# Patient Record
Sex: Female | Born: 1965 | Race: White | Hispanic: No | Marital: Married | Smoking: Never smoker
Health system: Southern US, Community
[De-identification: ages and names within clinical notes are randomized; demographics above are authoritative.]

## PROBLEM LIST (undated history)

## (undated) DIAGNOSIS — Z87442 Personal history of urinary calculi: Secondary | ICD-10-CM

## (undated) DIAGNOSIS — B009 Herpesviral infection, unspecified: Secondary | ICD-10-CM

## (undated) DIAGNOSIS — Z9889 Other specified postprocedural states: Secondary | ICD-10-CM

## (undated) DIAGNOSIS — D649 Anemia, unspecified: Secondary | ICD-10-CM

## (undated) DIAGNOSIS — R112 Nausea with vomiting, unspecified: Secondary | ICD-10-CM

## (undated) DIAGNOSIS — T7840XA Allergy, unspecified, initial encounter: Secondary | ICD-10-CM

## (undated) DIAGNOSIS — N6092 Unspecified benign mammary dysplasia of left breast: Secondary | ICD-10-CM

## (undated) HISTORY — PX: TUBAL LIGATION: SHX77

## (undated) HISTORY — PX: EYE SURGERY: SHX253

## (undated) HISTORY — DX: Allergy, unspecified, initial encounter: T78.40XA

## (undated) HISTORY — DX: Herpesviral infection, unspecified: B00.9

---

## 2001-11-13 ENCOUNTER — Ambulatory Visit (HOSPITAL_COMMUNITY): Admission: RE | Admit: 2001-11-13 | Discharge: 2001-11-13 | Payer: Self-pay | Admitting: Neurosurgery

## 2001-11-13 ENCOUNTER — Encounter: Payer: Self-pay | Admitting: Neurosurgery

## 2016-02-24 ENCOUNTER — Other Ambulatory Visit: Payer: Self-pay | Admitting: Obstetrics and Gynecology

## 2016-02-24 DIAGNOSIS — R928 Other abnormal and inconclusive findings on diagnostic imaging of breast: Secondary | ICD-10-CM

## 2016-02-27 ENCOUNTER — Other Ambulatory Visit: Payer: Self-pay | Admitting: Obstetrics and Gynecology

## 2016-02-27 ENCOUNTER — Ambulatory Visit
Admission: RE | Admit: 2016-02-27 | Discharge: 2016-02-27 | Disposition: A | Discharge status: Home / Self Care | Payer: 59 | Source: Ambulatory Visit | Attending: Obstetrics and Gynecology | Admitting: Obstetrics and Gynecology

## 2016-02-27 DIAGNOSIS — R928 Other abnormal and inconclusive findings on diagnostic imaging of breast: Secondary | ICD-10-CM

## 2016-02-27 IMAGING — MG DIGITAL DIAGNOSTIC UNILATERAL LEFT MAMMOGRAM
4 series · 4 of 4 positions shown · non-contrast
Comparison: Previous exam(s).

CLINICAL DATA: Left breast calcifications seen on most recent
screening mammography.

EXAM:
DIGITAL DIAGNOSTIC LEFT MAMMOGRAM WITH CAD

[L ML (1 of 3)]
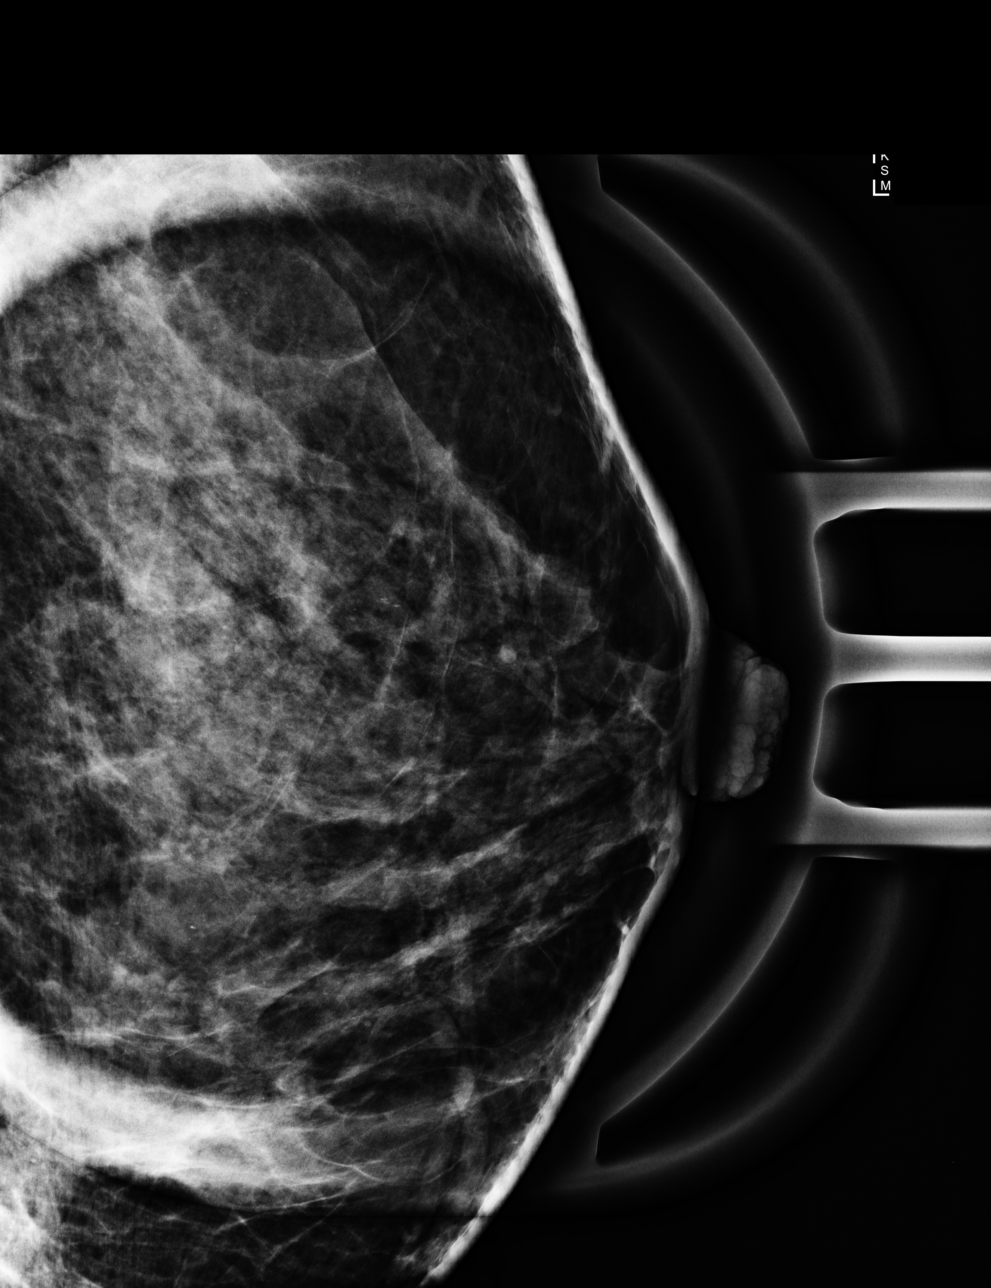

[L ML (2 of 3)]
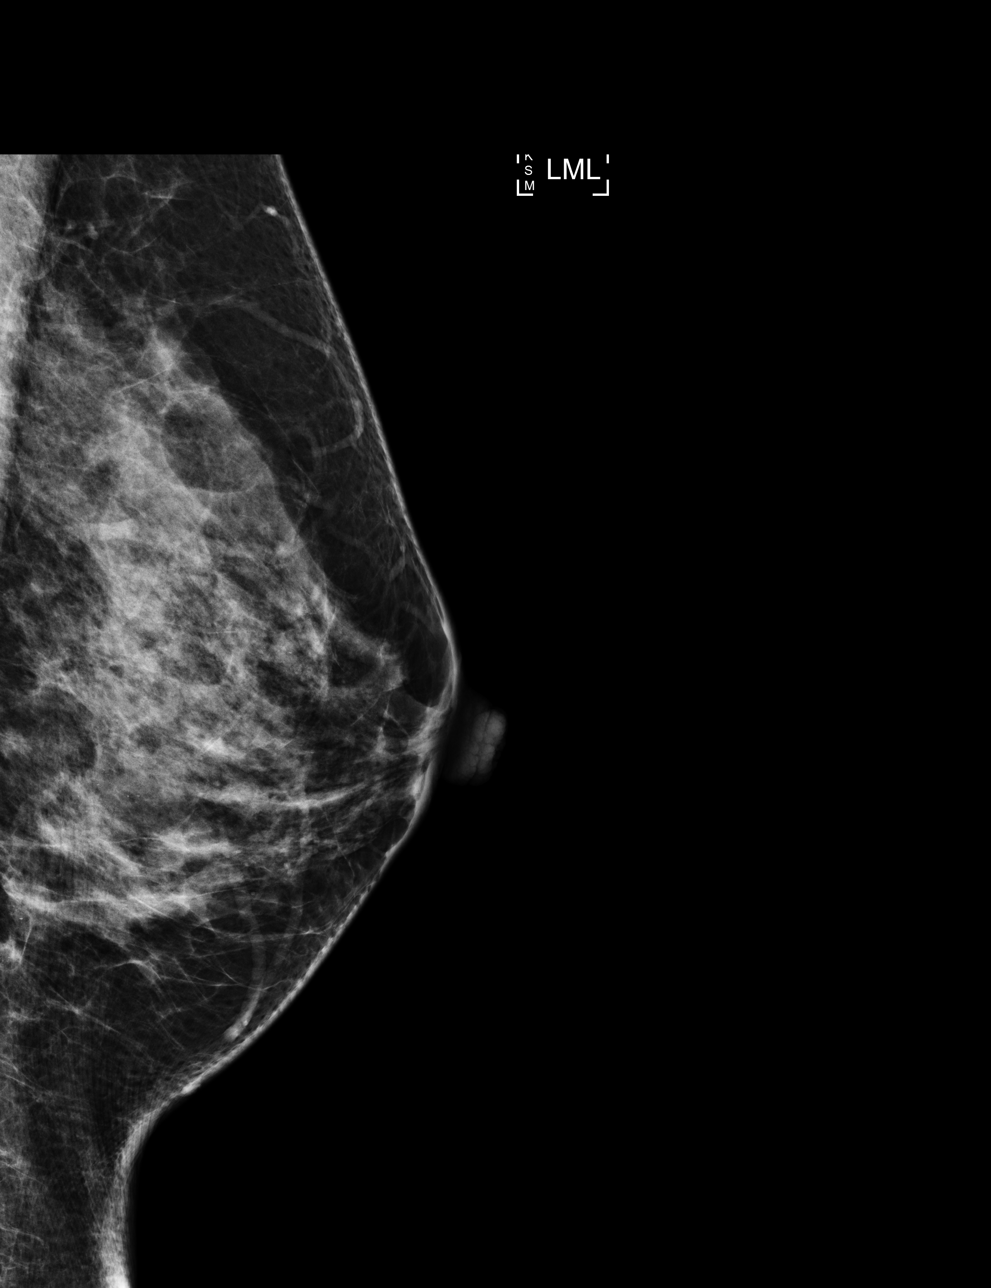

[L ML (3 of 3)]
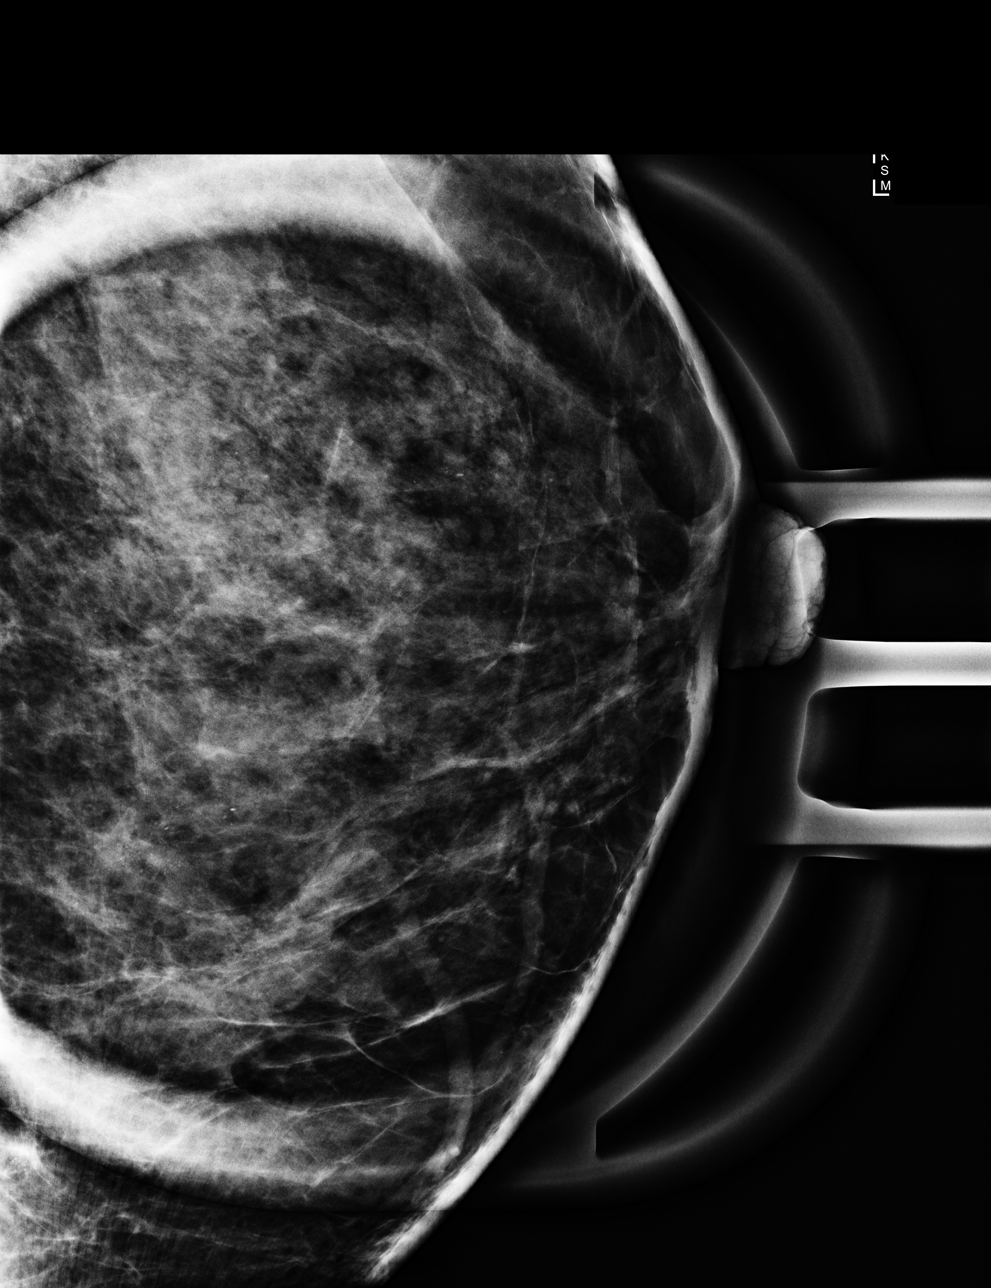

[L CC]
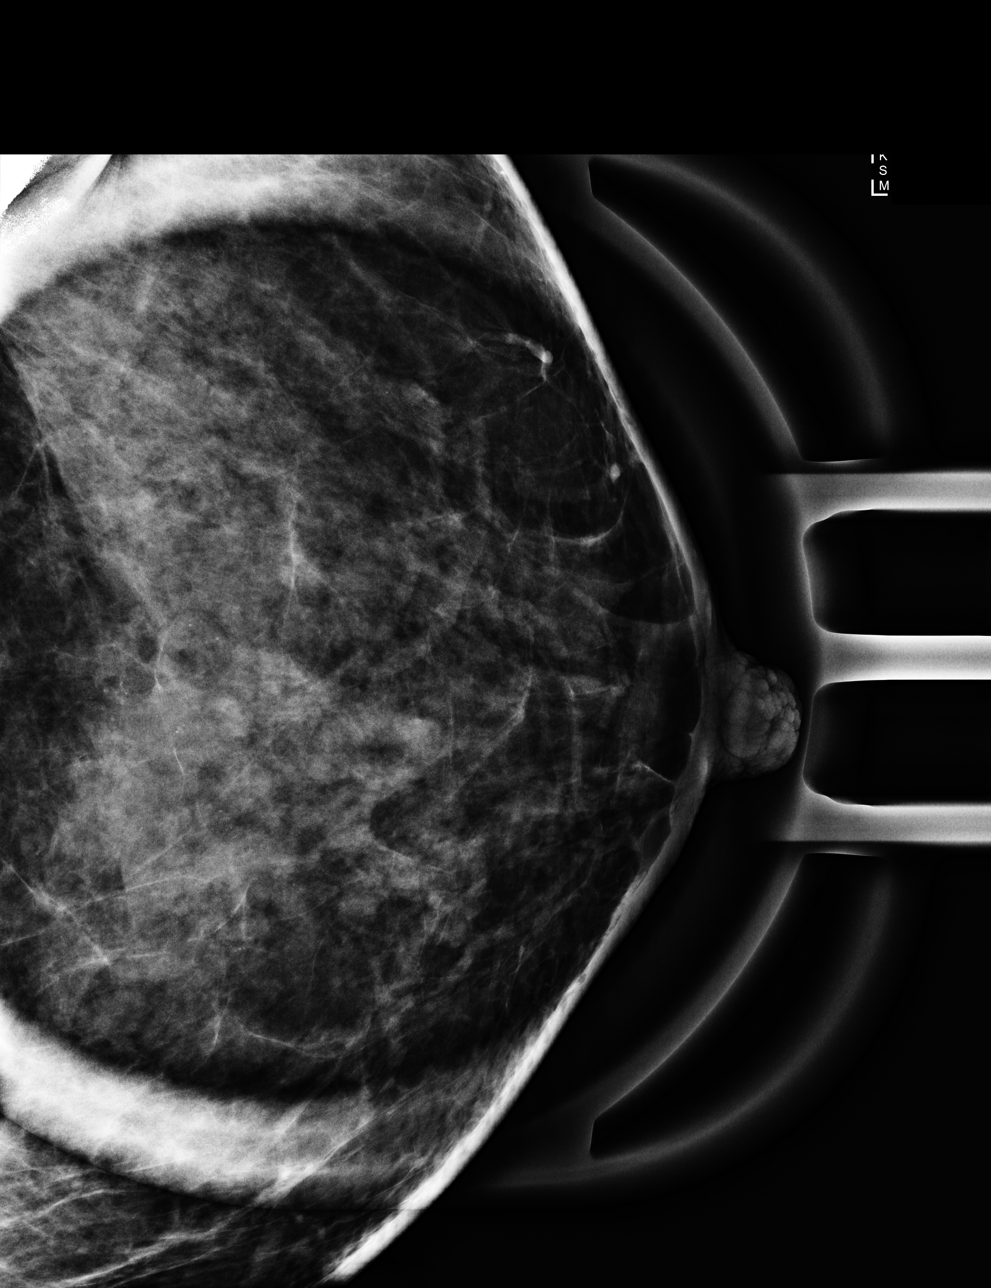

[4 of 4 positions shown; findings below may reference images not displayed]

ACR Breast Density Category c: The breast tissue is heterogeneously
dense, which may obscure small masses.
FINDINGS: Additional compression magnification views of the left breast
demonstrate a group of amorphous slightly pleomorphic calcifications
in the left lower central breast, posterior depth, which measures
2.6 by 2.6 by 2.5 cm. No associated mass is seen.

Mammographic images were processed with CAD.
IMPRESSION: Left lower central breast group of indeterminate calcifications, for
which stereotactic core needle biopsy is recommended.

RECOMMENDATION:
Stereotactic core needle biopsy of the left breast.

I have discussed the findings and recommendations with the patient.
Results were also provided in writing at the conclusion of the
visit. If applicable, a reminder letter will be sent to the patient
regarding the next appointment.

BI-RADS CATEGORY  4: Suspicious.

## 2016-03-02 ENCOUNTER — Ambulatory Visit
Admission: RE | Admit: 2016-03-02 | Discharge: 2016-03-02 | Disposition: A | Discharge status: Home / Self Care | Payer: 59 | Source: Ambulatory Visit | Attending: Obstetrics and Gynecology | Admitting: Obstetrics and Gynecology

## 2016-03-02 ENCOUNTER — Other Ambulatory Visit: Payer: Self-pay | Admitting: Obstetrics and Gynecology

## 2016-03-02 DIAGNOSIS — R928 Other abnormal and inconclusive findings on diagnostic imaging of breast: Secondary | ICD-10-CM

## 2016-03-02 DIAGNOSIS — R921 Mammographic calcification found on diagnostic imaging of breast: Secondary | ICD-10-CM

## 2016-03-02 IMAGING — MG STEREOTACTIC CORE NEEDLE BIOPSY
8 series · 8 of 16 positions shown · non-contrast
Comparison: Previous exams.

ADDENDUM:
Pathology revealed ATYPICAL LOBULAR HYPERPLASIA, FLAT EPITHELIAL
ATYPIA WITH ASSOCIATED CALCIFICATIONS, FIBROCYSTIC CHANGES WITH
ASSOCIATED CALCIFICATIONS of the Left breast, lower inner. This was
found to be concordant by Dr. SRETENA, with excision
recommended. Pathology results were discussed with the patient by
telephone. The patient reported doing well after the biopsy with
tenderness at the site. Post biopsy instructions and care were
reviewed and questions were answered. The patient was encouraged to
call The [REDACTED] for any additional
concerns. Surgical consultation has been arranged with Dr. SRETENA
at [REDACTED] on [DATE].

Pathology results reported by SRETENA, RN on [DATE].
CLINICAL DATA: 50-year-old female with indeterminate left breast
calcifications.
EXAM:
BREAST STEREOTACTIC CORE NEEDLE BIOPSY

[L (1 of 5)]
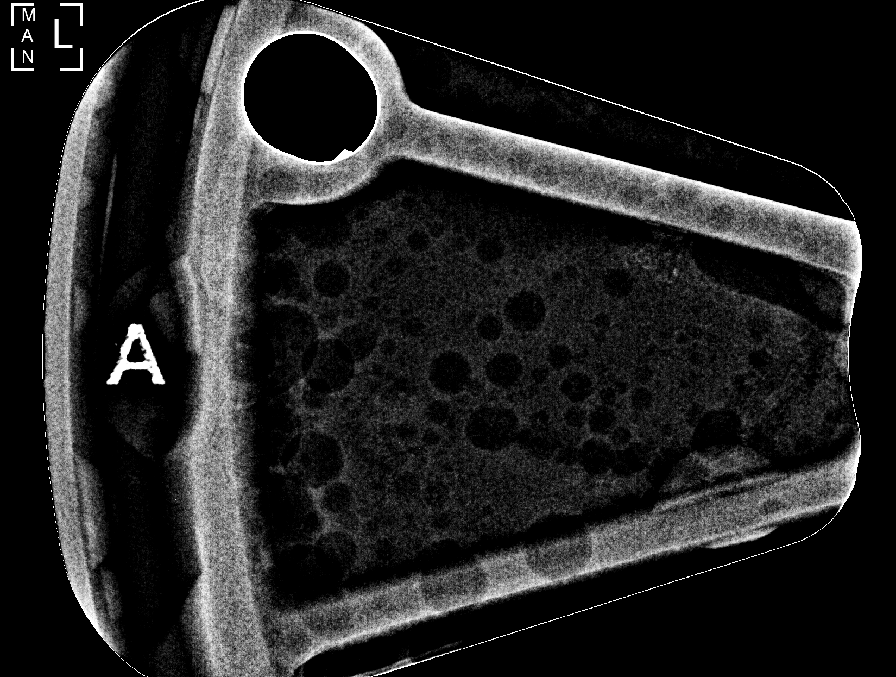

[L (2 of 5)]
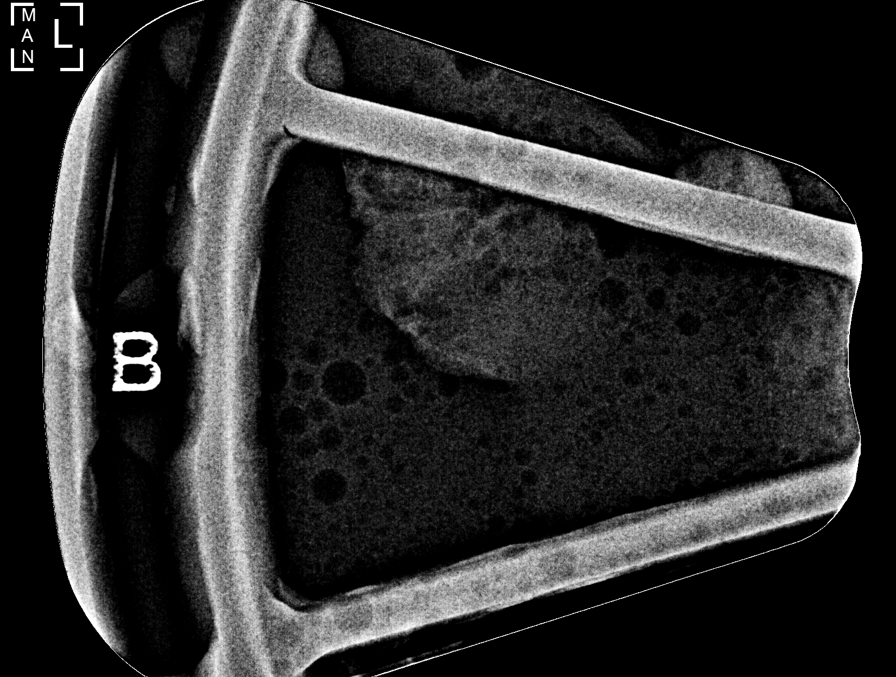

[L (3 of 5)]
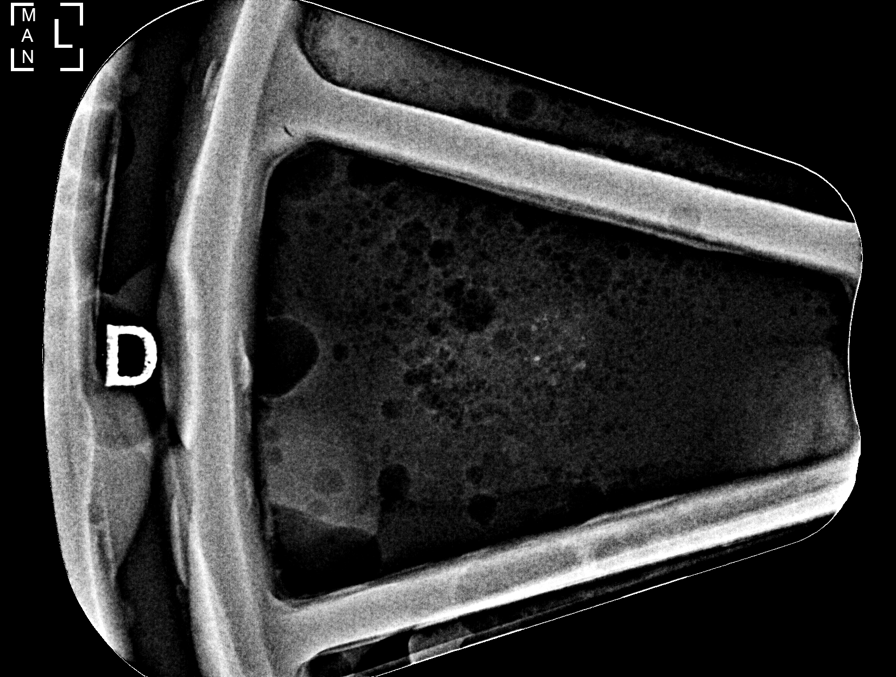

[L (4 of 5)]
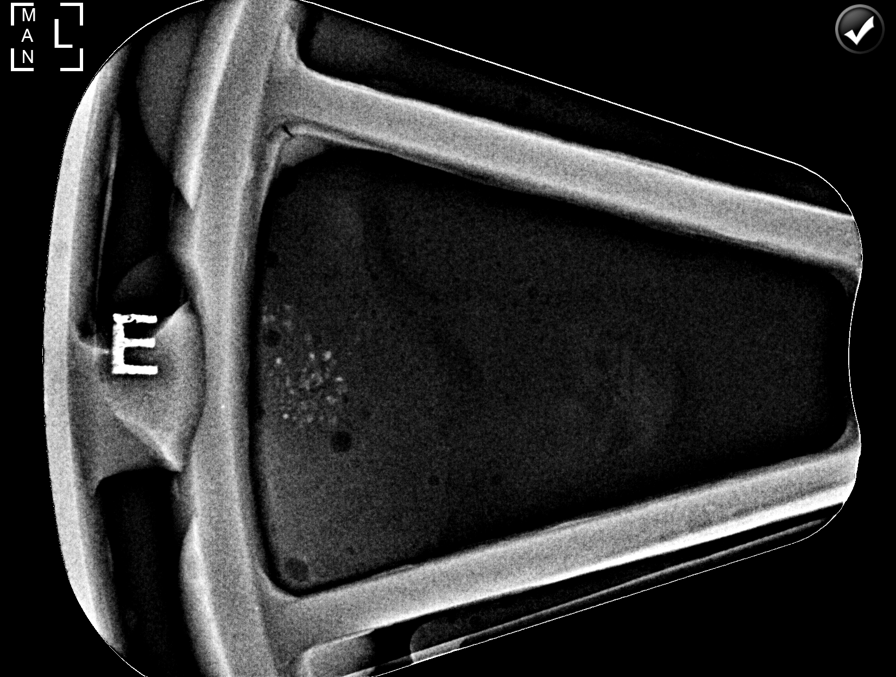

[L (5 of 5)]
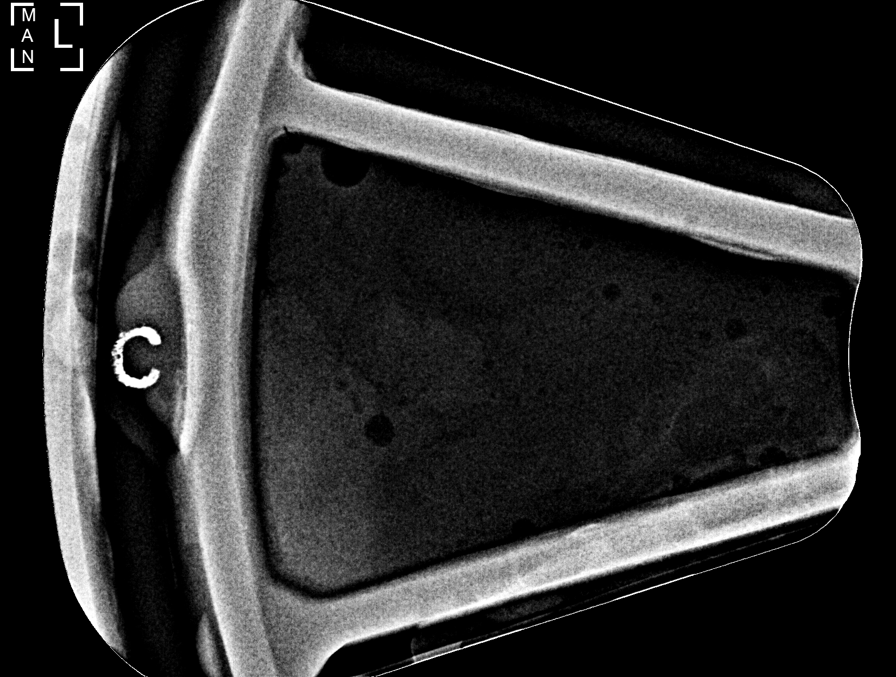

[L ML]
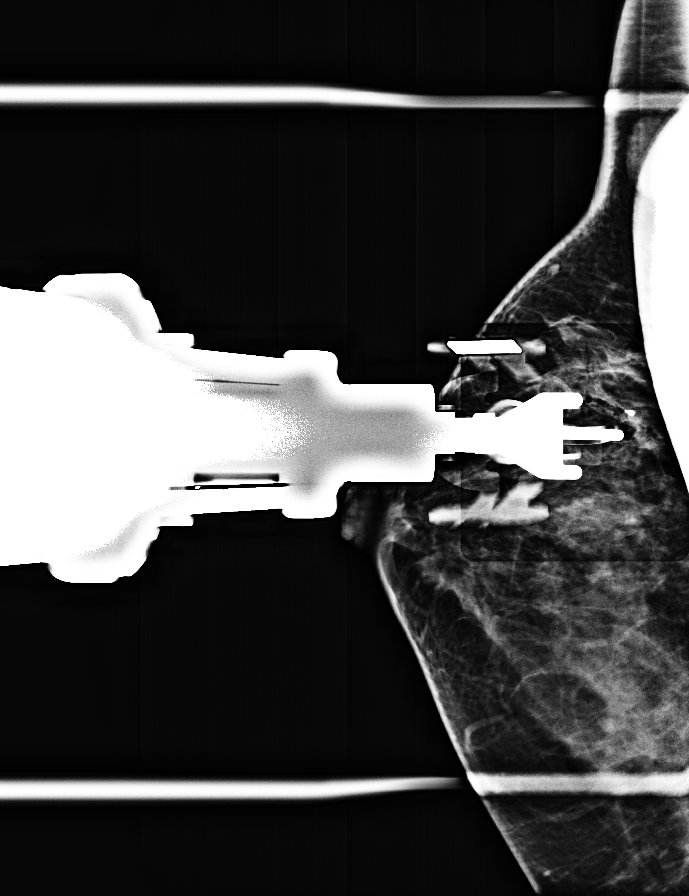

[L ML tomo (1 of 2) · tomo slice 35/68.0]
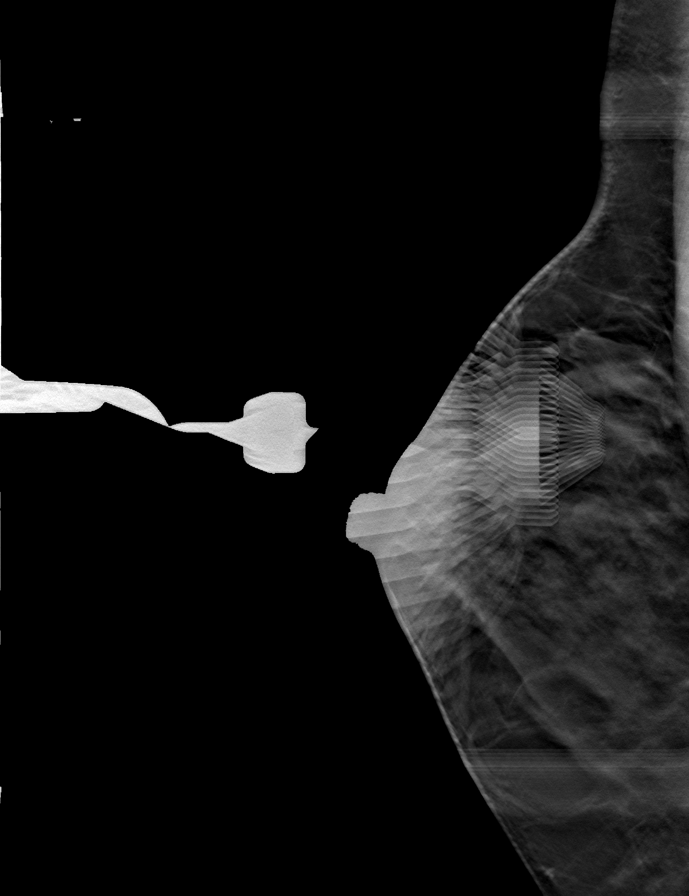

[L ML tomo (2 of 2) · tomo slice 35/69.0]
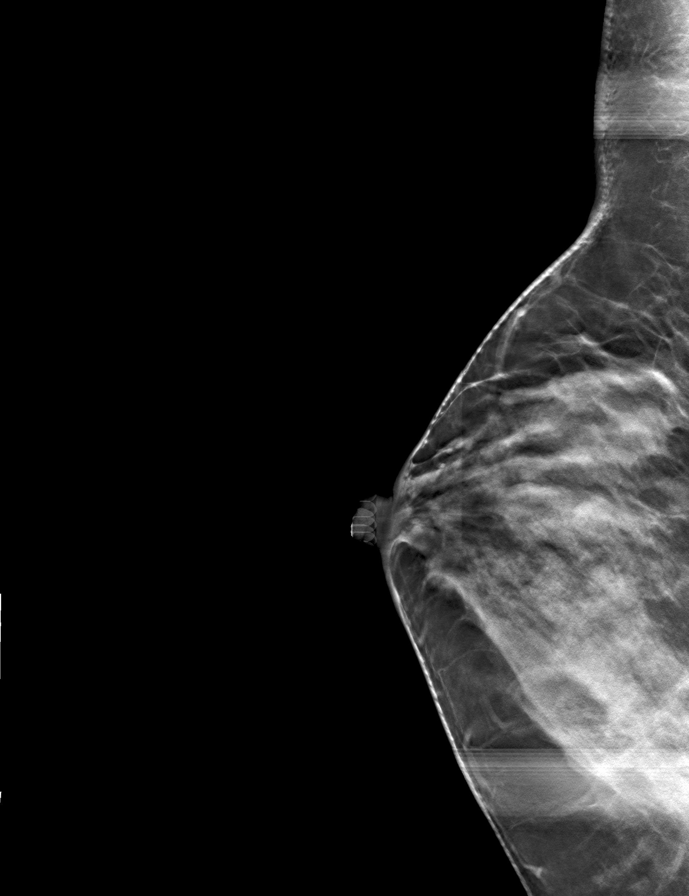

[8 of 16 positions shown; findings below may reference images not displayed]



Using sterile technique and 1% Lidocaine as local anesthetic, under
stereotactic guidance, a 9 gauge vacuum device was used to perform
core needle biopsy of the calcifications in the lower to slightly
inner left breast using a medial to lateral approach. Specimen
radiograph was performed showing the presence of calcifications.
Specimens with calcifications are identified for pathology.

At the conclusion of the procedure, a coil shaped tissue marker clip
was deployed into the biopsy cavity. Follow-up 2-view mammogram was
performed and dictated separately.
IMPRESSION: Stereotactic-guided biopsy of calcifications in the lower slightly
inner left breast. No apparent complications.

## 2016-03-10 ENCOUNTER — Ambulatory Visit: Payer: Self-pay | Admitting: General Surgery

## 2016-03-10 DIAGNOSIS — N6092 Unspecified benign mammary dysplasia of left breast: Secondary | ICD-10-CM

## 2016-03-15 ENCOUNTER — Other Ambulatory Visit: Payer: Self-pay | Admitting: General Surgery

## 2016-03-15 DIAGNOSIS — N6092 Unspecified benign mammary dysplasia of left breast: Secondary | ICD-10-CM

## 2016-03-16 ENCOUNTER — Encounter (HOSPITAL_BASED_OUTPATIENT_CLINIC_OR_DEPARTMENT_OTHER): Payer: Self-pay | Admitting: *Deleted

## 2016-03-19 ENCOUNTER — Ambulatory Visit
Admission: RE | Admit: 2016-03-19 | Discharge: 2016-03-19 | Disposition: A | Discharge status: Home / Self Care | Payer: 59 | Source: Ambulatory Visit | Attending: General Surgery | Admitting: General Surgery

## 2016-03-19 DIAGNOSIS — N6092 Unspecified benign mammary dysplasia of left breast: Secondary | ICD-10-CM

## 2016-03-19 IMAGING — MG NEEDLE LOCALIZATION OF THE LEFT BREAST WITH MAMMO GUIDANCE
7 series · 7 of 7 positions shown · non-contrast
Comparison: Previous exam(s).

CLINICAL DATA: Patient presents for radioactive seed localization
of ALH/NUUDHANO lower central left breast prior to surgical excision.
Surgery scheduled for [DATE].

EXAM:
MAMMOGRAPHIC GUIDED RADIOACTIVE SEED LOCALIZATION OF THE LEFT BREAST

[L LM (1 of 4)]
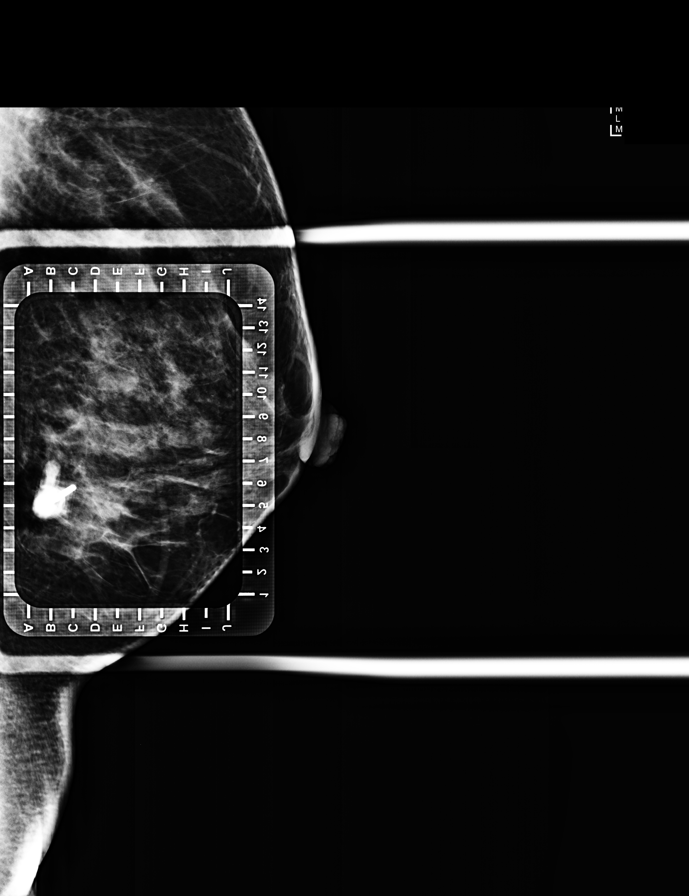

[L LM (2 of 4)]
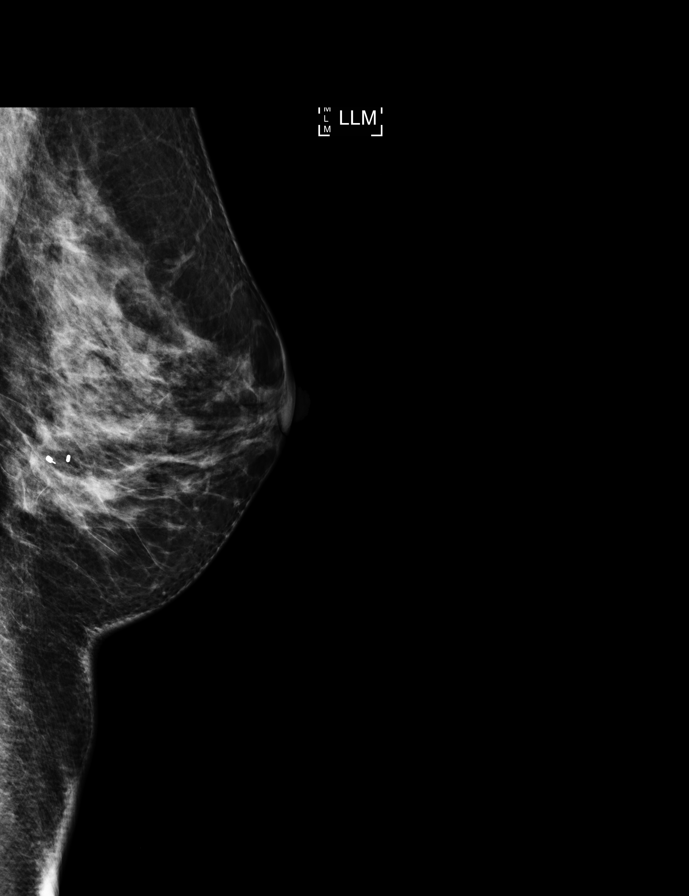

[L CC (1 of 3)]
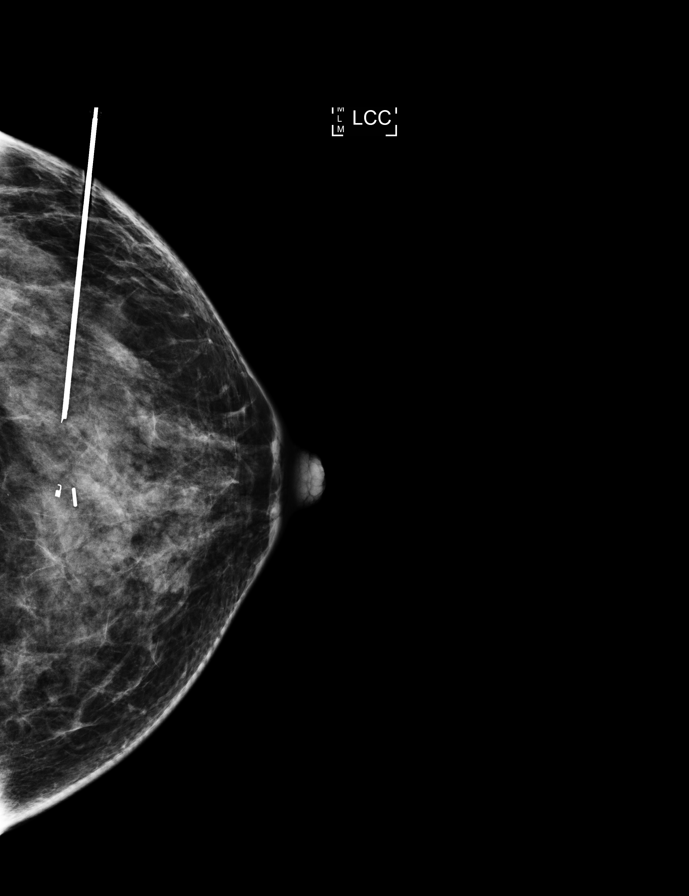

[L LM (3 of 4)]
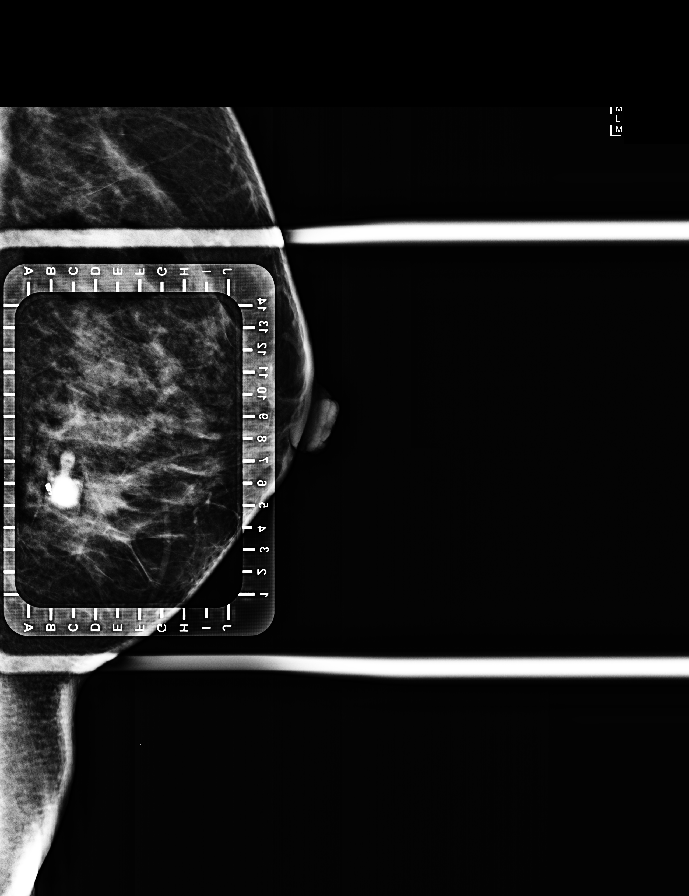

[L LM (4 of 4)]
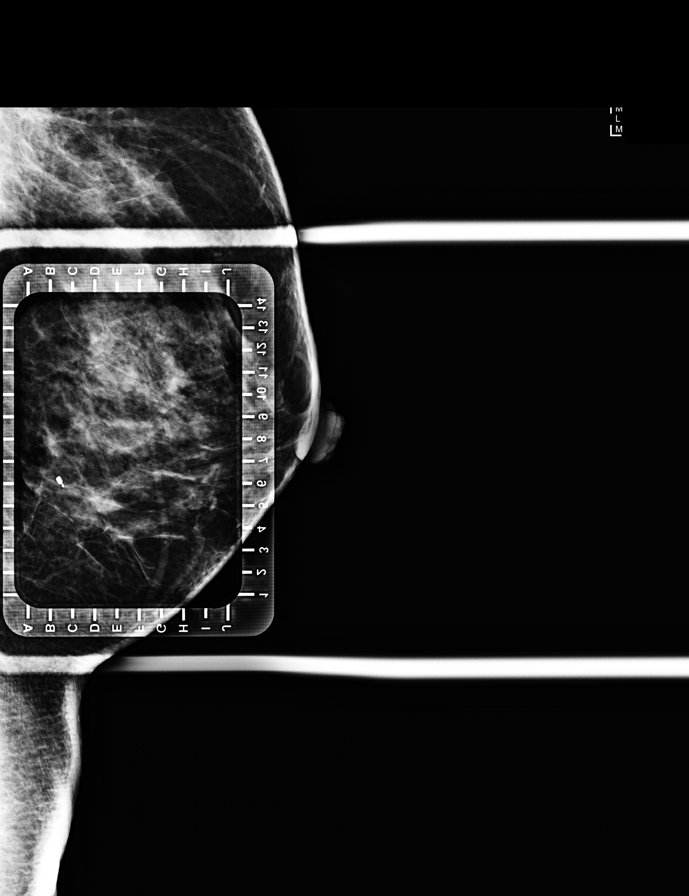

[L CC (2 of 3)]
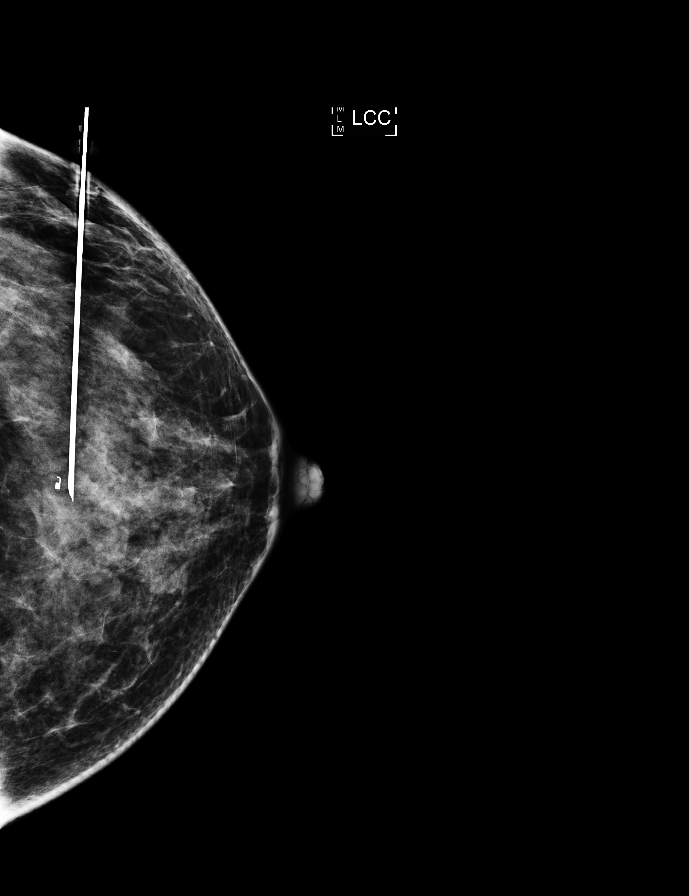

[L CC (3 of 3)]
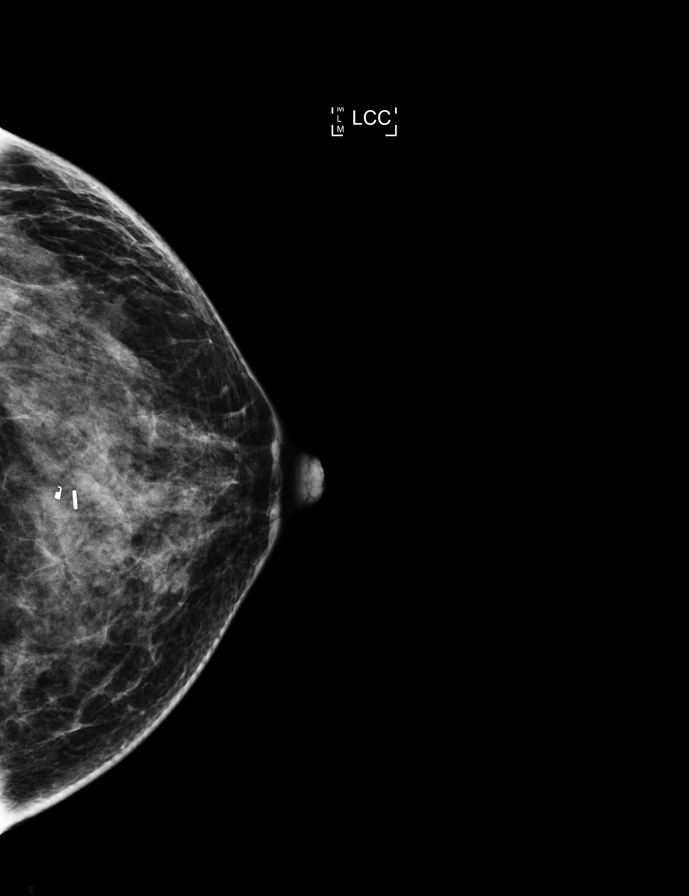

[7 of 7 positions shown; findings below may reference images not displayed]

FINDINGS: Patient presents for radioactive seed localization prior to surgical
excision. I met with the patient and we discussed the procedure of
seed localization including benefits and alternatives. We discussed
the high likelihood of a successful procedure. We discussed the
risks of the procedure including infection, bleeding, tissue injury
and further surgery. We discussed the low dose of radioactivity
involved in the procedure. Informed, written consent was given.

The usual time-out protocol was performed immediately prior to the
procedure.

Using mammographic guidance, sterile technique, 1% lidocaine and an
[LI] radioactive seed, the targeted post biopsy clip was localized
using a lateral to medial approach. The follow-up mammogram images
confirm the seed in the expected location and were marked for Dr.
NUUDHANO. The radioactive seed lies immediately anterior to the targeted
clip.

Follow-up survey of the patient confirms presence of the radioactive
seed.

Order number of [LI] seed:  [PHONE_NUMBER].

Total activity:  0.248 millicuries  Reference Date: [DATE]

The patient tolerated the procedure well and was released from the
[REDACTED]. She was given instructions regarding seed removal.
IMPRESSION: Radioactive seed localization left breast. No apparent
complications.

## 2016-03-19 NOTE — Progress Notes (Signed)
Pt instructed to drink Boost before 0815 day of surgery with teach back method.

## 2016-03-22 ENCOUNTER — Ambulatory Visit (HOSPITAL_BASED_OUTPATIENT_CLINIC_OR_DEPARTMENT_OTHER): Payer: 59 | Admitting: Certified Registered"

## 2016-03-22 ENCOUNTER — Ambulatory Visit (HOSPITAL_BASED_OUTPATIENT_CLINIC_OR_DEPARTMENT_OTHER)
Admission: RE | Admit: 2016-03-22 | Discharge: 2016-03-22 | Disposition: A | Discharge status: Home / Self Care | Payer: 59 | Source: Ambulatory Visit | Attending: General Surgery | Admitting: General Surgery

## 2016-03-22 ENCOUNTER — Ambulatory Visit
Admission: RE | Admit: 2016-03-22 | Discharge: 2016-03-22 | Disposition: A | Discharge status: Home / Self Care | Payer: 59 | Source: Ambulatory Visit | Attending: General Surgery | Admitting: General Surgery

## 2016-03-22 ENCOUNTER — Encounter (HOSPITAL_BASED_OUTPATIENT_CLINIC_OR_DEPARTMENT_OTHER)
Admission: RE | Disposition: A | Discharge status: Home / Self Care | Payer: Self-pay | Source: Ambulatory Visit | Attending: General Surgery

## 2016-03-22 ENCOUNTER — Encounter (HOSPITAL_BASED_OUTPATIENT_CLINIC_OR_DEPARTMENT_OTHER): Payer: Self-pay

## 2016-03-22 DIAGNOSIS — Z803 Family history of malignant neoplasm of breast: Secondary | ICD-10-CM | POA: Diagnosis not present

## 2016-03-22 DIAGNOSIS — N6092 Unspecified benign mammary dysplasia of left breast: Secondary | ICD-10-CM | POA: Diagnosis present

## 2016-03-22 DIAGNOSIS — Z79899 Other long term (current) drug therapy: Secondary | ICD-10-CM | POA: Diagnosis not present

## 2016-03-22 HISTORY — DX: Unspecified benign mammary dysplasia of left breast: N60.92

## 2016-03-22 HISTORY — PX: BREAST LUMPECTOMY WITH RADIOACTIVE SEED LOCALIZATION: SHX6424

## 2016-03-22 HISTORY — DX: Anemia, unspecified: D64.9

## 2016-03-22 HISTORY — PX: BREAST CYST EXCISION: SHX579

## 2016-03-22 LAB — POCT HEMOGLOBIN-HEMACUE: Hemoglobin: 12.5 g/dL (ref 12.0–15.0)

## 2016-03-22 IMAGING — MG BREAST SURGICAL SPECIMEN
1 series · 1 of 1 positions shown · non-contrast
Comparison: Previous exam(s).

CLINICAL DATA: Patient status post left breast excisional biopsy.

EXAM:
SPECIMEN RADIOGRAPH OF THE LEFT BREAST

[L]
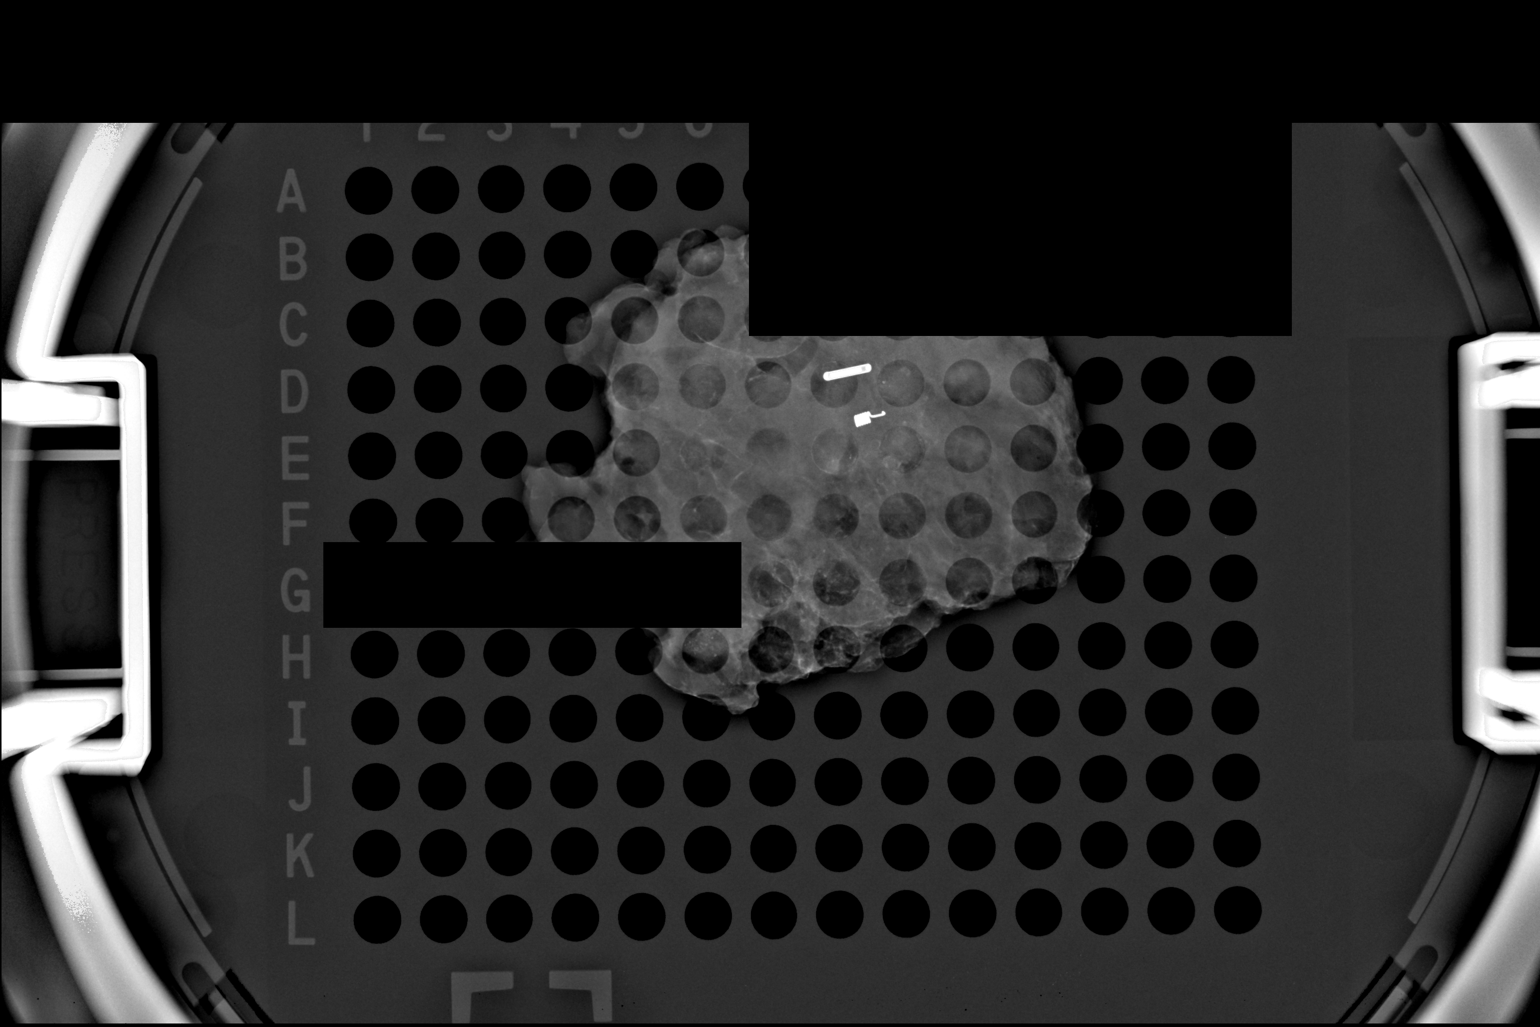

[1 of 1 positions shown; findings below may reference images not displayed]

FINDINGS: Status post excision of the left breast. The radioactive seed and
biopsy marker clip are present, completely intact, and were marked
for pathology.
IMPRESSION: Specimen radiograph of the left breast.

## 2016-03-22 SURGERY — BREAST LUMPECTOMY WITH RADIOACTIVE SEED LOCALIZATION
Anesthesia: General | Site: Breast | Laterality: Left

## 2016-03-22 MED ORDER — CEFAZOLIN SODIUM-DEXTROSE 2-4 GM/100ML-% IV SOLN
2.0000 g | INTRAVENOUS | Status: AC
  Administered 2016-03-22: 2 g via INTRAVENOUS

## 2016-03-22 MED ORDER — ONDANSETRON HCL 4 MG/2ML IJ SOLN
INTRAMUSCULAR | Status: DC | PRN
  Administered 2016-03-22: 4 mg via INTRAVENOUS

## 2016-03-22 MED ORDER — OXYCODONE HCL 5 MG PO TABS: 5.0000 mg | ORAL_TABLET | Freq: Once | ORAL | Status: DC | PRN

## 2016-03-22 MED ORDER — GABAPENTIN 300 MG PO CAPS
300.0000 mg | ORAL_CAPSULE | ORAL | Status: AC
  Administered 2016-03-22: 300 mg via ORAL

## 2016-03-22 MED ORDER — MEPERIDINE HCL 25 MG/ML IJ SOLN: 6.2500 mg | INTRAMUSCULAR | Status: DC | PRN

## 2016-03-22 MED ORDER — SCOPOLAMINE 1 MG/3DAYS TD PT72
MEDICATED_PATCH | TRANSDERMAL | Status: AC
  Filled 2016-03-22: qty 1

## 2016-03-22 MED ORDER — PROPOFOL 10 MG/ML IV BOLUS
INTRAVENOUS | Status: AC
  Filled 2016-03-22: qty 20

## 2016-03-22 MED ORDER — ONDANSETRON HCL 4 MG/2ML IJ SOLN
INTRAMUSCULAR | Status: AC
  Filled 2016-03-22: qty 16

## 2016-03-22 MED ORDER — FENTANYL CITRATE (PF) 100 MCG/2ML IJ SOLN
INTRAMUSCULAR | Status: AC
  Filled 2016-03-22: qty 2

## 2016-03-22 MED ORDER — LIDOCAINE 2% (20 MG/ML) 5 ML SYRINGE
INTRAMUSCULAR | Status: AC
  Filled 2016-03-22: qty 20

## 2016-03-22 MED ORDER — MIDAZOLAM HCL 2 MG/2ML IJ SOLN
INTRAMUSCULAR | Status: AC
  Filled 2016-03-22: qty 2

## 2016-03-22 MED ORDER — OXYCODONE HCL 5 MG/5ML PO SOLN: 5.0000 mg | Freq: Once | ORAL | Status: DC | PRN

## 2016-03-22 MED ORDER — PROMETHAZINE HCL 25 MG/ML IJ SOLN: 6.2500 mg | INTRAMUSCULAR | Status: DC | PRN

## 2016-03-22 MED ORDER — HYDROMORPHONE HCL 1 MG/ML IJ SOLN
0.2500 mg | INTRAMUSCULAR | Status: DC | PRN
  Administered 2016-03-22 (×2): 0.5 mg via INTRAVENOUS

## 2016-03-22 MED ORDER — HYDROCODONE-ACETAMINOPHEN 5-325 MG PO TABS: 1.0000 | ORAL_TABLET | ORAL | 0 refills | Status: DC | PRN

## 2016-03-22 MED ORDER — HYDROMORPHONE HCL 1 MG/ML IJ SOLN
INTRAMUSCULAR | Status: AC
  Filled 2016-03-22: qty 1

## 2016-03-22 MED ORDER — CEFAZOLIN SODIUM-DEXTROSE 2-4 GM/100ML-% IV SOLN
INTRAVENOUS | Status: AC
  Filled 2016-03-22: qty 100

## 2016-03-22 MED ORDER — SCOPOLAMINE 1 MG/3DAYS TD PT72
1.0000 | MEDICATED_PATCH | Freq: Once | TRANSDERMAL | Status: DC | PRN
  Administered 2016-03-22: 1.5 mg via TRANSDERMAL

## 2016-03-22 MED ORDER — DEXAMETHASONE SODIUM PHOSPHATE 4 MG/ML IJ SOLN
INTRAMUSCULAR | Status: DC | PRN
  Administered 2016-03-22: 10 mg via INTRAVENOUS

## 2016-03-22 MED ORDER — GABAPENTIN 300 MG PO CAPS
ORAL_CAPSULE | ORAL | Status: AC
  Filled 2016-03-22: qty 1

## 2016-03-22 MED ORDER — PROPOFOL 500 MG/50ML IV EMUL
INTRAVENOUS | Status: AC
  Filled 2016-03-22: qty 50

## 2016-03-22 MED ORDER — PROPOFOL 10 MG/ML IV BOLUS
INTRAVENOUS | Status: DC | PRN
  Administered 2016-03-22: 200 mg via INTRAVENOUS

## 2016-03-22 MED ORDER — BUPIVACAINE HCL (PF) 0.25 % IJ SOLN
INTRAMUSCULAR | Status: DC | PRN
  Administered 2016-03-22 (×2): 10 mL

## 2016-03-22 MED ORDER — LACTATED RINGERS IV SOLN
INTRAVENOUS | Status: DC
  Administered 2016-03-22 (×3): via INTRAVENOUS

## 2016-03-22 MED ORDER — CHLORHEXIDINE GLUCONATE CLOTH 2 % EX PADS: 6.0000 | MEDICATED_PAD | Freq: Once | CUTANEOUS | Status: DC

## 2016-03-22 MED ORDER — ACETAMINOPHEN 500 MG PO TABS
ORAL_TABLET | ORAL | Status: AC
  Filled 2016-03-22: qty 2

## 2016-03-22 MED ORDER — LIDOCAINE HCL (CARDIAC) 20 MG/ML IV SOLN
INTRAVENOUS | Status: DC | PRN
  Administered 2016-03-22: 60 mg via INTRAVENOUS

## 2016-03-22 MED ORDER — MIDAZOLAM HCL 2 MG/2ML IJ SOLN
1.0000 mg | INTRAMUSCULAR | Status: DC | PRN
  Administered 2016-03-22: 2 mg via INTRAVENOUS

## 2016-03-22 MED ORDER — CELECOXIB 400 MG PO CAPS: 400.0000 mg | ORAL_CAPSULE | ORAL | Status: DC

## 2016-03-22 MED ORDER — FENTANYL CITRATE (PF) 100 MCG/2ML IJ SOLN
50.0000 ug | INTRAMUSCULAR | Status: DC | PRN
  Administered 2016-03-22 (×2): 50 ug via INTRAVENOUS

## 2016-03-22 MED ORDER — LACTATED RINGERS IV SOLN: INTRAVENOUS | Status: DC

## 2016-03-22 MED ORDER — DEXAMETHASONE SODIUM PHOSPHATE 10 MG/ML IJ SOLN
INTRAMUSCULAR | Status: AC
  Filled 2016-03-22: qty 3

## 2016-03-22 MED ORDER — ACETAMINOPHEN 500 MG PO TABS
1000.0000 mg | ORAL_TABLET | ORAL | Status: AC
  Administered 2016-03-22: 1000 mg via ORAL

## 2016-03-22 SURGICAL SUPPLY — 45 items
ADH SKN CLS APL DERMABOND .7 (GAUZE/BANDAGES/DRESSINGS) ×1
APPLIER CLIP 9.375 MED OPEN (MISCELLANEOUS)
APR CLP MED 9.3 20 MLT OPN (MISCELLANEOUS)
BLADE SURG 15 STRL LF DISP TIS (BLADE) ×1 IMPLANT
BLADE SURG 15 STRL SS (BLADE) ×3
CANISTER SUC SOCK COL 7IN (MISCELLANEOUS) ×3 IMPLANT
CANISTER SUCT 1200ML W/VALVE (MISCELLANEOUS) ×3 IMPLANT
CHLORAPREP W/TINT 26ML (MISCELLANEOUS) ×3 IMPLANT
CLIP APPLIE 9.375 MED OPEN (MISCELLANEOUS) IMPLANT
COVER BACK TABLE 60X90IN (DRAPES) ×3 IMPLANT
COVER MAYO STAND STRL (DRAPES) ×3 IMPLANT
COVER PROBE W GEL 5X96 (DRAPES) ×3 IMPLANT
DECANTER SPIKE VIAL GLASS SM (MISCELLANEOUS) IMPLANT
DERMABOND ADVANCED (GAUZE/BANDAGES/DRESSINGS) ×2
DERMABOND ADVANCED .7 DNX12 (GAUZE/BANDAGES/DRESSINGS) ×1 IMPLANT
DEVICE DUBIN W/COMP PLATE 8390 (MISCELLANEOUS) ×3 IMPLANT
DRAPE LAPAROSCOPIC ABDOMINAL (DRAPES) ×2 IMPLANT
DRAPE UTILITY XL STRL (DRAPES) ×3 IMPLANT
ELECT COATED BLADE 2.86 ST (ELECTRODE) ×3 IMPLANT
ELECT REM PT RETURN 9FT ADLT (ELECTROSURGICAL) ×3
ELECTRODE REM PT RTRN 9FT ADLT (ELECTROSURGICAL) ×1 IMPLANT
GLOVE BIO SURGEON STRL SZ 6.5 (GLOVE) ×1 IMPLANT
GLOVE BIO SURGEON STRL SZ7.5 (GLOVE) ×6 IMPLANT
GLOVE BIO SURGEONS STRL SZ 6.5 (GLOVE) ×1
GOWN STRL REUS W/ TWL LRG LVL3 (GOWN DISPOSABLE) ×2 IMPLANT
GOWN STRL REUS W/TWL LRG LVL3 (GOWN DISPOSABLE) ×6
ILLUMINATOR WAVEGUIDE N/F (MISCELLANEOUS) IMPLANT
KIT MARKER MARGIN INK (KITS) ×3 IMPLANT
LIGHT WAVEGUIDE WIDE FLAT (MISCELLANEOUS) ×2 IMPLANT
NDL HYPO 25X1 1.5 SAFETY (NEEDLE) IMPLANT
NEEDLE HYPO 25X1 1.5 SAFETY (NEEDLE) ×3 IMPLANT
NS IRRIG 1000ML POUR BTL (IV SOLUTION) ×2 IMPLANT
PACK BASIN DAY SURGERY FS (CUSTOM PROCEDURE TRAY) ×3 IMPLANT
PENCIL BUTTON HOLSTER BLD 10FT (ELECTRODE) ×3 IMPLANT
SLEEVE SCD COMPRESS KNEE MED (MISCELLANEOUS) ×3 IMPLANT
SPONGE LAP 18X18 X RAY DECT (DISPOSABLE) ×3 IMPLANT
SUT MON AB 4-0 PC3 18 (SUTURE) ×2 IMPLANT
SUT SILK 2 0 SH (SUTURE) IMPLANT
SUT VICRYL 3-0 CR8 SH (SUTURE) ×3 IMPLANT
SYR CONTROL 10ML LL (SYRINGE) ×2 IMPLANT
TOWEL OR 17X24 6PK STRL BLUE (TOWEL DISPOSABLE) ×3 IMPLANT
TOWEL OR NON WOVEN STRL DISP B (DISPOSABLE) ×1 IMPLANT
TUBE CONNECTING 20'X1/4 (TUBING) ×1
TUBE CONNECTING 20X1/4 (TUBING) ×2 IMPLANT
YANKAUER SUCT BULB TIP NO VENT (SUCTIONS) ×2 IMPLANT

## 2016-03-22 NOTE — Op Note (Signed)
03/22/2016  1:05 PM  PATIENT:  Kim Archer  51 y.o. female  PRE-OPERATIVE DIAGNOSIS:  ATYPICAL DUCTAL HYPERPLASIA OF LEFT BREAST  POST-OPERATIVE DIAGNOSIS:  ATYPICAL DUCTAL HYPERPLASIA OF LEFT BREAST  PROCEDURE:  Procedure(s): LEFT BREAST LUMPECTOMY WITH RADIOACTIVE SEED LOCALIZATION (Left)  SURGEON:  Surgeon(s) and Role:    * Jovita Kussmaul, MD - Primary  PHYSICIAN ASSISTANT:   ASSISTANTS: none   ANESTHESIA:   local and general  EBL:  Total I/O In: 1100 [I.V.:1100] Out: -   BLOOD ADMINISTERED:none  DRAINS: none   LOCAL MEDICATIONS USED:  MARCAINE     SPECIMEN:  Source of Specimen:  left breast tissue  DISPOSITION OF SPECIMEN:  PATHOLOGY  COUNTS:  YES  TOURNIQUET:  * No tourniquets in log *  DICTATION: .Dragon Dictation   After informed consent was obtained the patient was brought to the operating room and placed in the supine position on the operating room table. After adequate induction of general anesthesia the patient's left breast was prepped with ChloraPrep, allowed to dry, and draped in usual sterile manner. Timeout was performed. Previously an I-125 seed was placed in the lower portion of the left breast to mark an area of atypical lobular hyperplasia. The neoprobe was sent to I-125 in the area of radioactivity was readily identified in the 6:00 position of the left breast. The area around this was infiltrated with quarter percent Marcaine. A transversely oriented inframammary incision was made with a 15 blade knife. The incision was carried through the skin and subcutaneous tissue sharply with electrocautery. The dissection was then carried superiorly between the breast tissue in the chest wall muscle until the dissection had gone beyond the area of the radioactive seed. Next the dissection was carried out between the breast and subcutaneous tissue anteriorly towards the seed. Once I approached the area of the radioactive seed then a circular portion of breast  tissue was excised sharply around the radioactive seed with the electrocautery. This dissection was carried down to the chest wall where the dissection in order to been created. Once the specimen was removed it was oriented with the appropriate paint colors. A specimen radiograph was obtained that showed the clip and seed and calcifications to be near the center of the specimen. The specimen was then sent to pathology for further evaluation. The wound was irrigated with copious amounts of saline. The deep layer of the wound was then closed with layers of interrupted 3-0 Vicryl stitches. Skin was then closed with interrupted 4-0 Monocryl subcuticular stitches. Dermabond dressings were applied. The patient tolerated the procedure well. At the end of the case all needle sponge and instrument counts were correct. The patient was then awakened and taken to recovery in stable condition.  PLAN OF CARE: Discharge to home after PACU  PATIENT DISPOSITION:  PACU - hemodynamically stable.   Delay start of Pharmacological VTE agent (>24hrs) due to surgical blood loss or risk of bleeding: not applicable

## 2016-03-22 NOTE — Addendum Note (Signed)
Addendum  created 03/22/16 1410 by Ernesta Amble Martie Fulgham, CRNA   Anesthesia Intra Meds edited

## 2016-03-22 NOTE — Interval H&P Note (Signed)
History and Physical Interval Note:  03/22/2016 11:49 AM  Kim Archer  has presented today for surgery, with the diagnosis of ATYPICAL DUCTAL HYPERPLASIA OF LEFT BREAST  The various methods of treatment have been discussed with the patient and family. After consideration of risks, benefits and other options for treatment, the patient has consented to  Procedure(s): LEFT BREAST LUMPECTOMY WITH RADIOACTIVE SEED LOCALIZATION (Left) as a surgical intervention .  The patient's history has been reviewed, patient examined, no change in status, stable for surgery.  I have reviewed the patient's chart and labs.  Questions were answered to the patient's satisfaction.     TOTH III,Braidan Ricciardi S

## 2016-03-22 NOTE — Transfer of Care (Signed)
Immediate Anesthesia Transfer of Care Note  Patient: Kim Archer  Procedure(s) Performed: Procedure(s): LEFT BREAST LUMPECTOMY WITH RADIOACTIVE SEED LOCALIZATION (Left)  Patient Location: PACU  Anesthesia Type:General  Level of Consciousness: awake and patient cooperative  Airway & Oxygen Therapy: Patient Spontanous Breathing and Patient connected to face mask oxygen  Post-op Assessment: Report given to RN and Post -op Vital signs reviewed and stable  Post vital signs: Reviewed and stable  Last Vitals:  Vitals:   03/22/16 1037  BP: 121/72  Pulse: (!) 56  Resp: 18  Temp: 37.1 C    Last Pain:  Vitals:   03/22/16 1037  TempSrc: Oral         Complications: No apparent anesthesia complications

## 2016-03-22 NOTE — Anesthesia Preprocedure Evaluation (Addendum)
Anesthesia Evaluation  Patient identified by MRN, date of birth, ID band Patient awake    Reviewed: Allergy & Precautions, NPO status , Patient's Chart, lab work & pertinent test results  Airway Mallampati: II  TM Distance: >3 FB Neck ROM: Full    Dental  (+) Teeth Intact   Pulmonary neg pulmonary ROS,    breath sounds clear to auscultation       Cardiovascular negative cardio ROS   Rhythm:Regular Rate:Normal     Neuro/Psych negative neurological ROS  negative psych ROS   GI/Hepatic negative GI ROS, Neg liver ROS,   Endo/Other  negative endocrine ROS  Renal/GU negative Renal ROS  negative genitourinary   Musculoskeletal negative musculoskeletal ROS (+)   Abdominal   Peds negative pediatric ROS (+)  Hematology negative hematology ROS (+)   Anesthesia Other Findings   Reproductive/Obstetrics negative OB ROS                            Anesthesia Physical Anesthesia Plan  ASA: I  Anesthesia Plan: General   Post-op Pain Management:    Induction: Intravenous  Airway Management Planned: LMA  Additional Equipment:   Intra-op Plan:   Post-operative Plan: Extubation in OR  Informed Consent: I have reviewed the patients History and Physical, chart, labs and discussed the procedure including the risks, benefits and alternatives for the proposed anesthesia with the patient or authorized representative who has indicated his/her understanding and acceptance.   Dental advisory given  Plan Discussed with: CRNA  Anesthesia Plan Comments:        Anesthesia Quick Evaluation

## 2016-03-22 NOTE — Anesthesia Procedure Notes (Signed)
Procedure Name: LMA Insertion Date/Time: 03/22/2016 12:15 PM Performed by: Minola Guin D Pre-anesthesia Checklist: Patient identified, Emergency Drugs available, Suction available and Patient being monitored Patient Re-evaluated:Patient Re-evaluated prior to inductionOxygen Delivery Method: Circle system utilized Preoxygenation: Pre-oxygenation with 100% oxygen Intubation Type: IV induction Ventilation: Mask ventilation without difficulty LMA: LMA inserted LMA Size: 4.0 Number of attempts: 1 Airway Equipment and Method: Bite block Placement Confirmation: positive ETCO2 Tube secured with: Tape Dental Injury: Teeth and Oropharynx as per pre-operative assessment

## 2016-03-22 NOTE — Discharge Instructions (Signed)

## 2016-03-22 NOTE — H&P (Signed)
Kim Archer  Location: [facility] Surgery Patient #: [id number] DOB: September 04, 1965 Married / Language: English / Race: White Female   History of Present Illness  The patient is a 51 year old female who presents with a breast mass. We are asked to see the patient in consultation by Dr. Dian Archer to evaluate her for an area of atypical lobular hyperplasia in the left breast. The patient is a 51 year old white female who recently went for a routine screening mammogram. At that time she was not having any breast pain or discharge from the nipple. She was found to have a 2-1/2 cm area of abnormal calcification in the lower aspect of the left breast. This was biopsied and came back as atypical lobular hyperplasia. She does not smoke. She does have a family history of breast cancer in her mother. There are some other cancers that do run in her family. She does not take any hormone replacement although she did take some hormones several years ago.   Past Surgical History  Breast Biopsy  Left. Cataract Surgery  Left. Cesarean Section - Multiple   Diagnostic Studies History  Mammogram  within last year Pap Smear  1-5 years ago  Allergies Sulfacetamide *CHEMICALS*  Rash Allergies Reconciled   Medication History Multi-Minerals (Oral daily) Active. Ferrous Sulfate (90 (18 Fe)MG Tablet, Oral daily) Active. Claritin (10MG  Capsule, Oral daily) Active. Medications Reconciled  Social History Caffeine use  Carbonated beverages, Tea. No alcohol use  No drug use  Tobacco use  Never smoker.  Family History Arthritis  Father. Breast Cancer  Mother. Cancer  Brother. Cerebrovascular Accident  Father. Colon Cancer  Family Members In General. Heart Disease  Mother. Heart disease in female family member before age 68  Kidney Disease  Mother. Melanoma  Father. Thyroid problems  Father.  Pregnancy / Birth History  Age at menarche  16  years. Contraceptive History  Oral contraceptives. Gravida  3 Length (months) of breastfeeding  7-12 Maternal age  64-20 Para  3 Regular periods   Other Problems  Hemorrhoids     Review of Systems  General Present- Weight Gain. Not Present- Appetite Loss, Chills, Fatigue, Fever, Night Sweats and Weight Loss. Skin Present- Dryness. Not Present- Change in Wart/Mole, Hives, Jaundice, New Lesions, Non-Healing Wounds, Rash and Ulcer. HEENT Not Present- Earache, Hearing Loss, Hoarseness, Nose Bleed, Oral Ulcers, Ringing in the Ears, Seasonal Allergies, Sinus Pain, Sore Throat, Visual Disturbances, Wears glasses/contact lenses and Yellow Eyes. Respiratory Not Present- Bloody sputum, Chronic Cough, Difficulty Breathing, Snoring and Wheezing. Breast Not Present- Breast Mass, Breast Pain, Nipple Discharge and Skin Changes. Cardiovascular Present- Rapid Heart Rate. Not Present- Chest Pain, Difficulty Breathing Lying Down, Leg Cramps, Palpitations, Shortness of Breath and Swelling of Extremities. Gastrointestinal Present- Constipation and Hemorrhoids. Not Present- Abdominal Pain, Bloating, Bloody Stool, Change in Bowel Habits, Chronic diarrhea, Difficulty Swallowing, Excessive gas, Gets full quickly at meals, Indigestion, Nausea, Rectal Pain and Vomiting. Female Genitourinary Not Present- Frequency, Nocturia, Painful Urination, Pelvic Pain and Urgency. Musculoskeletal Present- Back Pain. Not Present- Joint Pain, Joint Stiffness, Muscle Pain, Muscle Weakness and Swelling of Extremities. Neurological Present- Decreased Memory. Not Present- Fainting, Headaches, Numbness, Seizures, Tingling, Tremor, Trouble walking and Weakness. Psychiatric Not Present- Anxiety, Bipolar, Change in Sleep Pattern, Depression, Fearful and Frequent crying. Hematology Not Present- Blood Thinners, Easy Bruising, Excessive bleeding, Gland problems, HIV and Persistent Infections.  Vitals Weight: 169.8 lb Height:  67in Body Surface Area: 1.89 m Body Mass Index: 26.59 kg/m  Temp.: 98.26F  Pulse: 66 (Regular)  BP: 112/62 (Sitting, Left Arm, Standard)       Physical Exam  General Mental Status-Alert. General Appearance-Consistent with stated age. Hydration-Well hydrated. Voice-Normal.  Head and Neck Head-normocephalic, atraumatic with no lesions or palpable masses. Trachea-midline. Thyroid Gland Characteristics - normal size and consistency.  Eye Eyeball - Bilateral-Extraocular movements intact. Sclera/Conjunctiva - Bilateral-No scleral icterus.  Chest and Lung Exam Chest and lung exam reveals -quiet, even and easy respiratory effort with no use of accessory muscles and on auscultation, normal breath sounds, no adventitious sounds and normal vocal resonance. Inspection Chest Wall - Normal. Back - normal.  Breast Note: There is no palpable mass in either breast. There is no palpable axillary, supraclavicular, or cervical lymphadenopathy.   Cardiovascular Cardiovascular examination reveals -normal heart sounds, regular rate and rhythm with no murmurs and normal pedal pulses bilaterally.  Abdomen Inspection Inspection of the abdomen reveals - No Hernias. Skin - Scar - no surgical scars. Palpation/Percussion Palpation and Percussion of the abdomen reveal - Soft, Non Tender, No Rebound tenderness, No Rigidity (guarding) and No hepatosplenomegaly. Auscultation Auscultation of the abdomen reveals - Bowel sounds normal.  Neurologic Neurologic evaluation reveals -alert and oriented x 3 with no impairment of recent or remote memory. Mental Status-Normal.  Musculoskeletal Normal Exam - Left-Upper Extremity Strength Normal and Lower Extremity Strength Normal. Normal Exam - Right-Upper Extremity Strength Normal and Lower Extremity Strength Normal.  Lymphatic Head & Neck  General Head & Neck Lymphatics: Bilateral - Description -  Normal. Axillary  General Axillary Region: Bilateral - Description - Normal. Tenderness - Non Tender. Femoral & Inguinal  Generalized Femoral & Inguinal Lymphatics: Bilateral - Description - Normal. Tenderness - Non Tender.    Assessment & Plan  ATYPICAL LOBULAR HYPERPLASIA (ALH) OF LEFT BREAST (N60.92) Impression: The patient appears to have a 2-1/2 cm area of calcification that is consistent with atypical lobular hyperplasia in the lower aspect of the left breast. Because of its abnormal appearance and because it is considered a high risk lesion I would recommend that this area be removed. I have discussed with her in detail the risks and benefits of the operation to do this as well as some of the technical aspects and she understands and wishes to proceed. If her final pathology just shows atypical lobular hyperplasia then we will refer her to the high risk clinic to talk about risk reduction. I will plan for a left breast radioactive seed localized lumpectomy Current Plans Pt Education - Breast Diseases: discussed with patient and provided information.

## 2016-03-22 NOTE — Anesthesia Postprocedure Evaluation (Addendum)
Anesthesia Post Note  Patient: Kim Archer  Procedure(s) Performed: Procedure(s) (LRB): LEFT BREAST LUMPECTOMY WITH RADIOACTIVE SEED LOCALIZATION (Left)  Patient location during evaluation: PACU Anesthesia Type: General Level of consciousness: awake and alert Pain management: pain level controlled Vital Signs Assessment: post-procedure vital signs reviewed and stable Respiratory status: spontaneous breathing, nonlabored ventilation, respiratory function stable and patient connected to nasal cannula oxygen Cardiovascular status: blood pressure returned to baseline and stable Postop Assessment: no signs of nausea or vomiting Anesthetic complications: no       Last Vitals:  Vitals:   03/22/16 1307 03/22/16 1315  BP: (!) 111/59 118/63  Pulse: 71 69  Resp: 16 11  Temp: 36.7 C     Last Pain:  Vitals:   03/22/16 1307  TempSrc:   PainSc: Asleep                 Effie Berkshire

## 2016-03-23 ENCOUNTER — Encounter (HOSPITAL_BASED_OUTPATIENT_CLINIC_OR_DEPARTMENT_OTHER): Payer: Self-pay | Admitting: General Surgery

## 2016-04-15 ENCOUNTER — Telehealth: Payer: Self-pay | Admitting: Oncology

## 2016-04-15 ENCOUNTER — Encounter: Payer: Self-pay | Admitting: Oncology

## 2016-04-15 NOTE — Telephone Encounter (Signed)
Appt has been scheduled for the pt to see Dr. [name] on 3/27 at 4pm. Pt aware to arrive 30 minutes early. Demographics verified. Pt agreed to the appt date and time. Letter mailed.

## 2016-05-10 ENCOUNTER — Other Ambulatory Visit: Payer: Self-pay | Admitting: Adult Health

## 2016-05-10 DIAGNOSIS — N6092 Unspecified benign mammary dysplasia of left breast: Secondary | ICD-10-CM | POA: Insufficient documentation

## 2016-05-11 ENCOUNTER — Ambulatory Visit (HOSPITAL_BASED_OUTPATIENT_CLINIC_OR_DEPARTMENT_OTHER): Payer: 59 | Admitting: Oncology

## 2016-05-11 ENCOUNTER — Other Ambulatory Visit (HOSPITAL_BASED_OUTPATIENT_CLINIC_OR_DEPARTMENT_OTHER): Payer: 59

## 2016-05-11 VITALS — BP 115/66 | HR 57 | Temp 98.2°F | Resp 18 | Ht 67.0 in | Wt 171.8 lb

## 2016-05-11 DIAGNOSIS — Z862 Personal history of diseases of the blood and blood-forming organs and certain disorders involving the immune mechanism: Secondary | ICD-10-CM

## 2016-05-11 DIAGNOSIS — Z809 Family history of malignant neoplasm, unspecified: Secondary | ICD-10-CM | POA: Diagnosis not present

## 2016-05-11 DIAGNOSIS — N6092 Unspecified benign mammary dysplasia of left breast: Secondary | ICD-10-CM

## 2016-05-11 LAB — CBC WITH DIFFERENTIAL/PLATELET
BASO%: 0.4 % (ref 0.0–2.0)
Basophils Absolute: 0 10*3/uL (ref 0.0–0.1)
EOS%: 2 % (ref 0.0–7.0)
Eosinophils Absolute: 0.2 10*3/uL (ref 0.0–0.5)
HCT: 38.9 % (ref 34.8–46.6)
HGB: 12.7 g/dL (ref 11.6–15.9)
LYMPH%: 32.7 % (ref 14.0–49.7)
MCH: 29.1 pg (ref 25.1–34.0)
MCHC: 32.8 g/dL (ref 31.5–36.0)
MCV: 88.9 fL (ref 79.5–101.0)
MONO#: 0.5 10*3/uL (ref 0.1–0.9)
MONO%: 5.8 % (ref 0.0–14.0)
NEUT%: 59.1 % (ref 38.4–76.8)
NEUTROS ABS: 4.8 10*3/uL (ref 1.5–6.5)
Platelets: 244 10*3/uL (ref 145–400)
RBC: 4.37 10*6/uL (ref 3.70–5.45)
RDW: 16.8 % — ABNORMAL HIGH (ref 11.2–14.5)
WBC: 8.1 10*3/uL (ref 3.9–10.3)
lymph#: 2.7 10*3/uL (ref 0.9–3.3)

## 2016-05-11 LAB — COMPREHENSIVE METABOLIC PANEL
ALT: 23 U/L (ref 0–55)
ANION GAP: 10 meq/L (ref 3–11)
AST: 19 U/L (ref 5–34)
Albumin: 3.9 g/dL (ref 3.5–5.0)
Alkaline Phosphatase: 98 U/L (ref 40–150)
BILIRUBIN TOTAL: 0.36 mg/dL (ref 0.20–1.20)
BUN: 10.9 mg/dL (ref 7.0–26.0)
CO2: 26 meq/L (ref 22–29)
Calcium: 9.5 mg/dL (ref 8.4–10.4)
Chloride: 106 mEq/L (ref 98–109)
Creatinine: 1 mg/dL (ref 0.6–1.1)
EGFR: 69 mL/min/{1.73_m2} — AB (ref >90)
Glucose: 90 mg/dl (ref 70–140)
Potassium: 4.3 mEq/L (ref 3.5–5.1)
SODIUM: 142 meq/L (ref 136–145)
TOTAL PROTEIN: 7 g/dL (ref 6.4–8.3)

## 2016-05-11 MED ORDER — TAMOXIFEN CITRATE 20 MG PO TABS: 20.0000 mg | ORAL_TABLET | Freq: Every day | ORAL | 12 refills | Status: AC

## 2016-05-11 NOTE — Progress Notes (Signed)
[place]  [phone]) [phone] [phone]) [phone]     ID: ASHELYNN MARKS DOB: [birth year]  MR#: [id number]  BZJ#:[id number]  Patient Care Team: [name], MD as PCP - General (Obstetrics and Gynecology) Chauncey Cruel, MD as Consulting Physician (Oncology) Autumn Messing III, MD as Consulting Physician (General Surgery) [name], MD as Consulting Physician (Obstetrics and Gynecology) Chauncey Cruel, MD OTHER MD:  CHIEF COMPLAINT:   CURRENT TREATMENT:    HISTORY OF PRESENT ILLNESS:: Kim Archer (pronounced "key-shok") had routine screening mammography showing a new left breast calcifications and was referred to the breast Center where on 02/27/2016 left diagnostic mammography found the breast density to be category C. There were amorphous calcifications in the left lower central breast measuring 2.6 cm.  Biopsy of this area on 03/02/2016 showed ([id number]) atypical lobular hyperplasia. Accordingly the patient was referred to surgery and on 03/22/2016 underwent left lumpectomy. The final pathology there ([id number]) showed only atypical lobular hyperplasia, with no evidence of malignancy.  Patient's subsequent history is as detailed below  INTERVAL HISTORY: Kim Archer was evaluated in the breast cancer clinic 05/11/2016.   REVIEW OF SYSTEMS: S formerly Kim Archer before you make that decision yet the plan is for her to go to radiology where he tolerated her surgery well, with no unusual bleeding, rash, or swelling. She exercises regularly. A detailed review of systems today was otherwise noncontributory  PAST MEDICAL HISTORY: Past Medical History:  Diagnosis Date  . Anemia    due to heavy periods  . Atypical ductal hyperplasia of left breast     PAST SURGICAL HISTORY: Past Surgical History:  Procedure Laterality Date  . BREAST CYST EXCISION Left 03/22/2016   ALH-BENIGN  . BREAST LUMPECTOMY WITH RADIOACTIVE SEED LOCALIZATION Left 03/22/2016   Procedure: LEFT BREAST LUMPECTOMY WITH RADIOACTIVE SEED LOCALIZATION;  Surgeon: Autumn Messing III, MD;  Location: [facility];  Service: General;  Laterality: Left;  . CESAREAN SECTION     x3  . EYE SURGERY     lt lens replacement  . TUBAL LIGATION      FAMILY HISTORY No family history on file.  The patient's father is alive at age 52. He has a history of melanoma. He has a brother, the patient's uncle, with prostate cancer. The patient's mother died in her 52s from urosepsis. She had a history of breast cancer diagnosed in her 70s. The patient has a brother, with cerebral palsy, who had "stomach cancer" metastatic to the liver. The patient has 2 sisters. One of them died from childhood leukemia.   GYNECOLOGIC HISTORY:  No LMP recorded.  Menarche age 45. First live birth age 72. The patient is GX P3. The patient is still having regular periods although she missed February and her periods this month have been somewhat irregular. She used oral contraceptives remotely for many years with no complications. She is status post bilateral tubal ligation.    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 The patient's daughter [name] works as a Theme park manager in [place]. She has 3 children. The patient's daughter [name] lives in [place]. She has 3 children but there are problems not documented here leading to the children not being cared by her. One of them is being cared for by the patient. The patient's third child, [name], 79 years old, lives in the  patient's home and works in Architect. Patient has in total 6 grandchildren. She is a Psychologist, forensic.    ADVANCED DIRECTIVES: Not in place  HEALTH MAINTENANCE: Social History  Substance Use Topics  . Smoking status: Never Smoker  . Smokeless tobacco: Never Used    . Alcohol use No     Colonoscopy: n/a  PAP:  Bone density: n/a   Allergies  Allergen Reactions  . Ciprofloxacin Other (See Comments)    Patient came into offoice with complaints of difficulty swallowing.  Ate at [employer] prior and took Cipro prior  . Sulfa Antibiotics Rash    Current Outpatient Prescriptions  Medication Sig Dispense Refill  . Ferrous Sulfate (IRON) 90 (18 Fe) MG TABS Take by mouth.    . loratadine (CLARITIN) 10 MG tablet Take 10 mg by mouth daily.    . Multiple Vitamin (MULTIVITAMIN WITH MINERALS) TABS tablet Take 1 tablet by mouth daily.    . tamoxifen (NOLVADEX) 20 MG tablet Take 1 tablet (20 mg total) by mouth daily. 90 tablet 12  . valACYclovir (VALTREX) 500 MG tablet   12   No current facility-administered medications for this visit.     OBJECTIVE: Middle-aged white woman who appears stated age 51:   05/11/16 1528  BP: 115/66  Pulse: (!) 57  Resp: 18  Temp: 98.2 F (36.8 C)     Body mass index is 26.91 kg/m.    ECOG FS:0 - Asymptomatic  Ocular: Sclerae unicteric, pupils equal, round and reactive to light Ear-nose-throat: Oropharynx clear and moist Lymphatic: No cervical or supraclavicular adenopathy Lungs no rales or rhonchi, good excursion bilaterally Heart regular rate and rhythm, no murmur appreciated Abd soft, nontender, positive bowel sounds MSK no focal spinal tenderness, no joint edema Neuro: non-focal, well-oriented, appropriate affect Breasts: The right breast is benign. The left breast is status post recent lumpectomy. The inframammary incision is not visible when the patient is vertical. It has healed well. The overall cosmetic result is excellent. There is minimal retraction of the nipple associated with the subjacent tissue loss. Both axillae are benign.   LAB RESULTS:  CMP     Component Value Date/Time   NA 142 05/11/2016 1508   K 4.3 05/11/2016 1508   CO2 26 05/11/2016 1508   GLUCOSE 90 05/11/2016 1508   BUN 10.9  05/11/2016 1508   CREATININE 1.0 05/11/2016 1508   CALCIUM 9.5 05/11/2016 1508   PROT 7.0 05/11/2016 1508   ALBUMIN 3.9 05/11/2016 1508   AST 19 05/11/2016 1508   ALT 23 05/11/2016 1508   ALKPHOS 98 05/11/2016 1508   BILITOT 0.36 05/11/2016 1508    No results found for: TOTALPROTELP, ALBUMINELP, A1GS, A2GS, BETS, BETA2SER, GAMS, MSPIKE, SPEI  No results found for: Nils Pyle, [facility]  Lab Results  Component Value Date   WBC 8.1 05/11/2016   NEUTROABS 4.8 05/11/2016   HGB 12.7 05/11/2016   HCT 38.9 05/11/2016   MCV 88.9 05/11/2016   PLT 244 05/11/2016      Chemistry      Component Value Date/Time   NA 142 05/11/2016 1508   K 4.3 05/11/2016 1508   CO2 26 05/11/2016 1508   BUN 10.9 05/11/2016 1508   CREATININE 1.0 05/11/2016 1508      Component Value Date/Time   CALCIUM 9.5 05/11/2016 1508   ALKPHOS 98 05/11/2016 1508   AST 19 05/11/2016 1508   ALT 23 05/11/2016 1508   BILITOT 0.36 05/11/2016 1508       No  results found for: LABCA2  No components found for: VPXTGG269  No results for input(s): INR in the last 168 hours.  Urinalysis No results found for: COLORURINE, APPEARANCEUR, LABSPEC, PHURINE, GLUCOSEU, HGBUR, BILIRUBINUR, KETONESUR, PROTEINUR, UROBILINOGEN, NITRITE, LEUKOCYTESUR   STUDIES: Outside studies reviewed with patient  ELIGIBLE FOR AVAILABLE RESEARCH PROTOCOL: no  ASSESSMENT: 51 y.o. [place], [place] woman status post left lumpectomy 03/22/2016 showing atypical lobular hyperplasia  (1) prophylactic tamoxifen started 05/12/2016  PLAN: We spent the better part of today's hour-long appointment discussing the biology of atypical lobular hyperplasia and the specifics of the patient's situation. More specifically. Kim Archer understands ALH  means, first, relatively unrestricted growth of breast cells and, in addition, some morphologically different features from simple hyperplasia. ALH is not necessarily a precancerous condition and we do  not know for a fact that if it is left in place cancer well developed the air.  What we do know is that [facility] is a marker of breast cancer risk. The risk of developing breast cancer in patients with ALH falls somewhere between 1-2% per year. The new cancer can develop in either breast. One half of those tumors would be invasive.  Working from the patient's age to the average survival of women today in the [place] I think Kim Archer can expect to live an additional 30 years or so. This would give her a 30% -plus preferably chance of developing breast cancer in her lifetime. This is approximately 3 times the normal risk.  Kim Archer has essentially two ways of lowering that risk. One of them is bilateral mastectomies. This is not recommended for this indication and I do not believe it would be covered by insurance even if she wanted it which she doesn't. The other, more reasonable approach would be to take anti-estrogens. This could be tamoxifen, raloxifene/Evista, or an aromatase inhibitor. Any of those agents if taken for 5 years wi isll cut the risk of developing a new breast cancer in half.  We also discussed intensified screening. This would be adding yearly MRI to yearly mammography with tomography. This approach greatly increases sensitivity. Any breast cancer that may develop should be found at the earliest possible stage. However intensified screening has not been documented to improve survival in this population. There are issues regarding cost even if insurance coverage is obtained. There also concerns regarding false positives especially in the first or second year of MRI screening.  Kim Archer has a good understanding of all these options. She is not interested in bilateral mastectomies and is reluctant to undergo yearly MRI as discussed. The plan as far as screening is concerned will be for yearly mammography with tomography and biannual physician breast exams.   In terms of anti-estrogens because she is still  menstruating tamoxifen is the only choice. We discussed the possible toxicities, side effects and complications of this agent and she is very interested in proceeding. I'll gone ahead and placed the prescription in for her. She was started as soon as today or tomorrow.  Kim Archer has a good understanding of the overall plan. She agrees with it. She knows the goal of treatment in her case is prevention. She will call with any problems that may develop before her next visit here which will be in approximately 3 months. If she is tolerating tamoxifen well then, we can consider releasing her back to her gynecologist's care.  Chauncey Cruel, MD   05/12/2016 2:53 PM Medical Oncology and Hematology [facility] [address] [place], [place] [address]  Tel. [phone]    Fax. [phone]

## 2016-07-16 NOTE — Addendum Note (Signed)
Addendum  created 07/16/16 1001 by Effie Berkshire, MD   Sign clinical note

## 2016-09-07 ENCOUNTER — Ambulatory Visit: Payer: 59 | Admitting: Oncology

## 2018-07-17 HISTORY — PX: COMBINED HYSTEROSCOPY DIAGNOSTIC / D&C: SUR297

## 2018-08-07 ENCOUNTER — Ambulatory Visit: Payer: BC Managed Care – PPO | Admitting: *Deleted

## 2018-08-07 ENCOUNTER — Other Ambulatory Visit: Payer: Self-pay

## 2018-08-07 VITALS — Ht 67.0 in | Wt 175.0 lb

## 2018-08-07 DIAGNOSIS — Z1211 Encounter for screening for malignant neoplasm of colon: Secondary | ICD-10-CM

## 2018-08-07 MED ORDER — GOLYTELY 236 G PO SOLR: 4000.0000 mL | Freq: Once | ORAL | 0 refills | Status: AC

## 2018-08-07 NOTE — Progress Notes (Signed)
Patient denies any allergies to egg or soy products. Patient has had PONC with general anesthesia/sedation.  Patient denies oxygen use at home and denies diet medications.   Pt verified name, DOB, address and insurance during PV today. Pt mailed instruction packet to included paper to complete and mail back to [facility] with addressed and stamped envelope, Emmi video, copy of consent form to read and not return, and instructions mailed in packet. PV completed over the phone. Pt encouraged to call with questions or issues

## 2018-08-28 ENCOUNTER — Telehealth: Payer: Self-pay | Admitting: Gastroenterology

## 2018-08-28 NOTE — Telephone Encounter (Signed)

## 2018-08-29 ENCOUNTER — Encounter: Payer: Self-pay | Admitting: Gastroenterology

## 2018-08-29 ENCOUNTER — Other Ambulatory Visit: Payer: Self-pay

## 2018-08-29 ENCOUNTER — Ambulatory Visit (AMBULATORY_SURGERY_CENTER): Payer: BC Managed Care – PPO | Admitting: Gastroenterology

## 2018-08-29 VITALS — BP 110/86 | HR 63 | Temp 98.6°F | Resp 18 | Ht 67.0 in | Wt 175.0 lb

## 2018-08-29 DIAGNOSIS — K649 Unspecified hemorrhoids: Secondary | ICD-10-CM

## 2018-08-29 DIAGNOSIS — Z1211 Encounter for screening for malignant neoplasm of colon: Secondary | ICD-10-CM | POA: Diagnosis not present

## 2018-08-29 MED ORDER — SODIUM CHLORIDE 0.9 % IV SOLN: 500.0000 mL | Freq: Once | INTRAVENOUS | Status: DC

## 2018-08-29 NOTE — Patient Instructions (Signed)
Thank-you  For choosing [place] for your healthcare today.  YOU HAD AN ENDOSCOPIC PROCEDURE TODAY AT [facility]:   Refer to the procedure report that was given to you for any specific questions about what was found during the examination.  If the procedure report does not answer your questions, please call your gastroenterologist to clarify.  If you requested that your care partner not be given the details of your procedure findings, then the procedure report has been included in a sealed envelope for you to review at your convenience later.  YOU SHOULD EXPECT: Some feelings of bloating in the abdomen. Passage of more gas than usual.  Walking can help get rid of the air that was put into your GI tract during the procedure and reduce the bloating. If you had a lower endoscopy (such as a colonoscopy or flexible sigmoidoscopy) you may notice spotting of blood in your stool or on the toilet paper. If you underwent a bowel prep for your procedure, you may not have a normal bowel movement for a few days.  Please Note:  You might notice some irritation and congestion in your nose or some drainage.  This is from the oxygen used during your procedure.  There is no need for concern and it should clear up in a day or so.  SYMPTOMS TO REPORT IMMEDIATELY:   Following lower endoscopy (colonoscopy or flexible sigmoidoscopy):  Excessive amounts of blood in the stool  Significant tenderness or worsening of abdominal pains  Swelling of the abdomen that is new, acute  Fever of 100F or higher   For urgent or emergent issues, a gastroenterologist can be reached at any hour by calling [phone].   DIET:  We do recommend a small meal at first, but then you may proceed to your regular diet.  Drink plenty of fluids but you should avoid alcoholic beverages for 24 hours.  ACTIVITY:  You should plan to take it easy for the rest of today and you should NOT DRIVE or use heavy machinery until tomorrow  (because of the sedation medicines used during the test).    FOLLOW UP: Our staff will call the number listed on your records 48-72 hours following your procedure to check on you and address any questions or concerns that you may have regarding the information given to you following your procedure. If we do not reach you, we will leave a message.  We will attempt to reach you two times.  During this call, we will ask if you have developed any symptoms of COVID 19. If you develop any symptoms (ie: fever, flu-like symptoms, shortness of breath, cough etc.) before then, please call [phone].  If you test positive for Covid 19 in the 2 weeks post procedure, please call and report this information to [place].      SIGNATURES/CONFIDENTIALITY: You and/or your care partner have signed paperwork which will be entered into your electronic medical record.  These signatures attest to the fact that that the information above on your After Visit Summary has been reviewed and is understood.  Full responsibility of the confidentiality of this discharge information lies with you and/or your care-partner.

## 2018-08-29 NOTE — Progress Notes (Signed)
Report given to PACU, vss 

## 2018-08-29 NOTE — Op Note (Signed)
[facility] Patient Name: Kim Archer Procedure Date: 08/29/2018 8:02 AM MRN: [id number] Endoscopist: Milus Banister , MD Age: 53 Referring MD:  Date of Birth: 1965-10-31 Gender: Female Account #: [id number] Procedure:                Colonoscopy Indications:              Screening for colorectal malignant neoplasm Medicines:                Monitored Anesthesia Care Procedure:                Pre-Anesthesia Assessment:                           - Prior to the procedure, a History and Physical                            was performed, and patient medications and                            allergies were reviewed. The patient's tolerance of                            previous anesthesia was also reviewed. The risks                            and benefits of the procedure and the sedation                            options and risks were discussed with the patient.                            All questions were answered, and informed consent                            was obtained. Prior Anticoagulants: The patient has                            taken no previous anticoagulant or antiplatelet                            agents. ASA Grade Assessment: II - A patient with                            mild systemic disease. After reviewing the risks                            and benefits, the patient was deemed in                            satisfactory condition to undergo the procedure.                           After obtaining informed consent, the colonoscope  was passed under direct vision. Throughout the                            procedure, the patient's blood pressure, pulse, and                            oxygen saturations were monitored continuously. The                            Model CF-HQ190L [id number]) scope was introduced                            through the anus and advanced to the the cecum,                            identified by  appendiceal orifice and ileocecal                            valve. The colonoscopy was performed without                            difficulty. The patient tolerated the procedure                            well. The quality of the bowel preparation was                            good. The ileocecal valve, appendiceal orifice, and                            rectum were photographed. Scope In: 8:15:42 AM Scope Out: 8:25:33 AM Scope Withdrawal Time: 0 hours 5 minutes 17 seconds  Total Procedure Duration: 0 hours 9 minutes 51 seconds  Findings:                 External and internal hemorrhoids were found. The                            hemorrhoids were small.                           The exam was otherwise without abnormality on                            direct and retroflexion views. Complications:            No immediate complications. Estimated blood loss:                            None. Estimated Blood Loss:     Estimated blood loss: none. Impression:               - External and internal hemorrhoids.                           - The examination was otherwise normal on direct  and retroflexion views.                           - No polyps or cancers. Recommendation:           - Patient has a contact number available for                            emergencies. The signs and symptoms of potential                            delayed complications were discussed with the                            patient. Return to normal activities tomorrow.                            Written discharge instructions were provided to the                            patient.                           - Resume previous diet.                           - Continue present medications.                           - Repeat colonoscopy in 10 years for screening. Milus Banister, MD 08/29/2018 8:27:22 AM This report has been signed electronically.

## 2018-08-31 ENCOUNTER — Telehealth: Payer: Self-pay

## 2018-08-31 NOTE — Telephone Encounter (Signed)
  Follow up Call-  Call back number 08/29/2018  Post procedure Call Back phone  # [phone]  Permission to leave phone message Yes  Some recent data might be hidden     Patient questions:  Do you have a fever, pain , or abdominal swelling? No. Pain Score  0 *  Have you tolerated food without any problems? Yes.    Have you been able to return to your normal activities? Yes.    Do you have any questions about your discharge instructions: Diet   No. Medications  No. Follow up visit  No.  Do you have questions or concerns about your Care? No.  Actions: * If pain score is 4 or above: 1. No action needed, pain <4.Have you developed a fever since your procedure? no  2.   Have you had an respiratory symptoms (SOB or cough) since your procedure? no  3.   Have you tested positive for COVID 19 since your procedure no  4.   Have you had any family members/close contacts diagnosed with the COVID 19 since your procedure?  no   If yes to any of these questions please route to Joylene John, RN and Alphonsa Gin, Therapist, sports.

## 2019-03-28 ENCOUNTER — Other Ambulatory Visit: Payer: Self-pay | Admitting: Obstetrics and Gynecology

## 2019-03-28 DIAGNOSIS — Z90711 Acquired absence of uterus with remaining cervical stump: Secondary | ICD-10-CM | POA: Insufficient documentation

## 2019-06-06 DIAGNOSIS — Z1379 Encounter for other screening for genetic and chromosomal anomalies: Secondary | ICD-10-CM | POA: Insufficient documentation

## 2019-06-06 DIAGNOSIS — Z7189 Other specified counseling: Secondary | ICD-10-CM | POA: Insufficient documentation

## 2020-02-16 DIAGNOSIS — C801 Malignant (primary) neoplasm, unspecified: Secondary | ICD-10-CM

## 2020-02-16 HISTORY — DX: Malignant (primary) neoplasm, unspecified: C80.1

## 2020-06-12 ENCOUNTER — Other Ambulatory Visit: Payer: Self-pay | Admitting: Obstetrics and Gynecology

## 2020-06-12 DIAGNOSIS — R928 Other abnormal and inconclusive findings on diagnostic imaging of breast: Secondary | ICD-10-CM

## 2020-06-20 ENCOUNTER — Ambulatory Visit
Admission: RE | Admit: 2020-06-20 | Discharge: 2020-06-20 | Disposition: A | Discharge status: Home / Self Care | Payer: BC Managed Care – PPO | Source: Ambulatory Visit | Attending: Obstetrics and Gynecology | Admitting: Obstetrics and Gynecology

## 2020-06-20 ENCOUNTER — Other Ambulatory Visit: Payer: Self-pay | Admitting: Obstetrics and Gynecology

## 2020-06-20 ENCOUNTER — Other Ambulatory Visit: Payer: Self-pay

## 2020-06-20 DIAGNOSIS — N6310 Unspecified lump in the right breast, unspecified quadrant: Secondary | ICD-10-CM

## 2020-06-20 DIAGNOSIS — R599 Enlarged lymph nodes, unspecified: Secondary | ICD-10-CM

## 2020-06-20 DIAGNOSIS — R928 Other abnormal and inconclusive findings on diagnostic imaging of breast: Secondary | ICD-10-CM

## 2020-06-20 IMAGING — US US BREAST*R* LIMITED INC AXILLA
1 series · 13 of 21 positions shown · non-contrast
Comparison: Previous exam(s).

CLINICAL DATA: 54-year-old female presenting as a recall from
screening for possible right breast asymmetry.

EXAM:
DIGITAL DIAGNOSTIC UNILATERAL RIGHT MAMMOGRAM WITH TOMOSYNTHESIS AND
CAD; ULTRASOUND RIGHT BREAST LIMITED
TECHNIQUE: Right digital diagnostic mammography and breast tomosynthesis was
performed. The images were evaluated with computer-aided detection.;
Targeted ultrasound examination of the right breast was performed

[Series 1: us breast*right* limited inc axilla · 0.06mm/px · 13 of 21 slices shown]
[im 1/21]
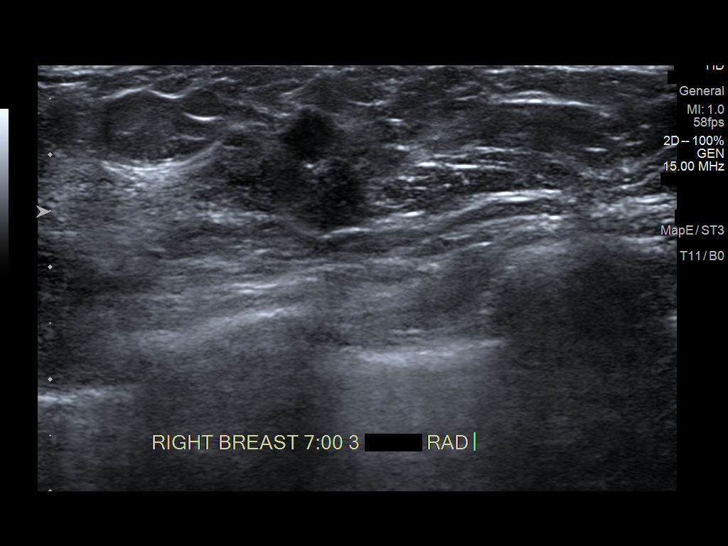
[im 3/21]
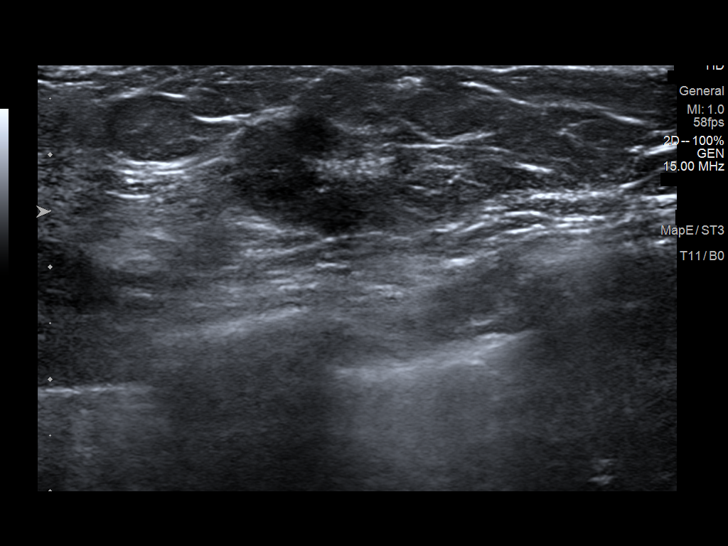
[im 5/21]
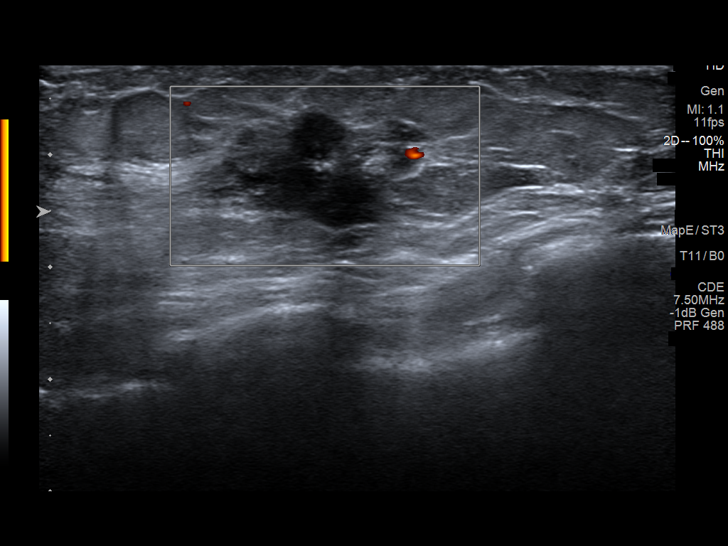
[im 6/21]
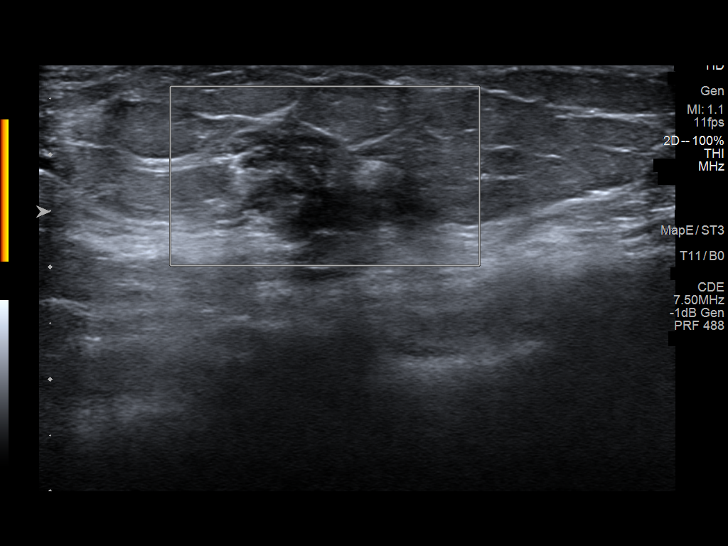
[im 8/21]
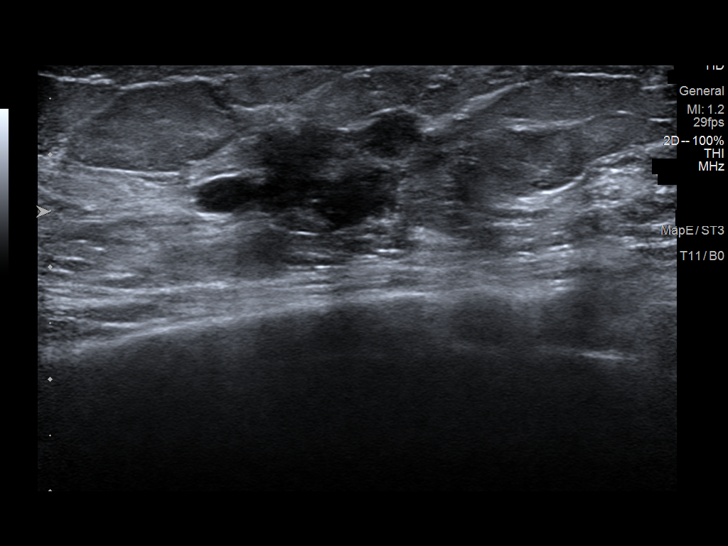
[im 9/21]
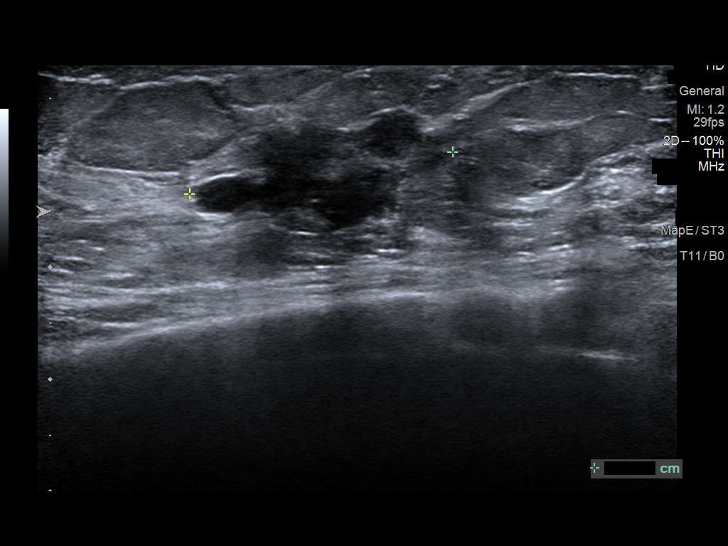
[im 11/21]
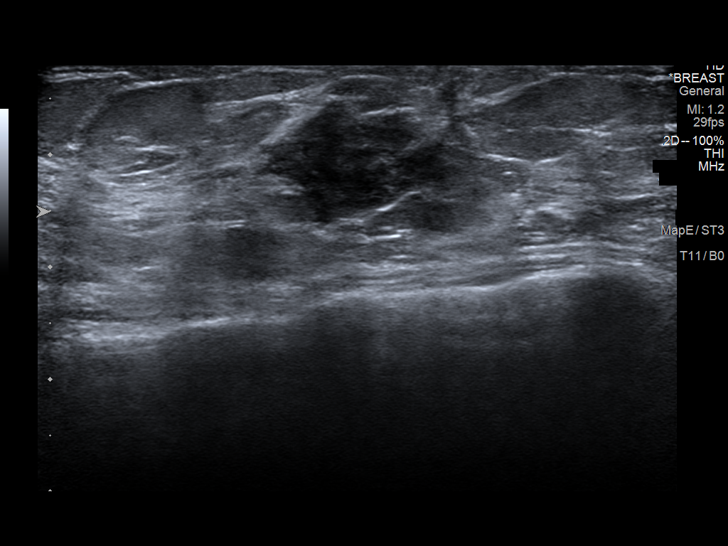
[im 13/21]
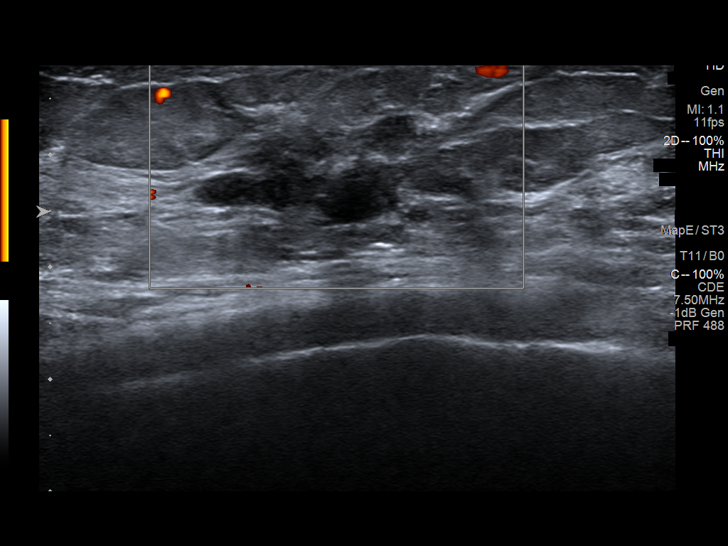
[im 14/21]
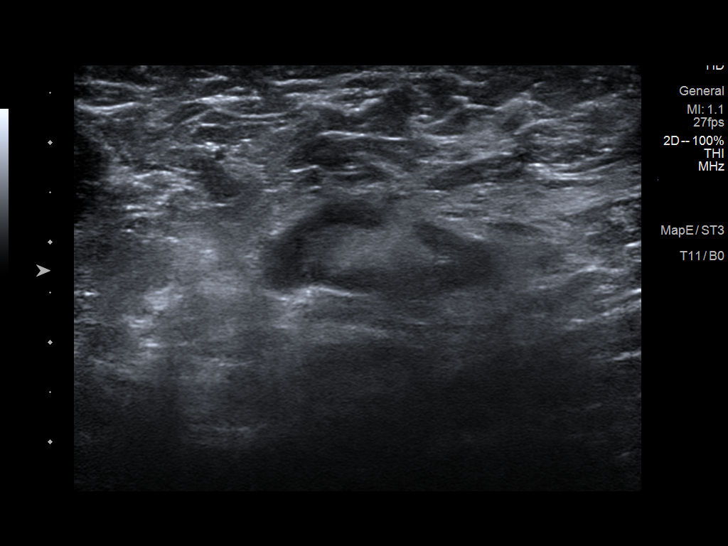
[im 16/21]
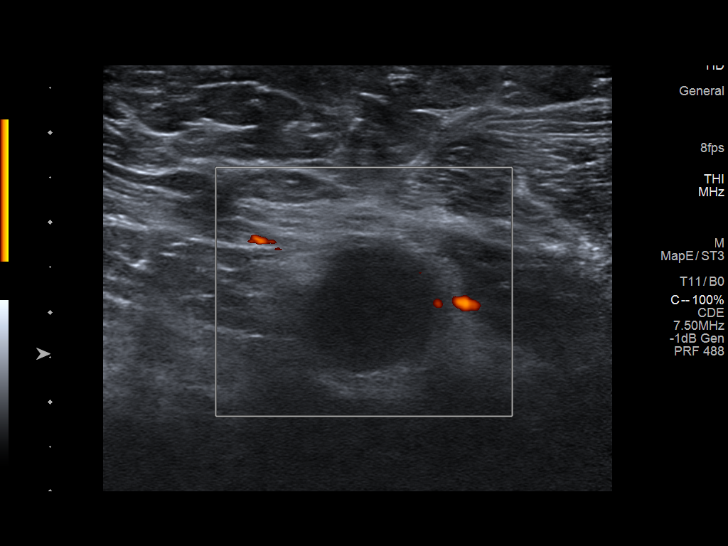
[im 17/21]
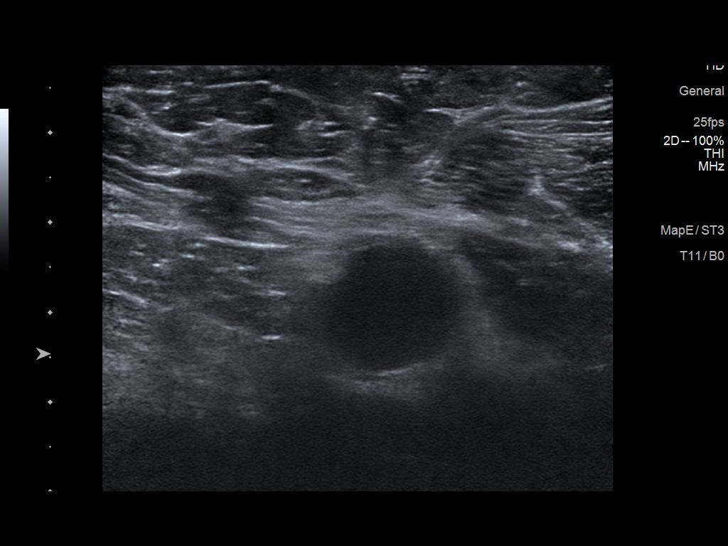
[im 19/21]
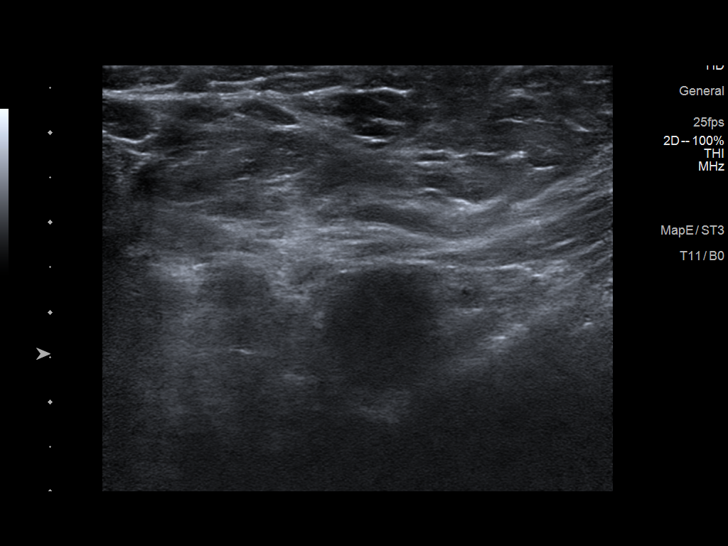
[im 21/21]
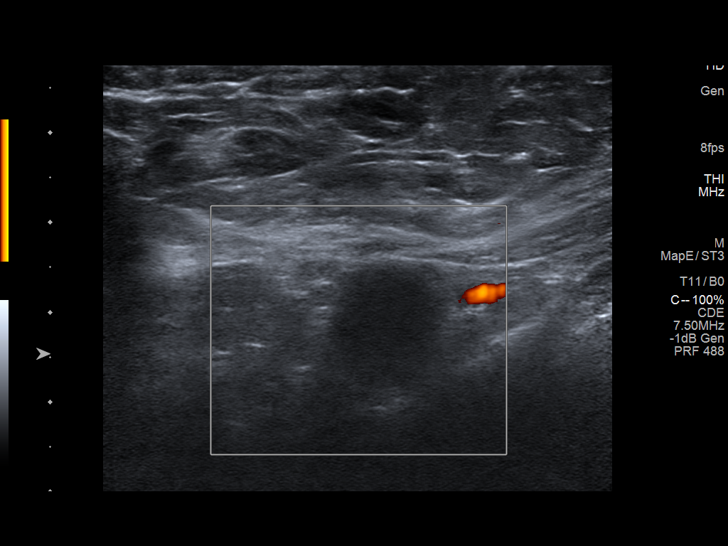

[13 of 21 positions shown; findings below may reference images not displayed]

ACR Breast Density Category c: The breast tissue is heterogeneously
dense, which may obscure small masses.
FINDINGS: Mammogram:

Spot compression tomosynthesis cc and MLO as well as full field mL
tomosynthesis views of the right breast were performed. There is
persistence of an asymmetry with distortion in the central inferior
right breast posterior depth measuring approximately 1.5 cm. There
are no additional new findings elsewhere in the right breast.

On physical exam in the inferior slightly outer right breast I
palpate a focal thickening.

Ultrasound:

Targeted ultrasound performed in the right breast at 7 o'clock 3 cm
from nipple demonstrating an irregular hypoechoic mass measuring
x 1.3 x 2.4 cm. This corresponds to the mammographic finding.

Targeted ultrasound the right axilla demonstrates an abnormally
rounded lymph node with lack of fatty hilum measuring 1.5 cm.
IMPRESSION: 1. Suspicious mass in the right breast at 7 o'clock measuring
cm.

2.  Abnormal right axillary lymph node.

RECOMMENDATION:
Ultrasound-guided core needle biopsy x2 of the right breast mass at
7 o'clock and of the right axillary lymph node.

I have discussed the findings and recommendations with the patient
who agrees to proceed with biopsy. The patient will be scheduled for
the biopsy appointment prior to leaving the office today.

BI-RADS CATEGORY  5: Highly suggestive of malignancy.

## 2020-06-20 IMAGING — MG MM DIGITAL DIAGNOSTIC UNILAT*R* W/ TOMO W/ CAD
8 series · 8 of 24 positions shown · non-contrast
Comparison: Previous exam(s).

CLINICAL DATA: 54-year-old female presenting as a recall from
screening for possible right breast asymmetry.

EXAM:
DIGITAL DIAGNOSTIC UNILATERAL RIGHT MAMMOGRAM WITH TOMOSYNTHESIS AND
CAD; ULTRASOUND RIGHT BREAST LIMITED
TECHNIQUE: Right digital diagnostic mammography and breast tomosynthesis was
performed. The images were evaluated with computer-aided detection.;
Targeted ultrasound examination of the right breast was performed

[R CC synth-2D (1 of 2)]
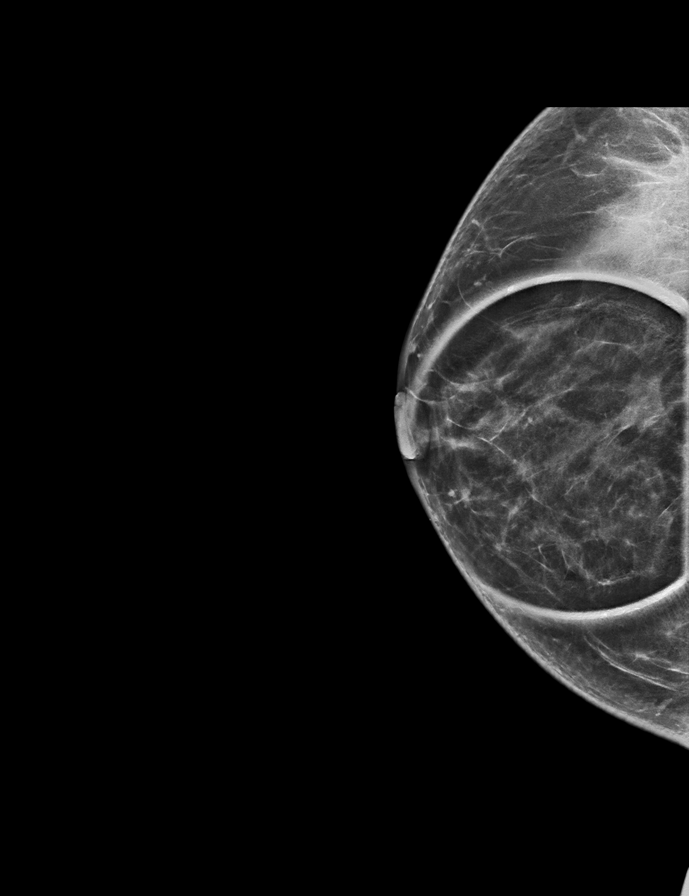

[R MLO synth-2D]
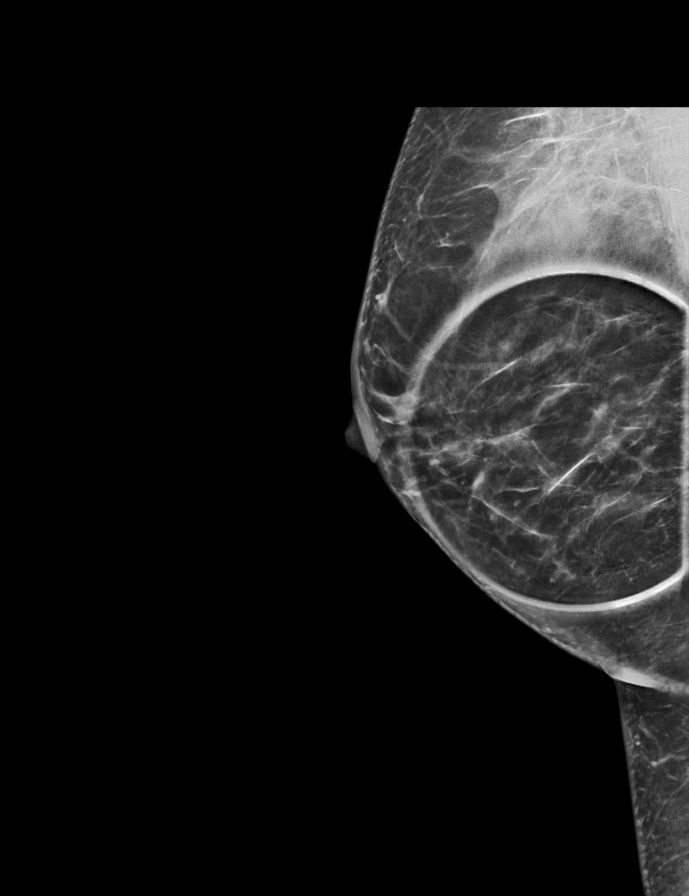

[R ML synth-2D]
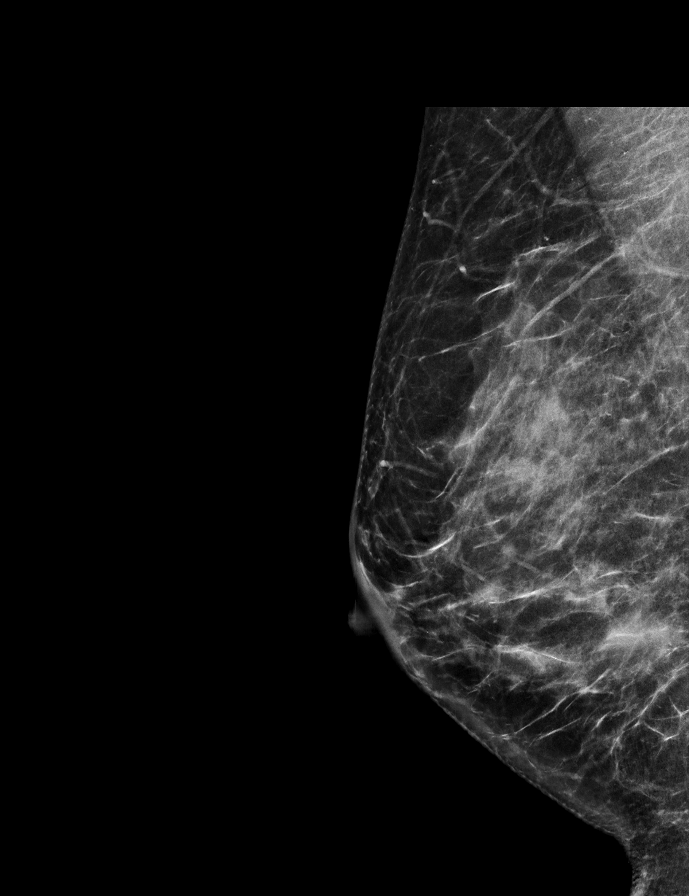

[R CC synth-2D (2 of 2)]
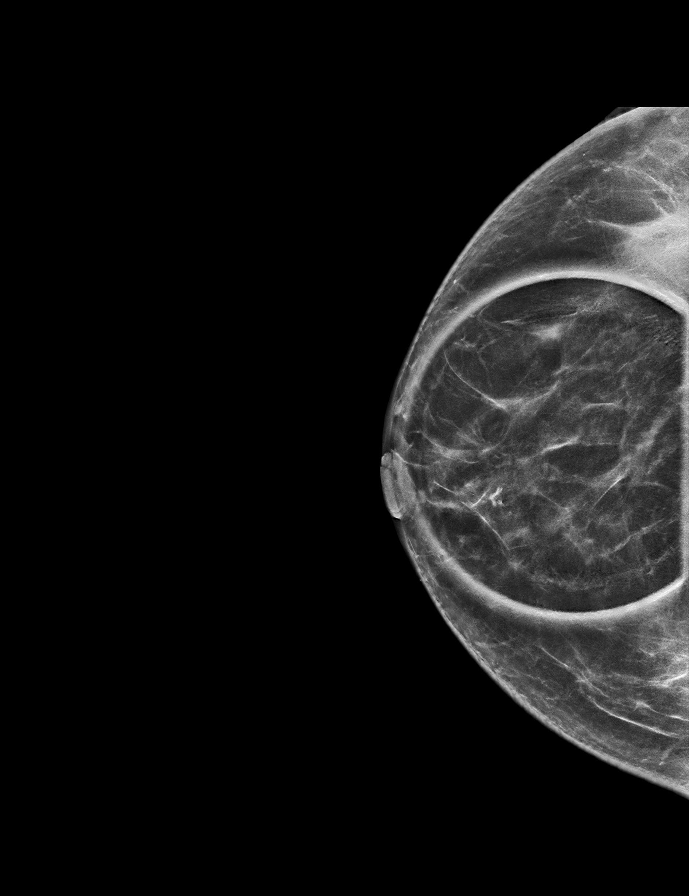

[R MLO tomo · tomo slice 31/61.0]
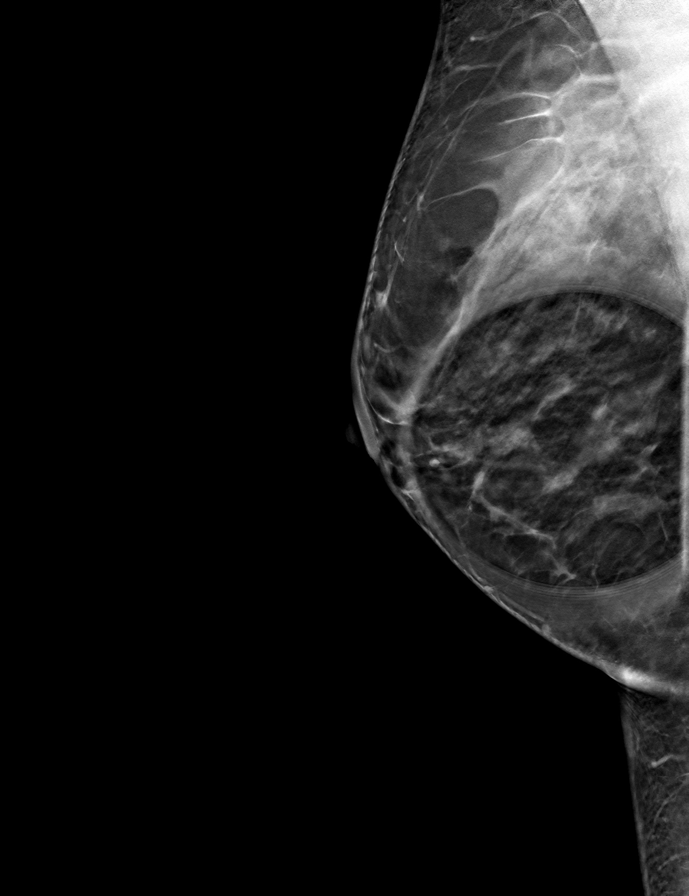

[R CC tomo (1 of 2) · tomo slice 33/66.0]
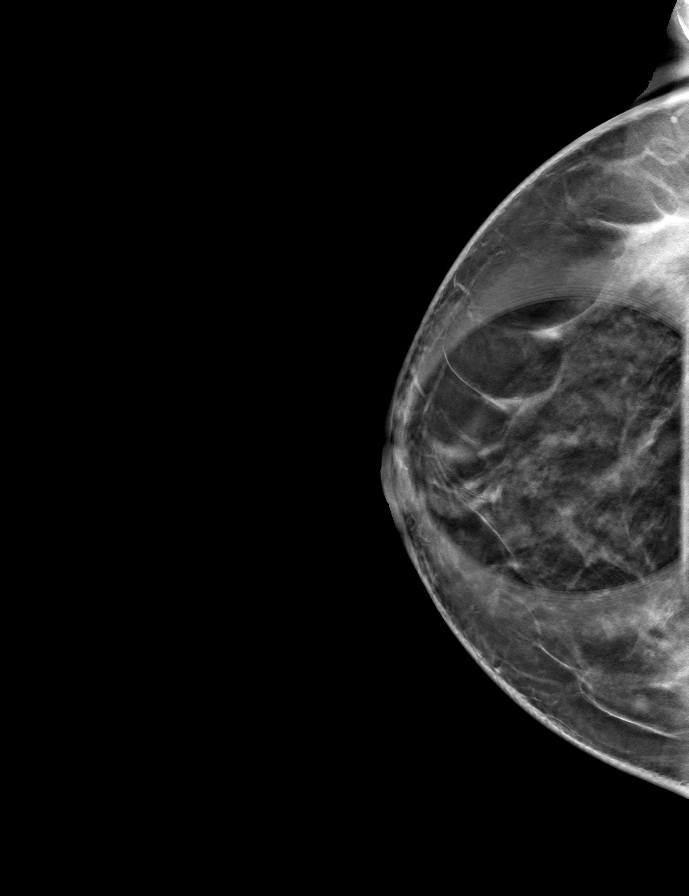

[R ML tomo · tomo slice 33/65.0]
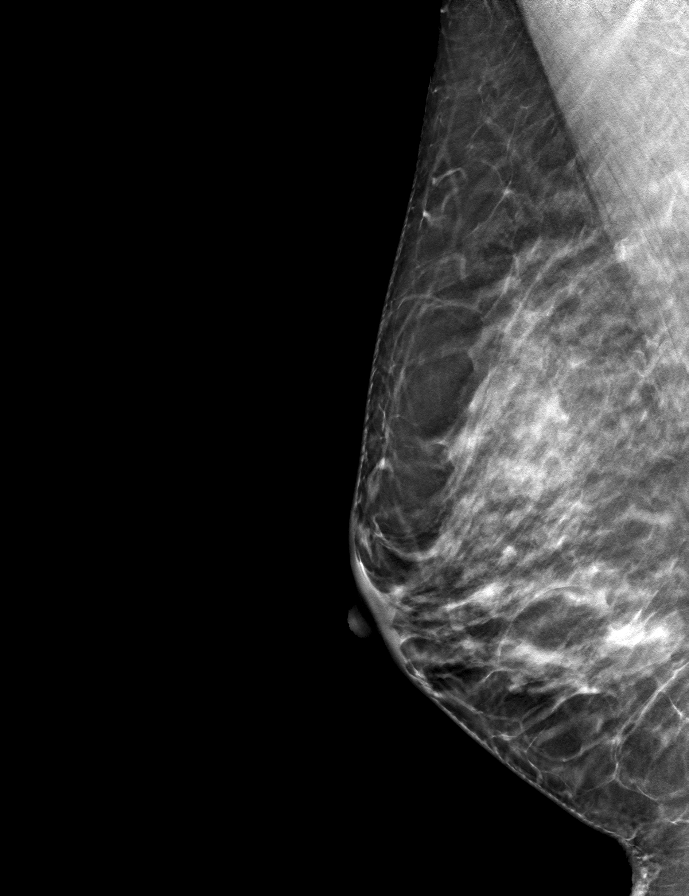

[R CC tomo (2 of 2) · tomo slice 31/62.0]
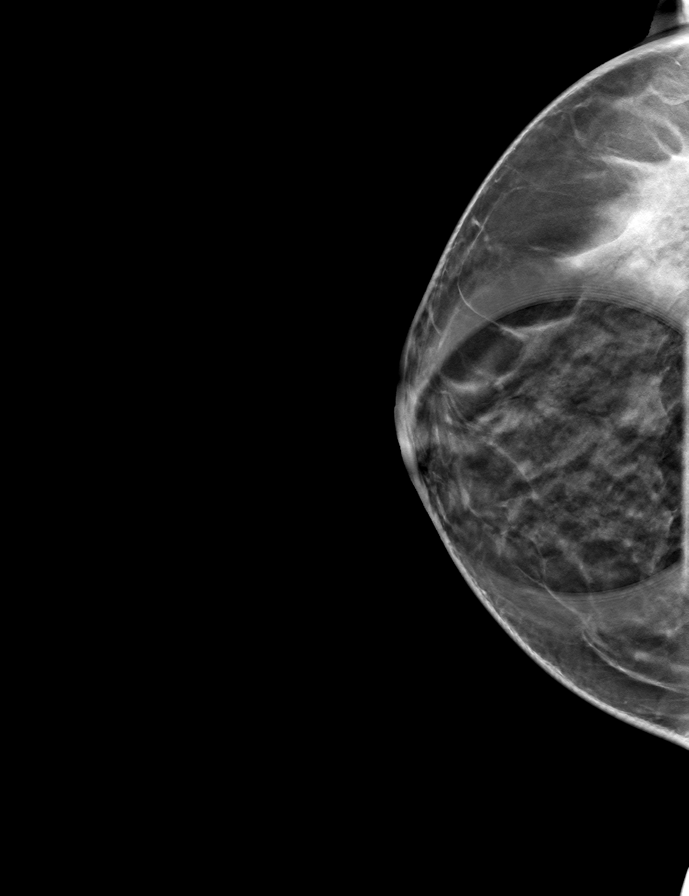

[8 of 24 positions shown; findings below may reference images not displayed]

ACR Breast Density Category c: The breast tissue is heterogeneously
dense, which may obscure small masses.
FINDINGS: Mammogram:

Spot compression tomosynthesis cc and MLO as well as full field mL
tomosynthesis views of the right breast were performed. There is
persistence of an asymmetry with distortion in the central inferior
right breast posterior depth measuring approximately 1.5 cm. There
are no additional new findings elsewhere in the right breast.

On physical exam in the inferior slightly outer right breast I
palpate a focal thickening.

Ultrasound:

Targeted ultrasound performed in the right breast at 7 o'clock 3 cm
from nipple demonstrating an irregular hypoechoic mass measuring
x 1.3 x 2.4 cm. This corresponds to the mammographic finding.

Targeted ultrasound the right axilla demonstrates an abnormally
rounded lymph node with lack of fatty hilum measuring 1.5 cm.
IMPRESSION: 1. Suspicious mass in the right breast at 7 o'clock measuring
cm.

2.  Abnormal right axillary lymph node.

RECOMMENDATION:
Ultrasound-guided core needle biopsy x2 of the right breast mass at
7 o'clock and of the right axillary lymph node.

I have discussed the findings and recommendations with the patient
who agrees to proceed with biopsy. The patient will be scheduled for
the biopsy appointment prior to leaving the office today.

BI-RADS CATEGORY  5: Highly suggestive of malignancy.

## 2020-06-25 ENCOUNTER — Ambulatory Visit
Admission: RE | Admit: 2020-06-25 | Discharge: 2020-06-25 | Disposition: A | Discharge status: Home / Self Care | Payer: 59 | Source: Ambulatory Visit | Attending: Obstetrics and Gynecology | Admitting: Obstetrics and Gynecology

## 2020-06-25 ENCOUNTER — Other Ambulatory Visit: Payer: Self-pay

## 2020-06-25 DIAGNOSIS — R599 Enlarged lymph nodes, unspecified: Secondary | ICD-10-CM

## 2020-06-25 DIAGNOSIS — N6310 Unspecified lump in the right breast, unspecified quadrant: Secondary | ICD-10-CM

## 2020-06-25 IMAGING — US US AXILLARY NODE CORE BIOPSY RIGHT
1 series · 6 of 6 positions shown · non-contrast
Comparison: Previous exam(s).
COMPARISON: Previous exam(s).

Addendum:
CLINICAL DATA: Biopsy of a 7 o'clock right breast mass and an
abnormal right axillary lymph node.

EXAM:
ULTRASOUND GUIDED RIGHT BREAST CORE NEEDLE BIOPSY

[Series 1: us axillary node core biopsy right · 0.07mm/px · 6 of 6 slices shown]
[im 1/6]
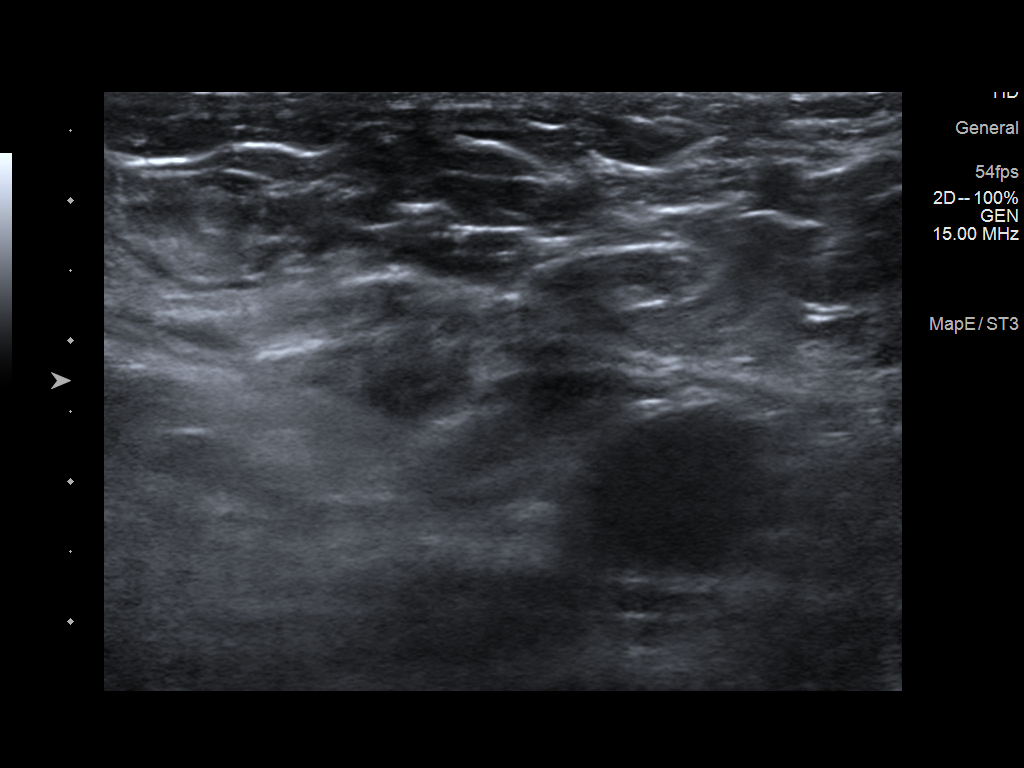
[im 2/6]
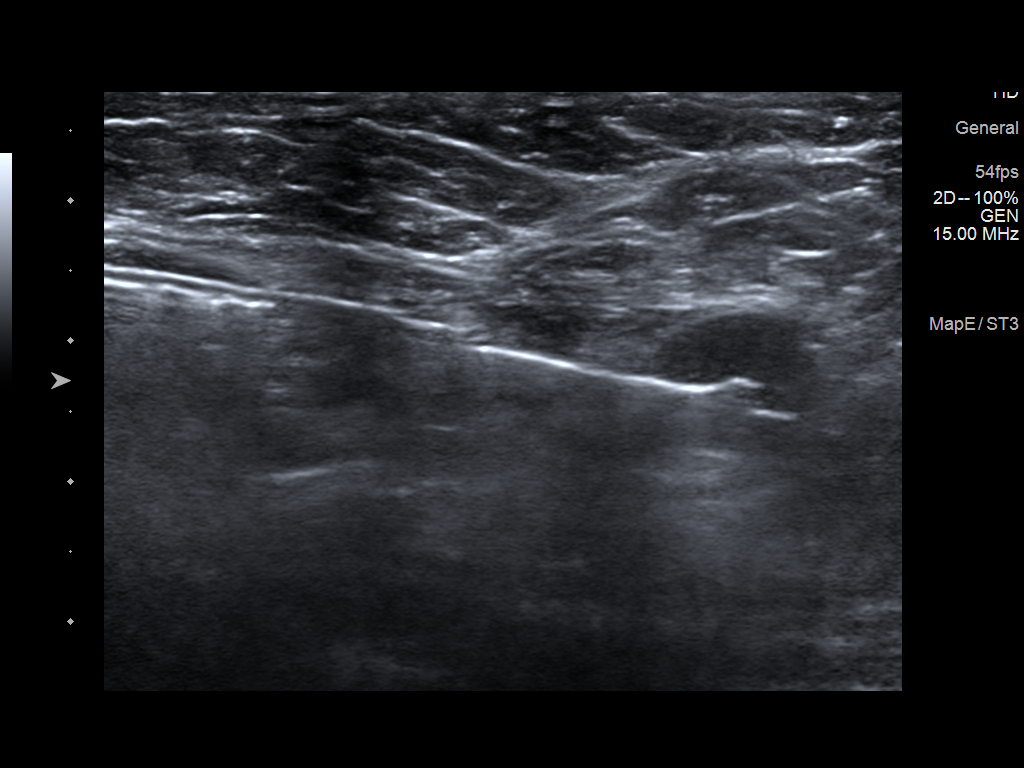
[im 3/6]
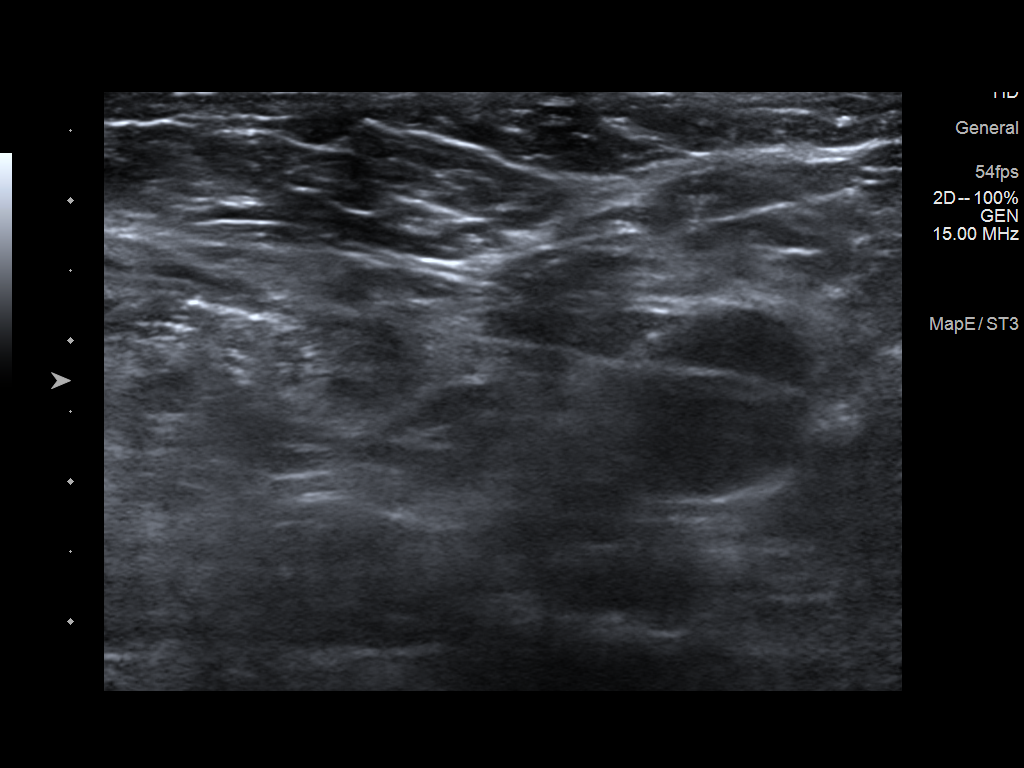
[im 4/6]
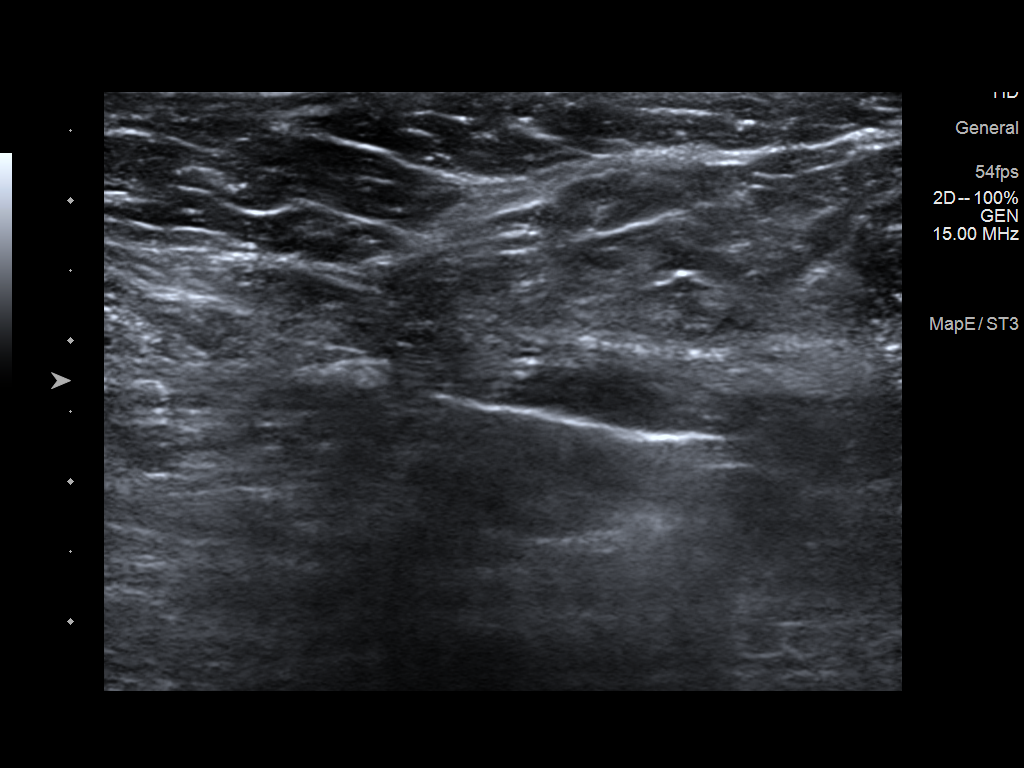
[im 5/6]
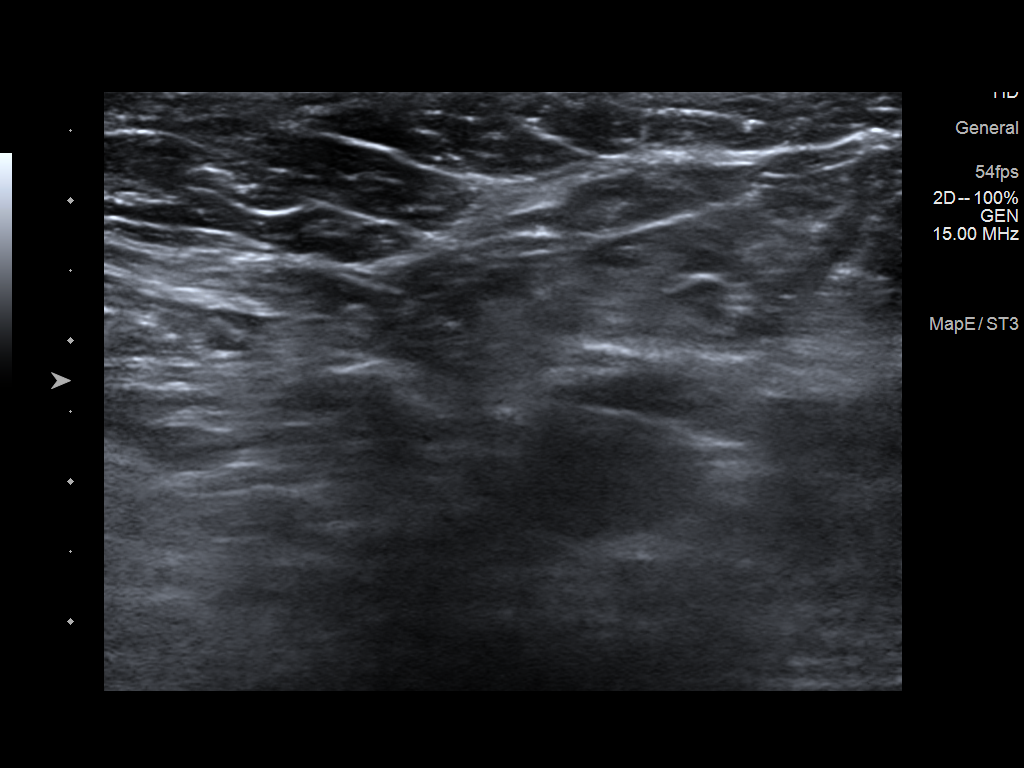
[im 6/6]
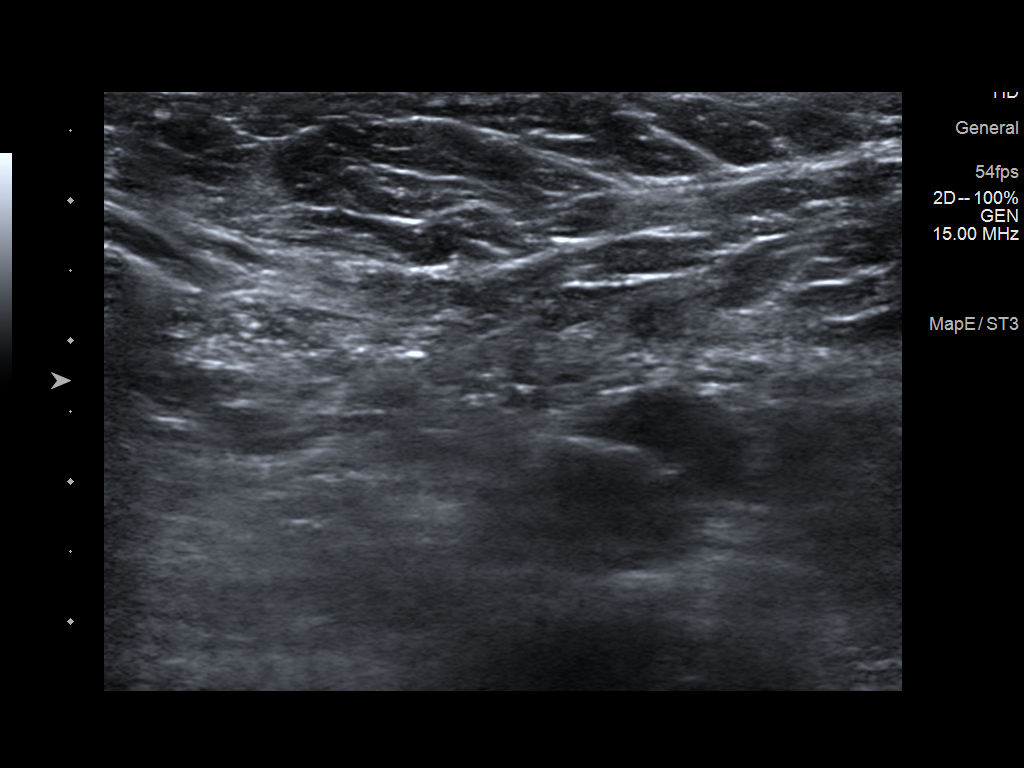

[6 of 6 positions shown; findings below may reference images not displayed]



Lesion quadrant: 7 o'clock right breast

Using sterile technique and 1% Lidocaine as local anesthetic, under
direct ultrasound visualization, a 12 gauge EMRAN device was
used to perform biopsy of a 7 o'clock right breast mass using a
lateral approach. At the conclusion of the procedure a coil shaped
tissue marker clip was deployed into the biopsy cavity. Follow up 2
view mammogram was performed and dictated separately.

Lesion quadrant: Right axilla

Using sterile technique and 1% Lidocaine as local anesthetic, under
direct ultrasound visualization, a 14 gauge EMRAN device was
used to perform biopsy of an abnormal right axillary lymph node
using a lateral approach. At the conclusion of the procedure a
tribell-shaped tissue marker clip was deployed into the biopsy
cavity. Follow up 2 view mammogram was performed and dictated
separately.
IMPRESSION: Ultrasound guided biopsy of a right 7 o'clock breast mass and a
right axillary lymph node. No apparent complications.

ADDENDUM:
Pathology revealed GRADE [DATE] INVASIVE MAMMARY CARCINOMA, PERINEURAL
INVASION of the RIGHT breast, 7 o'clock. This was found to be
concordant by Dr. EMRAN.

Pathology revealed MAMMARY CARCINOMA, CLINICALLY RIGHT AXILLARY
LYMPH NODE. There is no residual lymph node tissue present. This was
found to be concordant by Dr. EMRAN.

Pathology results were discussed with the patient by telephone. The
patient reported doing well after the biopsies with tenderness at
the sites. Post biopsy instructions and care were reviewed and
questions were answered. The patient was encouraged to call The

The patient was referred to [REDACTED]
[REDACTED] at [REDACTED] on

Strongly recommend breast MRI for extent of disease.

Pathology results reported by EMRAN RN on [DATE].



Lesion quadrant: 7 o'clock right breast

Using sterile technique and 1% Lidocaine as local anesthetic, under
direct ultrasound visualization, a 12 gauge EMRAN device was
used to perform biopsy of a 7 o'clock right breast mass using a
lateral approach. At the conclusion of the procedure a coil shaped
tissue marker clip was deployed into the biopsy cavity. Follow up 2
view mammogram was performed and dictated separately.

Lesion quadrant: Right axilla

Using sterile technique and 1% Lidocaine as local anesthetic, under
direct ultrasound visualization, a 14 gauge EMRAN device was
used to perform biopsy of an abnormal right axillary lymph node
using a lateral approach. At the conclusion of the procedure a
tribell-shaped tissue marker clip was deployed into the biopsy
cavity. Follow up 2 view mammogram was performed and dictated
separately.
IMPRESSION: Ultrasound guided biopsy of a right 7 o'clock breast mass and a
right axillary lymph node. No apparent complications.

## 2020-06-25 IMAGING — US US  BREAST BX W/ LOC DEV 1ST LESION IMG BX SPEC US GUIDE*R*
1 series · 8 of 8 positions shown · non-contrast
Comparison: Previous exam(s).
COMPARISON: Previous exam(s).

Addendum:
CLINICAL DATA: Biopsy of a 7 o'clock right breast mass and an
abnormal right axillary lymph node.

EXAM:
ULTRASOUND GUIDED RIGHT BREAST CORE NEEDLE BIOPSY

[Series 1: us breast bx w/ loc dev 1st lesion img bx spec us  · 0.08mm/px · 8 of 8 slices shown]
[im 1/8]
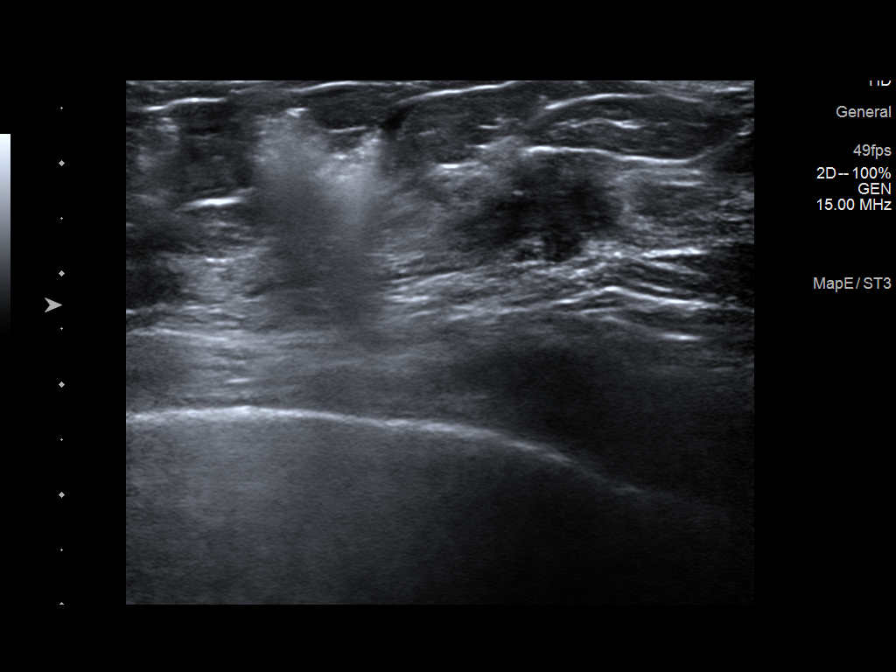
[im 2/8]
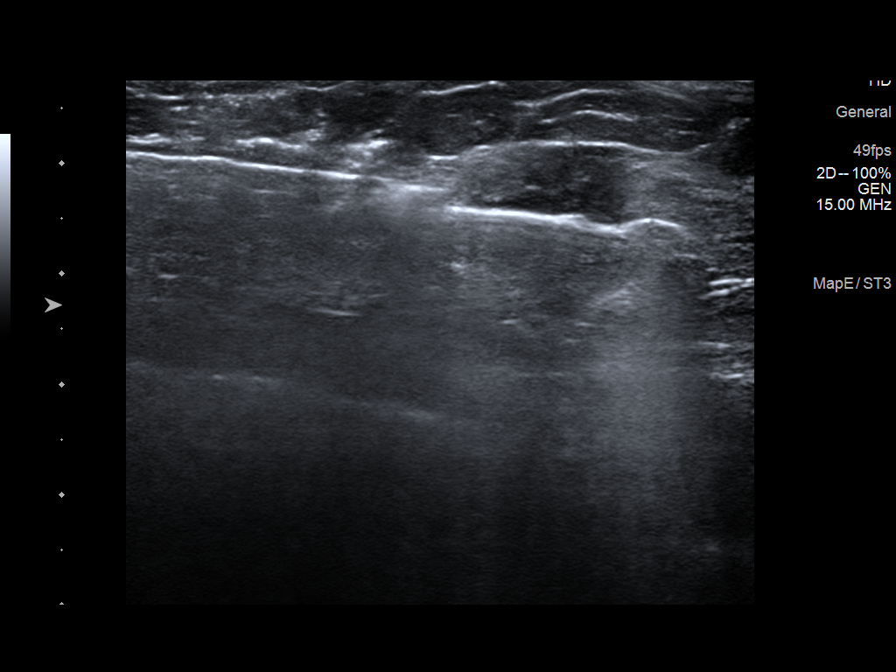
[im 3/8]
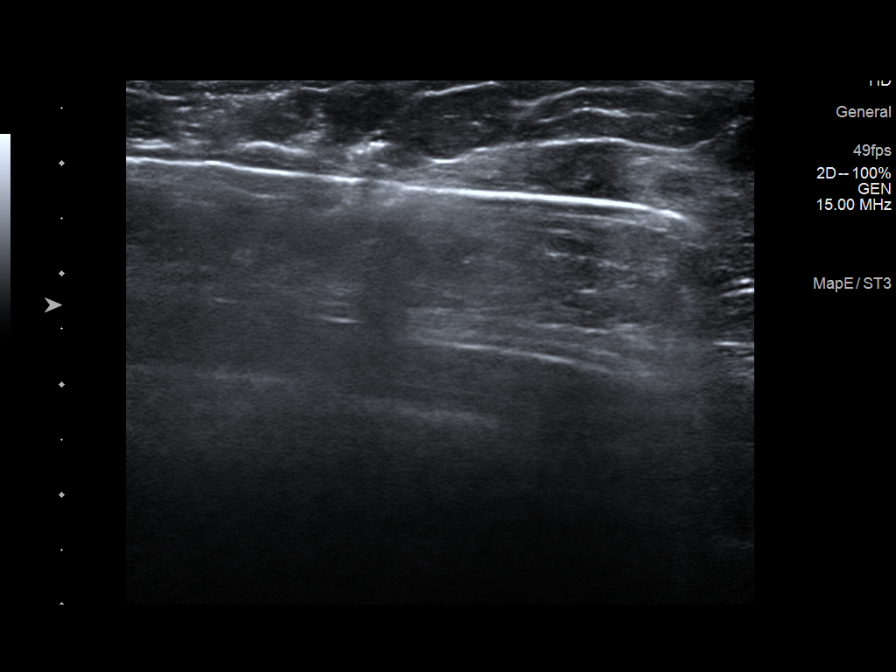
[im 4/8]
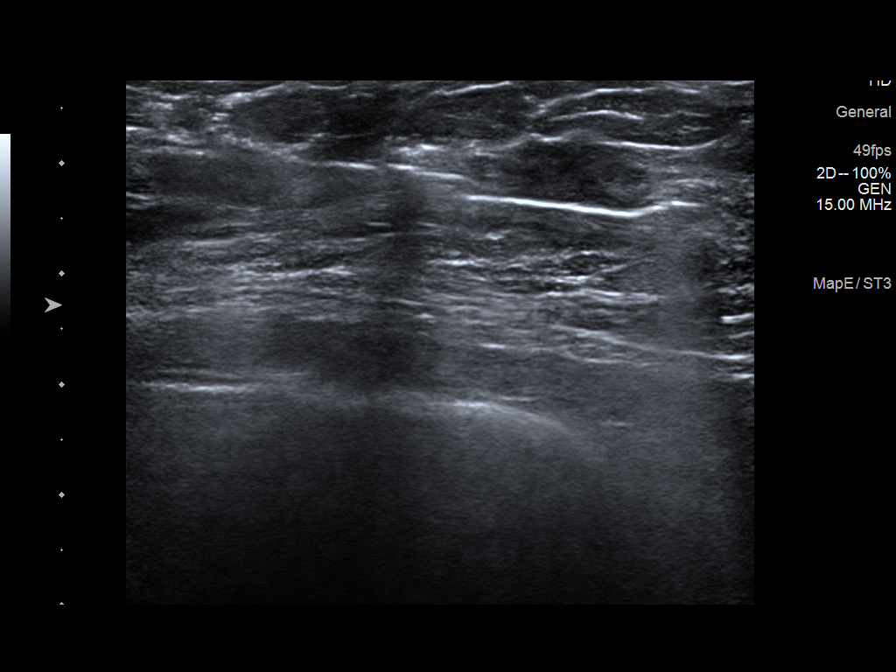
[im 5/8]
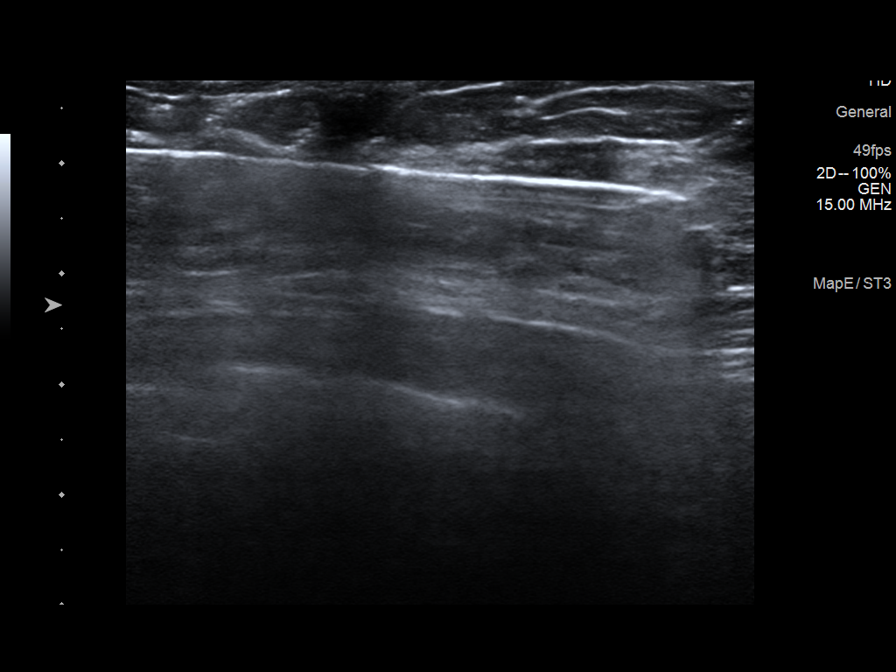
[im 6/8]
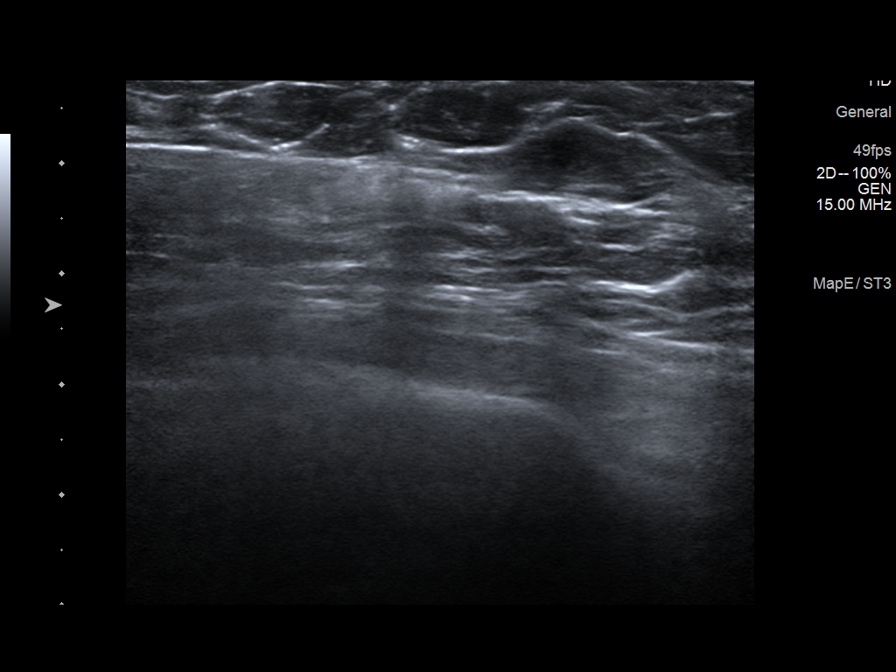
[im 7/8]
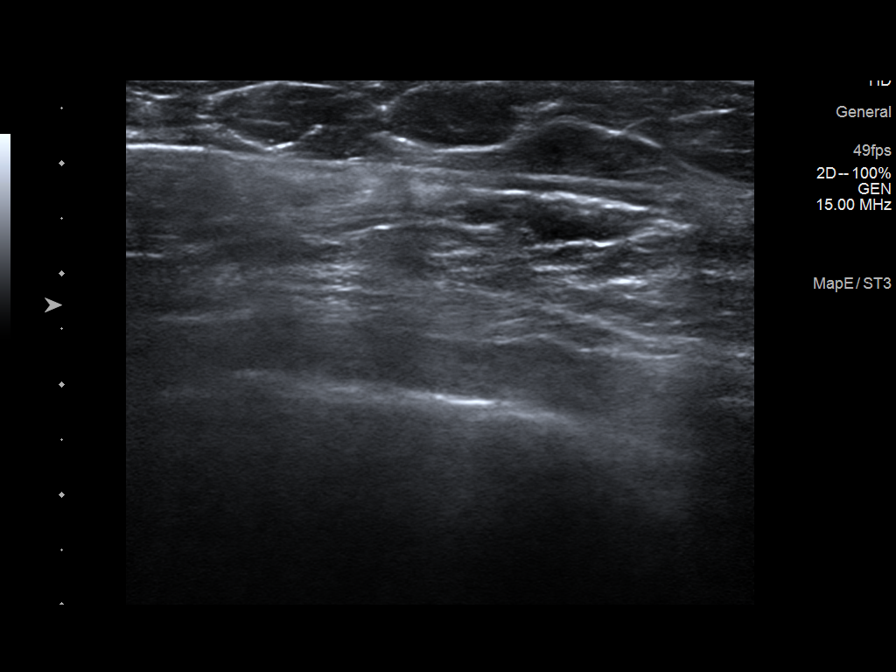
[im 8/8]
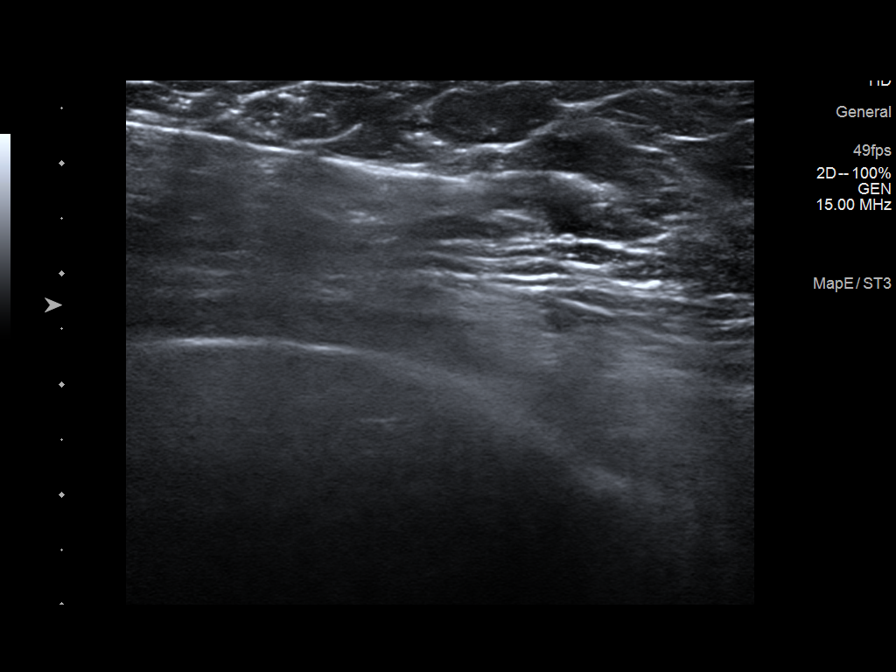

[8 of 8 positions shown; findings below may reference images not displayed]



Lesion quadrant: 7 o'clock right breast

Using sterile technique and 1% Lidocaine as local anesthetic, under
direct ultrasound visualization, a 12 gauge EMRAN device was
used to perform biopsy of a 7 o'clock right breast mass using a
lateral approach. At the conclusion of the procedure a coil shaped
tissue marker clip was deployed into the biopsy cavity. Follow up 2
view mammogram was performed and dictated separately.

Lesion quadrant: Right axilla

Using sterile technique and 1% Lidocaine as local anesthetic, under
direct ultrasound visualization, a 14 gauge EMRAN device was
used to perform biopsy of an abnormal right axillary lymph node
using a lateral approach. At the conclusion of the procedure a
tribell-shaped tissue marker clip was deployed into the biopsy
cavity. Follow up 2 view mammogram was performed and dictated
separately.
IMPRESSION: Ultrasound guided biopsy of a right 7 o'clock breast mass and a
right axillary lymph node. No apparent complications.

ADDENDUM:
Pathology revealed GRADE [DATE] INVASIVE MAMMARY CARCINOMA, PERINEURAL
INVASION of the RIGHT breast, 7 o'clock. This was found to be
concordant by Dr. EMRAN.

Pathology revealed MAMMARY CARCINOMA, CLINICALLY RIGHT AXILLARY
LYMPH NODE. There is no residual lymph node tissue present. This was
found to be concordant by Dr. EMRAN.

Pathology results were discussed with the patient by telephone. The
patient reported doing well after the biopsies with tenderness at
the sites. Post biopsy instructions and care were reviewed and
questions were answered. The patient was encouraged to call The

The patient was referred to [REDACTED]
[REDACTED] at [REDACTED] on

Strongly recommend breast MRI for extent of disease.

Pathology results reported by EMRAN RN on [DATE].



Lesion quadrant: 7 o'clock right breast

Using sterile technique and 1% Lidocaine as local anesthetic, under
direct ultrasound visualization, a 12 gauge EMRAN device was
used to perform biopsy of a 7 o'clock right breast mass using a
lateral approach. At the conclusion of the procedure a coil shaped
tissue marker clip was deployed into the biopsy cavity. Follow up 2
view mammogram was performed and dictated separately.

Lesion quadrant: Right axilla

Using sterile technique and 1% Lidocaine as local anesthetic, under
direct ultrasound visualization, a 14 gauge EMRAN device was
used to perform biopsy of an abnormal right axillary lymph node
using a lateral approach. At the conclusion of the procedure a
tribell-shaped tissue marker clip was deployed into the biopsy
cavity. Follow up 2 view mammogram was performed and dictated
separately.
IMPRESSION: Ultrasound guided biopsy of a right 7 o'clock breast mass and a
right axillary lymph node. No apparent complications.

## 2020-06-25 IMAGING — MG MM BREAST LOCALIZATION CLIP
6 series · 6 of 18 positions shown · non-contrast
Comparison: Previous exam(s).

CLINICAL DATA: Evaluate biopsy marker

EXAM:
DIAGNOSTIC RIGHT MAMMOGRAM POST ULTRASOUND BIOPSY

[R MLO synth-2D]
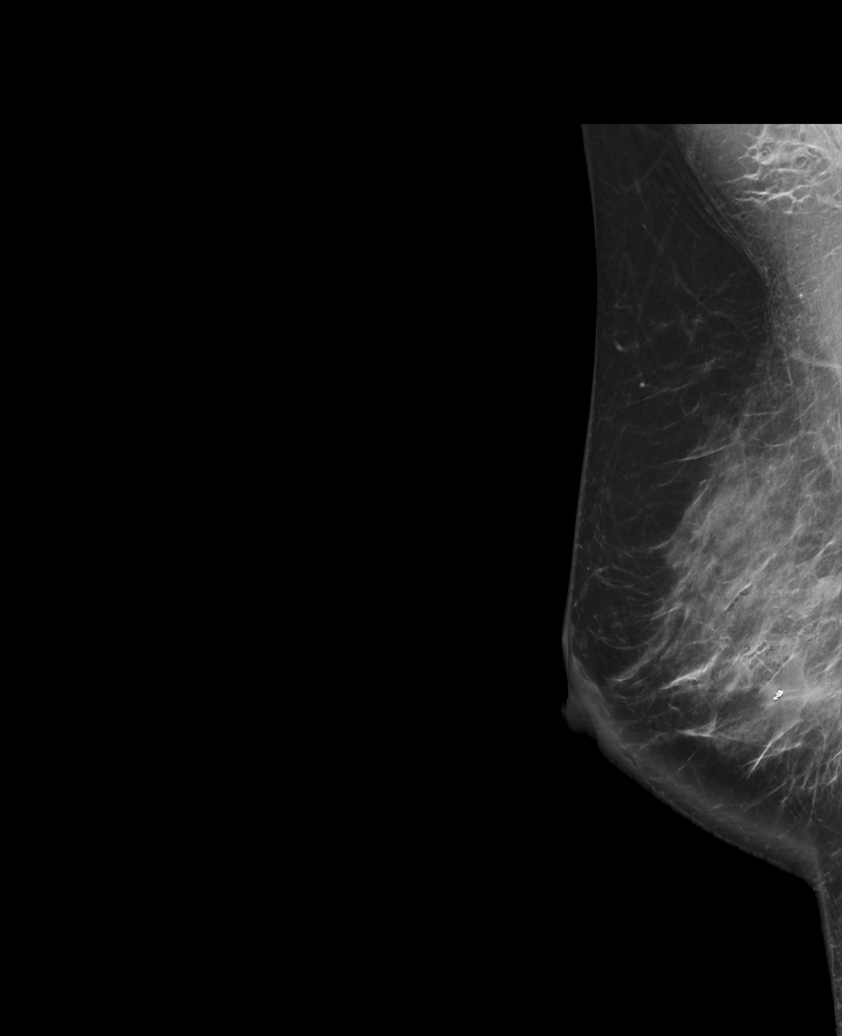

[R ML synth-2D]
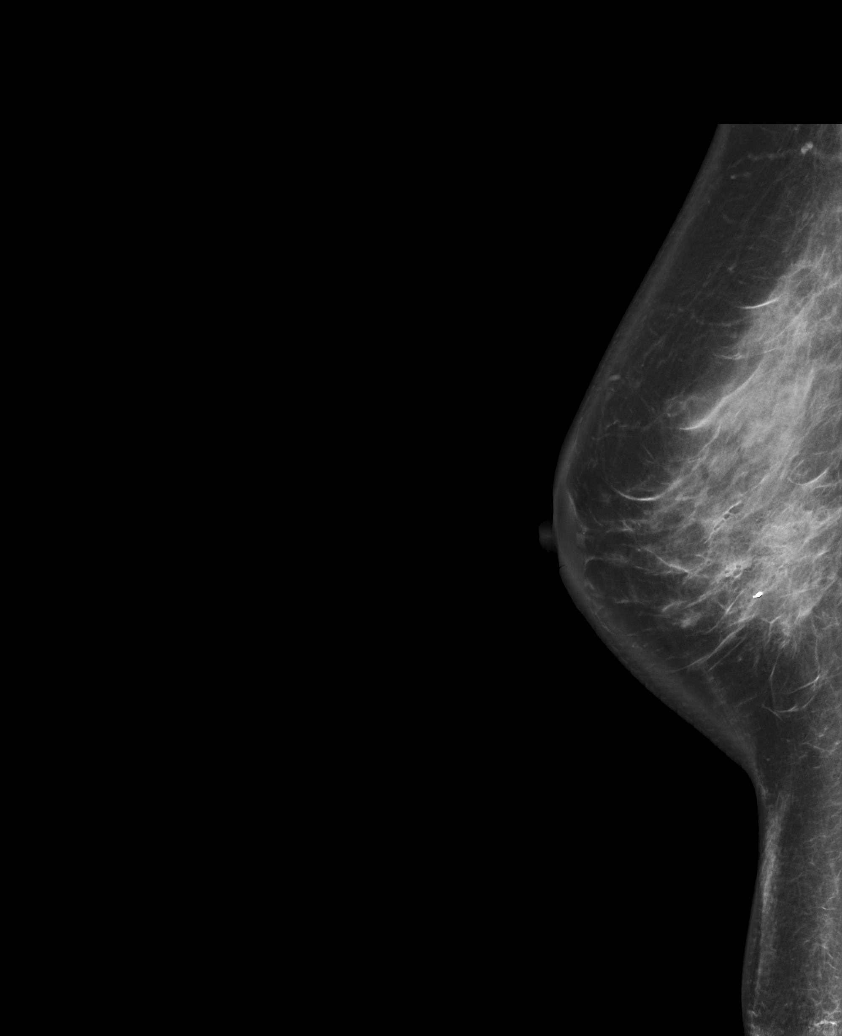

[R CC synth-2D]
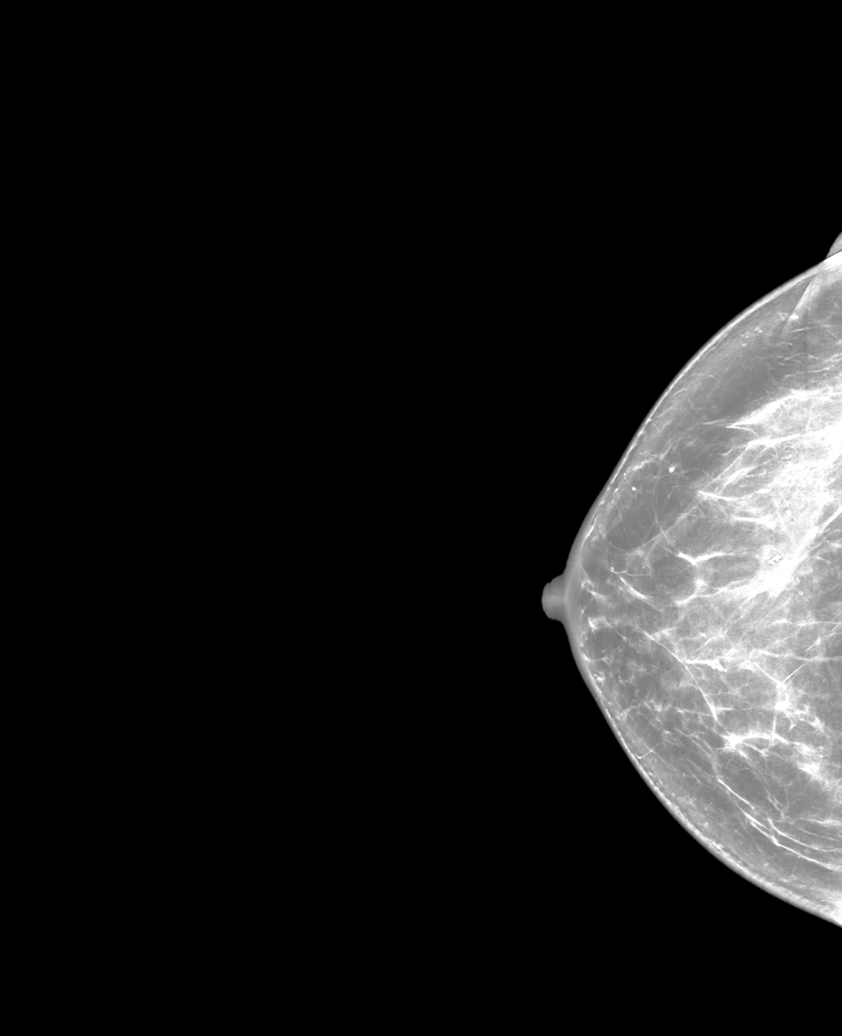

[R MLO tomo · tomo slice 46/91.0]
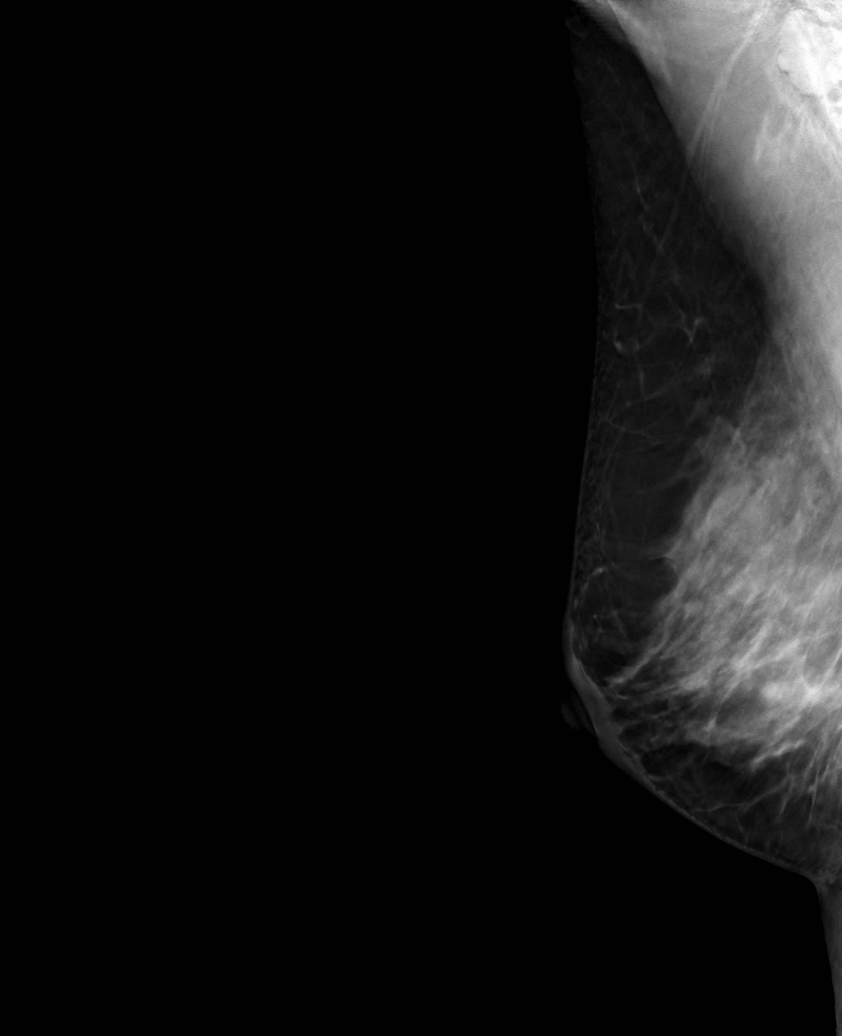

[R ML tomo · tomo slice 49/96.0]
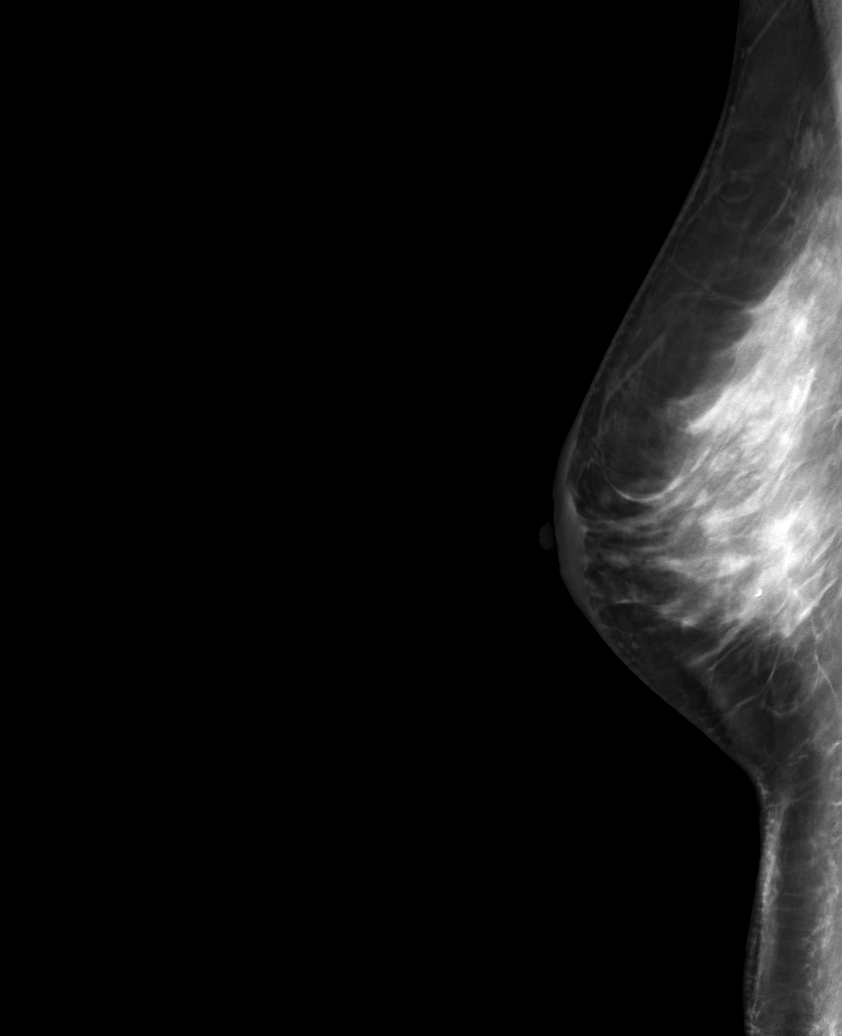

[R CC tomo · tomo slice 38/75.0]
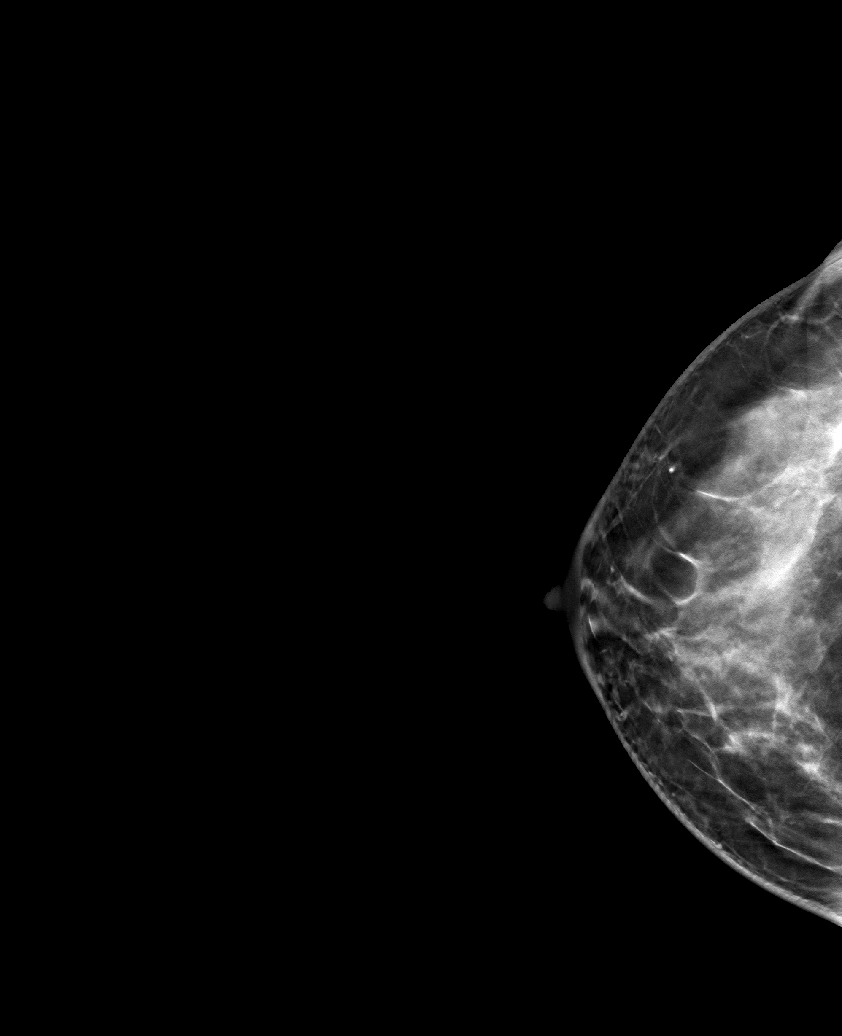

[6 of 18 positions shown; findings below may reference images not displayed]

FINDINGS: Mammographic images were obtained following ultrasound guided biopsy
of a right breast mass. The biopsy marking clip is in expected
position at the site of biopsy.
IMPRESSION: Appropriate positioning of the coil shaped biopsy marking clip at
the site of biopsy in the biopsied right breast mass. The clip
placed into an abnormal lymph node cannot be visualized
mammographically.

Final Assessment: Post Procedure Mammograms for Marker Placement

## 2020-06-27 ENCOUNTER — Encounter: Payer: Self-pay | Admitting: *Deleted

## 2020-06-27 DIAGNOSIS — C50511 Malignant neoplasm of lower-outer quadrant of right female breast: Secondary | ICD-10-CM | POA: Insufficient documentation

## 2020-06-30 ENCOUNTER — Other Ambulatory Visit: Payer: Self-pay | Admitting: Oncology

## 2020-07-01 NOTE — Progress Notes (Signed)
[place]  [phone] [phone]     ID: [name] DOB: [birth year]  MR#: [id number]  MBT#:[id number]  Patient Care Team: [name], MD as PCP - General (Obstetrics and Gynecology) Julius Boniface, Virgie Dad, MD as Consulting Physician (Oncology) Jovita Kussmaul, MD as Consulting Physician (General Surgery) [name], MD as Consulting Physician (Obstetrics and Gynecology) Mauro Kaufmann, RN as Oncology Nurse Navigator Rockwell Germany, RN as Oncology Nurse Navigator Jovita Kussmaul, MD as Consulting Physician (General Surgery) Giang Hemme, Virgie Dad, MD as Consulting Physician (Oncology) Gery Pray, MD as Consulting Physician (Radiation Oncology) Chauncey Cruel, MD OTHER MD:  CHIEF COMPLAINT: Triple positive breast cancer  CURRENT TREATMENT: Neoadjuvant chemotherapy.   HISTORY OF CURRENT ILLNESS: Kim Archer "Kim Archer" [facility]) was previously seen in the high risk breast cancer clinic on 05/11/2016. She had undergone left lumpectomy on 03/22/2016 showing atypical lobular hyperplasia. She was prescribed tamoxifen at that time, which she believes she took for two years. [She underwent hysterectomy with bilateral salpingo-oophorectomy 03/28/2019, pathology showing leiomyomas and adenomyosis, and may have stopped the tamoxifen around that time].  More recently, she had routine screening mammography on 06/11/2020 showing an area of asymmetry in the right breast. She underwent right diagnostic mammography with tomography and right breast ultrasonography at [facility] on 06/20/2020 showing: breast density category C; 2.4 cm right breast mass at 7 o'clock; one abnormal right axillary lymph node measuring 1.5 cm.  Accordingly on 06/25/2020 she proceeded to biopsy of the right breast area in question. The pathology from this procedure ([id number]) showed: invasive mammary carcinoma, e-cadherin positive, grade 2 or 3. Prognostic indicators  significant for: estrogen receptor, 40% positive and progesterone receptor, >95% positive, both with strong staining intensity. Proliferation marker Ki67 at 40%. HER2 positive by immunohistochemistry (3+).  Biopsy of the right axillary lymph node performed at the same time showed mammary carcinoma, with no residual lymph node tissue present.  Cancer Staging Malignant neoplasm of lower-outer quadrant of right breast of female, estrogen receptor positive ([place]) Staging form: Breast, AJCC 8th Edition - Clinical stage from 07/02/2020: Stage IB (cT2, cN1, cM0, G3, ER+, PR+, HER2+) - Signed by Chauncey Cruel, MD on 07/02/2020 Stage prefix: Initial diagnosis Histologic grading system: 3 grade system Laterality: Right Staged by: Pathologist and managing physician Stage used in treatment planning: Yes National guidelines used in treatment planning: Yes Type of national guideline used in treatment planning: NCCN   The patient's subsequent history is as detailed below.   INTERVAL HISTORY: Kim Archer was evaluated in the multidisciplinary breast cancer clinic on 07/02/2020 accompanied by her sister. Her case was also presented at the multidisciplinary breast cancer conference on the same day. At that time a preliminary plan was proposed: Neoadjuvant chemotherapy, breast conserving surgery with targeted axillary dissection, adjuvant radiation, antiestrogens.  Note that patient had prior genetics testing  REVIEW OF SYSTEMS: There were no specific symptoms leading to the original mammogram, which was routinely scheduled. On the provided questionnaire, Kim Archer reports wearing glasses, breast pain, and back pain. The patient denies unusual headaches, visual changes, nausea, vomiting, stiff neck, dizziness, or gait imbalance. There has been no cough, phlegm production, or pleurisy, no chest pain or pressure, and no change in bowel or bladder habits. The patient denies fever, rash, bleeding, unexplained fatigue or  unexplained weight loss. A detailed review of systems was otherwise entirely negative.   COVID 19 VACCINATION STATUS: not vaccinated; infection 10/2019   PAST MEDICAL HISTORY: Past Medical History:  Diagnosis Date  . Allergy   . Anemia    due to heavy periods  . Atypical ductal hyperplasia of left breast   . HSV-2 (herpes simplex virus 2) infection     PAST SURGICAL HISTORY: Past Surgical History:  Procedure Laterality Date  . BREAST CYST EXCISION Left 03/22/2016   ALH-BENIGN  . BREAST LUMPECTOMY WITH RADIOACTIVE SEED LOCALIZATION Left 03/22/2016   Procedure: LEFT BREAST LUMPECTOMY WITH RADIOACTIVE SEED LOCALIZATION;  Surgeon: Autumn Messing III, MD;  Location: [place];  Service: General;  Laterality: Left;  . CESAREAN SECTION     x3  . COMBINED HYSTEROSCOPY DIAGNOSTIC / D&C  07/17/2018  . EYE SURGERY     lt lens replacement  . TUBAL LIGATION      FAMILY HISTORY: Family History  Problem Relation Age of Onset  . Breast cancer Mother   . Melanoma Father   . Leukemia Sister   . Liver cancer Brother        stomach  . Breast cancer Maternal Aunt   . Colon cancer Neg Hx   . Colon polyps Neg Hx   . Esophageal cancer Neg Hx   . Rectal cancer Neg Hx   . Stomach cancer Neg Hx   The patient's father died at age 56. He had a history of melanoma. He has a brother, the patient's uncle, with prostate cancer. The patient's mother died in her 33s from urosepsis. She had a history of breast cancer diagnosed in her 35s. The patient has a brother, with cerebral palsy, who had "stomach cancer" metastatic to the liver. The patient has 2 sisters. One of them died from childhood leukemia.    GYNECOLOGIC HISTORY:  No LMP recorded (lmp unknown). Patient is postmenopausal. Menarche: 55 years old Age at first live birth: 55 years old [place] P 3 LMP 03/2019, with hysterectomy Contraceptive: used remotely for many years with no complications HRT never used  Hysterectomy? Yes, 03/2019  (path in Epic) BSO? 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 Her husband [name] (goes by "[name]") is Airline pilot for [employer], which manages apartment complexes. The patient's daughter Kearney Hard, age 91, works as a Theme park manager in [place]. She has 3 children. The patient's daughter Moody Bruins, age 90, lives in [place]. She, her husband, and their three children live with [place] and Fred. The patient's third child, Quillian Quince "Dalbert Mayotte, 56 years old, also lives in [place] and works in Architect. Patient has in total 8 grandchildren. She is a Psychologist, forensic.    ADVANCED DIRECTIVES: In the absence of any documentation to the contrary, the patient's spouse is their HCPOA.    HEALTH MAINTENANCE: Social History   Tobacco Use  . Smoking status: Never Smoker  . Smokeless tobacco: Never Used  Vaping Use  . Vaping Use: Never used  Substance Use Topics  . Alcohol use: No  . Drug use: No     Colonoscopy: 08/2018 (Dr. Ardis Hughs), recall 2030  PAP: 05/2020  Bone density: 05/2019 (Dr. Christen Butter office)   Allergies  Allergen Reactions  . Ciprofloxacin Other (See Comments)    Patient came into offoice with complaints of difficulty swallowing.  Ate at [employer] prior and took Cipro prior  . Sulfa Antibiotics Rash    Current Outpatient Medications  Medication Sig Dispense Refill  . Boron 3 MG CAPS Take by mouth.    . Calcium 500 MG tablet 1,000  mg.    . Diethylpropion HCl CR 75 MG TB24 diethylpropion ER 75 mg tablet,extended release  TAKE 1 TABLET BY MOUTH EVERY DAY    . loratadine (CLARITIN) 10 MG tablet Take 10 mg by mouth daily.    . Menatetrenone (VITAMIN K2) 100 MCG TABS Take by mouth.    . Multiple Vitamin (MULTIVITAMIN WITH MINERALS) TABS tablet Take 1 tablet by mouth daily.    Marland Kitchen Ubiquinol 50 MG CAPS Take by mouth daily.    Marland Kitchen VITAMIN D PO Vitamin D    . LUTEIN PO Take by  mouth.    . valACYclovir (VALTREX) 500 MG tablet   12   No current facility-administered medications for this visit.    OBJECTIVE: White woman who appears stated age  86:   07/02/20 0902  BP: 129/75  Pulse: 62  Resp: 18  Temp: (!) 97.5 F (36.4 C)  SpO2: 99%     Body mass index is 27.13 kg/m.   Wt Readings from Last 3 Encounters:  07/02/20 173 lb 3.2 oz (78.6 kg)  08/29/18 175 lb (79.4 kg)  08/07/18 175 lb (79.4 kg)      ECOG FS:1 - Symptomatic but completely ambulatory  Ocular: Sclerae unicteric, pupils round and equal Ear-nose-throat: Wearing a mask Lymphatic: No cervical or supraclavicular adenopathy Lungs no rales or rhonchi Heart regular rate and rhythm Abd soft, nontender, positive bowel sounds MSK no focal spinal tenderness, no joint edema Neuro: non-focal, well-oriented, appropriate affect Breasts: The right breast is status post recent biopsy.  There is a moderate ecchymosis.  I do not palpate a mass.  The left breast is status post remote biopsy.  There are no other findings of concern.  Both axillae are benign.   LAB RESULTS:  CMP     Component Value Date/Time   NA 146 (H) 07/02/2020 0826   NA 142 05/11/2016 1508   K 3.8 07/02/2020 0826   K 4.3 05/11/2016 1508   CL 107 07/02/2020 0826   CO2 29 07/02/2020 0826   CO2 26 05/11/2016 1508   GLUCOSE 74 07/02/2020 0826   GLUCOSE 90 05/11/2016 1508   BUN 14 07/02/2020 0826   BUN 10.9 05/11/2016 1508   CREATININE 0.87 07/02/2020 0826   CREATININE 1.0 05/11/2016 1508   CALCIUM 9.6 07/02/2020 0826   CALCIUM 9.5 05/11/2016 1508   PROT 7.5 07/02/2020 0826   PROT 7.0 05/11/2016 1508   ALBUMIN 4.0 07/02/2020 0826   ALBUMIN 3.9 05/11/2016 1508   AST 22 07/02/2020 0826   AST 19 05/11/2016 1508   ALT 22 07/02/2020 0826   ALT 23 05/11/2016 1508   ALKPHOS 112 07/02/2020 0826   ALKPHOS 98 05/11/2016 1508   BILITOT 0.5 07/02/2020 0826   BILITOT 0.36 05/11/2016 1508   GFRNONAA >60 07/02/2020 0826    No  results found for: TOTALPROTELP, ALBUMINELP, A1GS, A2GS, BETS, BETA2SER, GAMS, MSPIKE, SPEI  Lab Results  Component Value Date   WBC 4.7 07/02/2020   NEUTROABS 2.4 07/02/2020   HGB 14.6 07/02/2020   HCT 44.9 07/02/2020   MCV 94.5 07/02/2020   PLT 239 07/02/2020    No results found for: LABCA2  No components found for: QZRAQT622  No results for input(s): INR in the last 168 hours.  No results found for: LABCA2  No results found for: QJF354  No results found for: TGY563  No results found for: SLH734  No results found for: CA2729  No components found for: HGQUANT  No results found for:  CEA1 / No results found for: CEA1   No results found for: AFPTUMOR  No results found for: CHROMOGRNA  No results found for: KPAFRELGTCHN, LAMBDASER, KAPLAMBRATIO (kappa/lambda light chains)  No results found for: HGBA, HGBA2QUANT, HGBFQUANT, HGBSQUAN (Hemoglobinopathy evaluation)   No results found for: LDH  No results found for: IRON, TIBC, IRONPCTSAT (Iron and TIBC)  No results found for: FERRITIN  Urinalysis No results found for: COLORURINE, APPEARANCEUR, LABSPEC, PHURINE, GLUCOSEU, HGBUR, BILIRUBINUR, KETONESUR, PROTEINUR, UROBILINOGEN, NITRITE, LEUKOCYTESUR   STUDIES: US BREAST LTD UNI RIGHT INC AXILLA  Result Date: 06/20/2020 CLINICAL DATA:  55 year old female presenting as a recall from screening for possible right breast asymmetry. EXAM: DIGITAL DIAGNOSTIC UNILATERAL RIGHT MAMMOGRAM WITH TOMOSYNTHESIS AND CAD; ULTRASOUND RIGHT BREAST LIMITED TECHNIQUE: Right digital diagnostic mammography and breast tomosynthesis was performed. The images were evaluated with computer-aided detection.; Targeted ultrasound examination of the right breast was performed COMPARISON:  Previous exam(s). ACR Breast Density Category c: The breast tissue is heterogeneously dense, which may obscure small masses. FINDINGS: Mammogram: Spot compression tomosynthesis cc and MLO as well as full field mL  tomosynthesis views of the right breast were performed. There is persistence of an asymmetry with distortion in the central inferior right breast posterior depth measuring approximately 1.5 cm. There are no additional new findings elsewhere in the right breast. On physical exam in the inferior slightly outer right breast I palpate a focal thickening. Ultrasound: Targeted ultrasound performed in the right breast at 7 o'clock 3 cm from nipple demonstrating an irregular hypoechoic mass measuring 2.0 x 1.3 x 2.4 cm. This corresponds to the mammographic finding. Targeted ultrasound the right axilla demonstrates an abnormally rounded lymph node with lack of fatty hilum measuring 1.5 cm. IMPRESSION: 1. Suspicious mass in the right breast at 7 o'clock measuring 2.4 cm. 2.  Abnormal right axillary lymph node. RECOMMENDATION: Ultrasound-guided core needle biopsy x2 of the right breast mass at 7 o'clock and of the right axillary lymph node. I have discussed the findings and recommendations with the patient who agrees to proceed with biopsy. The patient will be scheduled for the biopsy appointment prior to leaving the office today. BI-RADS CATEGORY  5: Highly suggestive of malignancy. Electronically Signed   By: Audie Pinto M.D.   On: 06/20/2020 11:02   MM DIAG BREAST TOMO UNI RIGHT  Result Date: 06/20/2020 CLINICAL DATA:  55 year old female presenting as a recall from screening for possible right breast asymmetry. EXAM: DIGITAL DIAGNOSTIC UNILATERAL RIGHT MAMMOGRAM WITH TOMOSYNTHESIS AND CAD; ULTRASOUND RIGHT BREAST LIMITED TECHNIQUE: Right digital diagnostic mammography and breast tomosynthesis was performed. The images were evaluated with computer-aided detection.; Targeted ultrasound examination of the right breast was performed COMPARISON:  Previous exam(s). ACR Breast Density Category c: The breast tissue is heterogeneously dense, which may obscure small masses. FINDINGS: Mammogram: Spot compression tomosynthesis  cc and MLO as well as full field mL tomosynthesis views of the right breast were performed. There is persistence of an asymmetry with distortion in the central inferior right breast posterior depth measuring approximately 1.5 cm. There are no additional new findings elsewhere in the right breast. On physical exam in the inferior slightly outer right breast I palpate a focal thickening. Ultrasound: Targeted ultrasound performed in the right breast at 7 o'clock 3 cm from nipple demonstrating an irregular hypoechoic mass measuring 2.0 x 1.3 x 2.4 cm. This corresponds to the mammographic finding. Targeted ultrasound the right axilla demonstrates an abnormally rounded lymph node with lack of fatty hilum measuring 1.5 cm. IMPRESSION:  1. Suspicious mass in the right breast at 7 o'clock measuring 2.4 cm. 2.  Abnormal right axillary lymph node. RECOMMENDATION: Ultrasound-guided core needle biopsy x2 of the right breast mass at 7 o'clock and of the right axillary lymph node. I have discussed the findings and recommendations with the patient who agrees to proceed with biopsy. The patient will be scheduled for the biopsy appointment prior to leaving the office today. BI-RADS CATEGORY  5: Highly suggestive of malignancy. Electronically Signed   By: Audie Pinto M.D.   On: 06/20/2020 11:02   [place] AXILLARY NODE CORE BIOPSY RIGHT  Addendum Date: 06/29/2020   ADDENDUM REPORT: 06/26/2020 13:02 ADDENDUM: Pathology revealed GRADE 2/3 INVASIVE MAMMARY CARCINOMA, PERINEURAL INVASION of the RIGHT breast, 7 o'clock. This was found to be concordant by Dr. Dorise Bullion. Pathology revealed MAMMARY CARCINOMA, CLINICALLY RIGHT AXILLARY LYMPH NODE. There is no residual lymph node tissue present. This was found to be concordant by Dr. Dorise Bullion. Pathology results were discussed with the patient by telephone. The patient reported doing well after the biopsies with tenderness at the sites. Post biopsy instructions and care were  reviewed and questions were answered. The patient was encouraged to call [facility] for any additional concerns. The patient was referred to [facility] on Jul 02, 2020. Strongly recommend breast MRI for extent of disease. Pathology results reported by Stacie Acres RN on 06/26/2020. Electronically Signed   By: Dorise Bullion III M.D   On: 06/26/2020 13:02   Result Date: 06/29/2020 CLINICAL DATA:  Biopsy of a 7 o'clock right breast mass and an abnormal right axillary lymph node. EXAM: ULTRASOUND GUIDED RIGHT BREAST CORE NEEDLE BIOPSY COMPARISON:  Previous exam(s). PROCEDURE: I met with the patient and we discussed the procedure of ultrasound-guided biopsy, including benefits and alternatives. We discussed the high likelihood of a successful procedure. We discussed the risks of the procedure, including infection, bleeding, tissue injury, clip migration, and inadequate sampling. Informed written consent was given. The usual time-out protocol was performed immediately prior to the procedure. Lesion quadrant: 7 o'clock right breast Using sterile technique and 1% Lidocaine as local anesthetic, under direct ultrasound visualization, a 12 gauge spring-loaded device was used to perform biopsy of a 7 o'clock right breast mass using a lateral approach. At the conclusion of the procedure a coil shaped tissue marker clip was deployed into the biopsy cavity. Follow up 2 view mammogram was performed and dictated separately. Lesion quadrant: Right axilla Using sterile technique and 1% Lidocaine as local anesthetic, under direct ultrasound visualization, a 14 gauge spring-loaded device was used to perform biopsy of an abnormal right axillary lymph node using a lateral approach. At the conclusion of the procedure a tribell-shaped tissue marker clip was deployed into the biopsy cavity. Follow up 2 view mammogram was performed and  dictated separately. IMPRESSION: Ultrasound guided biopsy of a right 7 o'clock breast mass and a right axillary lymph node. No apparent complications. Electronically Signed: By: Dorise Bullion III M.D On: 06/25/2020 10:04   MM CLIP PLACEMENT RIGHT  Result Date: 06/25/2020 CLINICAL DATA:  Evaluate biopsy marker EXAM: DIAGNOSTIC RIGHT MAMMOGRAM POST ULTRASOUND BIOPSY COMPARISON:  Previous exam(s). FINDINGS: Mammographic images were obtained following ultrasound guided biopsy of a right breast mass. The biopsy marking clip is in expected position at the site of biopsy. IMPRESSION: Appropriate positioning of the coil shaped biopsy marking clip at the site of biopsy in the biopsied  right breast mass. The clip placed into an abnormal lymph node cannot be visualized mammographically. Final Assessment: Post Procedure Mammograms for Marker Placement Electronically Signed   By: Dorise Bullion III M.D   On: 06/25/2020 10:39   [place] RT BREAST BX W LOC DEV 1ST LESION IMG BX SPEC US GUIDE  Addendum Date: 06/29/2020   ADDENDUM REPORT: 06/26/2020 13:02 ADDENDUM: Pathology revealed GRADE 2/3 INVASIVE MAMMARY CARCINOMA, PERINEURAL INVASION of the RIGHT breast, 7 o'clock. This was found to be concordant by Dr. Dorise Bullion. Pathology revealed MAMMARY CARCINOMA, CLINICALLY RIGHT AXILLARY LYMPH NODE. There is no residual lymph node tissue present. This was found to be concordant by Dr. Dorise Bullion. Pathology results were discussed with the patient by telephone. The patient reported doing well after the biopsies with tenderness at the sites. Post biopsy instructions and care were reviewed and questions were answered. The patient was encouraged to call [facility] for any additional concerns. The patient was referred to [facility] on Jul 02, 2020. Strongly recommend breast MRI for extent of disease. Pathology results  reported by Stacie Acres RN on 06/26/2020. Electronically Signed   By: Dorise Bullion III M.D   On: 06/26/2020 13:02   Result Date: 06/29/2020 CLINICAL DATA:  Biopsy of a 7 o'clock right breast mass and an abnormal right axillary lymph node. EXAM: ULTRASOUND GUIDED RIGHT BREAST CORE NEEDLE BIOPSY COMPARISON:  Previous exam(s). PROCEDURE: I met with the patient and we discussed the procedure of ultrasound-guided biopsy, including benefits and alternatives. We discussed the high likelihood of a successful procedure. We discussed the risks of the procedure, including infection, bleeding, tissue injury, clip migration, and inadequate sampling. Informed written consent was given. The usual time-out protocol was performed immediately prior to the procedure. Lesion quadrant: 7 o'clock right breast Using sterile technique and 1% Lidocaine as local anesthetic, under direct ultrasound visualization, a 12 gauge spring-loaded device was used to perform biopsy of a 7 o'clock right breast mass using a lateral approach. At the conclusion of the procedure a coil shaped tissue marker clip was deployed into the biopsy cavity. Follow up 2 view mammogram was performed and dictated separately. Lesion quadrant: Right axilla Using sterile technique and 1% Lidocaine as local anesthetic, under direct ultrasound visualization, a 14 gauge spring-loaded device was used to perform biopsy of an abnormal right axillary lymph node using a lateral approach. At the conclusion of the procedure a tribell-shaped tissue marker clip was deployed into the biopsy cavity. Follow up 2 view mammogram was performed and dictated separately. IMPRESSION: Ultrasound guided biopsy of a right 7 o'clock breast mass and a right axillary lymph node. No apparent complications. Electronically Signed: By: Dorise Bullion III M.D On: 06/25/2020 10:04     ELIGIBLE FOR AVAILABLE RESEARCH PROTOCOL: No  ASSESSMENT: 55 y.o. [place], [place] woman  (1) status post left  lumpectomy 03/22/2016 showing atypical lobular hyperplasia  (2) prophylactic tamoxifen taken from 04/2016 through 03/2019  (a) status post hysterectomy with bilateral salpingo-oophorectomy February 2021  (3) genetic testing 05/22/2019 through the my risk hereditary cancer panel offered by [facility] found no deleterious mutations in BRCA, TP53, PALB2 or any of the other genes tested  (a) variants of uncertain significance were found in BRIP1 and NBN  (4) neoadjuvant chemotherapy will consist of carboplatin, docetaxel, trastuzumab and pertuzumab every 21 days x 6 starting July 25, 2019  (5) anti-HER2 treatment to be continued to complete 1  year  (a) echocardiogram  (6) definitive surgery to follow  (7) adjuvant radiation  (8) anastrozole to start at the completion of local treatment    PLAN: I met today with Tyshika to review her new diagnosis. Specifically we discussed the biology of her breast cancer, its diagnosis, staging, treatment  options and prognosis.We first reviewed the fact that cancer is not one disease but more than 100 different diseases and that it is important to keep them separate-- otherwise when friends and relatives discuss their own cancer experiences with Anairis confusion can result. Similarly we explained that if breast cancer spreads to the bone or liver, the patient would not have bone cancer or liver cancer, but breast cancer in the bone and breast cancer in the liver: one cancer in three places-- not 3 different cancers which otherwise would have to be treated in 3 different ways.  We discussed the difference between local and systemic therapy. In terms of loco-regional treatment, lumpectomy plus radiation is equivalent to mastectomy as far as survival is concerned. For this reason, and because the cosmetic results are generally superior, we recommend breast conserving surgery.   We also noted that in terms of sequencing of treatments, whether systemic therapy or  surgery is done first does not affect the ultimate outcome.  This is relevant to her situation since we believe she will benefit from neoadjuvant treatment.  This will make the surgery easier, with better cosmesis, increase the chances of breast salvage, help her avoid a full axillary lymph node dissection and also give [place] prognostic information.  We then discussed the rationale for systemic therapy. There is some risk that this cancer may have already spread to other parts of her body. Patients frequently ask at this point about bone scans, CAT scans and PET scans to find out if they have occult breast cancer somewhere else. The problem is that in early stage disease we are much more likely to find false positives then true cancers and this would expose the patient to unnecessary procedures as well as unnecessary radiation. Scans cannot answer the question the patient really would like to know, which is whether she has microscopic disease elsewhere in her body. For those reasons we do not recommend them.  Of course we would proceed to aggressive evaluation of any symptoms that might suggest metastatic disease, but that is not the case here.  Next we went over the options for systemic therapy which are anti-estrogens, anti-HER-2 immunotherapy, and chemotherapy. Thora is a good candidate for all 3 and we specifically discussed the standard protocol of trastuzumab and pertuzumab together with carboplatin and docetaxel x6, to be followed by continuing anti-HER2 immunotherapy to total 1 year.  She has an initial understanding of the possible toxicities side effects and complications of these agents and I specifically suggested she consider the [employer]igna cap system.  She will need a port, an echo, a breast MRI at baseline and at completion of chemotherapy, and meeting with our chemotherapy teaching nurse.  Tentative start date is 07/24/2020 as the patient will be out of town for a wedding in [place]  shortly before that and she intends to continue to work right through chemotherapy  Finn has a good understanding of the overall plan. She knows the goal of treatment in her case is cure. She will call with any problems that may develop before her next visit here.  Total encounter time 70 minutes.Sarajane Jews C. Torina Ey, MD 07/02/2020 6:15 PM Medical  Oncology and Hematology [facility] [place], [place] [address] Tel. [phone]    Fax. [phone]   This document serves as a record of services personally performed by Lurline Del, MD. It was created on his behalf by Wilburn Mylar, a trained medical scribe. The creation of this record is based on the scribe's personal observations and the provider's statements to them.   I, Lurline Del MD, have reviewed the above documentation for accuracy and completeness, and I agree with the above.    *Total Encounter Time as defined by the Centers for Medicare and Medicaid Services includes, in addition to the face-to-face time of a patient visit (documented in the note above) non-face-to-face time: obtaining and reviewing outside history, ordering and reviewing medications, tests or procedures, care coordination (communications with other health care professionals or caregivers) and documentation in the medical record.

## 2020-07-02 ENCOUNTER — Other Ambulatory Visit: Payer: BC Managed Care – PPO

## 2020-07-02 ENCOUNTER — Encounter: Payer: Self-pay | Admitting: *Deleted

## 2020-07-02 ENCOUNTER — Encounter: Payer: 59 | Admitting: Genetic Counselor

## 2020-07-02 ENCOUNTER — Ambulatory Visit: Payer: 59 | Attending: General Surgery | Admitting: Physical Therapy

## 2020-07-02 ENCOUNTER — Encounter: Payer: Self-pay | Admitting: Genetic Counselor

## 2020-07-02 ENCOUNTER — Other Ambulatory Visit: Payer: Self-pay

## 2020-07-02 ENCOUNTER — Encounter: Payer: Self-pay | Admitting: Licensed Clinical Social Worker

## 2020-07-02 ENCOUNTER — Ambulatory Visit
Admission: RE | Admit: 2020-07-02 | Discharge: 2020-07-02 | Disposition: A | Discharge status: Home / Self Care | Payer: 59 | Source: Ambulatory Visit | Attending: Radiation Oncology | Admitting: Radiation Oncology

## 2020-07-02 ENCOUNTER — Other Ambulatory Visit: Payer: Self-pay | Admitting: *Deleted

## 2020-07-02 ENCOUNTER — Ambulatory Visit: Payer: Self-pay | Admitting: General Surgery

## 2020-07-02 ENCOUNTER — Encounter: Payer: Self-pay | Admitting: Physical Therapy

## 2020-07-02 ENCOUNTER — Inpatient Hospital Stay: Payer: 59 | Attending: Oncology | Admitting: Oncology

## 2020-07-02 ENCOUNTER — Inpatient Hospital Stay: Payer: 59

## 2020-07-02 VITALS — BP 129/75 | HR 62 | Temp 97.5°F | Resp 18 | Ht 67.0 in | Wt 173.2 lb

## 2020-07-02 DIAGNOSIS — M79601 Pain in right arm: Secondary | ICD-10-CM | POA: Diagnosis present

## 2020-07-02 DIAGNOSIS — Z170 Estrogen receptor positive status [ER+]: Secondary | ICD-10-CM

## 2020-07-02 DIAGNOSIS — Z807 Family history of other malignant neoplasms of lymphoid, hematopoietic and related tissues: Secondary | ICD-10-CM | POA: Insufficient documentation

## 2020-07-02 DIAGNOSIS — Z8042 Family history of malignant neoplasm of prostate: Secondary | ICD-10-CM | POA: Insufficient documentation

## 2020-07-02 DIAGNOSIS — C50511 Malignant neoplasm of lower-outer quadrant of right female breast: Secondary | ICD-10-CM

## 2020-07-02 DIAGNOSIS — R293 Abnormal posture: Secondary | ICD-10-CM | POA: Diagnosis present

## 2020-07-02 DIAGNOSIS — Z803 Family history of malignant neoplasm of breast: Secondary | ICD-10-CM | POA: Diagnosis not present

## 2020-07-02 DIAGNOSIS — Z800 Family history of malignant neoplasm of digestive organs: Secondary | ICD-10-CM | POA: Insufficient documentation

## 2020-07-02 DIAGNOSIS — C773 Secondary and unspecified malignant neoplasm of axilla and upper limb lymph nodes: Secondary | ICD-10-CM | POA: Insufficient documentation

## 2020-07-02 LAB — CBC WITH DIFFERENTIAL (CANCER CENTER ONLY)
Abs Immature Granulocytes: 0.02 10*3/uL (ref 0.00–0.07)
Basophils Absolute: 0 10*3/uL (ref 0.0–0.1)
Basophils Relative: 1 %
Eosinophils Absolute: 0.2 10*3/uL (ref 0.0–0.5)
Eosinophils Relative: 3 %
HCT: 44.9 % (ref 36.0–46.0)
Hemoglobin: 14.6 g/dL (ref 12.0–15.0)
Immature Granulocytes: 0 %
Lymphocytes Relative: 39 %
Lymphs Abs: 1.8 10*3/uL (ref 0.7–4.0)
MCH: 30.7 pg (ref 26.0–34.0)
MCHC: 32.5 g/dL (ref 30.0–36.0)
MCV: 94.5 fL (ref 80.0–100.0)
Monocytes Absolute: 0.3 10*3/uL (ref 0.1–1.0)
Monocytes Relative: 6 %
Neutro Abs: 2.4 10*3/uL (ref 1.7–7.7)
Neutrophils Relative %: 51 %
Platelet Count: 239 10*3/uL (ref 150–400)
RBC: 4.75 MIL/uL (ref 3.87–5.11)
RDW: 12.3 % (ref 11.5–15.5)
WBC Count: 4.7 10*3/uL (ref 4.0–10.5)
nRBC: 0 % (ref 0.0–0.2)

## 2020-07-02 LAB — GENETIC SCREENING ORDER

## 2020-07-02 LAB — CMP (CANCER CENTER ONLY)
ALT: 22 U/L (ref 0–44)
AST: 22 U/L (ref 15–41)
Albumin: 4 g/dL (ref 3.5–5.0)
Alkaline Phosphatase: 112 U/L (ref 38–126)
Anion gap: 10 (ref 5–15)
BUN: 14 mg/dL (ref 6–20)
CO2: 29 mmol/L (ref 22–32)
Calcium: 9.6 mg/dL (ref 8.9–10.3)
Chloride: 107 mmol/L (ref 98–111)
Creatinine: 0.87 mg/dL (ref 0.44–1.00)
GFR, Estimated: >60 mL/min (ref >60)
Glucose, Bld: 74 mg/dL (ref 70–99)
Potassium: 3.8 mmol/L (ref 3.5–5.1)
Sodium: 146 mmol/L — ABNORMAL HIGH (ref 135–145)
Total Bilirubin: 0.5 mg/dL (ref 0.3–1.2)
Total Protein: 7.5 g/dL (ref 6.5–8.1)

## 2020-07-02 MED ORDER — DEXAMETHASONE 4 MG PO TABS: 8.0000 mg | ORAL_TABLET | Freq: Two times a day (BID) | ORAL | 1 refills | Status: DC

## 2020-07-02 MED ORDER — PROCHLORPERAZINE MALEATE 10 MG PO TABS: 10.0000 mg | ORAL_TABLET | Freq: Four times a day (QID) | ORAL | 1 refills | Status: DC | PRN

## 2020-07-02 MED ORDER — LIDOCAINE-PRILOCAINE 2.5-2.5 % EX CREA: TOPICAL_CREAM | CUTANEOUS | 3 refills | Status: DC

## 2020-07-02 NOTE — Patient Instructions (Signed)

## 2020-07-02 NOTE — Progress Notes (Signed)
START ON PATHWAY REGIMEN - Breast     A cycle is every 21 days:     Pertuzumab      Pertuzumab      Trastuzumab-xxxx      Trastuzumab-xxxx      Carboplatin      Docetaxel   **Always confirm dose/schedule in your pharmacy ordering system**  Patient Characteristics: Preoperative or Nonsurgical Candidate (Clinical Staging), Neoadjuvant Therapy followed by Surgery, Invasive Disease, Chemotherapy, HER2 Positive, ER Positive Therapeutic Status: Preoperative or Nonsurgical Candidate (Clinical Staging) AJCC M Category: cM0 AJCC Grade: G3 Breast Surgical Plan: Neoadjuvant Therapy followed by Surgery ER Status: Positive (+) AJCC 8 Stage Grouping: IB HER2 Status: Positive (+) AJCC T Category: cT2 AJCC N Category: cN1 PR Status: Positive (+) Intent of Therapy: Curative Intent, Discussed with Patient

## 2020-07-02 NOTE — Addendum Note (Signed)
Addended by: Chauncey Cruel on: 07/02/2020 06:54 PM   Modules accepted: Orders

## 2020-07-02 NOTE — Progress Notes (Signed)
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 female who is presenting to the office today for evaluation of her newly diagnosed breast cancer. She is accompanied by her sister.   She had routine screening mammography on 06/20/20 showing a possible abnormality in the right breast. She underwent right breast diagnostic mammography with tomography and right breast ultrasonography at [facility] on 06/20/20 showing a suspicious mass in the right breast at 7 o'clock measuring 2.4 cm. .  Biopsy on 06/25/20 showed a grade 2 invasive ductal carcinoma in the 7 o'clock region. The biopsied lymph node also showed invasive ductal carcinoma. Prognostic indicators significant for: estrogen receptor, 40% positive and progesterone receptor, 95% positive, both with strong staining intensity. Proliferation marker Ki67 40%,HER2 positive.  Menarche: 55 years old Age at first live birth: 55 years old GP: 3 LMP: Feb, 2021 Contraceptive: Yes, N/A HRT: No   The patient was referred today for presentation in the multidisciplinary conference.  Radiology studies and pathology slides were presented there for review and discussion of treatment options.  A consensus was discussed regarding potential next steps.  PREVIOUS RADIATION THERAPY: No  PAST MEDICAL HISTORY:  Past Medical History:  Diagnosis Date  . Allergy   . Anemia    due to heavy periods  . Atypical ductal hyperplasia of left breast   . HSV-2 (herpes simplex virus 2) infection     PAST SURGICAL  HISTORY: Past Surgical History:  Procedure Laterality Date  . BREAST CYST EXCISION Left 03/22/2016   ALH-BENIGN  . BREAST LUMPECTOMY WITH RADIOACTIVE SEED LOCALIZATION Left 03/22/2016   Procedure: LEFT BREAST LUMPECTOMY WITH RADIOACTIVE SEED LOCALIZATION;  Surgeon: Autumn Messing III, MD;  Location: [facility];  Service: General;  Laterality: Left;  . CESAREAN SECTION     x3  . COMBINED HYSTEROSCOPY DIAGNOSTIC / D&C  07/17/2018  . EYE SURGERY     lt lens replacement  . TUBAL LIGATION      FAMILY HISTORY:  Family History  Problem Relation Age of Onset  . Breast cancer Mother   . Melanoma Father   . Leukemia Sister   . Liver cancer Brother        stomach  . Breast cancer Maternal Aunt   . Colon cancer Neg Hx   . Colon polyps Neg Hx   . Esophageal cancer Neg Hx   . Rectal cancer Neg Hx   . Stomach cancer Neg Hx     SOCIAL HISTORY:  Social History   Socioeconomic History  . Marital status: Married    Spouse name: Not on file  . Number of children: Not on file  . Years of education: Not on file  . Highest education level: Not on file  Occupational History  . Not on file  Tobacco Use  . Smoking status: Never Smoker  . Smokeless tobacco: Never Used  Vaping Use  . Vaping Use: Never used  Substance and Sexual Activity  . Alcohol use: No  . Drug use: No  . Sexual activity: Not on file  Comment: tubal ligation  Other Topics Concern  . Not on file  Social History Narrative  . Not on file   Social Determinants of Health   Financial Resource Strain: Low Risk   . Difficulty of Paying Living Expenses: Not very hard  Food Insecurity: No Food Insecurity  . Worried About Charity fundraiser in the Last Year: Never true  . Ran Out of Food in the Last Year: Never true  Transportation Needs: No Transportation Needs  . Lack of Transportation (Medical): No  . Lack of Transportation (Non-Medical): No  Physical Activity: Not on file  Stress: Not on file  Social  Connections: Not on file    ALLERGIES:  Allergies  Allergen Reactions  . Ciprofloxacin Other (See Comments)    Patient came into offoice with complaints of difficulty swallowing.  Ate at [employer] prior and took Cipro prior  . Sulfa Antibiotics Rash    MEDICATIONS:  Current Outpatient Medications  Medication Sig Dispense Refill  . Boron 3 MG CAPS Take by mouth.    . Calcium 500 MG tablet 1,000 mg.    . Diethylpropion HCl CR 75 MG TB24 diethylpropion ER 75 mg tablet,extended release  TAKE 1 TABLET BY MOUTH EVERY DAY    . loratadine (CLARITIN) 10 MG tablet Take 10 mg by mouth daily.    . LUTEIN PO Take by mouth.    . Menatetrenone (VITAMIN K2) 100 MCG TABS Take by mouth.    . Multiple Vitamin (MULTIVITAMIN WITH MINERALS) TABS tablet Take 1 tablet by mouth daily.    Marland Kitchen Ubiquinol 50 MG CAPS Take by mouth daily.    . valACYclovir (VALTREX) 500 MG tablet   12  . VITAMIN D PO Vitamin D     No current facility-administered medications for this encounter.    REVIEW OF SYSTEMS: A 10+ POINT REVIEW OF SYSTEMS WAS OBTAINED including neurology, dermatology, psychiatry, cardiac, respiratory, lymph, extremities, GI, GU, musculoskeletal, constitutional, reproductive, HEENT. On the provided form, she reports wearing glasses, lump and pain in breasts (unspecified), and back pain. She denies any other symptoms.  No reports of nipple discharge or bleeding   PHYSICAL EXAM:   Vitals with BMI 07/02/2020  Height '5\' 7"'   Weight 173 lbs 3 oz  BMI 12.45  Systolic 809  Diastolic 75  Pulse 62     Lungs are clear to auscultation bilaterally. Heart has regular rate and rhythm. No palpable cervical, supraclavicular, or axillary adenopathy. Abdomen soft, non-tender, with normal bowel sounds. Left breast with no palpable mass, nipple discharge, or bleeding, faint scar in the inframammary fold area, and has a prior benign biopsy. Right breast has a palpable induration in the 7 o'clock position of breat  measuring 2 cm in size. There is associated bruising in this area from biopsy. No nipple discharge or bleeding.    KPS = 90  100 - Normal; no complaints; no evidence of disease. 90   - Able to carry on normal activity; minor signs or symptoms of disease. 80   - Normal activity with effort; some signs or symptoms of disease. 54   - Cares for self; unable to carry on normal activity or to do active work. 60   - Requires occasional assistance, but is able to care for most of his personal needs. 50   - Requires considerable assistance and frequent medical care. 76   - Disabled; requires special care and assistance. 69   - Severely disabled; hospital admission is indicated although death  not imminent. 21   - Very sick; hospital admission necessary; active supportive treatment necessary. 10   - Moribund; fatal processes progressing rapidly. 0     - Dead  Karnofsky DA, Abelmann [place], Craver LS and Burchenal [facility] [phone]) The use of the nitrogen mustards in the palliative treatment of carcinoma: with particular reference to bronchogenic carcinoma Cancer 1 634-56  LABORATORY DATA:  Lab Results  Component Value Date   WBC 4.7 07/02/2020   HGB 14.6 07/02/2020   HCT 44.9 07/02/2020   MCV 94.5 07/02/2020   PLT 239 07/02/2020   Lab Results  Component Value Date   NA 146 (H) 07/02/2020   K 3.8 07/02/2020   CL 107 07/02/2020   CO2 29 07/02/2020   Lab Results  Component Value Date   ALT 22 07/02/2020   AST 22 07/02/2020   ALKPHOS 112 07/02/2020   BILITOT 0.5 07/02/2020    PULMONARY FUNCTION TEST:   Recent Review Flowsheet Data   There is no flowsheet data to display.     RADIOGRAPHY: US BREAST LTD UNI RIGHT INC AXILLA  Result Date: 06/20/2020 CLINICAL DATA:  55 year old female presenting as a recall from screening for possible right breast asymmetry. EXAM: DIGITAL DIAGNOSTIC UNILATERAL RIGHT MAMMOGRAM WITH TOMOSYNTHESIS AND CAD; ULTRASOUND RIGHT BREAST LIMITED TECHNIQUE: Right digital  diagnostic mammography and breast tomosynthesis was performed. The images were evaluated with computer-aided detection.; Targeted ultrasound examination of the right breast was performed COMPARISON:  Previous exam(s). ACR Breast Density Category c: The breast tissue is heterogeneously dense, which may obscure small masses. FINDINGS: Mammogram: Spot compression tomosynthesis cc and MLO as well as full field mL tomosynthesis views of the right breast were performed. There is persistence of an asymmetry with distortion in the central inferior right breast posterior depth measuring approximately 1.5 cm. There are no additional new findings elsewhere in the right breast. On physical exam in the inferior slightly outer right breast I palpate a focal thickening. Ultrasound: Targeted ultrasound performed in the right breast at 7 o'clock 3 cm from nipple demonstrating an irregular hypoechoic mass measuring 2.0 x 1.3 x 2.4 cm. This corresponds to the mammographic finding. Targeted ultrasound the right axilla demonstrates an abnormally rounded lymph node with lack of fatty hilum measuring 1.5 cm. IMPRESSION: 1. Suspicious mass in the right breast at 7 o'clock measuring 2.4 cm. 2.  Abnormal right axillary lymph node. RECOMMENDATION: Ultrasound-guided core needle biopsy x2 of the right breast mass at 7 o'clock and of the right axillary lymph node. I have discussed the findings and recommendations with the patient who agrees to proceed with biopsy. The patient will be scheduled for the biopsy appointment prior to leaving the office today. BI-RADS CATEGORY  5: Highly suggestive of malignancy. Electronically Signed   By: Audie Pinto M.D.   On: 06/20/2020 11:02   MM DIAG BREAST TOMO UNI RIGHT  Result Date: 06/20/2020 CLINICAL DATA:  55 year old female presenting as a recall from screening for possible right breast asymmetry. EXAM: DIGITAL DIAGNOSTIC UNILATERAL RIGHT MAMMOGRAM WITH TOMOSYNTHESIS AND CAD; ULTRASOUND RIGHT  BREAST LIMITED TECHNIQUE: Right digital diagnostic mammography and breast tomosynthesis was performed. The images were evaluated with computer-aided detection.; Targeted ultrasound examination of the right breast was performed COMPARISON:  Previous exam(s). ACR Breast Density Category c: The breast tissue is heterogeneously dense, which may obscure small masses. FINDINGS: Mammogram: Spot compression tomosynthesis cc and MLO as well as full field mL tomosynthesis views of the right breast were performed. There is persistence of an  asymmetry with distortion in the central inferior right breast posterior depth measuring approximately 1.5 cm. There are no additional new findings elsewhere in the right breast. On physical exam in the inferior slightly outer right breast I palpate a focal thickening. Ultrasound: Targeted ultrasound performed in the right breast at 7 o'clock 3 cm from nipple demonstrating an irregular hypoechoic mass measuring 2.0 x 1.3 x 2.4 cm. This corresponds to the mammographic finding. Targeted ultrasound the right axilla demonstrates an abnormally rounded lymph node with lack of fatty hilum measuring 1.5 cm. IMPRESSION: 1. Suspicious mass in the right breast at 7 o'clock measuring 2.4 cm. 2.  Abnormal right axillary lymph node. RECOMMENDATION: Ultrasound-guided core needle biopsy x2 of the right breast mass at 7 o'clock and of the right axillary lymph node. I have discussed the findings and recommendations with the patient who agrees to proceed with biopsy. The patient will be scheduled for the biopsy appointment prior to leaving the office today. BI-RADS CATEGORY  5: Highly suggestive of malignancy. Electronically Signed   By: Audie Pinto M.D.   On: 06/20/2020 11:02   [place] AXILLARY NODE CORE BIOPSY RIGHT  Addendum Date: 06/29/2020   ADDENDUM REPORT: 06/26/2020 13:02 ADDENDUM: Pathology revealed GRADE 2/3 INVASIVE MAMMARY CARCINOMA, PERINEURAL INVASION of the RIGHT breast, 7 o'clock. This  was found to be concordant by Dr. Dorise Bullion. Pathology revealed MAMMARY CARCINOMA, CLINICALLY RIGHT AXILLARY LYMPH NODE. There is no residual lymph node tissue present. This was found to be concordant by Dr. Dorise Bullion. Pathology results were discussed with the patient by telephone. The patient reported doing well after the biopsies with tenderness at the sites. Post biopsy instructions and care were reviewed and questions were answered. The patient was encouraged to call [facility] for any additional concerns. The patient was referred to [facility] on Jul 02, 2020. Strongly recommend breast MRI for extent of disease. Pathology results reported by Stacie Acres RN on 06/26/2020. Electronically Signed   By: Dorise Bullion III M.D   On: 06/26/2020 13:02   Result Date: 06/29/2020 CLINICAL DATA:  Biopsy of a 7 o'clock right breast mass and an abnormal right axillary lymph node. EXAM: ULTRASOUND GUIDED RIGHT BREAST CORE NEEDLE BIOPSY COMPARISON:  Previous exam(s). PROCEDURE: I met with the patient and we discussed the procedure of ultrasound-guided biopsy, including benefits and alternatives. We discussed the high likelihood of a successful procedure. We discussed the risks of the procedure, including infection, bleeding, tissue injury, clip migration, and inadequate sampling. Informed written consent was given. The usual time-out protocol was performed immediately prior to the procedure. Lesion quadrant: 7 o'clock right breast Using sterile technique and 1% Lidocaine as local anesthetic, under direct ultrasound visualization, a 12 gauge spring-loaded device was used to perform biopsy of a 7 o'clock right breast mass using a lateral approach. At the conclusion of the procedure a coil shaped tissue marker clip was deployed into the biopsy cavity. Follow up 2 view mammogram was performed and dictated  separately. Lesion quadrant: Right axilla Using sterile technique and 1% Lidocaine as local anesthetic, under direct ultrasound visualization, a 14 gauge spring-loaded device was used to perform biopsy of an abnormal right axillary lymph node using a lateral approach. At the conclusion of the procedure a tribell-shaped tissue marker clip was deployed into the biopsy cavity. Follow up 2 view mammogram was performed and dictated separately. IMPRESSION: Ultrasound guided biopsy of a right 7 o'clock  breast mass and a right axillary lymph node. No apparent complications. Electronically Signed: By: Dorise Bullion III M.D On: 06/25/2020 10:04   MM CLIP PLACEMENT RIGHT  Result Date: 06/25/2020 CLINICAL DATA:  Evaluate biopsy marker EXAM: DIAGNOSTIC RIGHT MAMMOGRAM POST ULTRASOUND BIOPSY COMPARISON:  Previous exam(s). FINDINGS: Mammographic images were obtained following ultrasound guided biopsy of a right breast mass. The biopsy marking clip is in expected position at the site of biopsy. IMPRESSION: Appropriate positioning of the coil shaped biopsy marking clip at the site of biopsy in the biopsied right breast mass. The clip placed into an abnormal lymph node cannot be visualized mammographically. Final Assessment: Post Procedure Mammograms for Marker Placement Electronically Signed   By: Dorise Bullion III M.D   On: 06/25/2020 10:39   [place] RT BREAST BX W LOC DEV 1ST LESION IMG BX SPEC US GUIDE  Addendum Date: 06/29/2020   ADDENDUM REPORT: 06/26/2020 13:02 ADDENDUM: Pathology revealed GRADE 2/3 INVASIVE MAMMARY CARCINOMA, PERINEURAL INVASION of the RIGHT breast, 7 o'clock. This was found to be concordant by Dr. Dorise Bullion. Pathology revealed MAMMARY CARCINOMA, CLINICALLY RIGHT AXILLARY LYMPH NODE. There is no residual lymph node tissue present. This was found to be concordant by Dr. Dorise Bullion. Pathology results were discussed with the patient by telephone. The patient reported doing well after the biopsies  with tenderness at the sites. Post biopsy instructions and care were reviewed and questions were answered. The patient was encouraged to call [facility] for any additional concerns. The patient was referred to [facility] on Jul 02, 2020. Strongly recommend breast MRI for extent of disease. Pathology results reported by Stacie Acres RN on 06/26/2020. Electronically Signed   By: Dorise Bullion III M.D   On: 06/26/2020 13:02   Result Date: 06/29/2020 CLINICAL DATA:  Biopsy of a 7 o'clock right breast mass and an abnormal right axillary lymph node. EXAM: ULTRASOUND GUIDED RIGHT BREAST CORE NEEDLE BIOPSY COMPARISON:  Previous exam(s). PROCEDURE: I met with the patient and we discussed the procedure of ultrasound-guided biopsy, including benefits and alternatives. We discussed the high likelihood of a successful procedure. We discussed the risks of the procedure, including infection, bleeding, tissue injury, clip migration, and inadequate sampling. Informed written consent was given. The usual time-out protocol was performed immediately prior to the procedure. Lesion quadrant: 7 o'clock right breast Using sterile technique and 1% Lidocaine as local anesthetic, under direct ultrasound visualization, a 12 gauge spring-loaded device was used to perform biopsy of a 7 o'clock right breast mass using a lateral approach. At the conclusion of the procedure a coil shaped tissue marker clip was deployed into the biopsy cavity. Follow up 2 view mammogram was performed and dictated separately. Lesion quadrant: Right axilla Using sterile technique and 1% Lidocaine as local anesthetic, under direct ultrasound visualization, a 14 gauge spring-loaded device was used to perform biopsy of an abnormal right axillary lymph node using a lateral approach. At the conclusion of the procedure a tribell-shaped tissue marker clip was deployed  into the biopsy cavity. Follow up 2 view mammogram was performed and dictated separately. IMPRESSION: Ultrasound guided biopsy of a right 7 o'clock breast mass and a right axillary lymph node. No apparent complications. Electronically Signed: By: Dorise Bullion III M.D On: 06/25/2020 10:04      IMPRESSION: Stage IB (cT2, cN1)  Right Breast LOQ, Invasive Ductal Carcinoma, ER + / PR + / Her2 +, Grade  2   Patient will be a good candidate for breast conservation with radiotherapy to right breast. We discussed the general course of radiation, potential side effects, and toxicities with radiation and the patient is interested in this approach.    PLAN:  1. Genetics (patient reports having a couple of years ago) 2. Port (MRI) 3. Neoadjuvant Chemotherapy  4. Right lumpectomy with Targeted axillary node dissection  5. Adjuvant radiation therapy 6. Adjuvant hormonal therapy  ------------------------------------------------  Blair Promise, PhD, MD  This document serves as a record of services personally performed by Gery Pray, MD. It was created on his behalf by Roney Mans, a trained medical scribe. The creation of this record is based on the scribe's personal observations and the provider's statements to them. This document has been checked and approved by the attending provider.

## 2020-07-02 NOTE — Therapy (Signed)
[place], [place], [address] Phone: [phone]   Fax:  [phone]  Physical Therapy Evaluation  Patient Details  Name: Kim Archer MRN: [id number] Date of Birth: 1965-10-20 Referring Provider (PT): Dr. Autumn Messing   Encounter Date: 07/02/2020   PT End of Session - 07/02/20 1301    Visit Number 1    Number of Visits 2    Date for PT Re-Evaluation 01/02/21    PT Start Time 0908    PT Stop Time 0926   Also saw pt from 1023-1035 for a total of 30 min   PT Time Calculation (min) 18 min    Activity Tolerance Patient tolerated treatment well    Behavior During Therapy [facility] for tasks assessed/performed           Past Medical History:  Diagnosis Date  . Allergy   . Anemia    due to heavy periods  . Atypical ductal hyperplasia of left breast   . HSV-2 (herpes simplex virus 2) infection     Past Surgical History:  Procedure Laterality Date  . BREAST CYST EXCISION Left 03/22/2016   ALH-BENIGN  . BREAST LUMPECTOMY WITH RADIOACTIVE SEED LOCALIZATION Left 03/22/2016   Procedure: LEFT BREAST LUMPECTOMY WITH RADIOACTIVE SEED LOCALIZATION;  Surgeon: Autumn Messing III, MD;  Location: [facility];  Service: General;  Laterality: Left;  . CESAREAN SECTION     x3  . COMBINED HYSTEROSCOPY DIAGNOSTIC / D&C  07/17/2018  . EYE SURGERY     lt lens replacement  . TUBAL LIGATION      There were no vitals filed for this visit.    Subjective Assessment - 07/02/20 1244    Subjective Patient reports she is here today to be seen by her medical team for her newly diagnosed right breast cancer    Patient is accompained by: Family member    Pertinent History Patient was diagnosed 06/11/2020 with right grade III invasive ductal carcinoma breast cancer. It measures 2.4 cm and is located in the lower outer quadrant. It is triple positive wiht a Ki67 of 40%. She has a biopsied positive axillary lymph node.     Patient Stated Goals Reduce lymphedema risk and learn post op shoulder ROM HEP    Currently in Pain? Yes    Pain Score 5     Pain Location Arm    Pain Orientation Right;Anterior    Pain Descriptors / Indicators Sore;Tightness;Burning    Pain Type Acute pain    Pain Onset 1 to 4 weeks ago    Pain Frequency Intermittent    Aggravating Factors  reaching into abduction or flexion overhead    Pain Relieving Factors Keeping arm by side              [facility] PT Assessment - 07/02/20 0001      Assessment   Medical Diagnosis Right breast cancer    Referring Provider (PT) Dr. Autumn Messing    Onset Date/Surgical Date 06/11/20    Hand Dominance Right    Prior Therapy none      Precautions   Precautions Other (comment)    Precaution Comments active cancer      Restrictions   Weight Bearing Restrictions No      Balance Screen   Has the patient fallen in the past 6 months No    Has the patient had a decrease in activity level because of a fear of falling?  No  Is the patient reluctant to leave their home because of a fear of falling?  No      Home [employer] residence    Living Arrangements Spouse/significant other;Other relatives   Husband, daughter and her family including 3 kids and an 35 y.o. child she has custody of   Available Help at Discharge Family      Prior Function   Level of Independence Independent    Vocation Full time employment    Vocation Requirements Works in Administrator    Leisure She does not exercise      Cognition   Overall Cognitive Status Within Functional Limits for tasks assessed      Posture/Postural Control   Posture/Postural Control Postural limitations    Postural Limitations Rounded Shoulders;Forward head      ROM / Strength   AROM / PROM / Strength AROM;Strength      AROM   Overall AROM Comments Cervical AROM is WNL    AROM Assessment Site Shoulder    Right/Left Shoulder Right;Left    Right Shoulder  Extension 52 Degrees    Right Shoulder Flexion 148 Degrees    Right Shoulder ABduction 144 Degrees   Painful and limited by tightness   Right Shoulder Internal Rotation 64 Degrees    Right Shoulder External Rotation 90 Degrees    Left Shoulder Extension 49 Degrees    Left Shoulder Flexion 143 Degrees    Left Shoulder ABduction 158 Degrees    Left Shoulder Internal Rotation 65 Degrees    Left Shoulder External Rotation 80 Degrees      Strength   Overall Strength Within functional limits for tasks performed             LYMPHEDEMA/ONCOLOGY QUESTIONNAIRE - 07/02/20 0001      Type   Cancer Type Right breast cancer      Lymphedema Assessments   Lymphedema Assessments Upper extremities      Right Upper Extremity Lymphedema   10 cm Proximal to Olecranon Process 29.5 cm    Olecranon Process 25.5 cm    10 cm Proximal to Ulnar Styloid Process 23.5 cm    Just Proximal to Ulnar Styloid Process 16.4 cm    Across Hand at [employer] 20 cm    At [place] of 2nd Digit 6.5 cm      Left Upper Extremity Lymphedema   10 cm Proximal to Olecranon Process 29.6 cm    Olecranon Process 25.4 cm    10 cm Proximal to Ulnar Styloid Process 22.3 cm    Just Proximal to Ulnar Styloid Process 15.2 cm    Across Hand at [employer] 19.5 cm    At [place] of 2nd Digit 6.5 cm           L-DEX FLOWSHEETS - 07/02/20 1200      L-DEX LYMPHEDEMA SCREENING   Measurement Type Unilateral    L-DEX MEASUREMENT EXTREMITY Upper Extremity    POSITION  Standing    DOMINANT SIDE Right    At Risk Side Right    BASELINE SCORE (UNILATERAL) 0.8           The patient was assessed using the L-Dex machine today to produce a lymphedema index baseline score. The patient will be reassessed on a regular basis (typically every 3 months) to obtain new L-Dex scores. If the score is > 6.5 points away from his/her baseline score indicating onset of subclinical lymphedema, it will be recommended  to wear a compression garment  for 4 weeks, 12 hours per day and then be reassessed. If the score continues to be > 6.5 points from baseline at reassessment, we will initiate lymphedema treatment. Assessing in this manner has a 95% rate of preventing clinically significant lymphedema.      Katina Dung - 07/02/20 0001    Open a tight or new jar Moderate difficulty    Do heavy household chores (wash walls, wash floors) Moderate difficulty    Carry a shopping bag or briefcase Mild difficulty    Wash your back No difficulty    Use a knife to cut food No difficulty    Recreational activities in which you take some force or impact through your arm, shoulder, or hand (golf, hammering, tennis) Moderate difficulty    During the past week, to what extent has your arm, shoulder or hand problem interfered with your normal social activities with family, friends, neighbors, or groups? Quite a bit    During the past week, to what extent has your arm, shoulder or hand problem limited your work or other regular daily activities Slightly    Arm, shoulder, or hand pain. Moderate    Tingling (pins and needles) in your arm, shoulder, or hand Mild    Difficulty Sleeping Mild difficulty    DASH Score 34.09 %            Objective measurements completed on examination: See above findings.           Patient was instructed today in a home exercise program today for post op shoulder range of motion. These included active assist shoulder flexion in sitting, scapular retraction, wall walking with shoulder abduction, and hands behind head external rotation.  She was encouraged to do these twice a day, holding 3 seconds and repeating 5 times when permitted by her physician.        PT Education - 07/02/20 1300    Education Details Lymphedema risk reduction and post op shoulder ROM HEP    Person(s) Educated Patient;Other (comment)   sister   Methods Explanation;Demonstration;Handout    Comprehension Returned demonstration;Verbalized  understanding               PT Long Term Goals - 07/02/20 1316      PT LONG TERM GOAL #1   Title Patient will demonstrate she has regained full shoulder ROM and function post operatively compared to baselines.    Time 6    Period Months    Status New    Target Date 01/02/21           Breast Clinic Goals - 07/02/20 1316      Patient will be able to verbalize understanding of pertinent lymphedema risk reduction practices relevant to her diagnosis specifically related to skin care.   Time 1    Period Days    Status Achieved      Patient will be able to return demonstrate and/or verbalize understanding of the post-op home exercise program related to regaining shoulder range of motion.   Time 1    Period Days    Status Achieved      Patient will be able to verbalize understanding of the importance of attending the postoperative After Breast Cancer Class for further lymphedema risk reduction education and therapeutic exercise.   Time 1    Period Days    Status Achieved  Plan - 07/02/20 1301    Clinical Impression Statement Patient was diagnosed 06/11/2020 with right grade III invasive ductal carcinoma breast cancer. It measures 2.4 cm and is located in the lower outer quadrant. It is triple positive wiht a Ki67 of 40%. She has a biopsied positive axillary lymph node. Her multidisciplinary medical team met prior to her assessmemts to determine a recommended treatment plan. She is planning to have neoadjuvant chemotherapy followed by a right lumpectomy and targeted axillary node dissection, radiation, and anti-estrogen therapy. She will benefit from a post op PT reassessment to determine needs and from L-Dex screens every 3 months for 2 years to detect subclinical lymphedema.    Stability/Clinical Decision Making Stable/Uncomplicated    Clinical Decision Making Low    Rehab Potential Excellent    PT Frequency --   Eval and 1 f/u visit   PT  Treatment/Interventions ADLs/Self Care Home Management;Therapeutic exercise;Patient/family education    PT Next Visit Plan Will reassess 3-4 weeks post op    PT Home Exercise Plan Post op shoulder ROM HEP    Consulted and Agree with Plan of Care Patient;Family member/caregiver    Family Member Consulted sister           Patient will benefit from skilled therapeutic intervention in order to improve the following deficits and impairments:  Postural dysfunction,Decreased range of motion,Decreased knowledge of precautions,Impaired UE functional use,Pain  Visit Diagnosis: Malignant neoplasm of lower-outer quadrant of right breast of female, estrogen receptor positive ([place]) - Plan: PT plan of care cert/re-cert  Abnormal posture - Plan: PT plan of care cert/re-cert  Pain in right arm - Plan: PT plan of care cert/re-cert   Patient will follow up at outpatient cancer rehab 3-4 weeks following surgery.  If the patient requires physical therapy at that time, a specific plan will be dictated and sent to the referring physician for approval. The patient was educated today on appropriate basic range of motion exercises to begin post operatively and the importance of attending the After Breast Cancer class following surgery.  Patient was educated today on lymphedema risk reduction practices as it pertains to recommendations that will benefit the patient immediately following surgery.  She verbalized good understanding.      Problem List Patient Active Problem List   Diagnosis Date Noted  . Malignant neoplasm of lower-outer quadrant of right breast of female, estrogen receptor positive ([place]) 06/27/2020  . Genetic testing 06/06/2019  . 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

## 2020-07-02 NOTE — Progress Notes (Signed)
[place] Work  Initial Assessment   Kim Archer is a 55 y.o. year old female accompanied by patient and sister. Clinical Social Work was referred by [facility] for assessment of psychosocial needs.   SDOH (Social Determinants of Health) assessments performed: Yes SDOH Interventions   Flowsheet Row Most Recent Value  SDOH Interventions   Food Insecurity Interventions Intervention Not Indicated  Financial Strain Interventions Intervention Not Indicated  Transportation Interventions Intervention Not Indicated      Distress Screen completed: Yes ONCBCN DISTRESS SCREENING 07/02/2020  Screening Type Initial Screening  Distress experienced in past week (1-10) 3  Emotional problem type Nervousness/Anxiety;Adjusting to illness  Spiritual/Religous concerns type Facing my mortality  Information Concerns Type Lack of info about diagnosis;Lack of info about treatment  Physical Problem type Pain;Swollen arms/legs    Family/Social Information:  . Housing Arrangement: patient lives with husband, Kim Archer. 3 adult children live outside of the home . Family members/support persons in your life? Family- husband, sister, niece . Transportation concerns: no  . Employment: Working full time for [employer] (has benefits). Income source: Employment and husband's employment . Financial concerns: No o Type of concern: None . Food access concerns: no . Services Currently in place:  n/a  Coping/ Adjustment to diagnosis: . Patient understands treatment plan and what happens next? yes, feeling "numb" but understands plans from team today . Concerns about diagnosis and/or treatment: Overwhelmed by information . Patient reported stressors: Anxiety, Adjusting to my illness and Facing my mortality . Patient enjoys camping . Current coping skills/ strengths: Capable of independent living and Supportive family/friends    SUMMARY: Current SDOH Barriers:  . No significant SDOH barriers noted  today   Interventions: . Discussed common feeling and emotions when being diagnosed with cancer, and the importance of support during treatment . Informed patient of the support team roles and support services at [facility] . Provided CSW contact information and encouraged patient to call with any questions or concerns   Follow Up Plan: Patient will contact CSW with any support or resource needs Patient verbalizes understanding of plan: Yes   Kim Archer , LCSW

## 2020-07-03 ENCOUNTER — Other Ambulatory Visit: Payer: Self-pay

## 2020-07-03 ENCOUNTER — Ambulatory Visit (HOSPITAL_COMMUNITY)
Admission: RE | Admit: 2020-07-03 | Discharge: 2020-07-03 | Disposition: A | Discharge status: Home / Self Care | Payer: 59 | Source: Ambulatory Visit | Attending: Oncology | Admitting: Oncology

## 2020-07-03 ENCOUNTER — Encounter (HOSPITAL_COMMUNITY): Payer: Self-pay | Admitting: General Surgery

## 2020-07-03 DIAGNOSIS — Z0189 Encounter for other specified special examinations: Secondary | ICD-10-CM | POA: Diagnosis not present

## 2020-07-03 DIAGNOSIS — C50511 Malignant neoplasm of lower-outer quadrant of right female breast: Secondary | ICD-10-CM | POA: Insufficient documentation

## 2020-07-03 DIAGNOSIS — Z170 Estrogen receptor positive status [ER+]: Secondary | ICD-10-CM | POA: Insufficient documentation

## 2020-07-03 DIAGNOSIS — I517 Cardiomegaly: Secondary | ICD-10-CM | POA: Diagnosis not present

## 2020-07-03 LAB — ECHOCARDIOGRAM COMPLETE
Area-P 1/2: 4.31 cm2
S' Lateral: 3 cm

## 2020-07-03 NOTE — Progress Notes (Signed)
Spoke with pt for pre-op call. Pt denies cardiac history, HTN or Diabetes.  Covid test not done, pt is scheduled as ambulatory. Pt denies any Covid symptoms.

## 2020-07-03 NOTE — Progress Notes (Signed)
  Echocardiogram 2D Echocardiogram has been performed.  Kim Archer 07/03/2020, 8:45 AM

## 2020-07-05 NOTE — Progress Notes (Signed)
Kim Archer  [phone] [phone]     ID: Kim Archer DOB: 15-Apr-1965  MR#: [id number]  JME#:[id number]  Patient Care Team: Kim Queen, MD as PCP - General (Obstetrics and Gynecology) Archer, Kim Dad, MD as Consulting Physician (Oncology) Kim Kussmaul, MD as Consulting Physician (General Surgery) Kim Queen, MD as Consulting Physician (Obstetrics and Gynecology) Kim Kaufmann, RN as Oncology Nurse Navigator Kim Germany, RN as Oncology Nurse Navigator Kim Kussmaul, MD as Consulting Physician (General Surgery) Archer, Kim Dad, MD as Consulting Physician (Oncology) Kim Pray, MD as Consulting Physician (Radiation Oncology) Kim Archer OTHER MD:  CHIEF COMPLAINT: Triple positive breast cancer  CURRENT TREATMENT: Neoadjuvant chemotherapy.   INTERVAL HISTORY: Kim Archer returns today for follow up of her triple positive breast cancer. She was evaluated in the multidisciplinary breast cancer clinic on 07/02/2020.  Since her last visit, she underwent echocardiogram on 07/03/2020 showing and ejection fraction of 55-60%.  She underwent breast MRI earlier today.  She also underwent port placement and chemo education earlier today in anticipation of beginning neoadjuvant chemotherapy on 07/24/2020.   REVIEW OF SYSTEMS: Kim Archer met with our chemotherapy teaching nurse today together with her husband Kim Archer.  She is understandably anxious and they had many questions which is the reason that I briefly saw them today   COVID 19 VACCINATION STATUS: not vaccinated; infection 10/2019   HISTORY OF CURRENT ILLNESS: From the original intake note:  Kim Archer "Kim Archer" [facility]) was previously seen in the high risk breast cancer clinic on 05/11/2016. She had undergone left lumpectomy on 03/22/2016 showing atypical lobular hyperplasia. She was prescribed tamoxifen at that time, which she believes she took for two years.  [She underwent hysterectomy with bilateral salpingo-oophorectomy 03/28/2019, pathology showing leiomyomas and adenomyosis, and may have stopped the tamoxifen around that time].  More recently, she had routine screening mammography on 06/11/2020 showing an area of asymmetry in the right breast. She underwent right diagnostic mammography with tomography and right breast ultrasonography at [facility] on 06/20/2020 showing: breast density category C; 2.4 cm right breast mass at 7 o'clock; one abnormal right axillary lymph node measuring 1.5 cm.  Accordingly on 06/25/2020 she proceeded to biopsy of the right breast area in question. The pathology from this procedure ([id number]) showed: invasive mammary carcinoma, e-cadherin positive, grade 2 or 3. Prognostic indicators significant for: estrogen receptor, 40% positive and progesterone receptor, >95% positive, both with strong staining intensity. Proliferation marker Ki67 at 40%. HER2 positive by immunohistochemistry (3+).  Biopsy of the right axillary lymph node performed at the same time showed mammary carcinoma, with no residual lymph node tissue present.  Cancer Staging Malignant neoplasm of lower-outer quadrant of right breast of female, estrogen receptor positive ([place]) Staging form: Breast, AJCC 8th Edition - Clinical stage from 07/02/2020: Stage IB (cT2, cN1, cM0, G3, ER+, PR+, HER2+) - Signed by Kim Cruel, MD on 07/02/2020 Stage prefix: Initial diagnosis Histologic grading system: 3 grade system Laterality: Right Staged by: Pathologist and managing physician Stage used in treatment planning: Yes National guidelines used in treatment planning: Yes Type of national guideline used in treatment planning: NCCN  The patient's subsequent history is as detailed below.   PAST MEDICAL HISTORY: Past Medical History:  Diagnosis Date  . Allergy   . Anemia    due to heavy periods  . Atypical ductal hyperplasia of left breast   . Cancer  ([place]) 2022   Breast cancer  . History of kidney  stones   . HSV-2 (herpes simplex virus 2) infection   . PONV (postoperative nausea and vomiting)     PAST SURGICAL HISTORY: Past Surgical History:  Procedure Laterality Date  . BREAST CYST EXCISION Left 03/22/2016   ALH-BENIGN  . BREAST LUMPECTOMY WITH RADIOACTIVE SEED LOCALIZATION Left 03/22/2016   Procedure: LEFT BREAST LUMPECTOMY WITH RADIOACTIVE SEED LOCALIZATION;  Surgeon: Autumn Messing III, MD;  Location: [place];  Service: General;  Laterality: Left;  . CESAREAN SECTION     x3  . COMBINED HYSTEROSCOPY DIAGNOSTIC / D&C  07/17/2018  . EYE SURGERY     lt lens replacement  . TUBAL LIGATION      FAMILY HISTORY: Family History  Problem Relation Age of Onset  . Breast cancer Mother   . Melanoma Father   . Leukemia Sister   . Liver cancer Brother        stomach  . Breast cancer Maternal Aunt   . Colon cancer Neg Hx   . Colon polyps Neg Hx   . Esophageal cancer Neg Hx   . Rectal cancer Neg Hx   . Stomach cancer Neg Hx   The patient's father died at age 79. He had a history of melanoma. He has a brother, the patient's uncle, with prostate cancer. The patient's mother died in her 79s from urosepsis. She had a history of breast cancer diagnosed in her 17s. The patient has a brother, with cerebral palsy, who had "stomach cancer" metastatic to the liver. The patient has 2 sisters. One of them died from childhood leukemia.    GYNECOLOGIC HISTORY:  No LMP recorded (lmp unknown). Patient has had a hysterectomy. Menarche: 55 years old Age at first live birth: 55 years old [place] P 3 LMP 03/2019, with hysterectomy Contraceptive: used remotely for many years with no complications HRT never used  Hysterectomy? Yes, 03/2019 (path in Epic) BSO? 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 Her husband Kim Archer (goes by "Kim Archer") is Ambulance person for [employer], which manages apartment complexes. The patient's daughter Kim Archer, age 5, works as a Theme park manager in [place]. She has 3 children. The patient's daughter Kim Archer, age 1, lives in [place]. She, her husband, and their three children live with [place] and [place]. The patient's third child, Kim Archer "Dalbert Mayotte, 59 years old, also lives in [place] and works in Architect. Patient has in total 8 grandchildren. She is a Psychologist, forensic.    ADVANCED DIRECTIVES: In the absence of any documentation to the contrary, the patient's spouse is their HCPOA.    HEALTH MAINTENANCE: Social History   Tobacco Use  . Smoking status: Never Smoker  . Smokeless tobacco: Never Used  Vaping Use  . Vaping Use: Never used  Substance Use Topics  . Alcohol use: No  . Drug use: No     Colonoscopy: 08/2018 (Dr. Ardis Hughs), recall 2030  PAP: 05/2020  Bone density: 05/2019 (Dr. Christen Butter office)   Allergies  Allergen Reactions  . Ciprofloxacin Other (See Comments)    Patient came into office with complaints of difficulty swallowing.  Ate at [employer] prior and took Cipro prior  . Sulfa Antibiotics Rash    Current Outpatient Medications  Medication Sig Dispense Refill  . Boron 3 MG CAPS Take by mouth.    . Calcium 500 MG tablet 1,000 mg.    . dexamethasone (DECADRON) 4 MG tablet Take 2 tablets (  8 mg total) by mouth 2 (two) times daily. Start the day before Taxotere. Then take daily x 3 days after chemotherapy. 30 tablet 1  . Diethylpropion HCl CR 75 MG TB24 diethylpropion ER 75 mg tablet,extended release  TAKE 1 TABLET BY MOUTH EVERY DAY    . lidocaine-prilocaine (EMLA) cream Apply to affected area once 30 g 3  . loratadine (CLARITIN) 10 MG tablet Take 10 mg by mouth daily.    . LUTEIN PO Take by mouth.    Marland Kitchen MAGNESIUM PO Take 1 tablet by mouth at bedtime.    . Menatetrenone (VITAMIN K2) 100 MCG TABS Take by mouth.    . Multiple Vitamin (MULTIVITAMIN WITH MINERALS)  TABS tablet Take 1 tablet by mouth daily.    . prochlorperazine (COMPAZINE) 10 MG tablet Take 1 tablet (10 mg total) by mouth every 6 (six) hours as needed (Nausea or vomiting). 30 tablet 1  . Ubiquinol 50 MG CAPS Take by mouth daily.    . valACYclovir (VALTREX) 500 MG tablet   12  . VITAMIN D PO Vitamin D    . zinc sulfate 220 (50 Zn) MG capsule Take 220 mg by mouth daily.     No current facility-administered medications for this visit.    OBJECTIVE: White woman who appears stated age  There were no vitals filed for this visit.   There is no height or weight on file to calculate BMI.   Wt Readings from Last 3 Encounters:  07/02/20 173 lb 3.2 oz (78.6 kg)  08/29/18 175 lb (79.4 kg)  08/07/18 175 lb (79.4 kg)      ECOG FS:1 - Symptomatic but completely ambulatory   LAB RESULTS:  CMP     Component Value Date/Time   NA 146 (H) 07/02/2020 0826   NA 142 05/11/2016 1508   K 3.8 07/02/2020 0826   K 4.3 05/11/2016 1508   CL 107 07/02/2020 0826   CO2 29 07/02/2020 0826   CO2 26 05/11/2016 1508   GLUCOSE 74 07/02/2020 0826   GLUCOSE 90 05/11/2016 1508   BUN 14 07/02/2020 0826   BUN 10.9 05/11/2016 1508   CREATININE 0.87 07/02/2020 0826   CREATININE 1.0 05/11/2016 1508   CALCIUM 9.6 07/02/2020 0826   CALCIUM 9.5 05/11/2016 1508   PROT 7.5 07/02/2020 0826   PROT 7.0 05/11/2016 1508   ALBUMIN 4.0 07/02/2020 0826   ALBUMIN 3.9 05/11/2016 1508   AST 22 07/02/2020 0826   AST 19 05/11/2016 1508   ALT 22 07/02/2020 0826   ALT 23 05/11/2016 1508   ALKPHOS 112 07/02/2020 0826   ALKPHOS 98 05/11/2016 1508   BILITOT 0.5 07/02/2020 0826   BILITOT 0.36 05/11/2016 1508   GFRNONAA >60 07/02/2020 0826    No results found for: TOTALPROTELP, ALBUMINELP, A1GS, A2GS, BETS, BETA2SER, GAMS, MSPIKE, SPEI  Lab Results  Component Value Date   WBC 4.7 07/02/2020   NEUTROABS 2.4 07/02/2020   HGB 14.6 07/02/2020   HCT 44.9 07/02/2020   MCV 94.5 07/02/2020   PLT 239 07/02/2020    No  results found for: LABCA2  No components found for: QGBEEF007  No results for input(s): INR in the last 168 hours.  No results found for: LABCA2  No results found for: HQR975  No results found for: OIT254  No results found for: DIY641  No results found for: CA2729  No components found for: HGQUANT  No results found for: CEA1 / No results found for: CEA1   No results found  for: AFPTUMOR  No results found for: CHROMOGRNA  No results found for: KPAFRELGTCHN, LAMBDASER, KAPLAMBRATIO (kappa/lambda light chains)  No results found for: HGBA, HGBA2QUANT, HGBFQUANT, HGBSQUAN (Hemoglobinopathy evaluation)   No results found for: LDH  No results found for: IRON, TIBC, IRONPCTSAT (Iron and TIBC)  No results found for: FERRITIN  Urinalysis No results found for: COLORURINE, APPEARANCEUR, LABSPEC, PHURINE, GLUCOSEU, HGBUR, BILIRUBINUR, KETONESUR, PROTEINUR, UROBILINOGEN, NITRITE, LEUKOCYTESUR   STUDIES: US BREAST LTD UNI RIGHT INC AXILLA  Result Date: 06/20/2020 CLINICAL DATA:  55 year old female presenting as a recall from screening for possible right breast asymmetry. EXAM: DIGITAL DIAGNOSTIC UNILATERAL RIGHT MAMMOGRAM WITH TOMOSYNTHESIS AND CAD; ULTRASOUND RIGHT BREAST LIMITED TECHNIQUE: Right digital diagnostic mammography and breast tomosynthesis was performed. The images were evaluated with computer-aided detection.; Targeted ultrasound examination of the right breast was performed COMPARISON:  Previous exam(s). ACR Breast Density Category c: The breast tissue is heterogeneously dense, which may obscure small masses. FINDINGS: Mammogram: Spot compression tomosynthesis cc and MLO as well as full field mL tomosynthesis views of the right breast were performed. There is persistence of an asymmetry with distortion in the central inferior right breast posterior depth measuring approximately 1.5 cm. There are no additional new findings elsewhere in the right breast. On physical exam in  the inferior slightly outer right breast I palpate a focal thickening. Ultrasound: Targeted ultrasound performed in the right breast at 7 o'clock 3 cm from nipple demonstrating an irregular hypoechoic mass measuring 2.0 x 1.3 x 2.4 cm. This corresponds to the mammographic finding. Targeted ultrasound the right axilla demonstrates an abnormally rounded lymph node with lack of fatty hilum measuring 1.5 cm. IMPRESSION: 1. Suspicious mass in the right breast at 7 o'clock measuring 2.4 cm. 2.  Abnormal right axillary lymph node. RECOMMENDATION: Ultrasound-guided core needle biopsy x2 of the right breast mass at 7 o'clock and of the right axillary lymph node. I have discussed the findings and recommendations with the patient who agrees to proceed with biopsy. The patient will be scheduled for the biopsy appointment prior to leaving the office today. BI-RADS CATEGORY  5: Highly suggestive of malignancy. Electronically Signed   By: Audie Pinto M.D.   On: 06/20/2020 11:02   MM DIAG BREAST TOMO UNI RIGHT  Result Date: 06/20/2020 CLINICAL DATA:  55 year old female presenting as a recall from screening for possible right breast asymmetry. EXAM: DIGITAL DIAGNOSTIC UNILATERAL RIGHT MAMMOGRAM WITH TOMOSYNTHESIS AND CAD; ULTRASOUND RIGHT BREAST LIMITED TECHNIQUE: Right digital diagnostic mammography and breast tomosynthesis was performed. The images were evaluated with computer-aided detection.; Targeted ultrasound examination of the right breast was performed COMPARISON:  Previous exam(s). ACR Breast Density Category c: The breast tissue is heterogeneously dense, which may obscure small masses. FINDINGS: Mammogram: Spot compression tomosynthesis cc and MLO as well as full field mL tomosynthesis views of the right breast were performed. There is persistence of an asymmetry with distortion in the central inferior right breast posterior depth measuring approximately 1.5 cm. There are no additional new findings elsewhere in  the right breast. On physical exam in the inferior slightly outer right breast I palpate a focal thickening. Ultrasound: Targeted ultrasound performed in the right breast at 7 o'clock 3 cm from nipple demonstrating an irregular hypoechoic mass measuring 2.0 x 1.3 x 2.4 cm. This corresponds to the mammographic finding. Targeted ultrasound the right axilla demonstrates an abnormally rounded lymph node with lack of fatty hilum measuring 1.5 cm. IMPRESSION: 1. Suspicious mass in the right breast at 7 o'clock measuring 2.4  cm. 2.  Abnormal right axillary lymph node. RECOMMENDATION: Ultrasound-guided core needle biopsy x2 of the right breast mass at 7 o'clock and of the right axillary lymph node. I have discussed the findings and recommendations with the patient who agrees to proceed with biopsy. The patient will be scheduled for the biopsy appointment prior to leaving the office today. BI-RADS CATEGORY  5: Highly suggestive of malignancy. Electronically Signed   By: Audie Pinto M.D.   On: 06/20/2020 11:02   ECHOCARDIOGRAM COMPLETE  Result Date: 07/03/2020    ECHOCARDIOGRAM REPORT   Patient Name:   GEORGIAN MCCLORY Date of Exam: 07/03/2020 Medical Rec #:  [id number]         Height:       67.0 in Accession #:    [id number]        Weight:       173.2 lb Date of Birth:  07/04/1965         BSA:          1.902 m Patient Age:    55 years          BP:           129/75 mmHg Patient Gender: F                 HR:           62 bpm. Exam Location:  Outpatient Procedure: 2D Echo, Cardiac Doppler and Color Doppler Indications:    Chemo evaluation  History:        Patient has no prior history of Echocardiogram examinations.                 Breast cancer.  Sonographer:    Dustin Flock Referring Phys: [place]  1. Left ventricular ejection fraction, by estimation, is 55 to 60%. The left ventricle has normal function. The left ventricle has no regional wall motion abnormalities. There is moderate  concentric left ventricular hypertrophy. Left ventricular diastolic parameters are indeterminate. The average left ventricular global longitudinal strain is -16.8 %. The global longitudinal strain is normal.  2. Right ventricular systolic function is normal. The right ventricular size is normal. There is normal pulmonary artery systolic pressure.  3. The mitral valve is normal in structure. No evidence of mitral valve regurgitation. No evidence of mitral stenosis.  4. The aortic valve is normal in structure. Aortic valve regurgitation is not visualized. No aortic stenosis is present.  5. The inferior vena cava is normal in size with greater than 50% respiratory variability, suggesting right atrial pressure of 3 mmHg. FINDINGS  Left Ventricle: Left ventricular ejection fraction, by estimation, is 55 to 60%. The left ventricle has normal function. The left ventricle has no regional wall motion abnormalities. The average left ventricular global longitudinal strain is -16.8 %. The global longitudinal strain is normal. The left ventricular internal cavity size was normal in size. There is moderate concentric left ventricular hypertrophy. Left ventricular diastolic parameters are indeterminate. Normal left ventricular filling pressure. Right Ventricle: The right ventricular size is normal. No increase in right ventricular wall thickness. Right ventricular systolic function is normal. There is normal pulmonary artery systolic pressure. The tricuspid regurgitant velocity is 2.55 m/s, and  with an assumed right atrial pressure of 3 mmHg, the estimated right ventricular systolic pressure is 30.0 mmHg. Left Atrium: Left atrial size was normal in size. Right Atrium: Right atrial size was normal in size. Pericardium: There is no evidence of pericardial effusion.  Mitral Valve: The mitral valve is normal in structure. No evidence of mitral valve regurgitation. No evidence of mitral valve stenosis. Tricuspid Valve: The tricuspid  valve is normal in structure. Tricuspid valve regurgitation is not demonstrated. No evidence of tricuspid stenosis. Aortic Valve: The aortic valve is normal in structure. Aortic valve regurgitation is not visualized. No aortic stenosis is present. Pulmonic Valve: The pulmonic valve was normal in structure. Pulmonic valve regurgitation is not visualized. No evidence of pulmonic stenosis. Aorta: The aortic root is normal in size and structure. Venous: The inferior vena cava is normal in size with greater than 50% respiratory variability, suggesting right atrial pressure of 3 mmHg. IAS/Shunts: No atrial level shunt detected by color flow Doppler.  LEFT VENTRICLE PLAX 2D LVIDd:         3.70 cm  Diastology LVIDs:         3.00 cm  LV e' medial:    7.40 cm/s LV PW:         1.30 cm  LV E/e' medial:  10.6 LV IVS:        1.30 cm  LV e' lateral:   10.30 cm/s LVOT diam:     1.90 cm  LV E/e' lateral: 7.6 LV SV:         60 LV SV Index:   31       2D Longitudinal Strain LVOT Area:     2.84 cm 2D Strain GLS (A2C):   -17.4 %                         2D Strain GLS (A3C):   -17.2 %                         2D Strain GLS (A4C):   -15.6 %                         2D Strain GLS Avg:     -16.8 % RIGHT VENTRICLE RV Basal diam:  3.30 cm RV S prime:     11.20 cm/s TAPSE (M-mode): 2.8 cm LEFT ATRIUM           Index       RIGHT ATRIUM           Index LA diam:      4.00 cm 2.10 cm/m  RA Area:     15.80 cm LA Vol (A2C): 31.5 ml 16.56 ml/m RA Volume:   43.80 ml  23.02 ml/m LA Vol (A4C): 57.9 ml 30.44 ml/m  AORTIC VALVE LVOT Vmax:   90.20 cm/s LVOT Vmean:  62.400 cm/s LVOT VTI:    0.210 m  AORTA Ao Root diam: 2.70 cm MITRAL VALVE               TRICUSPID VALVE MV Area (PHT): 4.31 cm    TR Peak grad:   26.0 mmHg MV Decel Time: 176 msec    TR Vmax:        255.00 cm/s MV E velocity: 78.60 cm/s MV A velocity: 68.90 cm/s  SHUNTS MV E/A ratio:  1.14        Systemic VTI:  0.21 m                            Systemic Diam: 1.90 cm Fransico Him MD  Electronically signed by Fransico Him MD Signature Date/Time:  07/03/2020/1:12:54 PM    Final    [place] AXILLARY NODE CORE BIOPSY RIGHT  Addendum Date: 06/29/2020   ADDENDUM REPORT: 06/26/2020 13:02 ADDENDUM: Pathology revealed GRADE 2/3 INVASIVE MAMMARY CARCINOMA, PERINEURAL INVASION of the RIGHT breast, 7 o'clock. This was found to be concordant by Dr. Dorise Bullion. Pathology revealed MAMMARY CARCINOMA, CLINICALLY RIGHT AXILLARY LYMPH NODE. There is no residual lymph node tissue present. This was found to be concordant by Dr. Dorise Bullion. Pathology results were discussed with the patient by telephone. The patient reported doing well after the biopsies with tenderness at the sites. Post biopsy instructions and care were reviewed and questions were answered. The patient was encouraged to call [facility] for any additional concerns. The patient was referred to [facility] on Jul 02, 2020. Strongly recommend breast MRI for extent of disease. Pathology results reported by Stacie Acres RN on 06/26/2020. Electronically Signed   By: Dorise Bullion III M.D   On: 06/26/2020 13:02   Result Date: 06/29/2020 CLINICAL DATA:  Biopsy of a 7 o'clock right breast mass and an abnormal right axillary lymph node. EXAM: ULTRASOUND GUIDED RIGHT BREAST CORE NEEDLE BIOPSY COMPARISON:  Previous exam(s). PROCEDURE: I met with the patient and we discussed the procedure of ultrasound-guided biopsy, including benefits and alternatives. We discussed the high likelihood of a successful procedure. We discussed the risks of the procedure, including infection, bleeding, tissue injury, clip migration, and inadequate sampling. Informed written consent was given. The usual time-out protocol was performed immediately prior to the procedure. Lesion quadrant: 7 o'clock right breast Using sterile technique and 1% Lidocaine as local anesthetic,  under direct ultrasound visualization, a 12 gauge spring-loaded device was used to perform biopsy of a 7 o'clock right breast mass using a lateral approach. At the conclusion of the procedure a coil shaped tissue marker clip was deployed into the biopsy cavity. Follow up 2 view mammogram was performed and dictated separately. Lesion quadrant: Right axilla Using sterile technique and 1% Lidocaine as local anesthetic, under direct ultrasound visualization, a 14 gauge spring-loaded device was used to perform biopsy of an abnormal right axillary lymph node using a lateral approach. At the conclusion of the procedure a tribell-shaped tissue marker clip was deployed into the biopsy cavity. Follow up 2 view mammogram was performed and dictated separately. IMPRESSION: Ultrasound guided biopsy of a right 7 o'clock breast mass and a right axillary lymph node. No apparent complications. Electronically Signed: By: Dorise Bullion III M.D On: 06/25/2020 10:04   MM CLIP PLACEMENT RIGHT  Result Date: 06/25/2020 CLINICAL DATA:  Evaluate biopsy marker EXAM: DIAGNOSTIC RIGHT MAMMOGRAM POST ULTRASOUND BIOPSY COMPARISON:  Previous exam(s). FINDINGS: Mammographic images were obtained following ultrasound guided biopsy of a right breast mass. The biopsy marking clip is in expected position at the site of biopsy. IMPRESSION: Appropriate positioning of the coil shaped biopsy marking clip at the site of biopsy in the biopsied right breast mass. The clip placed into an abnormal lymph node cannot be visualized mammographically. Final Assessment: Post Procedure Mammograms for Marker Placement Electronically Signed   By: Dorise Bullion III M.D   On: 06/25/2020 10:39   [place] RT BREAST BX W LOC DEV 1ST LESION IMG BX SPEC US GUIDE  Addendum Date: 06/29/2020   ADDENDUM REPORT: 06/26/2020 13:02 ADDENDUM: Pathology revealed GRADE 2/3 INVASIVE MAMMARY CARCINOMA, PERINEURAL INVASION of the RIGHT breast, 7 o'clock. This was found to be concordant  by  Dr. Dorise Bullion. Pathology revealed MAMMARY CARCINOMA, CLINICALLY RIGHT AXILLARY LYMPH NODE. There is no residual lymph node tissue present. This was found to be concordant by Dr. Dorise Bullion. Pathology results were discussed with the patient by telephone. The patient reported doing well after the biopsies with tenderness at the sites. Post biopsy instructions and care were reviewed and questions were answered. The patient was encouraged to call [facility] for any additional concerns. The patient was referred to [facility] on Jul 02, 2020. Strongly recommend breast MRI for extent of disease. Pathology results reported by Stacie Acres RN on 06/26/2020. Electronically Signed   By: Dorise Bullion III M.D   On: 06/26/2020 13:02   Result Date: 06/29/2020 CLINICAL DATA:  Biopsy of a 7 o'clock right breast mass and an abnormal right axillary lymph node. EXAM: ULTRASOUND GUIDED RIGHT BREAST CORE NEEDLE BIOPSY COMPARISON:  Previous exam(s). PROCEDURE: I met with the patient and we discussed the procedure of ultrasound-guided biopsy, including benefits and alternatives. We discussed the high likelihood of a successful procedure. We discussed the risks of the procedure, including infection, bleeding, tissue injury, clip migration, and inadequate sampling. Informed written consent was given. The usual time-out protocol was performed immediately prior to the procedure. Lesion quadrant: 7 o'clock right breast Using sterile technique and 1% Lidocaine as local anesthetic, under direct ultrasound visualization, a 12 gauge spring-loaded device was used to perform biopsy of a 7 o'clock right breast mass using a lateral approach. At the conclusion of the procedure a coil shaped tissue marker clip was deployed into the biopsy cavity. Follow up 2 view mammogram was performed and dictated separately. Lesion quadrant: Right  axilla Using sterile technique and 1% Lidocaine as local anesthetic, under direct ultrasound visualization, a 14 gauge spring-loaded device was used to perform biopsy of an abnormal right axillary lymph node using a lateral approach. At the conclusion of the procedure a tribell-shaped tissue marker clip was deployed into the biopsy cavity. Follow up 2 view mammogram was performed and dictated separately. IMPRESSION: Ultrasound guided biopsy of a right 7 o'clock breast mass and a right axillary lymph node. No apparent complications. Electronically Signed: By: Dorise Bullion III M.D On: 06/25/2020 10:04     ELIGIBLE FOR AVAILABLE RESEARCH PROTOCOL: No  ASSESSMENT: 55 y.o. [place], [place] woman  (1) status post left lumpectomy 03/22/2016 showing atypical lobular hyperplasia  (2) prophylactic tamoxifen taken from 04/2016 through 03/2019  (a) status post hysterectomy with bilateral salpingo-oophorectomy February 2021  (3) genetic testing 05/22/2019 through the my risk hereditary cancer panel offered by [employer] found no deleterious mutations in BRCA, TP53, PALB2 or any of the other genes tested  (a) variants of uncertain significance were found in BRIP1 and NBN  RIGHT BREAST CANCER (4) right breast lower outer quadrant biopsy 06/25/2020 shows a clinical T2 N1, stage IB invasive ductal carcinoma, grade 2 or 3, triple positive, with an MIB-1 of 40%.  (5) neoadjuvant chemotherapy will consist of carboplatin, docetaxel, trastuzumab and pertuzumab every 21 days x 6 starting July 25, 2019  (6) anti-HER2 treatment to be continued to complete 1 year  (a) echocardiogram  (7) definitive surgery to follow  (8) adjuvant radiation  (9) anastrozole to start at the completion of local treatment   PLAN: I met with Alyse Low and her husband today to go over some of the questions that they had.  These involve genetics, when their daughters should start  mammography, whether the grandchildren should to be doing  something now to prevent cancer later, and also of course questions related relating to Christy's only cancer and its treatment.  We reviewed the fact that chemotherapy tends to lower the risk of recurrence by one third and that anti-HER2 immunotherapy and antiestrogens each cut the risk by one half so that if Alyse Low is a risk of recurrence with local treatment only is in the 60% range, this can be cut to about 10% with the standard treatment she is about to receive.  This would mean she would have a 96% chance of cure.  They had a good understanding of the possible toxicities side effects and complications after meeting with our chemotherapy teaching nurse.  Marita Kansas is getting her port placed today.  The MRI results are pending.  She will see my 40 assistant [phone], who will ask her to keep a symptom diary; Alyse Low will have her first treatment on 6/13, and I am going to see her 06/ 22 troubleshoot side effects  Total encounter time 20 minutes.Sarajane Jews C. Magrinat, MD 07/05/2020 10:51 PM Medical Oncology and Hematology [facility] [place], [place] [address] Tel. [phone]    Fax. [phone]   This document serves as a record of services personally performed by Lurline Del, MD. It was created on his behalf by Wilburn Mylar, a trained medical scribe. The creation of this record is based on the scribe's personal observations and the provider's statements to them.   I, Lurline Del MD, have reviewed the above documentation for accuracy and completeness, and I agree with the above.   *Total Encounter Time as defined by the Centers for Medicare and Medicaid Services includes, in addition to the face-to-face time of a patient visit (documented in the note above) non-face-to-face time: obtaining and reviewing outside history, ordering and reviewing medications, tests or procedures, care coordination (communications with other health care  professionals or caregivers) and documentation in the medical record.

## 2020-07-07 ENCOUNTER — Telehealth: Payer: Self-pay | Admitting: Adult Health

## 2020-07-07 ENCOUNTER — Inpatient Hospital Stay: Payer: 59

## 2020-07-07 ENCOUNTER — Ambulatory Visit: Payer: 59 | Admitting: Oncology

## 2020-07-07 ENCOUNTER — Inpatient Hospital Stay (HOSPITAL_BASED_OUTPATIENT_CLINIC_OR_DEPARTMENT_OTHER): Payer: 59 | Admitting: Oncology

## 2020-07-07 ENCOUNTER — Encounter (HOSPITAL_COMMUNITY): Payer: Self-pay | Admitting: General Surgery

## 2020-07-07 ENCOUNTER — Ambulatory Visit (HOSPITAL_COMMUNITY): Payer: 59 | Admitting: Certified Registered Nurse Anesthetist

## 2020-07-07 ENCOUNTER — Ambulatory Visit
Admission: RE | Admit: 2020-07-07 | Discharge: 2020-07-07 | Disposition: A | Discharge status: Home / Self Care | Payer: 59 | Source: Ambulatory Visit | Attending: Oncology | Admitting: Oncology

## 2020-07-07 ENCOUNTER — Ambulatory Visit (HOSPITAL_COMMUNITY): Payer: 59

## 2020-07-07 ENCOUNTER — Encounter: Payer: Self-pay | Admitting: Oncology

## 2020-07-07 ENCOUNTER — Other Ambulatory Visit: Payer: Self-pay

## 2020-07-07 ENCOUNTER — Ambulatory Visit (HOSPITAL_COMMUNITY)
Admission: RE | Admit: 2020-07-07 | Discharge: 2020-07-07 | Disposition: A | Discharge status: Home / Self Care | Payer: 59 | Attending: General Surgery | Admitting: General Surgery

## 2020-07-07 ENCOUNTER — Encounter (HOSPITAL_COMMUNITY)
Admission: RE | Disposition: A | Discharge status: Home / Self Care | Payer: Self-pay | Source: Home / Self Care | Attending: General Surgery

## 2020-07-07 DIAGNOSIS — Z170 Estrogen receptor positive status [ER+]: Secondary | ICD-10-CM

## 2020-07-07 DIAGNOSIS — Z803 Family history of malignant neoplasm of breast: Secondary | ICD-10-CM | POA: Diagnosis not present

## 2020-07-07 DIAGNOSIS — Z809 Family history of malignant neoplasm, unspecified: Secondary | ICD-10-CM | POA: Diagnosis not present

## 2020-07-07 DIAGNOSIS — C50511 Malignant neoplasm of lower-outer quadrant of right female breast: Secondary | ICD-10-CM

## 2020-07-07 DIAGNOSIS — Z800 Family history of malignant neoplasm of digestive organs: Secondary | ICD-10-CM | POA: Insufficient documentation

## 2020-07-07 DIAGNOSIS — Z452 Encounter for adjustment and management of vascular access device: Secondary | ICD-10-CM

## 2020-07-07 DIAGNOSIS — Z8042 Family history of malignant neoplasm of prostate: Secondary | ICD-10-CM | POA: Insufficient documentation

## 2020-07-07 DIAGNOSIS — Z808 Family history of malignant neoplasm of other organs or systems: Secondary | ICD-10-CM | POA: Insufficient documentation

## 2020-07-07 DIAGNOSIS — Z90711 Acquired absence of uterus with remaining cervical stump: Secondary | ICD-10-CM | POA: Insufficient documentation

## 2020-07-07 DIAGNOSIS — Z95828 Presence of other vascular implants and grafts: Secondary | ICD-10-CM

## 2020-07-07 HISTORY — DX: Personal history of urinary calculi: Z87.442

## 2020-07-07 HISTORY — DX: Nausea with vomiting, unspecified: R11.2

## 2020-07-07 HISTORY — DX: Other specified postprocedural states: Z98.890

## 2020-07-07 HISTORY — PX: PORTACATH PLACEMENT: SHX2246

## 2020-07-07 IMAGING — MR MR BREAST BILAT WO/W CM
8 of 13 series · 31 of 48 positions shown · IV contrast (Multihance)
Comparison: Previous exam(s).  No previous breast MRI.

CLINICAL DATA: New diagnosis right breast carcinoma. History of a
left lumpectomy in [3R] for atypical cells. Family history breast
carcinoma diagnosed in her mother and 2 aunts unknown ages.

LABS:  No labs drawn at time of imaging.
EXAM:
BILATERAL BREAST MRI WITH AND WITHOUT CONTRAST
TECHNIQUE: Multiplanar, multisequence MR images of both breasts were obtained
prior to and following the intravenous administration of 6 ml of
Gadavist

[Series 2: t2_tirm_tra ipat (a-p) · axial · 3.0mm · 0.70mm/px · 1 of 60 slices shown]
[im 1/60]
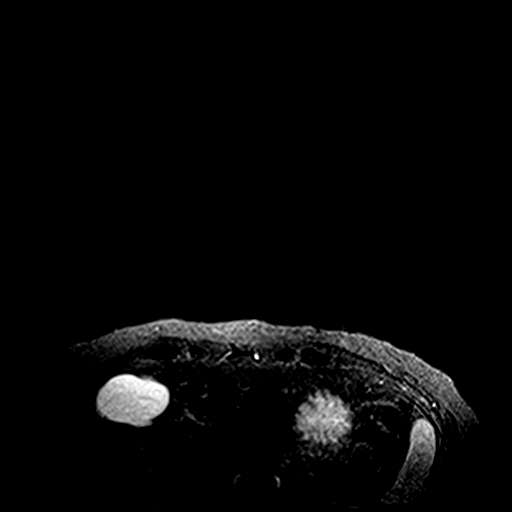

[Series 3: fl3d pre-cm no · axial · non-contrast · 1.2mm · 0.94mm/px · z∈[-71,+120]mm · 5 of 160 slices shown]
[im 1/160]
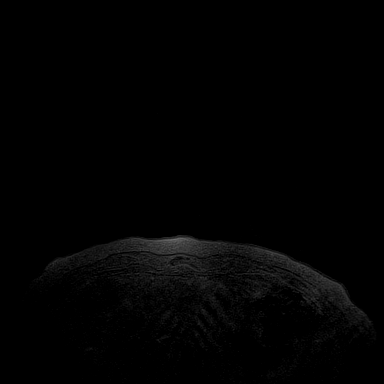
[im 40/160]
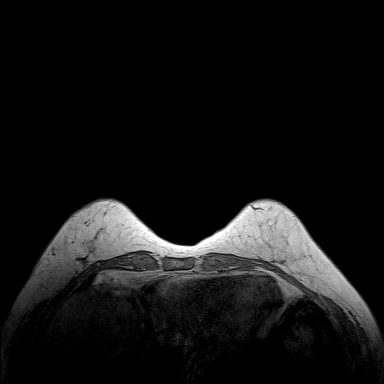
[im 80/160]
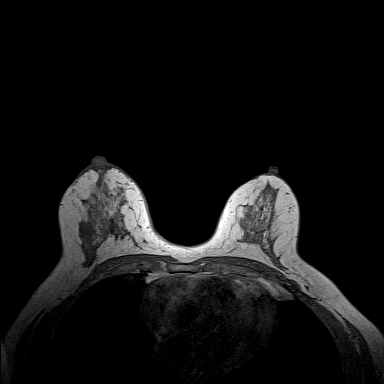
[im 120/160]
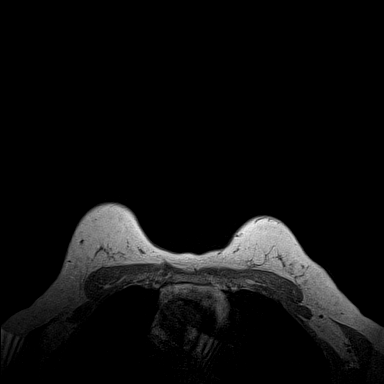
[im 160/160]
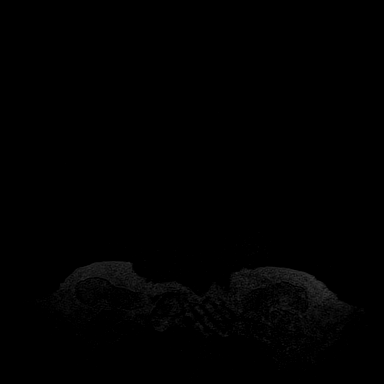

[Series 4: fl3d pre-cm · axial · non-contrast · 1.2mm · 0.94mm/px · z∈[-71,+120]mm · 5 of 160 slices shown]
[im 1/160]
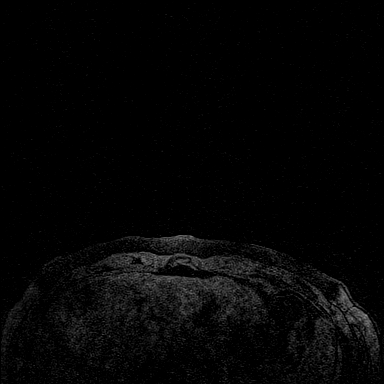
[im 40/160]
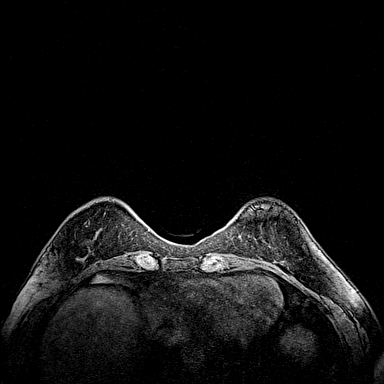
[im 80/160]
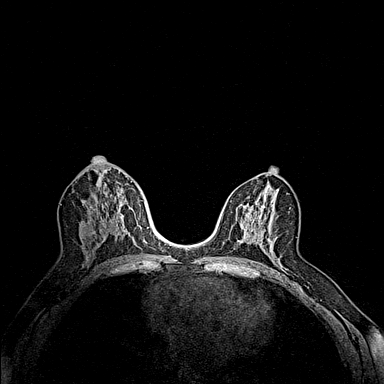
[im 120/160]
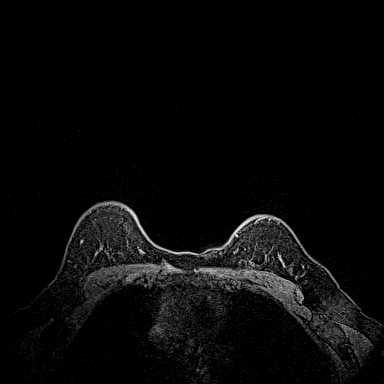
[im 160/160]
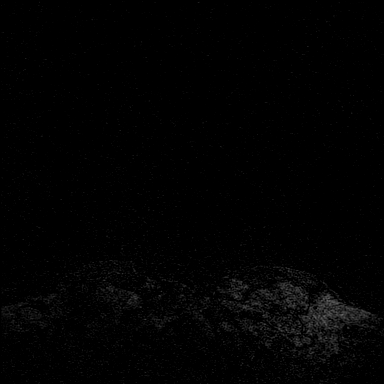

[Series 5: fl3d post-cm 20 · axial · 1.2mm · 0.94mm/px · z∈[-71,+120]mm · 5 of 160 slices shown (1 of 3)]
[im 1/160]
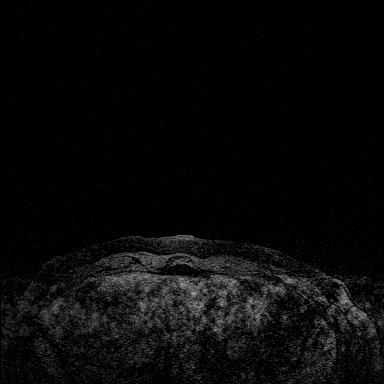
[im 40/160]
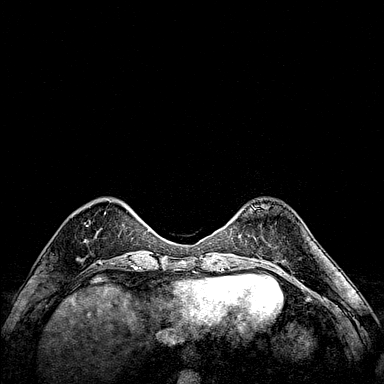
[im 80/160]
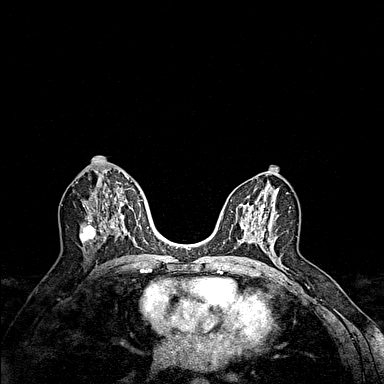
[im 120/160]
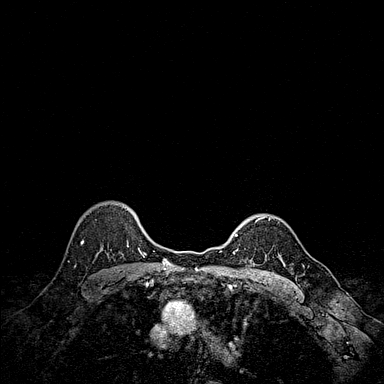
[im 160/160]
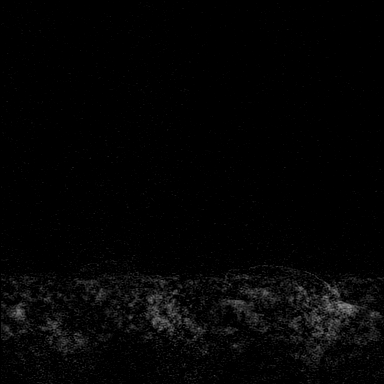

[Series 6: fl3d post-cm 20 · axial · 1.2mm · 0.94mm/px · z∈[-71,+120]mm · 5 of 160 slices shown (2 of 3)]
[im 1/160]
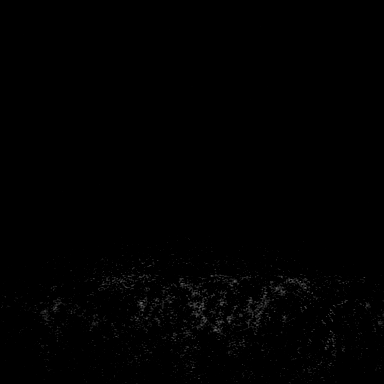
[im 40/160]
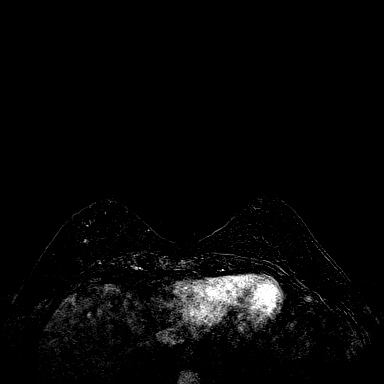
[im 80/160]
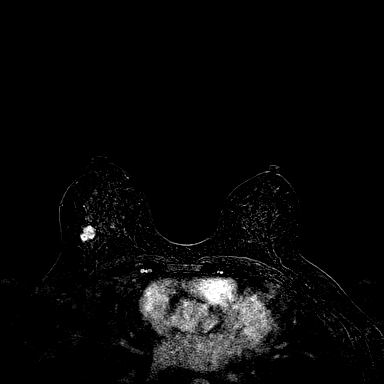
[im 120/160]
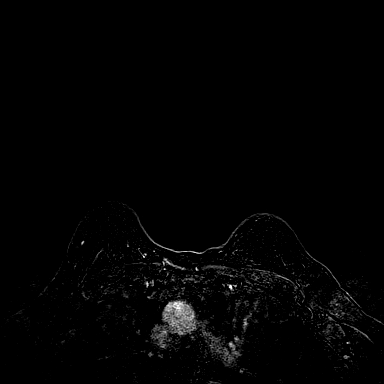
[im 160/160]
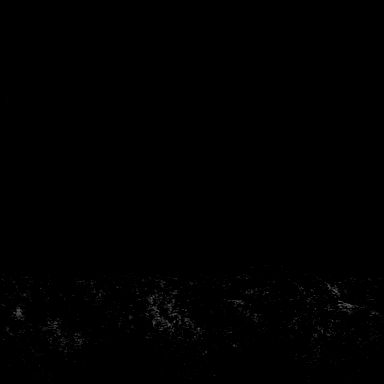

[Series 7: fl3d post-cm 20 · axial · 192.0mm · 0.94mm/px · 1 of 1 slices shown (3 of 3)]
[im 1/1]
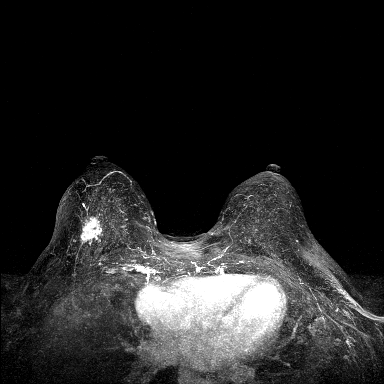

[Series 8: fl3d post-cm 3min · axial · 1.2mm · 0.94mm/px · z∈[-71,+120]mm · 5 of 160 slices shown]
[im 1/160]
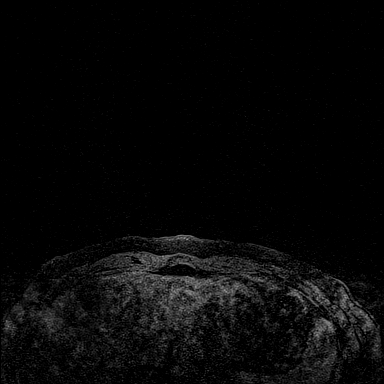
[im 40/160]
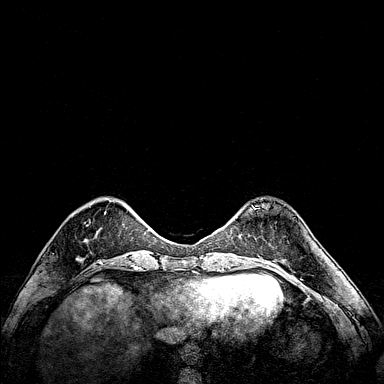
[im 80/160]
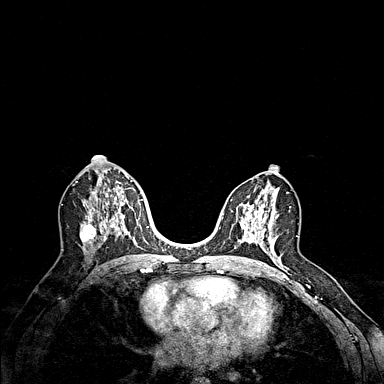
[im 120/160]
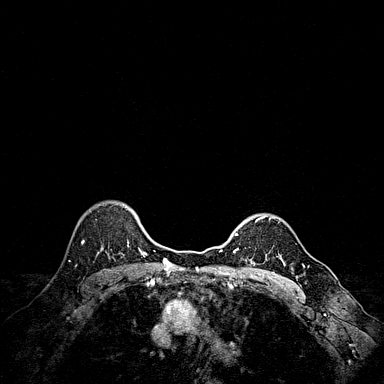
[im 160/160]
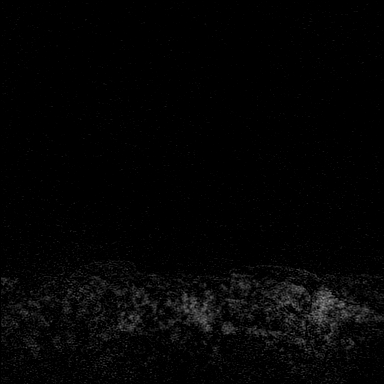

[Series 9: fl3d post-cm 3min_sub · axial · 1.2mm · 0.94mm/px · z∈[-71,+43]mm · 4 of 160 slices shown]
[im 1/160]
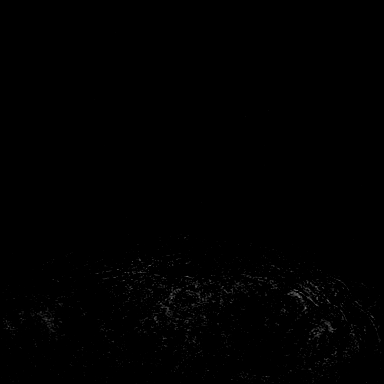
[im 32/160]
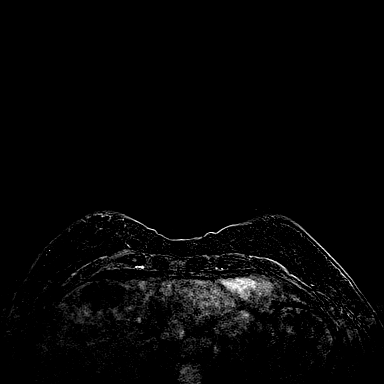
[im 64/160]
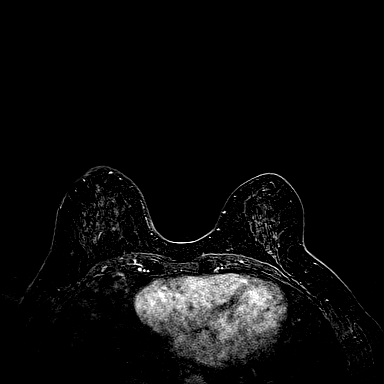
[im 96/160]
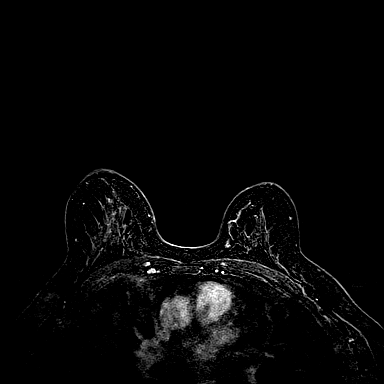

[31 of 48 positions shown; findings below may reference images not displayed]

Three-dimensional MR images were rendered by post-processing of the
original MR data on an independent workstation. The
three-dimensional MR images were interpreted, and findings are
reported in the following complete MRI report for this study. Three
dimensional images were evaluated at the independent interpreting
workstation using the DynaCAD thin client.
FINDINGS: Breast composition: c. Heterogeneous fibroglandular tissue.

Background parenchymal enhancement: Mild

Right breast: Irregular mass in the lower outer quadrant, with
irregular margins and some contiguous non mass enhancement. This
represents the recently diagnosed carcinoma demonstrating
susceptibility artifact within it from the biopsy marker clip. Mass
is centered on image 108/160, series 6, and measures 2.9 x 1.9 x
cm. BI-RADS 6: Known biopsy-proven malignancy.

There is a second enhancing mass, also irregular with irregular
margins, but better defined than the index malignancy. This lies
more superiorly, in the posterior, lateral right breast, near 9
o'clock, measuring 1.5 x 1.2 x 1.3 cm. BI-RADS 4: Suspicious.

No other right breast masses or areas of abnormal enhancement.

Left breast: No mass or abnormal enhancement.

Lymph nodes: 3 abnormal level 1 lymph nodes on the right, the
largest, 1.3 cm in short axis, contains artifact from the biopsy
marker clip reflecting the known metastatic lymph node.

Ancillary findings: Small enhancing lesion in the left upper sternum
just below the sternal manubrial joint, 5 mm in size, bright on T2,
nonspecific. Areas of asymmetric enhancement along the right
internal mammary chain, which appear vascular. No defined internal
mammary lymph node. 5.4 cm cyst in the anterior aspect of the liver.
IMPRESSION: 1. Irregularly enhancing mass in the lower outer quadrant of the
right breast reflects the recently diagnosed breast malignancy,
measuring 2.9 x 1.9 x 1.9 cm.
2. Second, suspicious right breast enhancing mass, in the
posterolateral breast, above the index malignancy, centered on image
82/160, series 6, measuring 1.5 cm.
3. Three abnormal appearing level 1 right axillary lymph nodes, 1 of
which, the largest, reflects the biopsy-proven metastatic lymph
node.
4. 5 mm enhancing, T2 signal hyperintense well-defined lesion in the
left upper sternum, nonspecific. Metastatic disease is possible.

RECOMMENDATION:
1. Targeted right breast ultrasound to try and localize the second
smaller enhancing right breast mass in the posterior breast at 9
o'clock, with ultrasound-guided core needle biopsy if the lesion can
be visualized. If the mass cannot be visualized sonographically,
then MRI guided core needle biopsy would be recommended.

BI-RADS CATEGORY  4: Suspicious.

## 2020-07-07 IMAGING — DX DG CHEST 1V PORT
1 series · 1 of 1 positions shown · non-contrast
Comparison: None.

CLINICAL DATA: Postop left Port-A-Cath.

EXAM:
PORTABLE CHEST 1 VIEW

[chest ap]
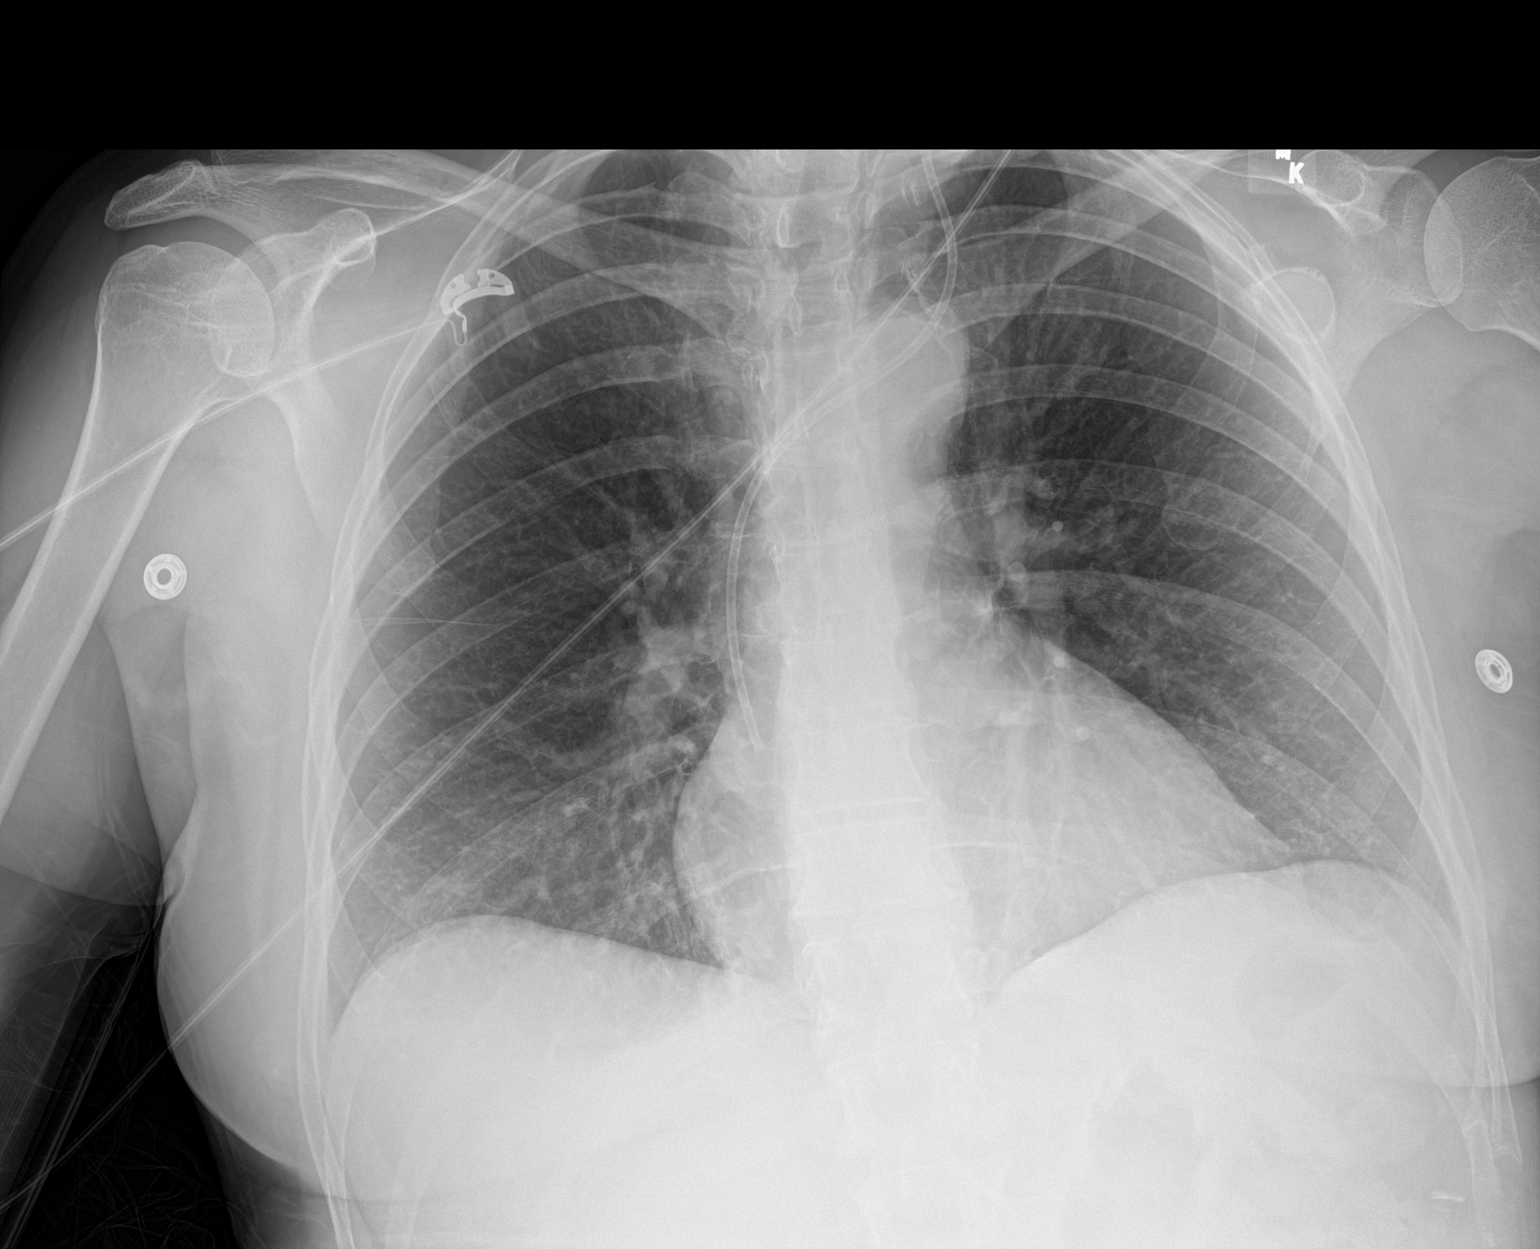

[1 of 1 positions shown; findings below may reference images not displayed]

FINDINGS: Left chest port tip in the lower SVC. No pneumothorax. Lungs are
clear. Normal heart size and mediastinal contours. No pulmonary
edema. No pleural fluid. No pulmonary mass or dominant nodule. No
acute osseous abnormalities are seen.
IMPRESSION: Left chest port tip in the lower SVC. No pneumothorax.

## 2020-07-07 SURGERY — INSERTION, TUNNELED CENTRAL VENOUS DEVICE, WITH PORT
Anesthesia: General | Site: Chest | Laterality: Left

## 2020-07-07 MED ORDER — MIDAZOLAM HCL 2 MG/2ML IJ SOLN
INTRAMUSCULAR | Status: AC
  Filled 2020-07-07: qty 2

## 2020-07-07 MED ORDER — GADOBUTROL 1 MMOL/ML IV SOLN
6.0000 mL | Freq: Once | INTRAVENOUS | Status: AC | PRN
  Administered 2020-07-07: 6 mL via INTRAVENOUS

## 2020-07-07 MED ORDER — ACETAMINOPHEN 325 MG PO TABS
325.0000 mg | ORAL_TABLET | ORAL | Status: DC | PRN
Start: 2020-07-07 — End: 2020-07-08

## 2020-07-07 MED ORDER — GABAPENTIN 300 MG PO CAPS
300.0000 mg | ORAL_CAPSULE | ORAL | Status: AC
  Administered 2020-07-07: 300 mg via ORAL

## 2020-07-07 MED ORDER — 0.9 % SODIUM CHLORIDE (POUR BTL) OPTIME
TOPICAL | Status: DC | PRN
  Administered 2020-07-07: 1000 mL

## 2020-07-07 MED ORDER — CHLORHEXIDINE GLUCONATE CLOTH 2 % EX PADS: 6.0000 | MEDICATED_PAD | Freq: Once | CUTANEOUS | Status: DC

## 2020-07-07 MED ORDER — BUPIVACAINE HCL (PF) 0.25 % IJ SOLN
INTRAMUSCULAR | Status: AC
  Filled 2020-07-07: qty 30

## 2020-07-07 MED ORDER — KETOROLAC TROMETHAMINE 30 MG/ML IJ SOLN
INTRAMUSCULAR | Status: AC
  Administered 2020-07-07: 30 mg
  Filled 2020-07-07: qty 1

## 2020-07-07 MED ORDER — ACETAMINOPHEN 160 MG/5ML PO SOLN: 325.0000 mg | ORAL | Status: DC | PRN

## 2020-07-07 MED ORDER — MIDAZOLAM HCL 5 MG/5ML IJ SOLN
INTRAMUSCULAR | Status: DC | PRN
  Administered 2020-07-07: 2 mg via INTRAVENOUS

## 2020-07-07 MED ORDER — HYDROCODONE-ACETAMINOPHEN 5-325 MG PO TABS: 1.0000 | ORAL_TABLET | Freq: Four times a day (QID) | ORAL | 0 refills | Status: DC | PRN

## 2020-07-07 MED ORDER — HEPARIN SOD (PORK) LOCK FLUSH 100 UNIT/ML IV SOLN
INTRAVENOUS | Status: DC | PRN
  Administered 2020-07-07: 500 [IU] via INTRAVENOUS

## 2020-07-07 MED ORDER — LACTATED RINGERS IV SOLN: INTRAVENOUS | Status: DC

## 2020-07-07 MED ORDER — EPHEDRINE 5 MG/ML INJ
INTRAVENOUS | Status: AC
  Filled 2020-07-07: qty 10

## 2020-07-07 MED ORDER — PROPOFOL 10 MG/ML IV BOLUS
INTRAVENOUS | Status: AC
  Filled 2020-07-07: qty 20

## 2020-07-07 MED ORDER — ONDANSETRON HCL 4 MG/2ML IJ SOLN
INTRAMUSCULAR | Status: AC
  Filled 2020-07-07: qty 2

## 2020-07-07 MED ORDER — FENTANYL CITRATE (PF) 100 MCG/2ML IJ SOLN
INTRAMUSCULAR | Status: AC
  Filled 2020-07-07: qty 2

## 2020-07-07 MED ORDER — FENTANYL CITRATE (PF) 100 MCG/2ML IJ SOLN
25.0000 ug | INTRAMUSCULAR | Status: DC | PRN
  Administered 2020-07-07 (×2): 25 ug via INTRAVENOUS

## 2020-07-07 MED ORDER — OXYCODONE HCL 5 MG PO TABS: 5.0000 mg | ORAL_TABLET | Freq: Once | ORAL | Status: DC | PRN

## 2020-07-07 MED ORDER — LIDOCAINE 2% (20 MG/ML) 5 ML SYRINGE
INTRAMUSCULAR | Status: DC | PRN
  Administered 2020-07-07: 40 mg via INTRAVENOUS

## 2020-07-07 MED ORDER — FENTANYL CITRATE (PF) 100 MCG/2ML IJ SOLN
INTRAMUSCULAR | Status: DC | PRN
  Administered 2020-07-07: 25 ug via INTRAVENOUS

## 2020-07-07 MED ORDER — KETOROLAC TROMETHAMINE 30 MG/ML IJ SOLN: 30.0000 mg | Freq: Once | INTRAMUSCULAR | Status: AC

## 2020-07-07 MED ORDER — MIDAZOLAM HCL 2 MG/2ML IJ SOLN
1.0000 mg | Freq: Once | INTRAMUSCULAR | Status: AC
  Administered 2020-07-07: 0.5 mg via INTRAVENOUS

## 2020-07-07 MED ORDER — ACETAMINOPHEN 500 MG PO TABS
1000.0000 mg | ORAL_TABLET | ORAL | Status: AC
  Administered 2020-07-07: 1000 mg via ORAL

## 2020-07-07 MED ORDER — CEFAZOLIN SODIUM-DEXTROSE 2-4 GM/100ML-% IV SOLN
INTRAVENOUS | Status: AC
  Filled 2020-07-07: qty 100

## 2020-07-07 MED ORDER — GABAPENTIN 300 MG PO CAPS
ORAL_CAPSULE | ORAL | Status: AC
  Filled 2020-07-07: qty 1

## 2020-07-07 MED ORDER — PROPOFOL 10 MG/ML IV BOLUS
INTRAVENOUS | Status: DC | PRN
  Administered 2020-07-07: 150 mg via INTRAVENOUS

## 2020-07-07 MED ORDER — SODIUM CHLORIDE 0.9 % IV SOLN: INTRAVENOUS | Status: DC | PRN

## 2020-07-07 MED ORDER — SODIUM CHLORIDE 0.9 % IV SOLN
INTRAVENOUS | Status: AC
  Filled 2020-07-07: qty 1.2

## 2020-07-07 MED ORDER — ACETAMINOPHEN 500 MG PO TABS
ORAL_TABLET | ORAL | Status: AC
  Filled 2020-07-07: qty 2

## 2020-07-07 MED ORDER — BUPIVACAINE HCL (PF) 0.25 % IJ SOLN
INTRAMUSCULAR | Status: DC | PRN
  Administered 2020-07-07: 10 mL

## 2020-07-07 MED ORDER — EPHEDRINE SULFATE-NACL 50-0.9 MG/10ML-% IV SOSY
PREFILLED_SYRINGE | INTRAVENOUS | Status: DC | PRN
  Administered 2020-07-07 (×2): 5 mg via INTRAVENOUS

## 2020-07-07 MED ORDER — ACETAMINOPHEN 10 MG/ML IV SOLN: 1000.0000 mg | Freq: Once | INTRAVENOUS | Status: DC | PRN

## 2020-07-07 MED ORDER — AMISULPRIDE (ANTIEMETIC) 5 MG/2ML IV SOLN: 10.0000 mg | Freq: Once | INTRAVENOUS | Status: DC | PRN

## 2020-07-07 MED ORDER — CEFAZOLIN SODIUM-DEXTROSE 2-4 GM/100ML-% IV SOLN
2.0000 g | INTRAVENOUS | Status: AC
  Administered 2020-07-07: 2 g via INTRAVENOUS

## 2020-07-07 MED ORDER — OXYCODONE HCL 5 MG/5ML PO SOLN: 5.0000 mg | Freq: Once | ORAL | Status: DC | PRN

## 2020-07-07 MED ORDER — FENTANYL CITRATE (PF) 250 MCG/5ML IJ SOLN
INTRAMUSCULAR | Status: AC
  Filled 2020-07-07: qty 5

## 2020-07-07 MED ORDER — DEXAMETHASONE SODIUM PHOSPHATE 10 MG/ML IJ SOLN
INTRAMUSCULAR | Status: AC
  Filled 2020-07-07: qty 1

## 2020-07-07 MED ORDER — ONDANSETRON HCL 4 MG/2ML IJ SOLN
INTRAMUSCULAR | Status: DC | PRN
  Administered 2020-07-07: 4 mg via INTRAVENOUS

## 2020-07-07 MED ORDER — PROMETHAZINE HCL 25 MG/ML IJ SOLN: 6.2500 mg | INTRAMUSCULAR | Status: DC | PRN

## 2020-07-07 MED ORDER — DEXAMETHASONE SODIUM PHOSPHATE 4 MG/ML IJ SOLN
INTRAMUSCULAR | Status: DC | PRN
  Administered 2020-07-07: 5 mg via INTRAVENOUS

## 2020-07-07 MED ORDER — HEPARIN SOD (PORK) LOCK FLUSH 100 UNIT/ML IV SOLN
INTRAVENOUS | Status: AC
  Filled 2020-07-07: qty 5

## 2020-07-07 MED ORDER — ORAL CARE MOUTH RINSE: 15.0000 mL | Freq: Once | OROMUCOSAL | Status: AC

## 2020-07-07 MED ORDER — CHLORHEXIDINE GLUCONATE 0.12 % MT SOLN
15.0000 mL | Freq: Once | OROMUCOSAL | Status: AC
  Administered 2020-07-07: 15 mL via OROMUCOSAL
  Filled 2020-07-07: qty 15

## 2020-07-07 SURGICAL SUPPLY — 35 items
ADH SKN CLS APL DERMABOND .7 (GAUZE/BANDAGES/DRESSINGS) ×1
BAG DECANTER FOR FLEXI CONT (MISCELLANEOUS) ×2 IMPLANT
CHLORAPREP W/TINT 10.5 ML (MISCELLANEOUS) ×2 IMPLANT
COVER SURGICAL LIGHT HANDLE (MISCELLANEOUS) ×2 IMPLANT
COVER TRANSDUCER ULTRASND GEL (DISPOSABLE) ×2 IMPLANT
COVER WAND RF STERILE (DRAPES) ×2 IMPLANT
DECANTER SPIKE VIAL GLASS SM (MISCELLANEOUS) ×2 IMPLANT
DERMABOND ADVANCED (GAUZE/BANDAGES/DRESSINGS) ×1
DERMABOND ADVANCED .7 DNX12 (GAUZE/BANDAGES/DRESSINGS) ×1 IMPLANT
DRAPE C-ARM 42X120 X-RAY (DRAPES) ×2 IMPLANT
ELECT CAUTERY BLADE 6.4 (BLADE) ×2 IMPLANT
ELECT REM PT RETURN 9FT ADLT (ELECTROSURGICAL) ×2
ELECTRODE REM PT RTRN 9FT ADLT (ELECTROSURGICAL) ×1 IMPLANT
GEL ULTRASOUND 20GR AQUASONIC (MISCELLANEOUS) ×1 IMPLANT
GLOVE BIO SURGEON STRL SZ7.5 (GLOVE) ×2 IMPLANT
GOWN STRL REUS W/ TWL LRG LVL3 (GOWN DISPOSABLE) ×2 IMPLANT
GOWN STRL REUS W/TWL LRG LVL3 (GOWN DISPOSABLE) ×4
KIT BASIN OR (CUSTOM PROCEDURE TRAY) ×2 IMPLANT
KIT PORT POWER 8FR ISP CVUE (Port) ×1 IMPLANT
KIT TURNOVER KIT B (KITS) ×2 IMPLANT
NEEDLE 22X1 1/2 (OR ONLY) (NEEDLE) ×1 IMPLANT
NS IRRIG 1000ML POUR BTL (IV SOLUTION) ×2 IMPLANT
PAD ARMBOARD 7.5X6 YLW CONV (MISCELLANEOUS) ×2 IMPLANT
PENCIL BUTTON HOLSTER BLD 10FT (ELECTRODE) ×2 IMPLANT
POSITIONER HEAD DONUT 9IN (MISCELLANEOUS) ×2 IMPLANT
SHEATH COOK PEEL AWAY SET 9F (SHEATH) IMPLANT
SUT MNCRL AB 4-0 PS2 18 (SUTURE) ×2 IMPLANT
SUT PROLENE 2 0 SH 30 (SUTURE) ×2 IMPLANT
SUT VIC AB 3-0 SH 27 (SUTURE) ×2
SUT VIC AB 3-0 SH 27XBRD (SUTURE) ×1 IMPLANT
SYR 10ML LL (SYRINGE) ×1 IMPLANT
SYR 5ML LUER SLIP (SYRINGE) ×2 IMPLANT
TOWEL GREEN STERILE (TOWEL DISPOSABLE) ×2 IMPLANT
TOWEL GREEN STERILE FF (TOWEL DISPOSABLE) ×2 IMPLANT
TRAY LAPAROSCOPIC MC (CUSTOM PROCEDURE TRAY) ×2 IMPLANT

## 2020-07-07 NOTE — Anesthesia Procedure Notes (Addendum)
Procedure Name: LMA Insertion Date/Time: 07/07/2020 4:05 PM Performed by: Alain Marion, CRNA Pre-anesthesia Checklist: Patient identified, Emergency Drugs available, Suction available and Patient being monitored Patient Re-evaluated:Patient Re-evaluated prior to induction Oxygen Delivery Method: Circle System Utilized Preoxygenation: Pre-oxygenation with 100% oxygen Induction Type: IV induction Ventilation: Mask ventilation without difficulty LMA: LMA inserted LMA Size: 4.0 Number of attempts: 1 Airway Equipment and Method: Bite block Placement Confirmation: positive ETCO2 Tube secured with: Tape Dental Injury: Teeth and Oropharynx as per pre-operative assessment  Comments: Placed by [facility]

## 2020-07-07 NOTE — Interval H&P Note (Signed)
History and Physical Interval Note:  07/07/2020 3:32 PM  Kim Archer  has presented today for surgery, with the diagnosis of RIGHT BREAST CANCER.  The various methods of treatment have been discussed with the patient and family. After consideration of risks, benefits and other options for treatment, the patient has consented to  Procedure(s): INSERTION PORT-A-CATH (N/A) as a surgical intervention.  The patient's history has been reviewed, patient examined, no change in status, stable for surgery.  I have reviewed the patient's chart and labs.  Questions were answered to the patient's satisfaction.     Autumn Messing III

## 2020-07-07 NOTE — Anesthesia Postprocedure Evaluation (Signed)
Anesthesia Post Note  Patient: Kim Archer  Procedure(s) Performed: INSERTION PORT-A-CATH (Left Chest)     Patient location during evaluation: PACU Anesthesia Type: General Level of consciousness: awake and alert Pain management: pain level controlled Vital Signs Assessment: post-procedure vital signs reviewed and stable Respiratory status: spontaneous breathing, nonlabored ventilation, respiratory function stable and patient connected to nasal cannula oxygen Cardiovascular status: blood pressure returned to baseline and stable Postop Assessment: no apparent nausea or vomiting Anesthetic complications: no   No complications documented.  Last Vitals:  Vitals:   07/07/20 1809 07/07/20 1818  BP: 124/78 127/81  Pulse: 61 65  Resp: 12 14  Temp:  36.6 C  SpO2: 94% 98%    Last Pain:  Vitals:   07/07/20 1809  TempSrc:   PainSc: 2                  Effie Berkshire

## 2020-07-07 NOTE — H&P (Signed)
Kim Archer  Location: [facility] Surgery Patient #: [id number] DOB: 1965-09-11 Married / Language: English / Race: White Female   History of Present Illness The patient is a 55 year old female who presents with breast cancer. We are asked to see the patient in consultation by Dr. [name] to evaluate her for a new right breast cancer. The patient is a 55 year old white female who recently went for a routine screening mammogram. At that time she was found to have an asymmetry in the lower outer right breast that measured 2.4 cm. There was one abnormal looking lymph node. Both were biopsied and came back as grade 3 invasive ductal cancer that was triple positive with a Ki-67 of 40%. She is otherwise in pretty good health and does not smoke. She has previously had genetic testing done   Past Surgical History  Breast Mass; Local Excision  Left. Cesarean Section - Multiple  Hysterectomy (not due to cancer) - Partial   Diagnostic Studies History  Colonoscopy  1-5 years ago Mammogram  within last year Pap Smear  1-5 years ago  Medication History Medications Reconciled  Social History  Alcohol use  Occasional alcohol use. Caffeine use  Carbonated beverages, Tea. No drug use  Tobacco use  Never smoker.  Family History Arthritis  Father, Mother. Breast Cancer  Family Members In General, Mother. Cancer  Brother, Sister. Cerebrovascular Accident  Father. Colon Cancer  Family Members In General. Heart Disease  Mother. Hypertension  Mother. Ischemic Bowel Disease  Father. Kidney Disease  Mother. Malignant Neoplasm Of Pancreas  Brother. Melanoma  Family Members In General, Father. Prostate Cancer  Family Members In General.  Pregnancy / Birth History Age at menarche  72 years. Contraceptive History  Oral contraceptives. Gravida  3 Length (months) of breastfeeding  12-24 7-12 Maternal age  37-20 Para  3 Regular periods   Other  Problems  Back Pain  General anesthesia - complications  Hemorrhoids  Kidney Stone  Lump In Breast     Review of Systems  General Not Present- Appetite Loss, Chills, Fatigue, Fever, Night Sweats, Weight Gain and Weight Loss. Skin Not Present- Change in Wart/Mole, Dryness, Hives, Jaundice, New Lesions, Non-Healing Wounds, Rash and Ulcer. HEENT Present- Seasonal Allergies and Wears glasses/contact lenses. Not Present- Earache, Hearing Loss, Hoarseness, Nose Bleed, Oral Ulcers, Ringing in the Ears, Sinus Pain, Sore Throat, Visual Disturbances and Yellow Eyes. Respiratory Not Present- Bloody sputum, Chronic Cough, Difficulty Breathing, Snoring and Wheezing. Breast Present- Breast Pain. Not Present- Breast Mass, Nipple Discharge and Skin Changes. Cardiovascular Not Present- Chest Pain, Difficulty Breathing Lying Down, Leg Cramps, Palpitations, Rapid Heart Rate, Shortness of Breath and Swelling of Extremities. Gastrointestinal Present- Constipation. Not Present- Abdominal Pain, Bloating, Bloody Stool, Change in Bowel Habits, Chronic diarrhea, Difficulty Swallowing, Excessive gas, Gets full quickly at meals, Hemorrhoids, Indigestion, Nausea, Rectal Pain and Vomiting. Female Genitourinary Present- Frequency. Not Present- Nocturia, Painful Urination, Pelvic Pain and Urgency. Musculoskeletal Present- Back Pain, Joint Stiffness, Muscle Pain and Swelling of Extremities. Not Present- Joint Pain and Muscle Weakness. Neurological Present- Weakness. Not Present- Decreased Memory, Fainting, Headaches, Numbness, Seizures, Tingling, Tremor and Trouble walking. Psychiatric Present- Change in Sleep Pattern. Not Present- Anxiety, Bipolar, Depression, Fearful and Frequent crying. Endocrine Not Present- Cold Intolerance, Excessive Hunger, Hair Changes, Heat Intolerance, Hot flashes and New Diabetes.   Physical Exam General Mental Status-Alert. General Appearance-Consistent with stated  age. Hydration-Well hydrated. Voice-Normal.  Head and Neck Head-normocephalic, atraumatic with no lesions or palpable masses. Trachea-midline. Thyroid Gland  Characteristics - normal size and consistency.  Eye Eyeball - Bilateral-Extraocular movements intact. Sclera/Conjunctiva - Bilateral-No scleral icterus.  Chest and Lung Exam Chest and lung exam reveals -quiet, even and easy respiratory effort with no use of accessory muscles and on auscultation, normal breath sounds, no adventitious sounds and normal vocal resonance. Inspection Chest Wall - Normal. Back - normal.  Breast Note: There is a palpable bruise in the lower outer right breast. Other than this there is no other palpable masses in either breast. There is no palpable axillary, supraclavicular, or cervical lymphadenopathy.   Cardiovascular Cardiovascular examination reveals -normal heart sounds, regular rate and rhythm with no murmurs and normal pedal pulses bilaterally.  Abdomen Inspection Inspection of the abdomen reveals - No Hernias. Skin - Scar - no surgical scars. Palpation/Percussion Palpation and Percussion of the abdomen reveal - Soft, Non Tender, No Rebound tenderness, No Rigidity (guarding) and No hepatosplenomegaly. Auscultation Auscultation of the abdomen reveals - Bowel sounds normal.  Neurologic Neurologic evaluation reveals -alert and oriented x 3 with no impairment of recent or remote memory. Mental Status-Normal.  Musculoskeletal Normal Exam - Left-Upper Extremity Strength Normal and Lower Extremity Strength Normal. Normal Exam - Right-Upper Extremity Strength Normal and Lower Extremity Strength Normal.  Lymphatic Head & Neck  General Head & Neck Lymphatics: Bilateral - Description - Normal. Axillary  General Axillary Region: Bilateral - Description - Normal. Tenderness - Non Tender. Femoral & Inguinal  Generalized Femoral & Inguinal Lymphatics: Bilateral -  Description - Normal. Tenderness - Non Tender.    Assessment & Plan MALIGNANT NEOPLASM OF LOWER-OUTER QUADRANT OF RIGHT BREAST OF FEMALE, ESTROGEN RECEPTOR POSITIVE (C50.511) Impression: The patient appears to have a moderate sized cancer in the lower outer right breast with a positive lymph node. Because she is HER-2 positive she will need chemotherapy as part of her treatment plan. I think she would benefit from getting this neoadjuvant lead to try to down stage the axilla and allow her to potentially have a targeted node dissection with sentinel node biopsy rather than a full node dissection. I have discussed this with her and the entire team and all are in agreement. She will need a Port-A-Cath for chemotherapy. I have discussed with her in detail the risks and benefits of the operation as well as some of the technical aspects including the risk of pneumothorax and she understands and wishes to proceed. I will follow her response during chemotherapy and we will make a plan for definitive surgery near the end. This patient encounter took 60 minutes today to perform the following: take history, perform exam, review outside records, interpret imaging, counsel the patient on their diagnosis and document encounter, findings & plan in the EHR Current Plans Referred to Oncology, for evaluation and follow up (Oncology). Routine. Referred to Physical Therapy, for evaluation and follow up (Physical Therapy). Routine.

## 2020-07-07 NOTE — Anesthesia Preprocedure Evaluation (Signed)
Anesthesia Evaluation  Patient identified by MRN, date of birth, ID band Patient awake    Reviewed: Allergy & Precautions, NPO status , Patient's Chart, lab work & pertinent test results  Airway Mallampati: II  TM Distance: >3 FB Neck ROM: Full    Dental  (+) Teeth Intact   Pulmonary neg pulmonary ROS,    breath sounds clear to auscultation       Cardiovascular negative cardio ROS   Rhythm:Regular Rate:Normal     Neuro/Psych negative neurological ROS  negative psych ROS   GI/Hepatic negative GI ROS, Neg liver ROS,   Endo/Other  negative endocrine ROS  Renal/GU negative Renal ROS  negative genitourinary   Musculoskeletal negative musculoskeletal ROS (+)   Abdominal   Peds negative pediatric ROS (+)  Hematology negative hematology ROS (+)   Anesthesia Other Findings   Reproductive/Obstetrics negative OB ROS                             Anesthesia Physical  Anesthesia Plan  ASA: I  Anesthesia Plan: General   Post-op Pain Management:    Induction: Intravenous  PONV Risk Score and Plan:   Airway Management Planned: LMA  Additional Equipment:   Intra-op Plan:   Post-operative Plan: Extubation in OR  Informed Consent:     Dental advisory given  Plan Discussed with: CRNA  Anesthesia Plan Comments:         Anesthesia Quick Evaluation

## 2020-07-07 NOTE — Op Note (Signed)
07/07/2020  5:04 PM  PATIENT:  Kim Archer  55 y.o. female  PRE-OPERATIVE DIAGNOSIS:  RIGHT BREAST CANCER  POST-OPERATIVE DIAGNOSIS:  RIGHT BREAST CANCER  PROCEDURE:  Procedure(s): INSERTION PORT-A-CATH (Left)  SURGEON:  Surgeon(s) and Role:    * Jovita Kussmaul, MD - Primary  PHYSICIAN ASSISTANT:   ASSISTANTS: none   ANESTHESIA:   local and general  EBL:  5 mL   BLOOD ADMINISTERED:none  DRAINS: none   LOCAL MEDICATIONS USED:  MARCAINE     SPECIMEN:  No Specimen  DISPOSITION OF SPECIMEN:  N/A  COUNTS:  YES  TOURNIQUET:  * No tourniquets in log *  DICTATION: .Dragon Dictation   After informed consent was obtained the patient was brought to the operating room and placed in the supine position on the operating table.  After adequate induction of general anesthesia a roll was placed between the patient's shoulder blades to extend the shoulder slightly.  The left neck and chest area were then prepped with ChloraPrep, allowed to dry, and draped in usual sterile manner.  An appropriate timeout was performed.  The patient was placed in Trendelenburg position.  The area lateral to the bend of the clavicle was infiltrated with quarter percent Marcaine.  A large bore needle from the Port-A-Cath kit was used to slide beneath the bend of the clavicle heading towards the sternal notch.  I was not able to identify the left subclavian vein.  I was then able to move to the left internal jugular vein.  I was able to identify the vein with ultrasound and under direct vision cannulate the vein with a large bore needle from the Port-A-Cath kit.  A wire was then fed through the needle using the Seldinger technique without difficulty.  The wire was confirmed in the central venous system using real-time fluoroscopy.  Next a small incision was made on the left chest wall lateral to the bend of the clavicle with a 15 blade knife.  The incision was carried through the skin and subcutaneous tissue  sharply with the electrocautery.  A subcutaneous pocket was then created by blunt finger dissection inferior to the incision.  A small incision was also made in the skin at the wire entry site of the neck.  A hemostat was used to dissect a tunnel between the 2 incisions.  I was unable to place a tendon passer across the tunnel.  The tubing was then brought through the tunnel without difficulty.  The tubing was placed on the reservoir and the reservoir was placed in the pocket.  The length of the tubing was estimated using real-time fluoroscopy and cut to the appropriate length.  Next a sheath and dilator were fed over the wire using the Seldinger technique without difficulty.  The dilator and wire were removed from the patient.  The tubing was fed through the sheath as far as it would go and then held in place while the sheath was gently cracked and separated.  Another real-time fluoroscopy image showed the tip of the catheter to be in the distal superior vena cava.  The tubing was then permanently anchored to the reservoir.  The reservoir was anchored in the pocket with two 2-0 Prolene stitches.  The port was then aspirated and it aspirated blood easily.  The port was then flushed initially with a dilute heparin solution and then with a more concentrated heparin solution.  The incision on the left neck was closed with an interrupted 4-0 Monocryl subcuticular stitch.  The subcutaneous tissue was then closed over the port with interrupted 3-0 Vicryl stitches.  The skin was closed with a running 4-0 Monocryl subcuticular stitch.  Dermabond dressings were applied.  The patient tolerated the procedure well.  At the end of the case all needle sponge and instrument counts were correct.  The patient was then awakened and taken to recovery in stable condition.  PLAN OF CARE: Discharge to home after PACU  PATIENT DISPOSITION:  PACU - hemodynamically stable.   Delay start of Pharmacological VTE agent (>24hrs) due to  surgical blood loss or risk of bleeding: not applicable

## 2020-07-07 NOTE — Transfer of Care (Cosign Needed)
Immediate Anesthesia Transfer of Care Note  Patient: Kim Archer  Procedure(s) Performed: INSERTION PORT-A-CATH (Left Chest)  Patient Location: PACU  Anesthesia Type:General  Level of Consciousness: awake and drowsy  Airway & Oxygen Therapy: Patient Spontanous Breathing and Patient connected to face mask oxygen  Post-op Assessment: Report given to RN and Post -op Vital signs reviewed and stable  Post vital signs: Reviewed and stable  Last Vitals:  Vitals Value Taken Time  BP 119/67 07/07/20 1709  Temp    Pulse 60 07/07/20 1710  Resp 10 07/07/20 1710  SpO2 100 % 07/07/20 1710  Vitals shown include unvalidated device data.  Last Pain:  Vitals:   07/07/20 1417  TempSrc: Oral  PainSc: 4       Patients Stated Pain Goal: 4 (37/16/96 7893)  Complications: No complications documented.

## 2020-07-07 NOTE — Telephone Encounter (Signed)
R/s appts per 5/23 sch msg. Pt aware.  

## 2020-07-07 NOTE — Progress Notes (Signed)
Met with patient at registration to introduce myself as Arboriculturist and to offer available resources.  Discussed one-time $1000 Radio broadcast assistant to assist with personal expenses while going through treatment. Also, discussed available copay assistance for specific treatment drugs if she has not met her ded/OOP. She states she has met her ded and should be close to [facility].  Gave her [place] application for patients whom live in [place]. Advised if interested she can return to them along with supporting documents or to myself to submit via email.  She has my card for any additional financial questions or concerns.

## 2020-07-08 ENCOUNTER — Encounter (HOSPITAL_COMMUNITY): Payer: Self-pay | Admitting: General Surgery

## 2020-07-08 ENCOUNTER — Encounter: Payer: Self-pay | Admitting: *Deleted

## 2020-07-08 ENCOUNTER — Telehealth: Payer: Self-pay | Admitting: Oncology

## 2020-07-08 ENCOUNTER — Other Ambulatory Visit: Payer: Self-pay | Admitting: Oncology

## 2020-07-08 DIAGNOSIS — Z170 Estrogen receptor positive status [ER+]: Secondary | ICD-10-CM

## 2020-07-08 DIAGNOSIS — M899 Disorder of bone, unspecified: Secondary | ICD-10-CM | POA: Insufficient documentation

## 2020-07-08 DIAGNOSIS — C50511 Malignant neoplasm of lower-outer quadrant of right female breast: Secondary | ICD-10-CM

## 2020-07-08 NOTE — Progress Notes (Signed)
I called Kim Archer but was unable to reach her.  I did reach her husband Kim Archer and let him know that there is a questionable lesion in the sternum that requires further imaging and that I am setting her up for a CT of the chest and a bone scan.  She will call me back later to get the same information directly.

## 2020-07-08 NOTE — Telephone Encounter (Signed)
Scheduled appointment per 05/23 los. Patient will receive updated calender. 

## 2020-07-09 ENCOUNTER — Telehealth: Payer: Self-pay | Admitting: *Deleted

## 2020-07-09 ENCOUNTER — Encounter: Payer: Self-pay | Admitting: *Deleted

## 2020-07-09 DIAGNOSIS — C50511 Malignant neoplasm of lower-outer quadrant of right female breast: Secondary | ICD-10-CM

## 2020-07-09 DIAGNOSIS — Z170 Estrogen receptor positive status [ER+]: Secondary | ICD-10-CM

## 2020-07-09 NOTE — Telephone Encounter (Signed)
Spoke to pt concerning breast MRI results. Discussed MRI results and need for further imaging and bx of right breast. Received verbal understanding.  Denies questions or concerns regarding dx or treatment care plan. Encourage pt to call with needs. Received verbal understanding.

## 2020-07-10 ENCOUNTER — Other Ambulatory Visit: Payer: Self-pay | Admitting: *Deleted

## 2020-07-10 ENCOUNTER — Telehealth: Payer: Self-pay | Admitting: *Deleted

## 2020-07-10 MED ORDER — LORAZEPAM 0.5 MG PO TABS: 0.5000 mg | ORAL_TABLET | Freq: Three times a day (TID) | ORAL | 1 refills | Status: DC | PRN

## 2020-07-10 NOTE — Telephone Encounter (Signed)
Spoke with patient to r/s her scans due to her being out of town.  Confirmed appts  For CT and bone scan for 6/8 @1145  at [facility].  Patient is also requesting if something could be called in for anxiety with all this additional testing she is feeling the anxiety rising.  Called in Lorazepam for her to her preferred pharmacy. Patient verbalized understanding. No other needs at this time.

## 2020-07-11 ENCOUNTER — Ambulatory Visit
Admission: RE | Admit: 2020-07-11 | Discharge: 2020-07-11 | Disposition: A | Discharge status: Home / Self Care | Payer: 59 | Source: Ambulatory Visit | Attending: Oncology | Admitting: Oncology

## 2020-07-11 ENCOUNTER — Other Ambulatory Visit: Payer: Self-pay | Admitting: Oncology

## 2020-07-11 ENCOUNTER — Other Ambulatory Visit: Payer: Self-pay

## 2020-07-11 DIAGNOSIS — C50511 Malignant neoplasm of lower-outer quadrant of right female breast: Secondary | ICD-10-CM

## 2020-07-11 DIAGNOSIS — Z170 Estrogen receptor positive status [ER+]: Secondary | ICD-10-CM

## 2020-07-11 IMAGING — US US BREAST*R* LIMITED INC AXILLA
1 series · 5 of 5 positions shown · non-contrast
Comparison: Previous exam(s).

CLINICAL DATA: Biopsy proven invasive mammary carcinoma in the 7
o'clock region of the right breast and metastatic adenopathy. Recent
MRI showed a second mass in the 9 o'clock region of the right
breast.

EXAM:
ULTRASOUND OF THE RIGHT BREAST

[Series 1: us breast*right* limited inc axilla · 0.06mm/px · 5 of 5 slices shown]
[im 1/5]
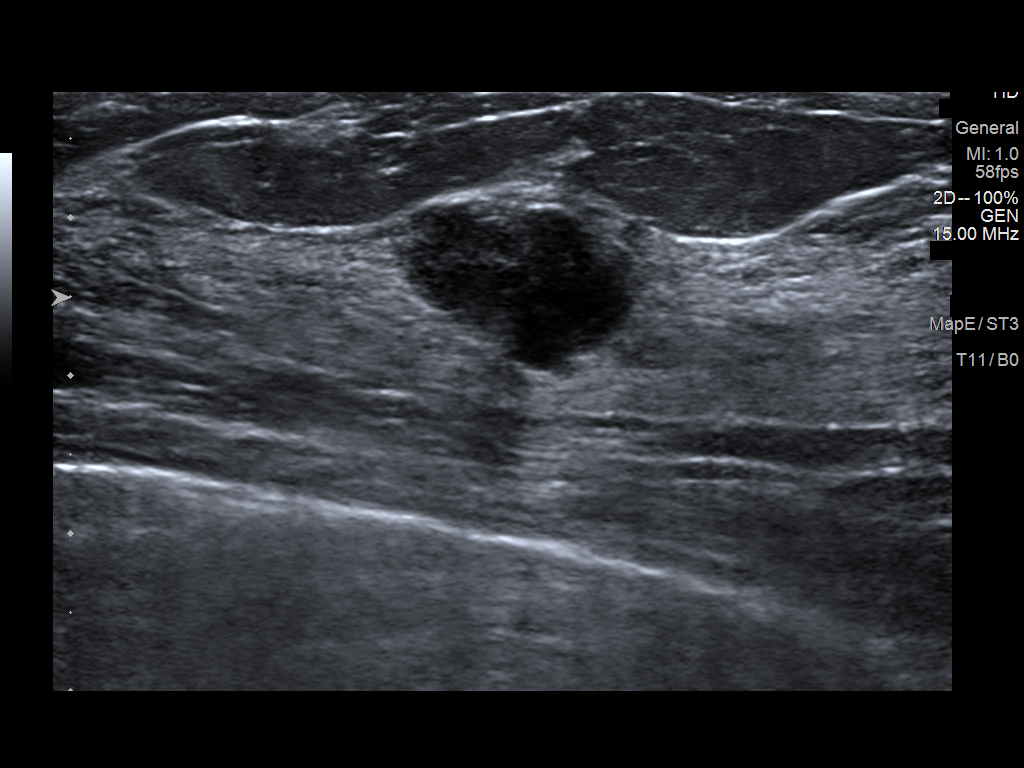
[im 2/5]
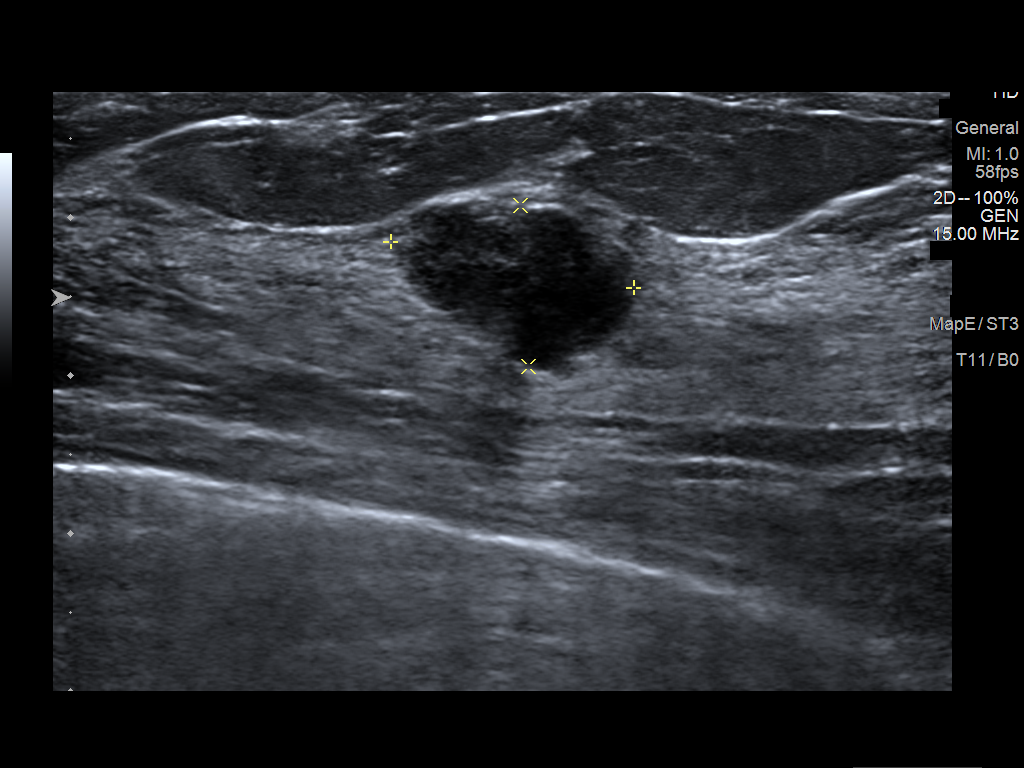
[im 3/5]
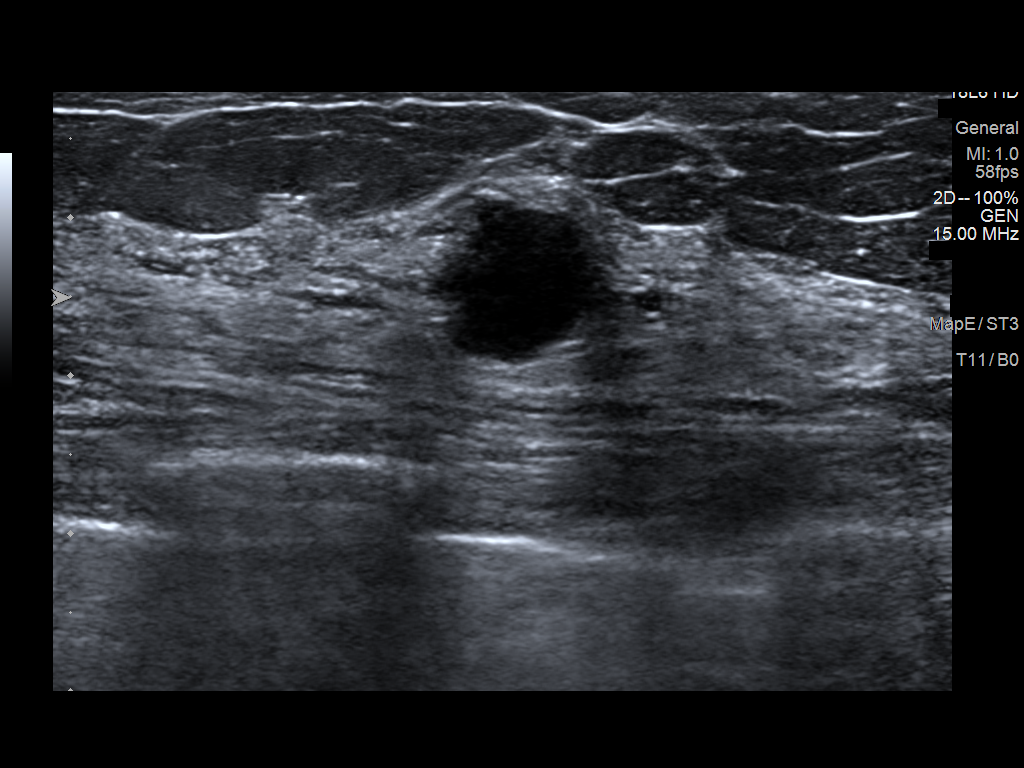
[im 4/5]
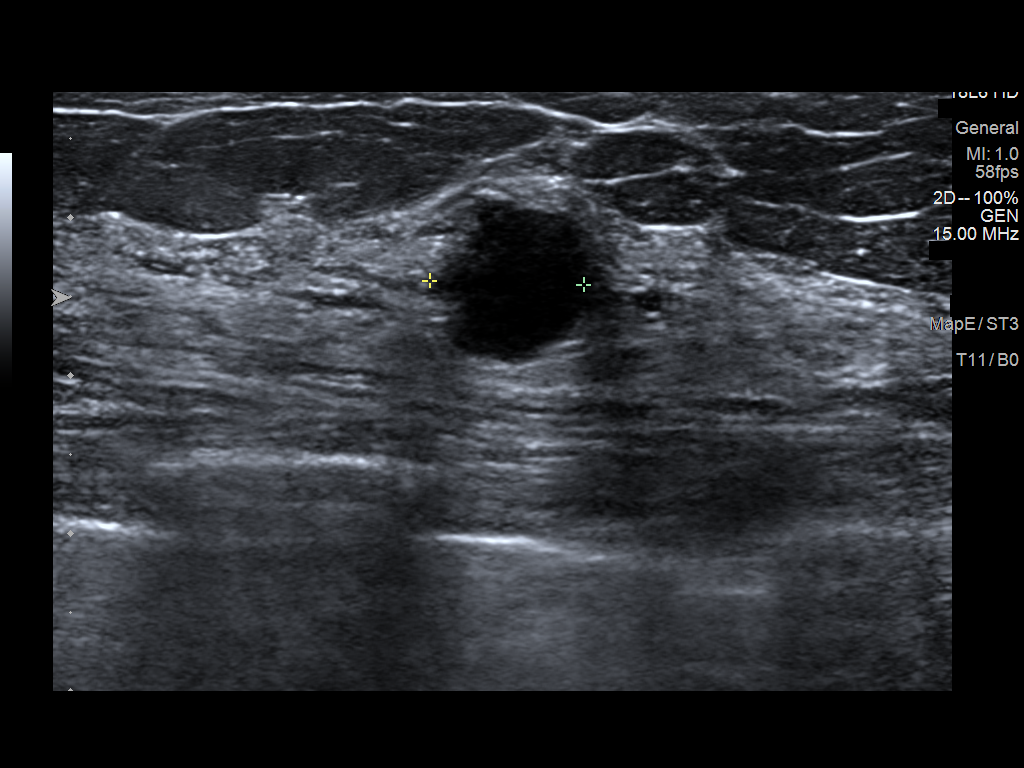
[im 5/5]
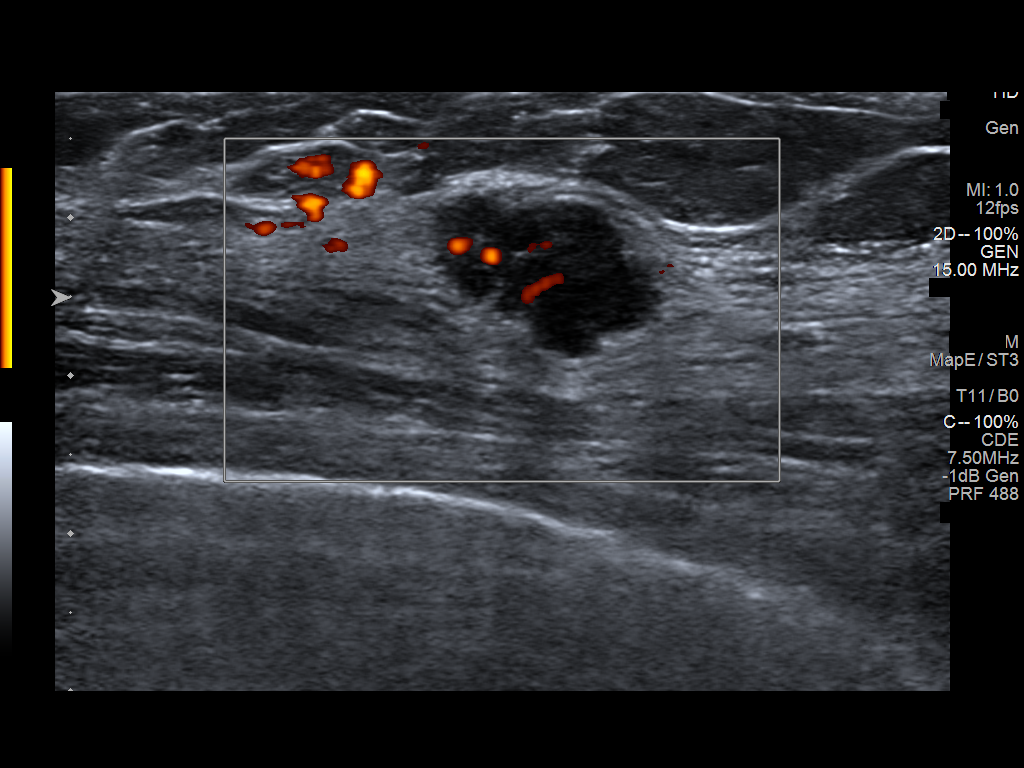

[5 of 5 positions shown; findings below may reference images not displayed]

FINDINGS: Targeted ultrasound is performed, showing an irregular hypoechoic
mass in the right breast at 9 o'clock 7 cm from the nipple measuring
1.6 x 1.0 x 1.0 cm. It is suspicious.
IMPRESSION: Suspicious mass in the 9 o'clock region of the right breast.

RECOMMENDATION:
Ultrasound-guided core biopsy of the mass in the 9 o'clock region of
the right breast is recommended. The biopsy will be performed
dictated separately.

I have discussed the findings and recommendations with the patient.
If applicable, a reminder letter will be sent to the patient
regarding the next appointment.

BI-RADS CATEGORY  4: Suspicious.

## 2020-07-11 IMAGING — US US  BREAST BX W/ LOC DEV 1ST LESION IMG BX SPEC US GUIDE*R*
1 series · 11 of 11 positions shown · non-contrast
Comparison: Previous exam(s).
COMPARISON: Previous exam(s).

Addendum:
CLINICAL DATA: Biopsy proven invasive mammary carcinoma in the 7
o'clock region of the right breast and metastatic axillary
adenopathy. Breast MRI showed additional mass in the 9 o'clock
region of the breast.

EXAM:
ULTRASOUND GUIDED RIGHT BREAST CORE NEEDLE BIOPSY

[Series 1: us breast bx w/ loc dev 1st lesion img bx spec us  · 0.06mm/px · 11 of 11 slices shown]
[im 1/11]
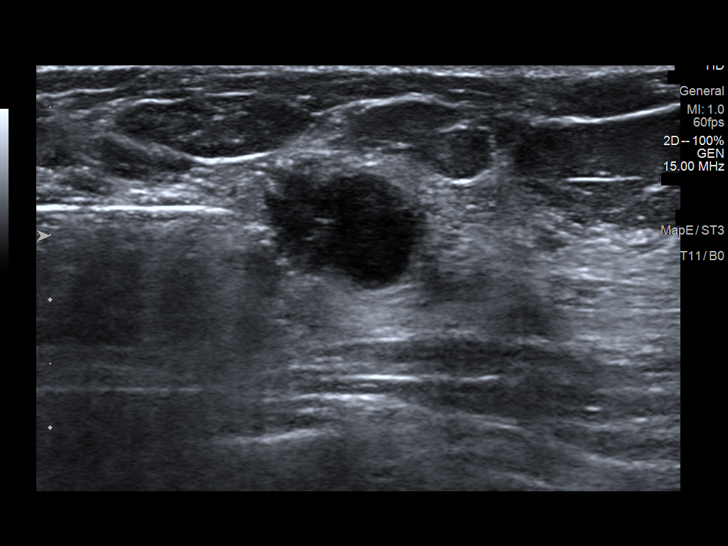
[im 2/11]
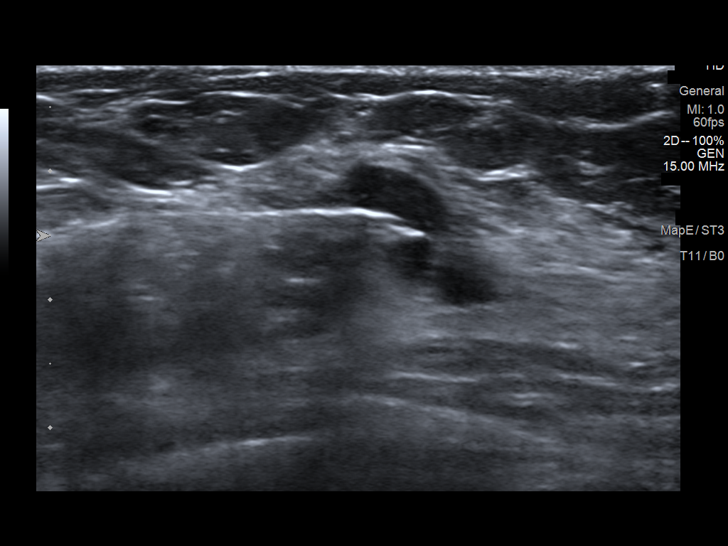
[im 3/11]
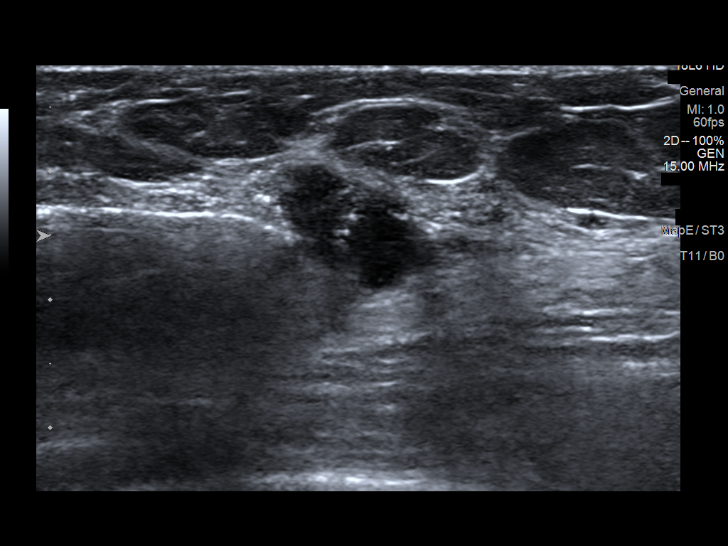
[im 4/11]
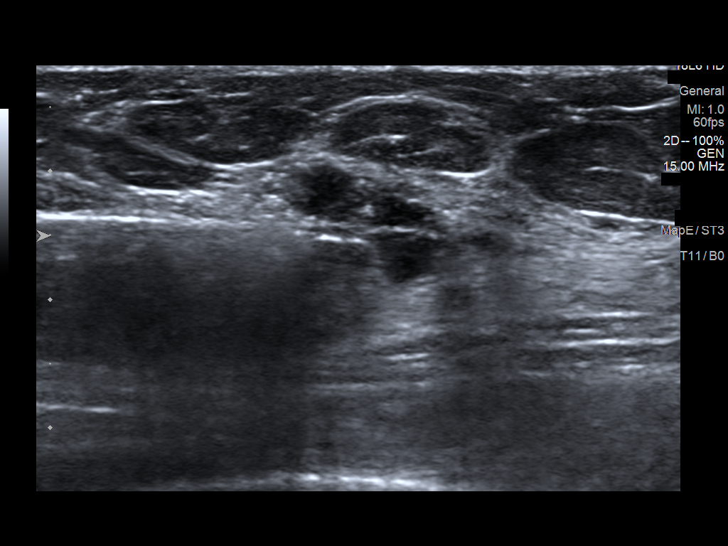
[im 5/11]
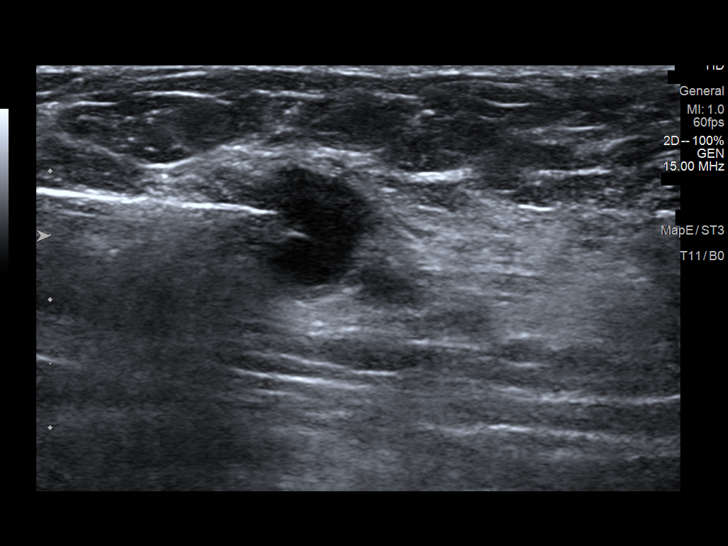
[im 6/11]
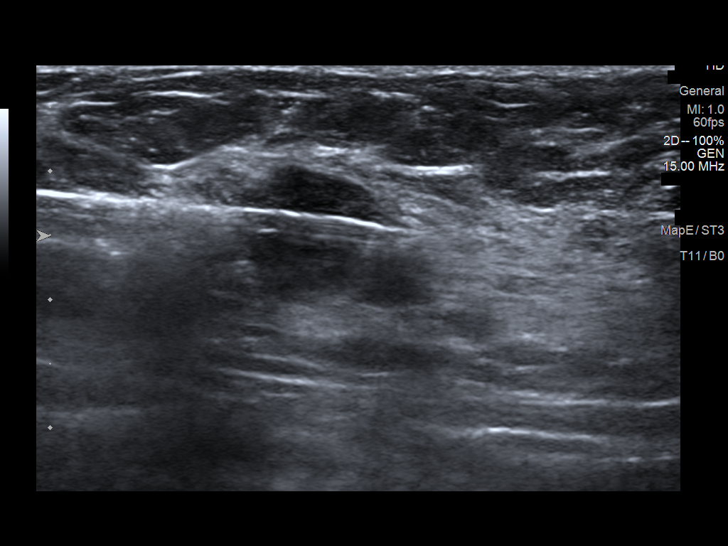
[im 7/11]
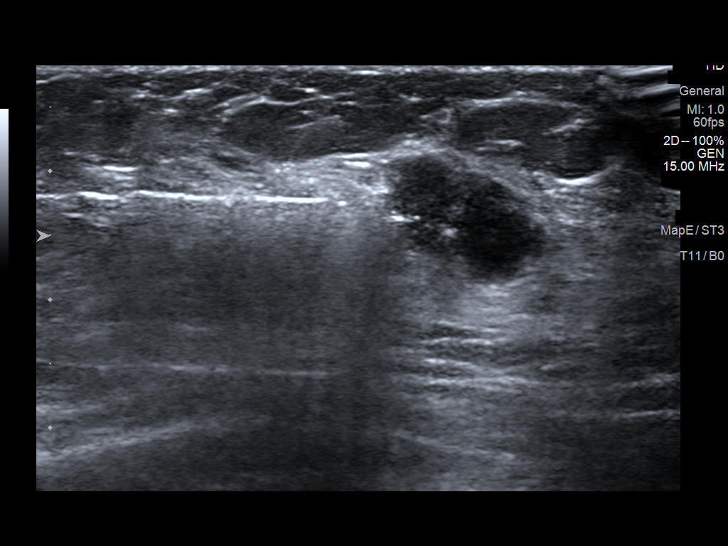
[im 8/11]
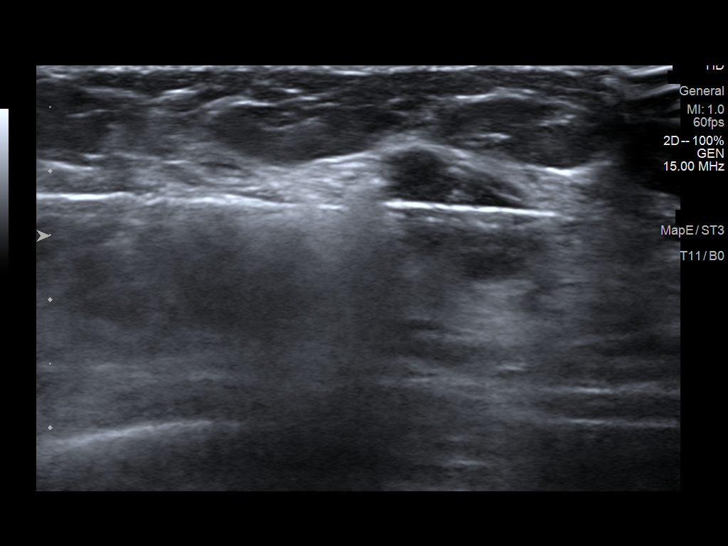
[im 9/11]
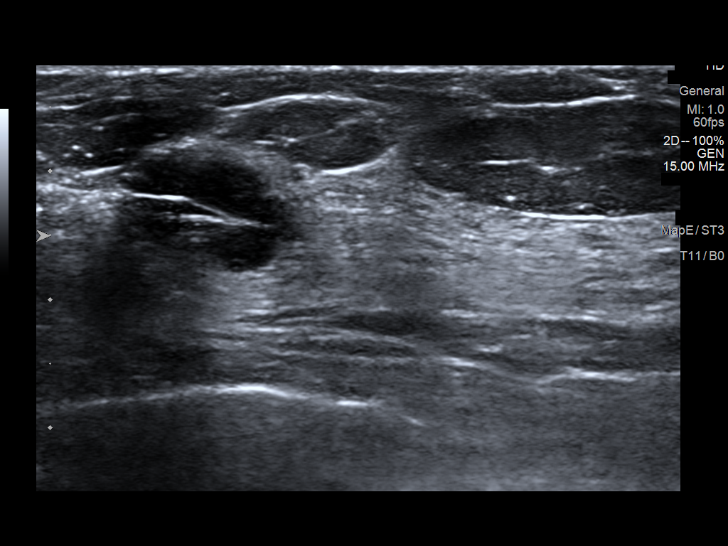
[im 10/11]
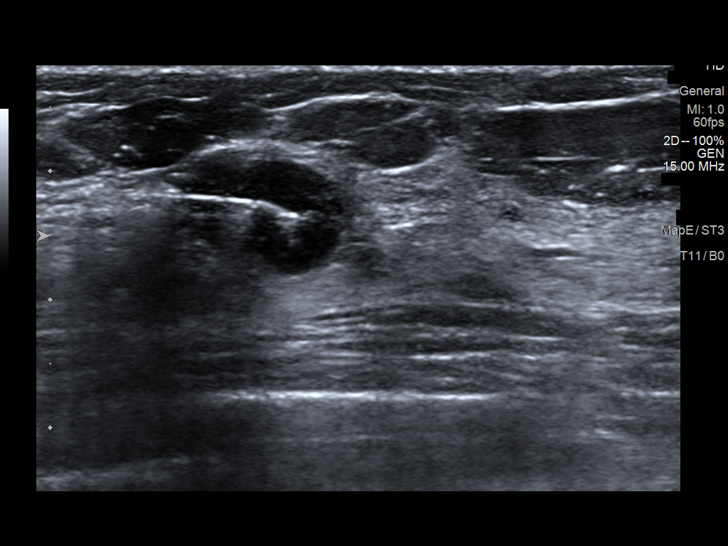
[im 11/11]
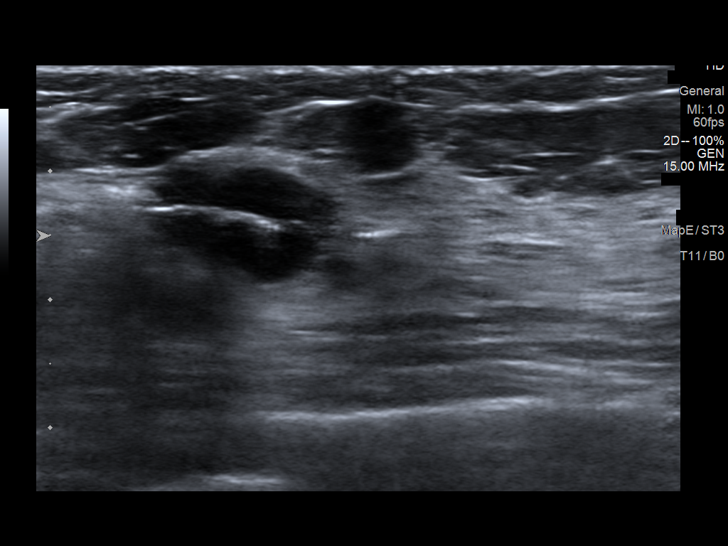

[11 of 11 positions shown; findings below may reference images not displayed]



Lesion quadrant: 9 o'clock

Using sterile technique and 1% lidocaine and 1% lidocaine with
epinephrine as local anesthetic, under direct ultrasound
visualization, a 14 gauge OLIMPIA device was used to perform
biopsy of a mass in the 9 o'clock region of the right breast using
an inferior to superior approach. At the conclusion of the procedure
ribbon shaped tissue marker clip was deployed into the biopsy
cavity. Follow up 2 view mammogram was performed and dictated
separately.
IMPRESSION: Ultrasound guided biopsy of the right breast. No apparent
complications.

ADDENDUM:
Pathology revealed GRADE II INVASIVE MAMMARY CARCINOMA of the RIGHT
breast, 9 o'clock. This was found to be concordant by Dr. OLIMPIA
OLIMPIA.

Pathology results were discussed with the patient by telephone. The
patient reported doing well after the biopsy with tenderness at the
site. Post biopsy instructions and care were reviewed and questions
were answered. The patient was encouraged to call The [REDACTED]

The patient has a recent diagnosis of right breast cancer and should
follow her outlined treatment plan.

Pathology results reported by OLIMPIA RN on [DATE].



Lesion quadrant: 9 o'clock

Using sterile technique and 1% lidocaine and 1% lidocaine with
epinephrine as local anesthetic, under direct ultrasound
visualization, a 14 gauge OLIMPIA device was used to perform
biopsy of a mass in the 9 o'clock region of the right breast using
an inferior to superior approach. At the conclusion of the procedure
ribbon shaped tissue marker clip was deployed into the biopsy
cavity. Follow up 2 view mammogram was performed and dictated
separately.
IMPRESSION: Ultrasound guided biopsy of the right breast. No apparent
complications.

## 2020-07-11 IMAGING — MG MM BREAST LOCALIZATION CLIP
4 series · 4 of 12 positions shown · non-contrast
Comparison: Previous exam(s).

CLINICAL DATA: Status post ultrasound-guided core biopsy of a mass
in region right breast.

EXAM:
3D DIAGNOSTIC RIGHT MAMMOGRAM POST ULTRASOUND BIOPSY

[R LM synth-2D]
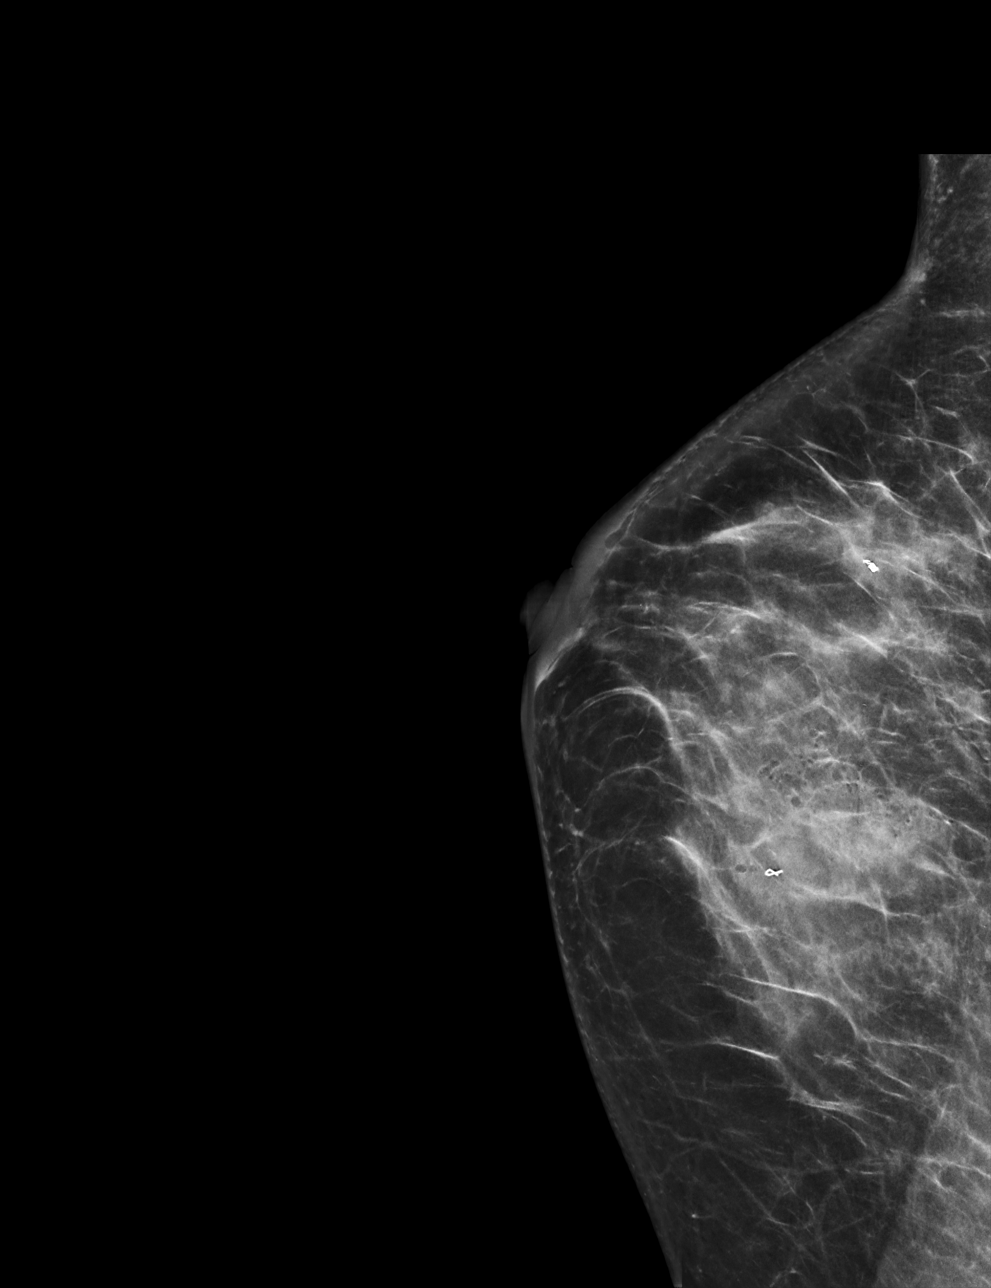

[R CC synth-2D]
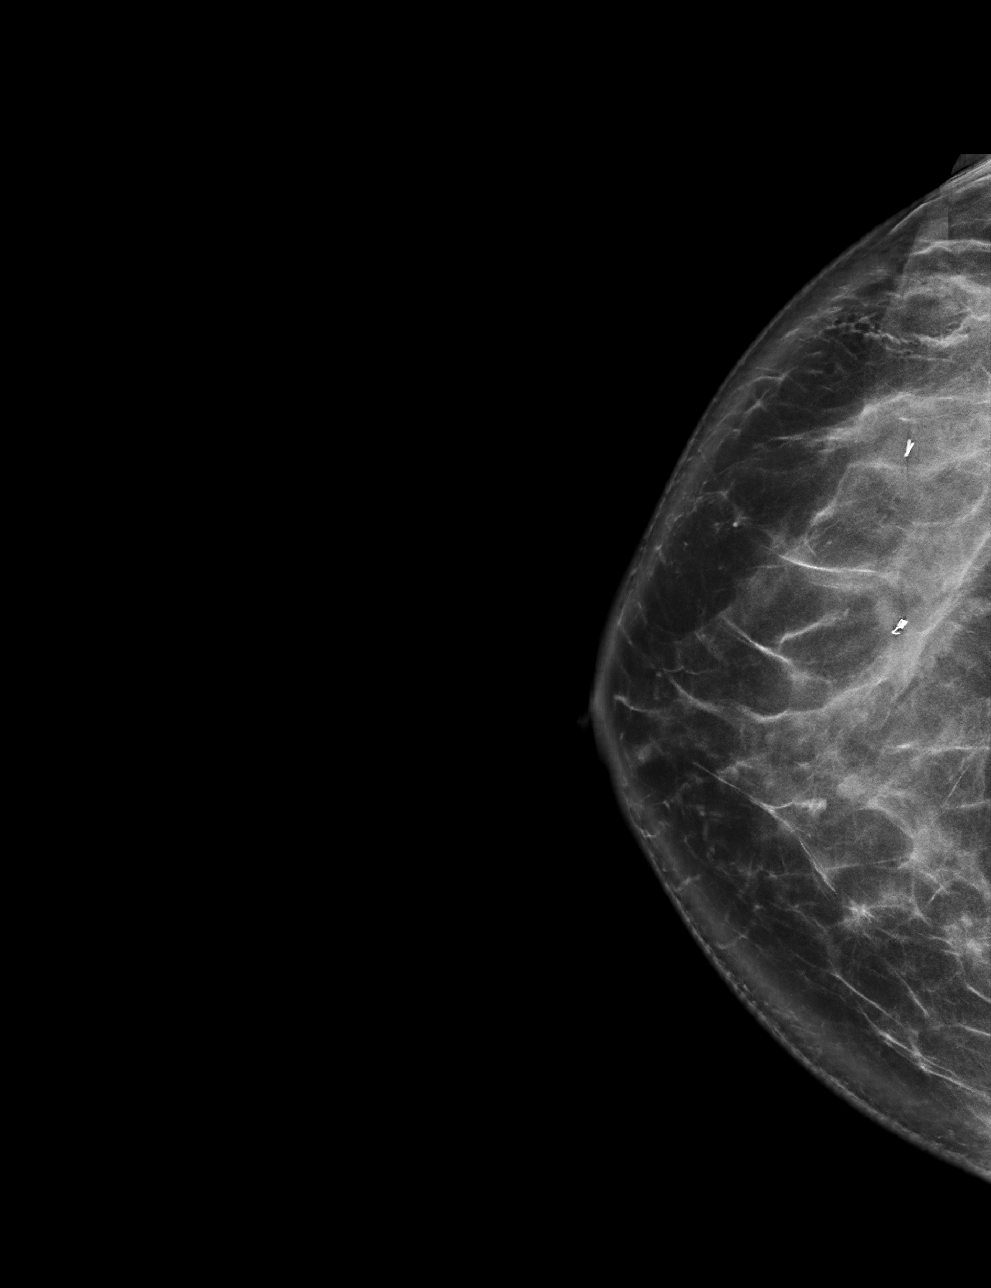

[R LM tomo · tomo slice 37/72.0]
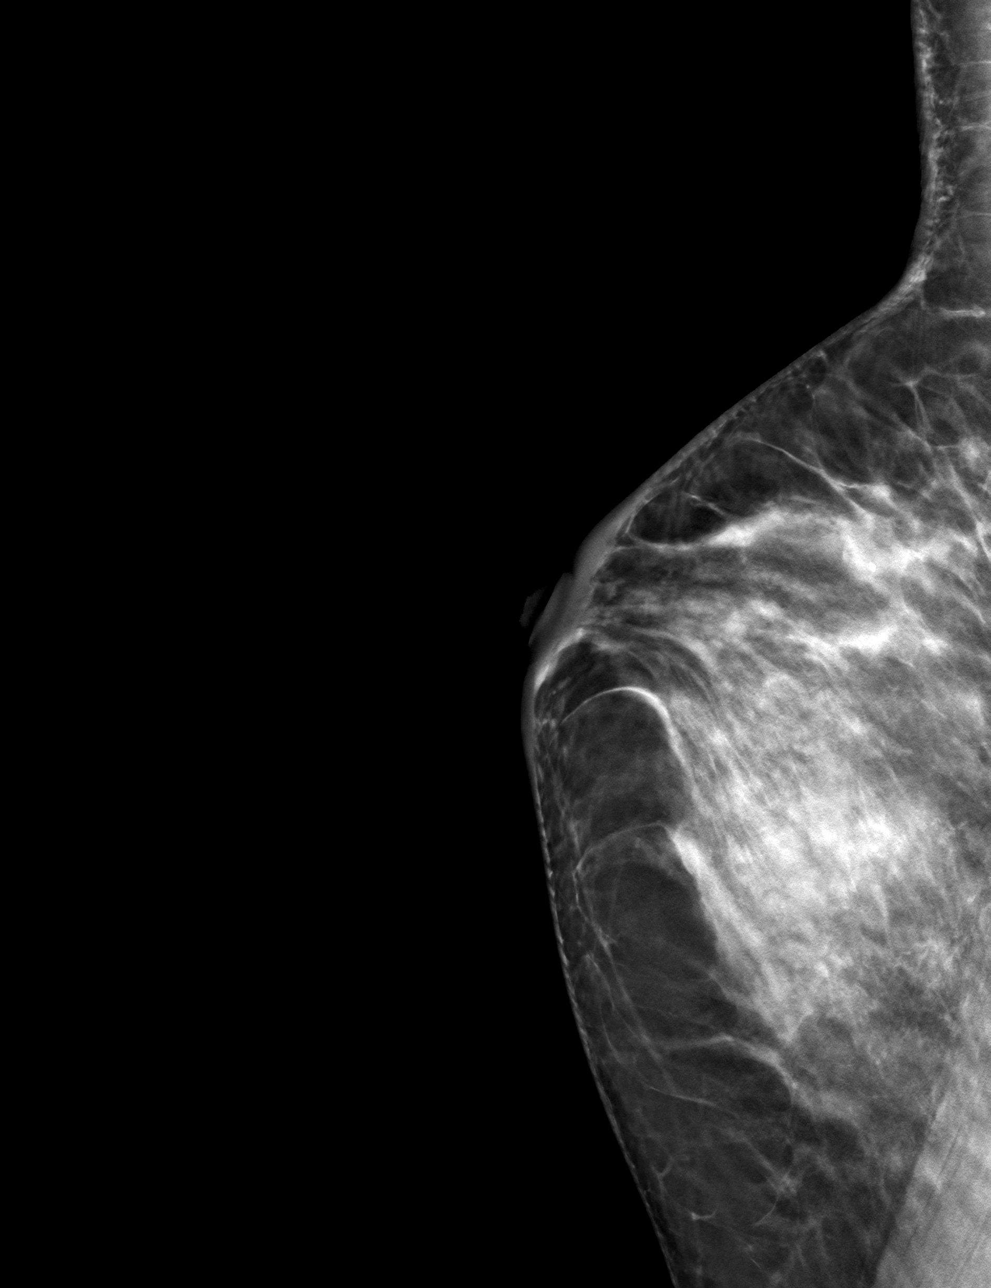

[R CC tomo · tomo slice 43/86.0]
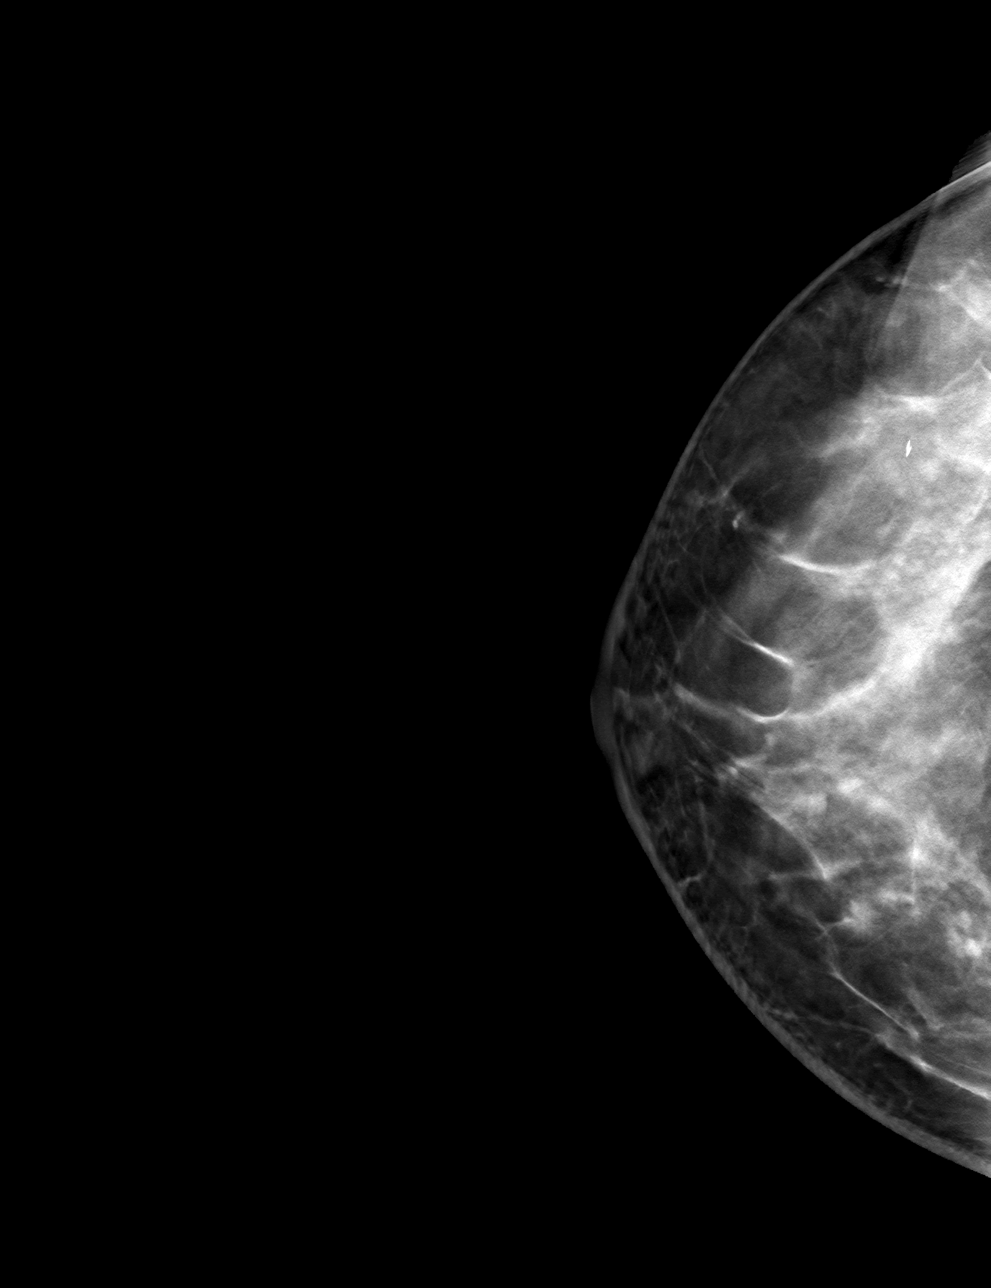

[4 of 12 positions shown; findings below may reference images not displayed]

FINDINGS: 3D Mammographic images were obtained following ultrasound guided
biopsy of a mass in the 9 o'clock region of the right breast. The
biopsy marking clip is in appropriate position in the lateral aspect
of the right breast.
IMPRESSION: Appropriate positioning of the ribbon shaped shaped biopsy marking
clip at the site of biopsy in the lateral aspect of the right breast
(9-10 o'clock).

Final Assessment: Post Procedure Mammograms for Marker Placement

## 2020-07-15 ENCOUNTER — Encounter (HOSPITAL_COMMUNITY): Payer: 59

## 2020-07-15 ENCOUNTER — Other Ambulatory Visit (HOSPITAL_COMMUNITY): Payer: 59

## 2020-07-15 ENCOUNTER — Ambulatory Visit (HOSPITAL_COMMUNITY): Payer: 59

## 2020-07-15 ENCOUNTER — Other Ambulatory Visit: Payer: 59

## 2020-07-15 ENCOUNTER — Encounter: Payer: Self-pay | Admitting: *Deleted

## 2020-07-21 NOTE — Progress Notes (Signed)
Pharmacist Chemotherapy Monitoring - Initial Assessment    Anticipated start date: 07/28/20   Regimen:  . Are orders appropriate based on the patient's diagnosis, regimen, and cycle? Yes . Does the plan date match the patient's scheduled date? Yes . Is the sequencing of drugs appropriate? Yes . Are the premedications appropriate for the patient's regimen? Yes . Prior Authorization for treatment is: Pending o If applicable, is the correct biosimilar selected based on the patient's insurance? yes  Organ Function and Labs: Marland Kitchen Are dose adjustments needed based on the patient's renal function, hepatic function, or hematologic function? No . Are appropriate labs ordered prior to the start of patient's treatment? Yes . Other organ system assessment, if indicated: trastuzumab: Echo/ MUGA and pertuzumab: Echo/ MUGA . The following baseline labs, if indicated, have been ordered: N/A  Dose Assessment: . Are the drug doses appropriate? Yes . Are the following correct: o Drug concentrations Yes o IV fluid compatible with drug Yes o Administration routes Yes o Timing of therapy Yes . If applicable, does the patient have documented access for treatment and/or plans for port-a-cath placement? yes . If applicable, have lifetime cumulative doses been properly documented and assessed? not applicable Lifetime Dose Tracking  No doses have been documented on this patient for the following tracked chemicals: Doxorubicin, Epirubicin, Idarubicin, Daunorubicin, Mitoxantrone, Bleomycin, Oxaliplatin, Carboplatin, Liposomal Doxorubicin  o   Toxicity Monitoring/Prevention: . The patient has the following take home antiemetics prescribed: Ondansetron and Prochlorperazine . The patient has the following take home medications prescribed: N/A . Medication allergies and previous infusion related reactions, if applicable, have been reviewed and addressed. Yes . The patient's current medication list has been assessed  for drug-drug interactions with their chemotherapy regimen. no significant drug-drug interactions were identified on review.  Order Review: . Are the treatment plan orders signed? Yes . Is the patient scheduled to see a provider prior to their treatment? Yes  I verify that I have reviewed each item in the above checklist and answered each question accordingly.  Romualdo Bolk [place], , 07/21/2020  10:49 AM

## 2020-07-23 ENCOUNTER — Encounter (HOSPITAL_COMMUNITY)
Admission: RE | Admit: 2020-07-23 | Discharge: 2020-07-23 | Disposition: A | Discharge status: Home / Self Care | Payer: 59 | Source: Ambulatory Visit | Attending: Oncology | Admitting: Oncology

## 2020-07-23 ENCOUNTER — Encounter (HOSPITAL_COMMUNITY): Payer: Self-pay

## 2020-07-23 ENCOUNTER — Other Ambulatory Visit: Payer: Self-pay

## 2020-07-23 ENCOUNTER — Ambulatory Visit (HOSPITAL_COMMUNITY)
Admission: RE | Admit: 2020-07-23 | Discharge: 2020-07-23 | Disposition: A | Discharge status: Home / Self Care | Payer: 59 | Source: Ambulatory Visit | Attending: Oncology | Admitting: Oncology

## 2020-07-23 DIAGNOSIS — M899 Disorder of bone, unspecified: Secondary | ICD-10-CM | POA: Diagnosis present

## 2020-07-23 DIAGNOSIS — C50511 Malignant neoplasm of lower-outer quadrant of right female breast: Secondary | ICD-10-CM | POA: Diagnosis present

## 2020-07-23 DIAGNOSIS — Z170 Estrogen receptor positive status [ER+]: Secondary | ICD-10-CM | POA: Insufficient documentation

## 2020-07-23 IMAGING — CT CT CHEST W/O CM
2 of 4 series · 15 of 36 positions shown, 18 images · non-contrast
Comparison: No recent comparison.

CLINICAL DATA: Breast cancer staging.

EXAM:
CT CHEST WITHOUT CONTRAST
TECHNIQUE: Multidetector CT imaging of the chest was performed following the
standard protocol without IV contrast.

[Series 2: thorax · axial · 0.72mm/px · z∈[-312,-42]mm · 12 of 159 slices shown, 15 images]
[im 12/159  mediastinal]
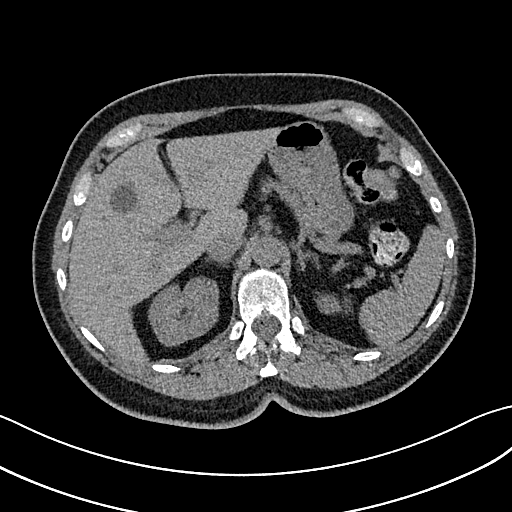
[im 12/159  lung]
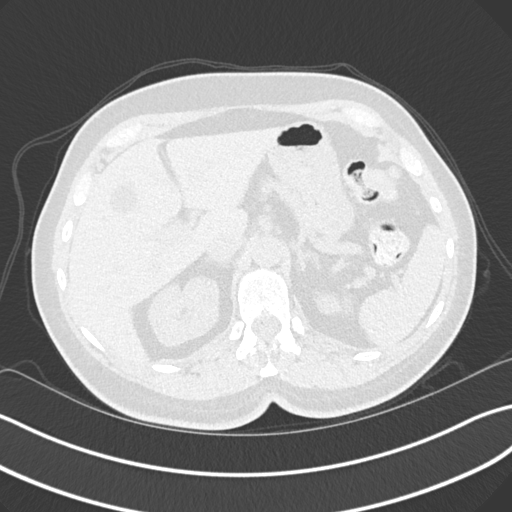
[im 23/159  lung]
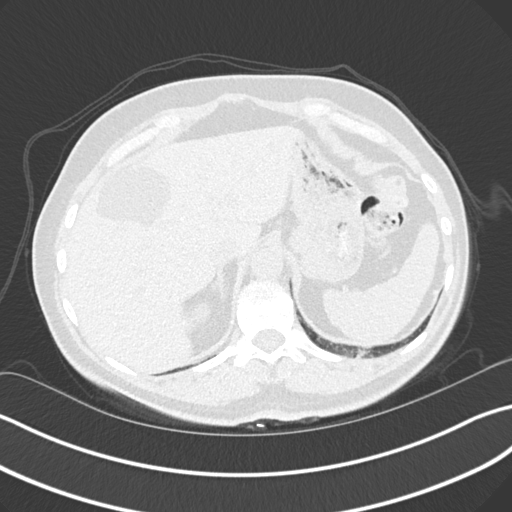
[im 34/159  lung]
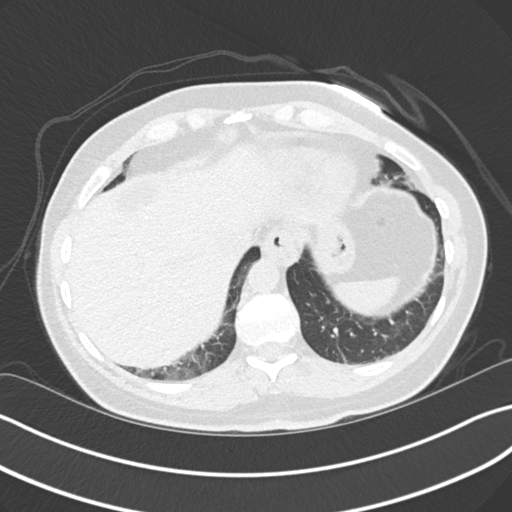
[im 46/159  lung]
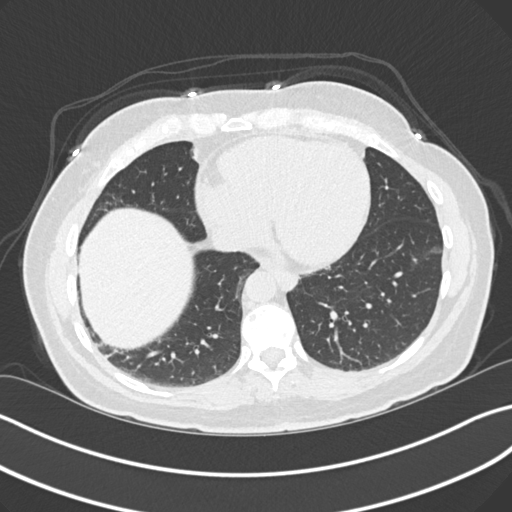
[im 57/159  mediastinal]
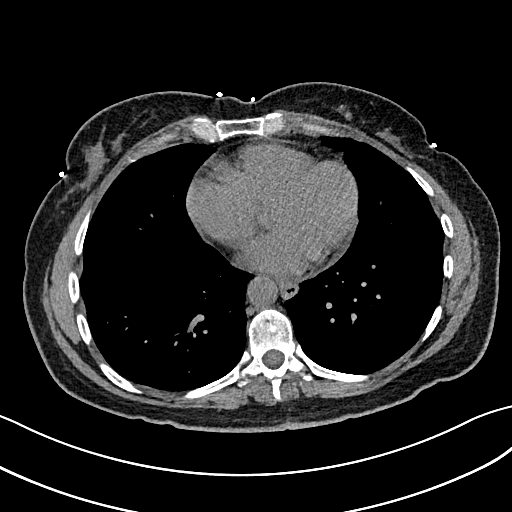
[im 57/159  lung]
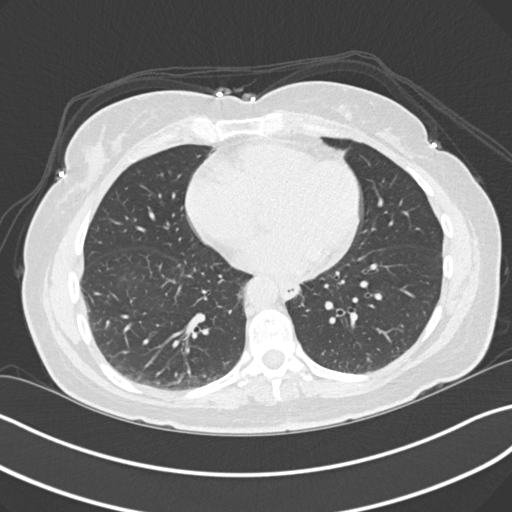
[im 68/159  lung]
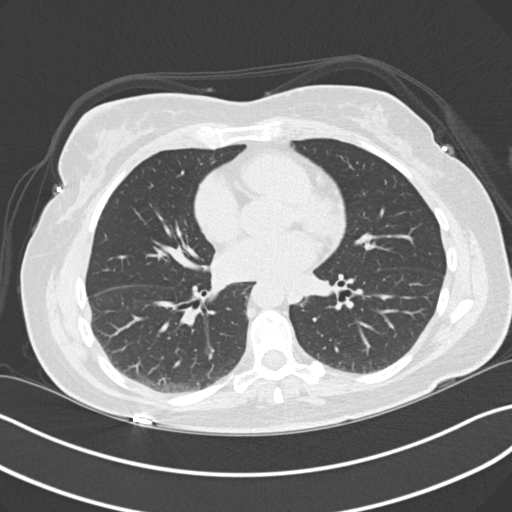
[im 91/159  lung]
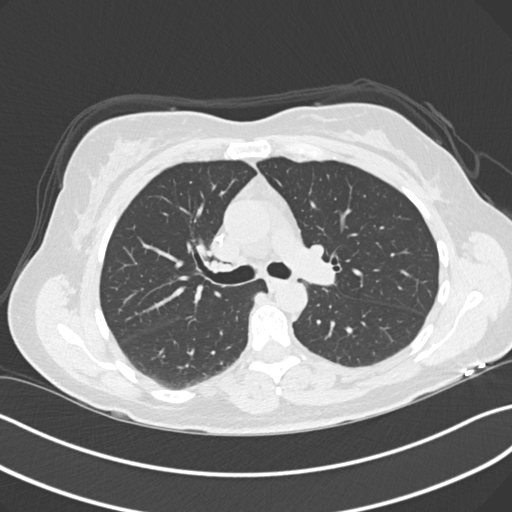
[im 102/159  lung]
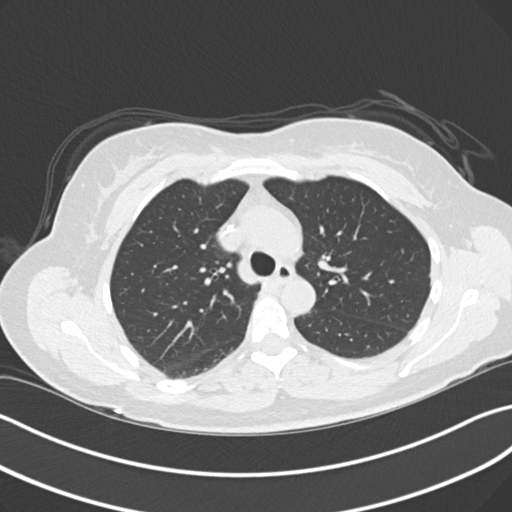
[im 113/159  mediastinal]
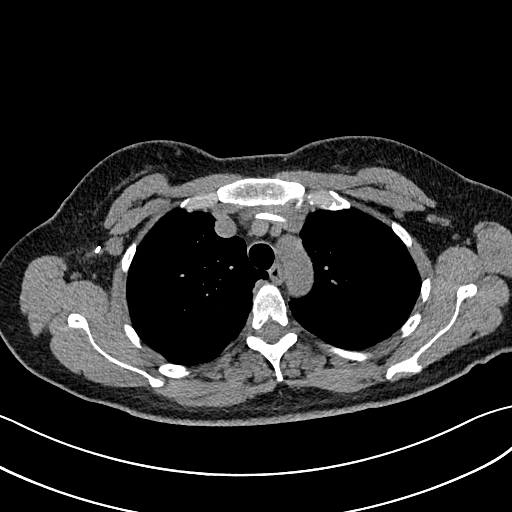
[im 113/159  lung]
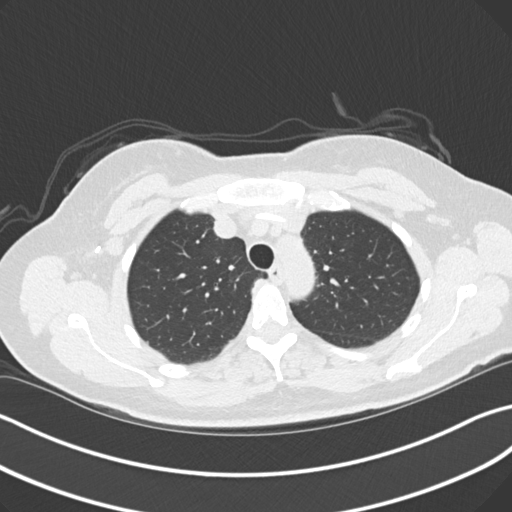
[im 125/159  lung]
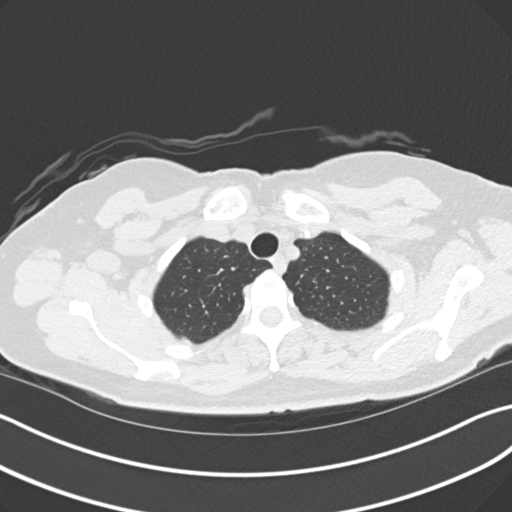
[im 136/159  lung]
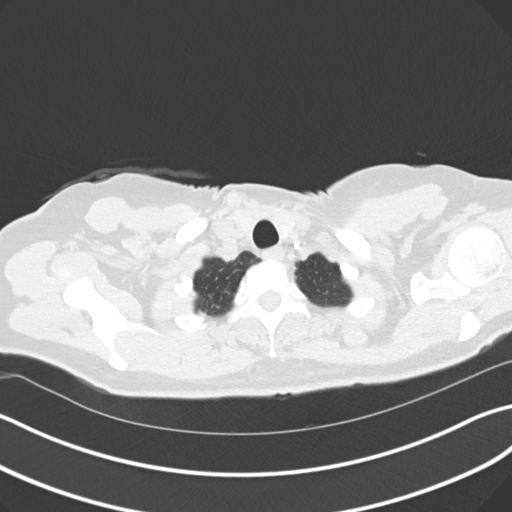
[im 147/159  lung]
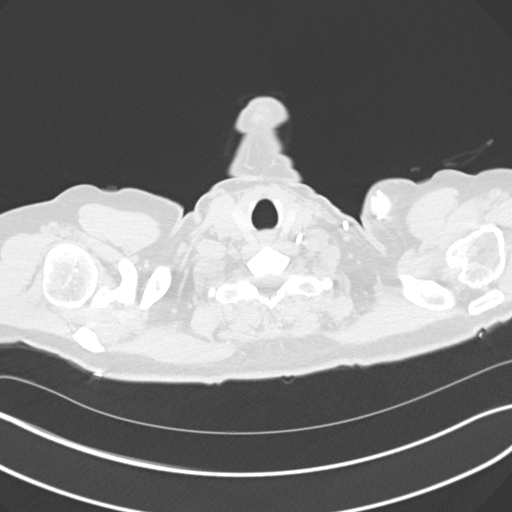

[Series 5: coronal · coronal · 0.63mm/px · 3 of 124 slices shown]
[im 25/124  lung]
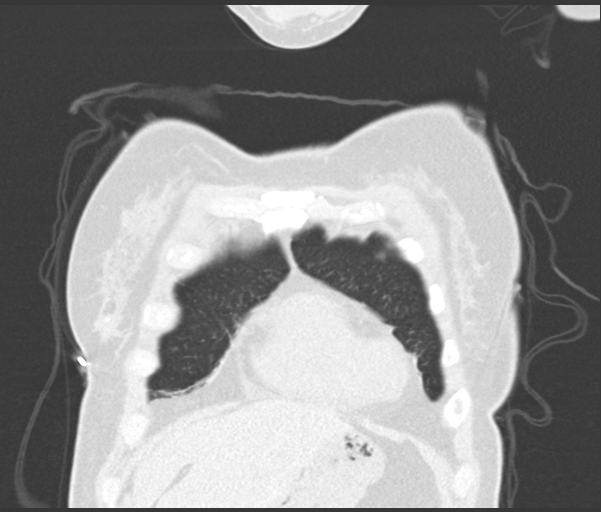
[im 50/124  lung]
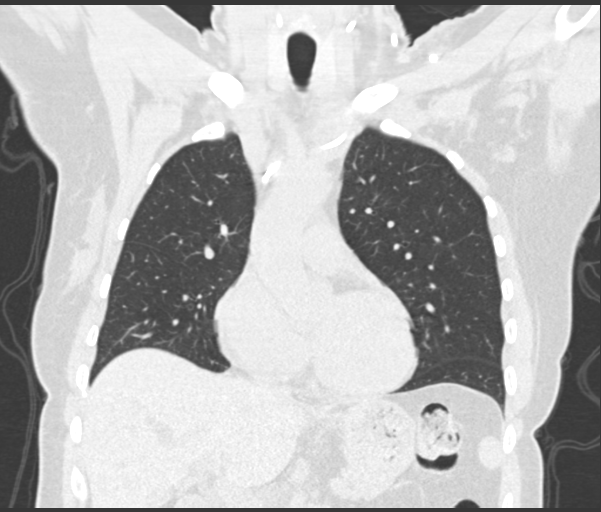
[im 74/124  lung]
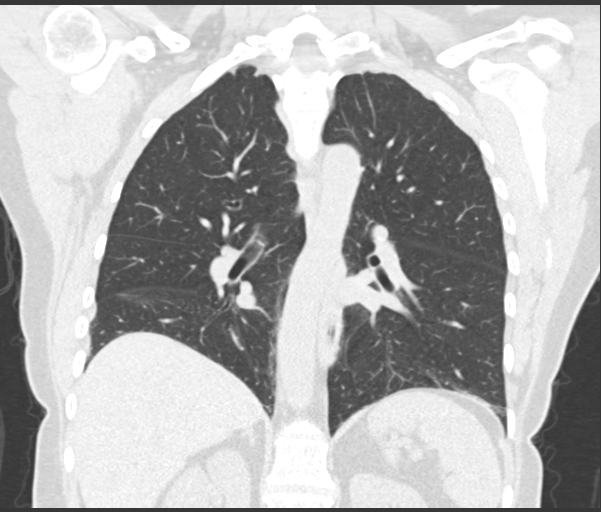

[15 of 36 positions shown; findings below may reference images not displayed]

FINDINGS: Cardiovascular: LEFT-sided Port-A-Cath terminates at the caval to
atrial junction. Heart size is normal without pericardial effusion.

Aortic caliber is normal. Central pulmonary vasculature is normal
caliber. Limited assessment of cardiovascular structures given lack
of intravenous contrast.

Mediastinum/Nodes: Esophagus mildly patulous.  Small hiatal hernia.

No thoracic inlet adenopathy.

RIGHT axillary adenopathy largest measuring approximately 16 mm.

(Image 36/2)

RIGHT retropectoral lymph node along the margin of the pectoralis
minor (image 41/2) 11 mm.

RIGHT axillary lymph node just lateral to the margin of the RIGHT
pectoralis minor (image 47/2) 13 mm. Biopsy clips in the RIGHT
axilla and in the RIGHT breast. No LEFT axillary lymphadenopathy. No
internal mammary nodal enlargement. No mediastinal or gross hilar
lymphadenopathy.

Lungs/Pleura: No consolidation. No pleural effusion. Airways are
patent. No suspicious pulmonary nodules. Basilar atelectasis.

Upper Abdomen: Areas of hypodensity in the liver, largest area
represents a cyst on image 137 of series 2 measuring approximately
4.9 x 3.9 cm.

Small area near the dome of the RIGHT hemi liver (image 122/2) 14 mm
with intermediate density, not compatible with cysts.

Small low-density lesion potentially in hepatic subsegment III
(image 159/2) 10 mm.

Vague area of low density suspected in LEFT hepatic lobe lateral
segment (image 135/2 potentially as large as 2.5 cm though not well
evaluated. Pancreas unremarkable with respect to visualized portions
as is spleen and adrenal glands. Low-density lesion in the
interpolar RIGHT kidney measuring water density likely a small cyst.

Musculoskeletal: Numerous tiny foci of lytic/lucent change (image
84/2) 5 mm in the LEFT lateral aspect of T7.

Mixed lytic and sclerotic focus in the anterior aspect of T10
measures 1.8 by 1.3 cm.

L1 with lytic focus measuring approximately 11 mm (image 68/5)

Lytic focus in LEFT scapula (image 83/5) 6 mm.

Other small scattered lytic foci. Subtle lesions in T4 and T8 as
well.
IMPRESSION: 1. RIGHT axillary and retropectoral adenopathy with evidence of
prior biopsy in the RIGHT breast and RIGHT axilla.
2. Findings of bony metastatic disease and potential hepatic
metastatic disease. PET scan may be helpful for further assessment.

## 2020-07-23 IMAGING — NM NM BONE WHOLE BODY
2 series · 2 of 2 positions shown · non-contrast
Comparison: None

Correlation: CT chest [DATE], MR breast [DATE]

CLINICAL DATA: Breast cancer staging, sternal bone lesion on recent
MR

EXAM:
NUCLEAR MEDICINE WHOLE BODY BONE SCAN
TECHNIQUE: Whole body anterior and posterior images were obtained approximately
3 hours after intravenous injection of radiopharmaceutical.
RADIOPHARMACEUTICALS:  21.0 mCi [8M] MDP IV

[Series 1: whole body · 2.66mm/px · 1 of 1 slices shown (1 of 2)]
[im 1/1]
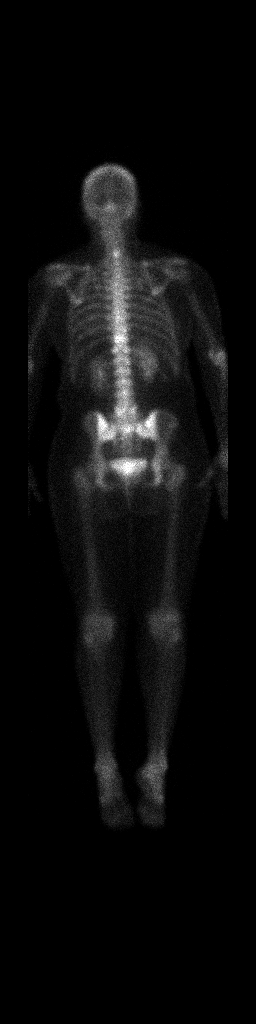

[Series 1: whole body · 2.66mm/px · 1 of 1 slices shown (2 of 2)]
[im 1/1]
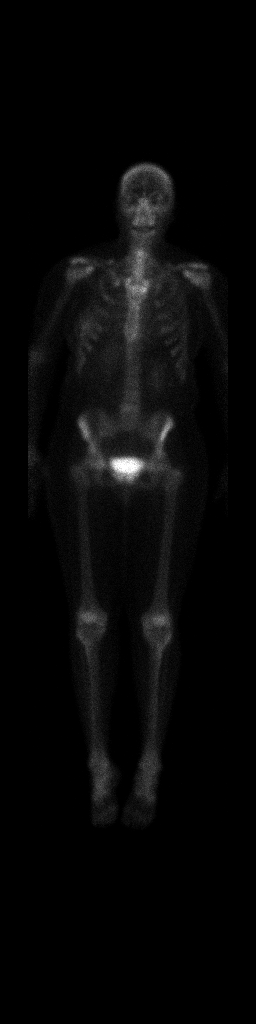

[2 of 2 positions shown; findings below may reference images not displayed]

FINDINGS: Foci of abnormal tracer uptake are seen within the T10 vertebral
body, T8 vertebral body, at midline of lower cervical spine, at
superior LEFT manubrium, and the posterior LEFT ninth rib.

The sites are suspicious for osseous metastases.

The T8 and T10 lesions correspond to abnormalities on CT.

The manubrial lesion corresponds to the abnormality seen on MR.

Questionable tiny focus of sclerosis in the posterior LEFT ninth rib
on CT.

No additional sites of worrisome osseous tracer accumulation are
identified.

Expected urinary tract and soft tissue distribution of tracer.
IMPRESSION: Abnormal foci of uptake within T8, T10, midline lower cervical
spine, superior LEFT manubrium and posterior LEFT ninth rib
suspicious for osseous metastases as above.

## 2020-07-23 MED ORDER — TECHNETIUM TC 99M MEDRONATE IV KIT
21.0000 | PACK | Freq: Once | INTRAVENOUS | Status: AC
  Administered 2020-07-23: 21 via INTRAVENOUS

## 2020-07-23 NOTE — Progress Notes (Signed)
The following biosimilar Kanjinti (trastuzumab-anns) has been selected for use in this patient.  Kennith Center, Pharm.D., CPP 07/23/2020@4 :05 PM

## 2020-07-23 NOTE — Progress Notes (Signed)
The following biosimilar Ziextenzo (pegfilgrastim-bmez) has been selected for use in this patient.  Kennith Center, Pharm.D., CPP 07/23/2020@3 :49 PM

## 2020-07-23 NOTE — Progress Notes (Signed)
[place]  [phone] [phone]     ID: Kim Archer DOB: 05/05/1965  MR#: [id number]  GLO#:[id number]  Patient Care Team: Kim Queen, MD as PCP - General (Obstetrics and Gynecology) Archer, Kim Dad, MD as Consulting Physician (Oncology) Kim Kussmaul, MD as Consulting Physician (General Surgery) Kim Queen, MD as Consulting Physician (Obstetrics and Gynecology) Kim Kaufmann, RN as Oncology Nurse Navigator Kim Germany, RN as Oncology Nurse Navigator Kim Kussmaul, MD as Consulting Physician (General Surgery) Archer, Kim Dad, MD as Consulting Physician (Oncology) Kim Pray, MD as Consulting Physician (Radiation Oncology) Kim Dock, NP OTHER MD:  CHIEF COMPLAINT: Triple positive breast cancer  CURRENT TREATMENT: Neoadjuvant chemotherapy.   INTERVAL HISTORY: Kim Archer returns today for follow up of her triple positive breast cancer. She is due to begin treatment with neoadjuvant chemotherapy today.  This will consist of Docetaxel, Carboplatin, trastuzumab and Pertuzumab given on day 1 of a 21 day cycle with Neulasta or biosimilar given on day 3.    Her most recent echocardiogram was completed on 07/03/2020 showing and ejection fraction of 55-60%.  Kim Archer underwent CT chest yesterday.  This showed an indeterminate liver lesion and concern for lytic lesions in the bone.  Her bone scan is pending.  She notes that she is unsure what to do about her chemotherapy, because she is unsure what to expect.  She has picked up her anti nausea medications and understands how to take them.  She is due to start treatment on Monday, 07/28/2020.    REVIEW OF SYSTEMS: Review of Systems  Constitutional:  Negative for appetite change, chills, fatigue, fever and unexpected weight change.  HENT:   Negative for hearing loss, lump/mass and trouble swallowing.   Eyes:  Negative for eye problems and icterus.  Respiratory:   Negative for chest tightness, cough and shortness of breath.   Cardiovascular:  Negative for chest pain, leg swelling and palpitations.  Gastrointestinal:  Negative for abdominal distention, abdominal pain, constipation, diarrhea, nausea and vomiting.  Endocrine: Negative for hot flashes.  Genitourinary:  Negative for difficulty urinating.   Musculoskeletal:  Negative for arthralgias.  Skin:  Negative for itching and rash.  Neurological:  Negative for dizziness, extremity weakness, headaches and numbness.  Hematological:  Negative for adenopathy. Does not bruise/bleed easily.  Psychiatric/Behavioral:  Negative for depression. The patient is not nervous/anxious.       COVID 19 VACCINATION STATUS: not vaccinated; infection 10/2019   HISTORY OF CURRENT ILLNESS: From the original intake note:  Kim Archer "Kim Archer" [facility]) was previously seen in the high risk breast cancer clinic on 05/11/2016. She had undergone left lumpectomy on 03/22/2016 showing atypical lobular hyperplasia. She was prescribed tamoxifen at that time, which she believes she took for two years. [She underwent hysterectomy with bilateral salpingo-oophorectomy 03/28/2019, pathology showing leiomyomas and adenomyosis, and may have stopped the tamoxifen around that time].  More recently, she had routine screening mammography on 06/11/2020 showing an area of asymmetry in the right breast. She underwent right diagnostic mammography with tomography and right breast ultrasonography at [facility] on 06/20/2020 showing: breast density category C; 2.4 cm right breast mass at 7 o'clock; one abnormal right axillary lymph node measuring 1.5 cm.  Accordingly on 06/25/2020 she proceeded to biopsy of the right breast area in question. The pathology from this procedure ([id number]) showed: invasive mammary carcinoma, e-cadherin positive, grade 2 or 3. Prognostic indicators significant for: estrogen receptor, 40% positive and progesterone  receptor, >  95% positive, both with strong staining intensity. Proliferation marker Ki67 at 40%. HER2 positive by immunohistochemistry (3+).  Biopsy of the right axillary lymph node performed at the same time showed mammary carcinoma, with no residual lymph node tissue present.  Cancer Staging Malignant neoplasm of lower-outer quadrant of right breast of female, estrogen receptor positive ([place]) Staging form: Breast, AJCC 8th Edition - Clinical stage from 07/02/2020: Stage IB (cT2, cN1, cM0, G3, ER+, PR+, HER2+) - Signed by Chauncey Cruel, MD on 07/02/2020 Stage prefix: Initial diagnosis Histologic grading system: 3 grade system Laterality: Right Staged by: Pathologist and managing physician Stage used in treatment planning: Yes National guidelines used in treatment planning: Yes Type of national guideline used in treatment planning: NCCN  The patient's subsequent history is as detailed below.   PAST MEDICAL HISTORY: Past Medical History:  Diagnosis Date   Allergy    Anemia    due to heavy periods   Atypical ductal hyperplasia of left breast    History of kidney stones    HSV-2 (herpes simplex virus 2) infection    PONV (postoperative nausea and vomiting)    right breast ca 06/2020   Breast cancer    PAST SURGICAL HISTORY: Past Surgical History:  Procedure Laterality Date   BREAST CYST EXCISION Left 03/22/2016   ALH-BENIGN   BREAST LUMPECTOMY WITH RADIOACTIVE SEED LOCALIZATION Left 03/22/2016   Procedure: LEFT BREAST LUMPECTOMY WITH RADIOACTIVE SEED LOCALIZATION;  Surgeon: Kim Messing III, MD;  Location: [facility];  Service: General;  Laterality: Left;   CESAREAN SECTION     x3   COMBINED HYSTEROSCOPY DIAGNOSTIC / D&C  07/17/2018   EYE SURGERY     lt lens replacement   PORTACATH PLACEMENT Left 07/07/2020   Procedure: INSERTION PORT-A-CATH;  Surgeon: Kim Kussmaul, MD;  Location: [place];  Service: General;  Laterality: Left;   TUBAL LIGATION      FAMILY  HISTORY: Family History  Problem Relation Age of Onset   Breast cancer Mother    Melanoma Father    Leukemia Sister    Liver cancer Brother        stomach   Breast cancer Maternal Aunt    Colon cancer Neg Hx    Colon polyps Neg Hx    Esophageal cancer Neg Hx    Rectal cancer Neg Hx    Stomach cancer Neg Hx   The patient's father died at age 75. He had a history of melanoma. He has a brother, the patient's uncle, with prostate cancer. The patient's mother died in her 6s from urosepsis. She had a history of breast cancer diagnosed in her 22s. The patient has a brother, with cerebral palsy, who had "stomach cancer" metastatic to the liver. The patient has 2 sisters. One of them died from childhood leukemia.    GYNECOLOGIC HISTORY:  No LMP recorded (lmp unknown). Patient has had a hysterectomy. Menarche: 55 years old Age at first live birth: 55 years old [place] P 3 LMP 03/2019, with hysterectomy Contraceptive: used remotely for many years with no complications HRT never used  Hysterectomy? Yes, 03/2019 (path in Epic) BSO? 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 Her husband [name] (goes by "[name]") is Airline pilot for [employer], which manages apartment complexes. The patient's daughter Kearney Hard, age 40, works as a Theme park manager in [place]. She has 3 children. The patient's daughter Moody Bruins, age  58, lives in [place]. She, her husband, and their three children live with [place] and Fred. The patient's third child, Quillian Quince "Dalbert Mayotte, 56 years old, also lives in [place] and works in Architect. Patient has in total 8 grandchildren. She is a Psychologist, forensic.    ADVANCED DIRECTIVES: In the absence of any documentation to the contrary, the patient's spouse is their HCPOA.    HEALTH MAINTENANCE: Social History   Tobacco Use   Smoking status: Never   Smokeless tobacco: Never   Vaping Use   Vaping Use: Never used  Substance Use Topics   Alcohol use: No   Drug use: No     Colonoscopy: 08/2018 (Dr. Ardis Hughs), recall 2030  PAP: 05/2020  Bone density: 05/2019 (Dr. Christen Butter office)   Allergies  Allergen Reactions   Ciprofloxacin Other (See Comments)    Patient came into office with complaints of difficulty swallowing.  Ate at [employer] prior and took Cipro prior   Sulfa Antibiotics Rash    Current Outpatient Medications  Medication Sig Dispense Refill   Boron 3 MG CAPS Take by mouth.     Calcium 500 MG tablet 1,000 mg.     Diethylpropion HCl CR 75 MG TB24 diethylpropion ER 75 mg tablet,extended release  TAKE 1 TABLET BY MOUTH EVERY DAY     levocetirizine (XYZAL) 5 MG tablet Take 5 mg by mouth every evening.     lidocaine-prilocaine (EMLA) cream Apply to affected area once 30 g 3   loratadine (CLARITIN) 10 MG tablet Take 10 mg by mouth daily.     LORazepam (ATIVAN) 0.5 MG tablet Take 1 tablet (0.5 mg total) by mouth every 8 (eight) hours as needed for anxiety (May increase to 2 tablets if needed). (Patient not taking: Reported on 07/24/2020) 30 tablet 1   LUTEIN PO Take by mouth.     MAGNESIUM PO Take 1 tablet by mouth at bedtime.     Menatetrenone (VITAMIN K2) 100 MCG TABS Take by mouth.     Multiple Vitamin (MULTIVITAMIN WITH MINERALS) TABS tablet Take 1 tablet by mouth daily.     VITAMIN D PO Vitamin D     zinc sulfate 220 (50 Zn) MG capsule Take 220 mg by mouth daily.     dexamethasone (DECADRON) 4 MG tablet Take 2 tablets (8 mg total) by mouth 2 (two) times daily. Start the day before Taxotere. Then take daily x 3 days after chemotherapy. (Patient not taking: Reported on 07/24/2020) 30 tablet 1   HYDROcodone-acetaminophen (NORCO/VICODIN) 5-325 MG tablet Take 1-2 tablets by mouth every 6 (six) hours as needed for moderate pain or severe pain. (Patient not taking: Reported on 07/24/2020) 10 tablet 0   prochlorperazine (COMPAZINE) 10 MG tablet Take 1 tablet (10 mg  total) by mouth every 6 (six) hours as needed (Nausea or vomiting). (Patient not taking: Reported on 07/24/2020) 30 tablet 1   Ubiquinol 50 MG CAPS Take by mouth daily.     valACYclovir (VALTREX) 500 MG tablet  (Patient not taking: Reported on 07/24/2020)  12   No current facility-administered medications for this visit.    OBJECTIVE:   Vitals:   07/24/20 0915  BP: 119/64  Pulse: 66  Resp: 20  Temp: 97.7 F (36.5 C)  SpO2: 99%     Body mass index is 26.81 kg/m.   Wt Readings from Last 3 Encounters:  07/24/20 171 lb 3.2 oz (77.7 kg)  07/02/20 173 lb 3.2 oz (78.6 kg)  08/29/18 175 lb (79.4 kg)  ECOG FS:1 - Symptomatic but completely ambulatory GENERAL: Patient is a well appearing female in no acute distress HEENT:  Sclerae anicteric.  Oropharynx clear and moist. No ulcerations or evidence of oropharyngeal candidiasis. Neck is supple.  NODES:  No cervical, supraclavicular, or axillary lymphadenopathy palpated.  BREAST EXAM:  Deferred. LUNGS:  Clear to auscultation bilaterally.  No wheezes or rhonchi. HEART:  Regular rate and rhythm. No murmur appreciated. ABDOMEN:  Soft, nontender.  Positive, normoactive bowel sounds. No organomegaly palpated. MSK:  No focal spinal tenderness to palpation. Full range of motion bilaterally in the upper extremities. EXTREMITIES:  No peripheral edema.   SKIN:  Clear with no obvious rashes or skin changes. No nail dyscrasia. NEURO:  Nonfocal. Well oriented.  Appropriate affect.    LAB RESULTS:  CMP     Component Value Date/Time   NA 145 07/24/2020 0849   NA 142 05/11/2016 1508   K 3.7 07/24/2020 0849   K 4.3 05/11/2016 1508   CL 107 07/24/2020 0849   CO2 28 07/24/2020 0849   CO2 26 05/11/2016 1508   GLUCOSE 74 07/24/2020 0849   GLUCOSE 90 05/11/2016 1508   BUN 17 07/24/2020 0849   BUN 10.9 05/11/2016 1508   CREATININE 0.88 07/24/2020 0849   CREATININE 0.87 07/02/2020 0826   CREATININE 1.0 05/11/2016 1508   CALCIUM 9.7 07/24/2020  0849   CALCIUM 9.5 05/11/2016 1508   PROT 7.4 07/24/2020 0849   PROT 7.0 05/11/2016 1508   ALBUMIN 4.1 07/24/2020 0849   ALBUMIN 3.9 05/11/2016 1508   AST 21 07/24/2020 0849   AST 22 07/02/2020 0826   AST 19 05/11/2016 1508   ALT 20 07/24/2020 0849   ALT 22 07/02/2020 0826   ALT 23 05/11/2016 1508   ALKPHOS 110 07/24/2020 0849   ALKPHOS 98 05/11/2016 1508   BILITOT 0.5 07/24/2020 0849   BILITOT 0.5 07/02/2020 0826   BILITOT 0.36 05/11/2016 1508   GFRNONAA >60 07/24/2020 0849   GFRNONAA >60 07/02/2020 0826    No results found for: TOTALPROTELP, ALBUMINELP, A1GS, A2GS, BETS, BETA2SER, GAMS, MSPIKE, SPEI  Lab Results  Component Value Date   WBC 5.8 07/24/2020   NEUTROABS 3.1 07/24/2020   HGB 13.6 07/24/2020   HCT 41.7 07/24/2020   MCV 94.1 07/24/2020   PLT 202 07/24/2020    No results found for: LABCA2  No components found for: [id number]  No results for input(s): INR in the last 168 hours.  No results found for: LABCA2  No results found for: HUT654  No results found for: YTK354  No results found for: SFK812  No results found for: CA2729  No components found for: HGQUANT  No results found for: CEA1 / No results found for: CEA1   No results found for: AFPTUMOR  No results found for: CHROMOGRNA  No results found for: KPAFRELGTCHN, LAMBDASER, KAPLAMBRATIO (kappa/lambda light chains)  No results found for: HGBA, HGBA2QUANT, HGBFQUANT, HGBSQUAN (Hemoglobinopathy evaluation)   No results found for: LDH  No results found for: IRON, TIBC, IRONPCTSAT (Iron and TIBC)  No results found for: FERRITIN  Urinalysis No results found for: COLORURINE, APPEARANCEUR, LABSPEC, PHURINE, GLUCOSEU, HGBUR, BILIRUBINUR, KETONESUR, PROTEINUR, UROBILINOGEN, NITRITE, LEUKOCYTESUR   STUDIES:    ELIGIBLE FOR AVAILABLE RESEARCH PROTOCOL: No  ASSESSMENT: 55 y.o. [place], [place] woman  (1) status post left lumpectomy 03/22/2016 showing atypical lobular hyperplasia  (2)  prophylactic tamoxifen taken from 04/2016 through 03/2019  (a) status post hysterectomy with bilateral salpingo-oophorectomy February 2021  (3) genetic testing 05/22/2019  through the my risk hereditary cancer panel offered by [facility] found no deleterious mutations in BRCA, TP53, PALB2 or any of the other genes tested  (a) variants of uncertain significance were found in BRIP1 and NBN  RIGHT BREAST CANCER (4) right breast lower outer quadrant biopsy 06/25/2020 shows a clinical T2 N1, stage IB invasive ductal carcinoma, grade 2 or 3, triple positive, with an MIB-1 of 40%.  (5) neoadjuvant chemotherapy will consist of carboplatin, docetaxel, trastuzumab and pertuzumab every 21 days x 6 starting July 28, 2020  (6) anti-HER2 treatment to be continued to complete 1 year  (a) echocardiogram on 07/03/2020 shows EF of 55-60%  (7) definitive surgery to follow  (8) adjuvant radiation  (9) anastrozole to start at the completion of local treatment   PLAN:  Anaiza is here for f/u and discussion of her upcoming chemotherapy regimen with Docetaxel , Carboplatin, Trastuzumab, and Pertuzumab beginning 07/28/2020.  Her scans are indeterminate and we need to further evaluate the liver, and await her bone scan results.  Armentha is tearful about these results and that they may mean that she could have stage IV breast cancer.  I talked with Tionna about needing to get further information, and we discussed that her treatment would continue as planned.    I placed orders for her to undergo MRI liver to better characterize the indeterminate lesions and get a baseline, and we will await the bone scan results.  She understands this.    Carmell Austria and I discussed her chemotherapy regimen and her anti nausea medicaitons.  I suggested that she keep a journal of the side effects that she experiences during the next week.  I let Manila know that when Dr. [name] returns from out of town I will review her scans with him.   She understands this.    She knows to call for any questions that may arise between now and her next appointment.  We are happy to see her sooner if needed.   Total encounter time 50 minutes* in radiology review, face to face visit time, order entry and documentation of the encounter.  Wilber Bihari, NP 07/24/20 10:13 PM Medical Oncology and Hematology [facility] [place], [place] [address] Tel. [phone]    Fax. [phone]  *Total Encounter Time as defined by the Centers for Medicare and Medicaid Services includes, in addition to the face-to-face time of a patient visit (documented in the note above) non-face-to-face time: obtaining and reviewing outside history, ordering and reviewing medications, tests or procedures, care coordination (communications with other health care professionals or caregivers) and documentation in the medical record.

## 2020-07-24 ENCOUNTER — Other Ambulatory Visit: Payer: 59

## 2020-07-24 ENCOUNTER — Ambulatory Visit: Payer: 59 | Admitting: Adult Health

## 2020-07-24 ENCOUNTER — Inpatient Hospital Stay (HOSPITAL_BASED_OUTPATIENT_CLINIC_OR_DEPARTMENT_OTHER): Payer: 59 | Admitting: Adult Health

## 2020-07-24 ENCOUNTER — Inpatient Hospital Stay: Payer: 59 | Attending: Oncology

## 2020-07-24 ENCOUNTER — Encounter: Payer: Self-pay | Admitting: *Deleted

## 2020-07-24 ENCOUNTER — Encounter: Payer: Self-pay | Admitting: Adult Health

## 2020-07-24 VITALS — BP 119/64 | HR 66 | Temp 97.7°F | Resp 20 | Wt 171.2 lb

## 2020-07-24 DIAGNOSIS — C7951 Secondary malignant neoplasm of bone: Secondary | ICD-10-CM | POA: Insufficient documentation

## 2020-07-24 DIAGNOSIS — Z5111 Encounter for antineoplastic chemotherapy: Secondary | ICD-10-CM | POA: Insufficient documentation

## 2020-07-24 DIAGNOSIS — Z5112 Encounter for antineoplastic immunotherapy: Secondary | ICD-10-CM | POA: Diagnosis not present

## 2020-07-24 DIAGNOSIS — C773 Secondary and unspecified malignant neoplasm of axilla and upper limb lymph nodes: Secondary | ICD-10-CM | POA: Diagnosis not present

## 2020-07-24 DIAGNOSIS — K769 Liver disease, unspecified: Secondary | ICD-10-CM | POA: Diagnosis not present

## 2020-07-24 DIAGNOSIS — C50511 Malignant neoplasm of lower-outer quadrant of right female breast: Secondary | ICD-10-CM | POA: Insufficient documentation

## 2020-07-24 DIAGNOSIS — Z170 Estrogen receptor positive status [ER+]: Secondary | ICD-10-CM

## 2020-07-24 DIAGNOSIS — Z79899 Other long term (current) drug therapy: Secondary | ICD-10-CM | POA: Insufficient documentation

## 2020-07-24 DIAGNOSIS — C7931 Secondary malignant neoplasm of brain: Secondary | ICD-10-CM | POA: Diagnosis not present

## 2020-07-24 LAB — COMPREHENSIVE METABOLIC PANEL
ALT: 20 U/L (ref 0–44)
AST: 21 U/L (ref 15–41)
Albumin: 4.1 g/dL (ref 3.5–5.0)
Alkaline Phosphatase: 110 U/L (ref 38–126)
Anion gap: 10 (ref 5–15)
BUN: 17 mg/dL (ref 6–20)
CO2: 28 mmol/L (ref 22–32)
Calcium: 9.7 mg/dL (ref 8.9–10.3)
Chloride: 107 mmol/L (ref 98–111)
Creatinine, Ser: 0.88 mg/dL (ref 0.44–1.00)
GFR, Estimated: >60 mL/min (ref >60)
Glucose, Bld: 74 mg/dL (ref 70–99)
Potassium: 3.7 mmol/L (ref 3.5–5.1)
Sodium: 145 mmol/L (ref 135–145)
Total Bilirubin: 0.5 mg/dL (ref 0.3–1.2)
Total Protein: 7.4 g/dL (ref 6.5–8.1)

## 2020-07-24 LAB — CBC WITH DIFFERENTIAL/PLATELET
Abs Immature Granulocytes: 0.01 10*3/uL (ref 0.00–0.07)
Basophils Absolute: 0 10*3/uL (ref 0.0–0.1)
Basophils Relative: 1 %
Eosinophils Absolute: 0.2 10*3/uL (ref 0.0–0.5)
Eosinophils Relative: 4 %
HCT: 41.7 % (ref 36.0–46.0)
Hemoglobin: 13.6 g/dL (ref 12.0–15.0)
Immature Granulocytes: 0 %
Lymphocytes Relative: 35 %
Lymphs Abs: 2 10*3/uL (ref 0.7–4.0)
MCH: 30.7 pg (ref 26.0–34.0)
MCHC: 32.6 g/dL (ref 30.0–36.0)
MCV: 94.1 fL (ref 80.0–100.0)
Monocytes Absolute: 0.4 10*3/uL (ref 0.1–1.0)
Monocytes Relative: 7 %
Neutro Abs: 3.1 10*3/uL (ref 1.7–7.7)
Neutrophils Relative %: 53 %
Platelets: 202 10*3/uL (ref 150–400)
RBC: 4.43 MIL/uL (ref 3.87–5.11)
RDW: 12.5 % (ref 11.5–15.5)
WBC: 5.8 10*3/uL (ref 4.0–10.5)
nRBC: 0 % (ref 0.0–0.2)

## 2020-07-26 ENCOUNTER — Ambulatory Visit (HOSPITAL_COMMUNITY)
Admission: RE | Admit: 2020-07-26 | Discharge: 2020-07-26 | Disposition: A | Discharge status: Home / Self Care | Payer: 59 | Source: Ambulatory Visit | Attending: Adult Health | Admitting: Adult Health

## 2020-07-26 ENCOUNTER — Other Ambulatory Visit: Payer: Self-pay

## 2020-07-26 ENCOUNTER — Ambulatory Visit: Payer: 59

## 2020-07-26 DIAGNOSIS — C50511 Malignant neoplasm of lower-outer quadrant of right female breast: Secondary | ICD-10-CM | POA: Diagnosis present

## 2020-07-26 DIAGNOSIS — Z170 Estrogen receptor positive status [ER+]: Secondary | ICD-10-CM | POA: Insufficient documentation

## 2020-07-26 DIAGNOSIS — K769 Liver disease, unspecified: Secondary | ICD-10-CM | POA: Insufficient documentation

## 2020-07-26 IMAGING — MR MR ABDOMEN WO/W CM
18 series · 48 of 48 positions shown · IV contrast (gadavist)
Comparison: Chest CT and whole-body bone scan [DATE].

CLINICAL DATA: Breast cancer. Indeterminate liver lesion on chest
CT.

EXAM:
MRI ABDOMEN WITHOUT AND WITH CONTRAST
TECHNIQUE: Multiplanar multisequence MR imaging of the abdomen was performed
both before and after the administration of intravenous contrast.
CONTRAST:  7.5mL GADAVIST GADOBUTROL 1 MMOL/ML IV SOLN

[Series 3: T2 · coronal · 6.0mm · 1.56mm/px · 2 of 30 slices shown (1 of 2)]
[im 1/30]
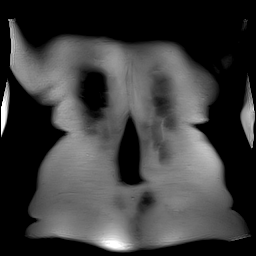
[im 30/30]
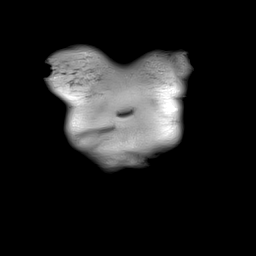

[Series 5: T2 fat-sat · axial · 6.0mm · 1.25mm/px · z∈[-89,+134]mm · 2 of 32 slices shown]
[im 1/32]
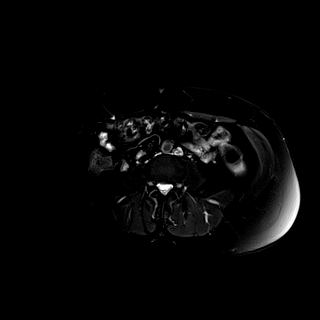
[im 32/32]
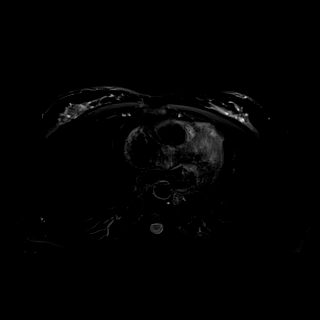

[Series 6: T1 · axial · 3.0mm · 1.25mm/px · z∈[-110,+127]mm · 4 of 80 slices shown (1 of 2)]
[im 1/80]
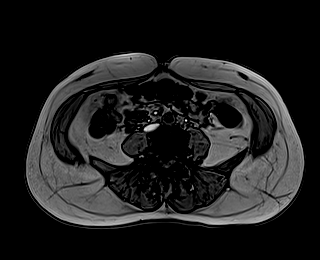
[im 27/80]
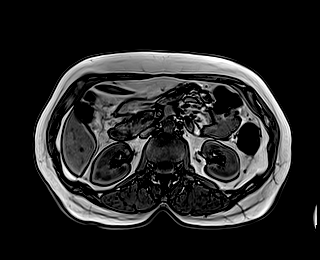
[im 53/80]
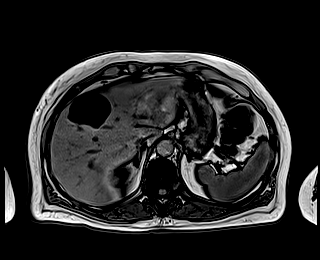
[im 80/80]
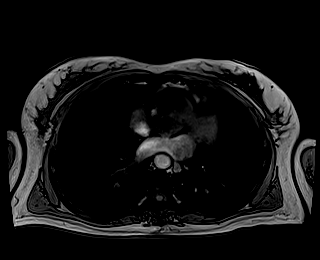

[Series 7: T1 · axial · 3.0mm · 1.25mm/px · z∈[-110,+127]mm · 3 of 80 slices shown (2 of 2)]
[im 1/80]
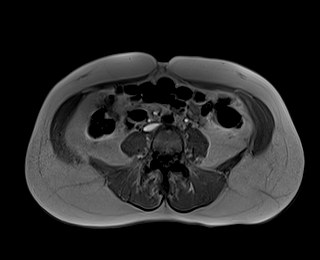
[im 40/80]
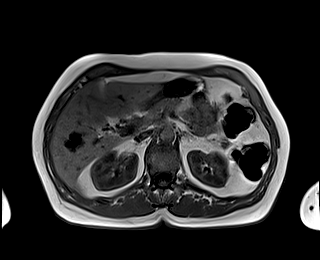
[im 80/80]
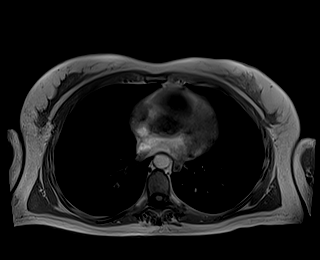

[Series 8: DWI · axial · 6.0mm · 1.49mm/px · z∈[-122,+130]mm · 3 of 72 slices shown (1 of 2)]
[im 1/72]
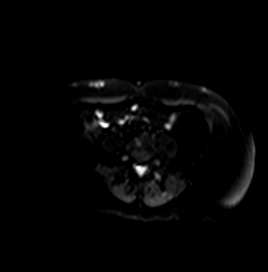
[im 36/72]
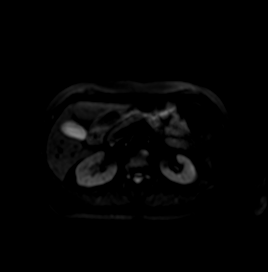
[im 72/72]
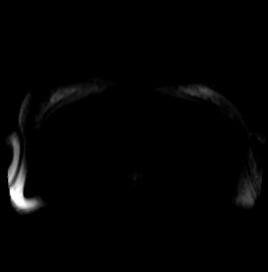

[Series 9: DWI · axial · 6.0mm · 1.49mm/px · 1 of 36 slices shown (2 of 2)]
[im 1/36]
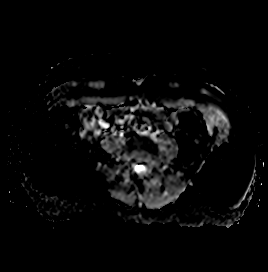

[Series 10: bSSFP · axial · 4.0mm · 0.84mm/px · z∈[-110,+126]mm · 2 of 60 slices shown]
[im 1/60]
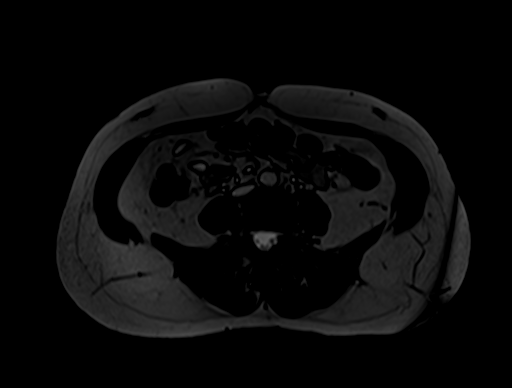
[im 60/60]
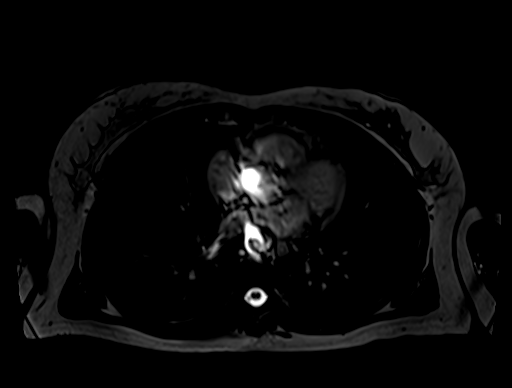

[Series 12: T1 dynamic · axial · 3.0mm · 1.25mm/px · z∈[-110,+127]mm · 3 of 80 slices shown (1 of 10)]
[im 1/80]
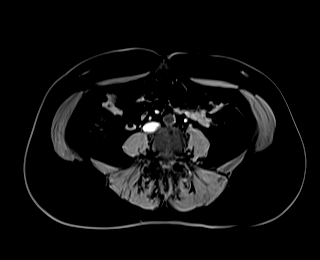
[im 40/80]
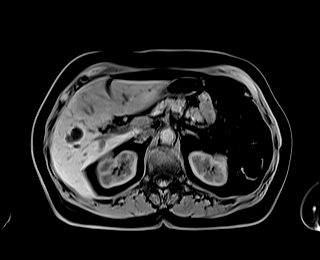
[im 80/80]
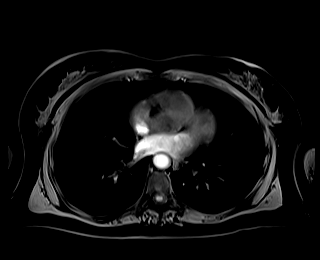

[Series 16: T1 dynamic · axial · 3.0mm · 1.25mm/px · z∈[-110,+127]mm · 3 of 80 slices shown (2 of 10)]
[im 1/80]
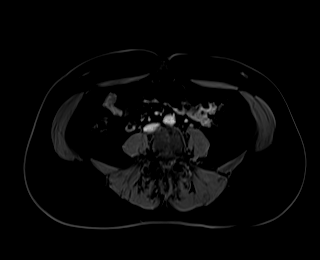
[im 40/80]
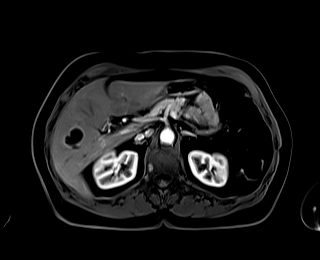
[im 80/80]
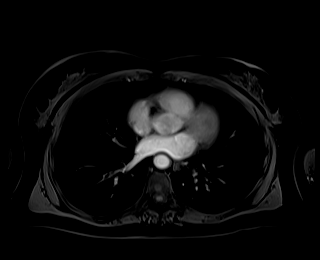

[Series 17: T1 dynamic · axial · 3.0mm · 1.25mm/px · z∈[-110,+127]mm · 3 of 80 slices shown (3 of 10)]
[im 1/80]
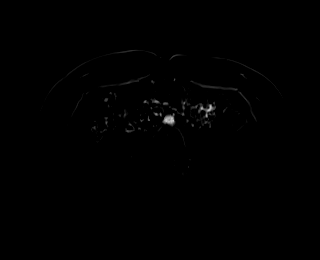
[im 40/80]
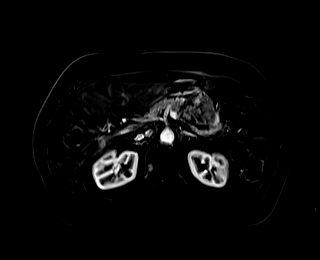
[im 80/80]
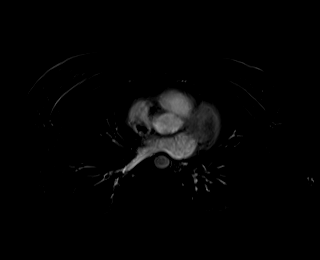

[Series 20: T1 dynamic · axial · 3.0mm · 1.25mm/px · z∈[-110,+127]mm · 3 of 80 slices shown (4 of 10)]
[im 1/80]
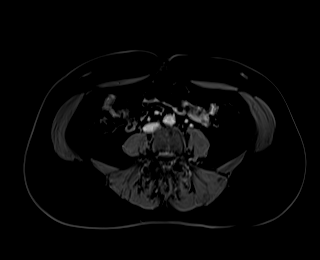
[im 40/80]
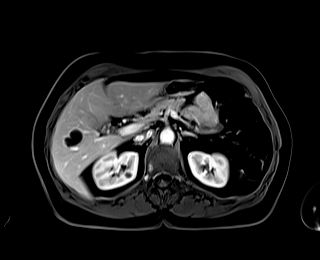
[im 80/80]
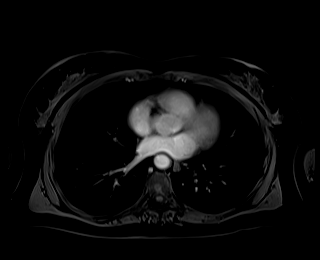

[Series 21: T1 dynamic · axial · 3.0mm · 1.25mm/px · z∈[-110,+127]mm · 3 of 80 slices shown (5 of 10)]
[im 1/80]
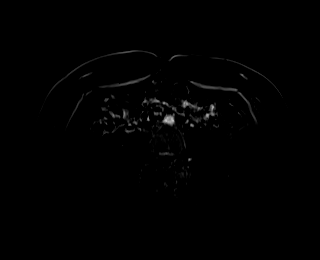
[im 40/80]
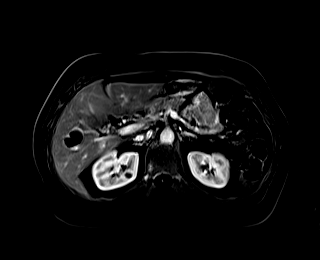
[im 80/80]
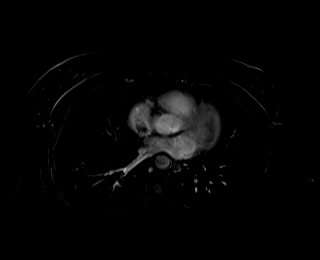

[Series 24: T1 dynamic · axial · 3.0mm · 1.25mm/px · z∈[-110,+127]mm · 3 of 80 slices shown (6 of 10)]
[im 1/80]
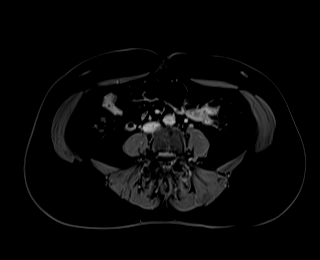
[im 40/80]
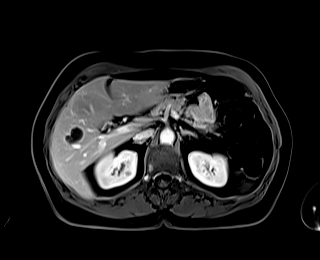
[im 80/80]
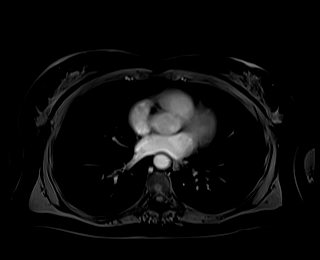

[Series 25: T1 dynamic · axial · 3.0mm · 1.25mm/px · z∈[-110,+127]mm · 3 of 80 slices shown (7 of 10)]
[im 1/80]
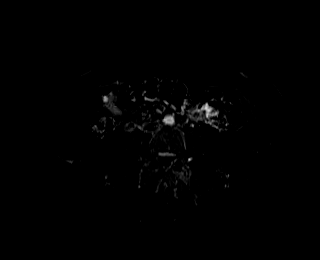
[im 40/80]
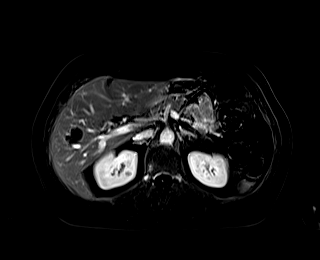
[im 80/80]
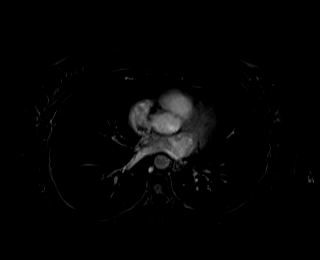

[Series 27: T1 dynamic · coronal · 3.0mm · 1.41mm/px · 3 of 72 slices shown (8 of 10)]
[im 1/72]
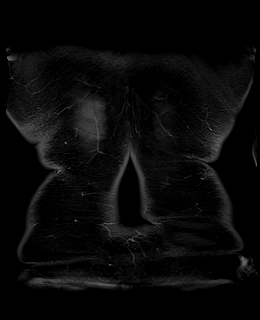
[im 36/72]
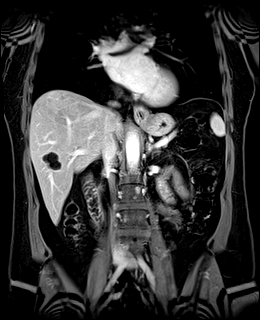
[im 72/72]
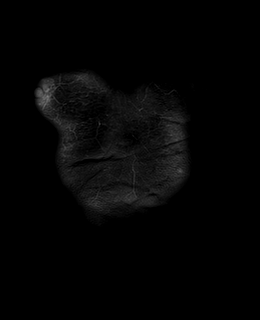

[Series 28: T2 · axial · 6.0mm · 1.56mm/px · 1 of 35 slices shown (2 of 2)]
[im 1/35]
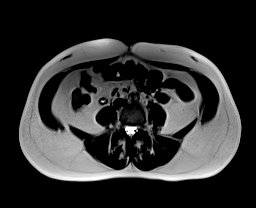

[Series 31: T1 dynamic · axial · 3.0mm · 1.25mm/px · z∈[-110,+127]mm · 3 of 80 slices shown (9 of 10)]
[im 1/80]
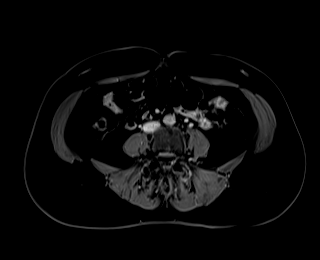
[im 40/80]
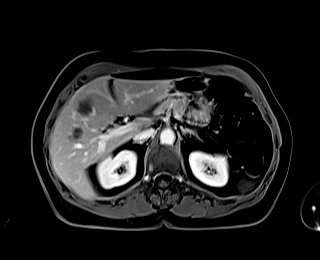
[im 80/80]
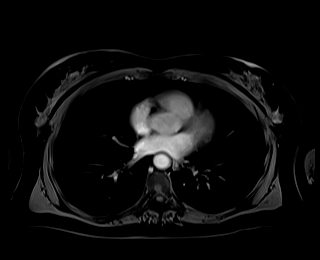

[Series 32: T1 dynamic · axial · 3.0mm · 1.25mm/px · z∈[-110,+127]mm · 3 of 80 slices shown (10 of 10)]
[im 1/80]
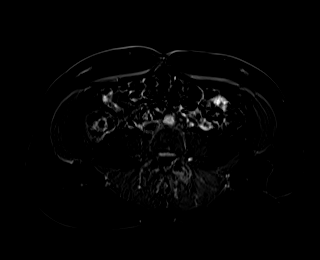
[im 40/80]
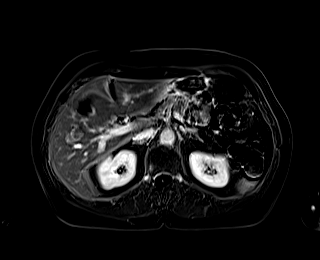
[im 80/80]
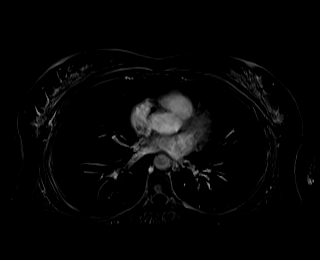

[48 of 48 positions shown; findings below may reference images not displayed]

FINDINGS: Lower chest: Mild atelectasis at the right lung base. The known
right axillary and subpectoral adenopathy is not well visualized on
this study. There are nodular areas of enhancement inferiorly and
laterally in the right breast.

Hepatobiliary: No appendix steatosis or morphologic changes of
cirrhosis. The lesion of concern in the dome of the right hepatic
lobe (segment 7) measures 1.2 x 1.2 cm on image [DATE]. This lesion
demonstrates mild T2 hyperintensity, low T1 signal and restricted
diffusion. Following contrast, there is fairly homogeneous arterial
phase enhancement of the lesion. On delayed post-contrast imaging,
there is no contrast washout with the lesion remaining isointense to
blood pool. No other suspicious hepatic lesions. There is a 5.3 x
4.2 cm cyst in segment 4A and a 2.4 cm cyst in segment 6. No
evidence of gallstones, gallbladder wall thickening or biliary
dilatation.

Pancreas: Unremarkable. No pancreatic ductal dilatation or
surrounding inflammatory changes.

Spleen: Normal in size without focal abnormality.

Adrenals/Urinary Tract: Both adrenal glands appear normal. Small
renal cysts. No evidence of enhancing renal mass or hydronephrosis.

Stomach/Bowel: The stomach appears unremarkable for its degree of
distension. No evidence of bowel wall thickening, distention or
surrounding inflammatory change.

Vascular/Lymphatic: There are no enlarged abdominal lymph nodes. No
significant vascular findings.

Other: No ascites or peritoneal nodularity.

Musculoskeletal: There are numerous T2 hyperintense and enhancing
osseous lesions involving the thoracolumbar spine and visualized
bony pelvis consistent with metastatic disease. The largest lesion
is at T10, measuring up to 2.5 cm in diameter. No pathologic
fracture or significant epidural tumor identified.
IMPRESSION: 1. The lesion of concern in the dome of the right hepatic lobe on
recent chest CT is indeterminate, with low T2 hyperintensity,
homogeneous arterial phase enhancement and no washout on the delayed
images. This could reflect an hepatic adenoma or atypical
hemangioma, although metastatic disease is of at least moderate
concern. Consider PET CT for further characterization.
2. Multiple metastases within the thoracolumbar spine and visualized
bony pelvis. This disease could also be further evaluated with
PET-CT.
3. Enhancing right breast lesions. Known right axillary and
subpectoral adenopathy not well visualized by this abdominal MR.

## 2020-07-26 MED ORDER — GADOBUTROL 1 MMOL/ML IV SOLN
7.5000 mL | Freq: Once | INTRAVENOUS | Status: AC | PRN
  Administered 2020-07-26: 7.5 mL via INTRAVENOUS

## 2020-07-28 ENCOUNTER — Other Ambulatory Visit: Payer: Self-pay

## 2020-07-28 ENCOUNTER — Inpatient Hospital Stay: Payer: 59

## 2020-07-28 ENCOUNTER — Encounter: Payer: Self-pay | Admitting: *Deleted

## 2020-07-28 ENCOUNTER — Other Ambulatory Visit: Payer: Self-pay | Admitting: Adult Health

## 2020-07-28 VITALS — BP 129/78 | HR 72 | Temp 98.4°F | Resp 16 | Wt 169.5 lb

## 2020-07-28 DIAGNOSIS — C50511 Malignant neoplasm of lower-outer quadrant of right female breast: Secondary | ICD-10-CM

## 2020-07-28 DIAGNOSIS — Z5112 Encounter for antineoplastic immunotherapy: Secondary | ICD-10-CM | POA: Diagnosis not present

## 2020-07-28 MED ORDER — SODIUM CHLORIDE 0.9 % IV SOLN
150.0000 mg | Freq: Once | INTRAVENOUS | Status: AC
  Administered 2020-07-28: 150 mg via INTRAVENOUS
  Filled 2020-07-28: qty 150

## 2020-07-28 MED ORDER — SODIUM CHLORIDE 0.9 % IV SOLN
840.0000 mg | Freq: Once | INTRAVENOUS | Status: AC
  Administered 2020-07-28: 840 mg via INTRAVENOUS
  Filled 2020-07-28: qty 28

## 2020-07-28 MED ORDER — METHYLPREDNISOLONE SODIUM SUCC 125 MG IJ SOLR
125.0000 mg | Freq: Once | INTRAMUSCULAR | Status: AC | PRN
  Administered 2020-07-28: 60 mg via INTRAVENOUS

## 2020-07-28 MED ORDER — SODIUM CHLORIDE 0.9 % IV SOLN
Freq: Once | INTRAVENOUS | Status: AC
Start: 2020-07-28 — End: 2020-07-28
  Filled 2020-07-28: qty 250

## 2020-07-28 MED ORDER — DIPHENHYDRAMINE HCL 25 MG PO CAPS
ORAL_CAPSULE | ORAL | Status: AC
  Filled 2020-07-28: qty 1

## 2020-07-28 MED ORDER — SODIUM CHLORIDE 0.9 % IV SOLN
75.0000 mg/m2 | Freq: Once | INTRAVENOUS | Status: AC
  Administered 2020-07-28: 140 mg via INTRAVENOUS
  Filled 2020-07-28: qty 14

## 2020-07-28 MED ORDER — ACETAMINOPHEN 325 MG PO TABS
650.0000 mg | ORAL_TABLET | Freq: Once | ORAL | Status: AC
  Administered 2020-07-28: 650 mg via ORAL

## 2020-07-28 MED ORDER — ACETAMINOPHEN 325 MG PO TABS
ORAL_TABLET | ORAL | Status: AC
  Filled 2020-07-28: qty 2

## 2020-07-28 MED ORDER — PALONOSETRON HCL INJECTION 0.25 MG/5ML
INTRAVENOUS | Status: AC
  Filled 2020-07-28: qty 5

## 2020-07-28 MED ORDER — TRASTUZUMAB-ANNS CHEMO 150 MG IV SOLR
8.0000 mg/kg | Freq: Once | INTRAVENOUS | Status: AC
  Administered 2020-07-28: 630 mg via INTRAVENOUS
  Filled 2020-07-28: qty 30

## 2020-07-28 MED ORDER — SODIUM CHLORIDE 0.9 % IV SOLN
10.0000 mg | Freq: Once | INTRAVENOUS | Status: AC
  Administered 2020-07-28: 10 mg via INTRAVENOUS
  Filled 2020-07-28: qty 10

## 2020-07-28 MED ORDER — PALONOSETRON HCL INJECTION 0.25 MG/5ML
0.2500 mg | Freq: Once | INTRAVENOUS | Status: AC
  Administered 2020-07-28: 0.25 mg via INTRAVENOUS

## 2020-07-28 MED ORDER — SODIUM CHLORIDE 0.9% FLUSH
10.0000 mL | INTRAVENOUS | Status: DC | PRN
  Administered 2020-07-28: 10 mL
  Filled 2020-07-28: qty 10

## 2020-07-28 MED ORDER — HEPARIN SOD (PORK) LOCK FLUSH 100 UNIT/ML IV SOLN
500.0000 [IU] | Freq: Once | INTRAVENOUS | Status: AC | PRN
  Administered 2020-07-28: 500 [IU]
  Filled 2020-07-28: qty 5

## 2020-07-28 MED ORDER — SODIUM CHLORIDE 0.9 % IV SOLN
573.0000 mg | Freq: Once | INTRAVENOUS | Status: AC
  Administered 2020-07-28: 570 mg via INTRAVENOUS
  Filled 2020-07-28: qty 57

## 2020-07-28 MED ORDER — DIPHENHYDRAMINE HCL 25 MG PO CAPS
25.0000 mg | ORAL_CAPSULE | Freq: Once | ORAL | Status: AC
  Administered 2020-07-28: 25 mg via ORAL

## 2020-07-28 NOTE — Progress Notes (Signed)
This pt is scheduled for day 1 cycle 1 TCHP. Pt had reached cruising rate for her Taxotere and was approximately 10-15 min from completion when she notified this RN that her cheecks "felt tingly." Infusion was stopped at this time (1535) vitals were taken and were stable, Thedore Mins, NP was notified and 60 mg of solumedrol was given along with some NS. After 10 minutes drug was restarted (1554) and pt reported the tingly feeling gone, and had had no other complaints during this time. Pt was able to finish Taxotere infusion without any other complaints. NP notified.

## 2020-07-28 NOTE — Progress Notes (Signed)
Paitent MRI Abdomen and CT scan abdomen and pelvis are indeterminate for liver lesion etiology.  PET scan order placed.    Wilber Bihari, NP

## 2020-07-28 NOTE — Patient Instructions (Signed)
[facility]  Discharge Instructions: Thank you for choosing [facility] to provide your oncology and hematology care.   If you have a lab appointment with the [facility], please go directly to the [facility] and check in at the registration area.   Wear comfortable clothing and clothing appropriate for easy access to any Portacath or PICC line.   We strive to give you quality time with your provider. You may need to reschedule your appointment if you arrive late (15 or more minutes).  Arriving late affects you and other patients whose appointments are after yours.  Also, if you miss three or more appointments without notifying the office, you may be dismissed from the clinic at the provider's discretion.      For prescription refill requests, have your pharmacy contact our office and allow 72 hours for refills to be completed.    Today you received the following chemotherapy and/or immunotherapy agents: trastuzumab, pertuzumab, docetaxel, carboplatin.      To help prevent nausea and vomiting after your treatment, we encourage you to take your nausea medication as directed.  BELOW ARE SYMPTOMS THAT SHOULD BE REPORTED IMMEDIATELY: *FEVER GREATER THAN 100.4 F (38 C) OR HIGHER *CHILLS OR SWEATING *NAUSEA AND VOMITING THAT IS NOT CONTROLLED WITH YOUR NAUSEA MEDICATION *UNUSUAL SHORTNESS OF BREATH *UNUSUAL BRUISING OR BLEEDING *URINARY PROBLEMS (pain or burning when urinating, or frequent urination) *BOWEL PROBLEMS (unusual diarrhea, constipation, pain near the anus) TENDERNESS IN MOUTH AND THROAT WITH OR WITHOUT PRESENCE OF ULCERS (sore throat, sores in mouth, or a toothache) UNUSUAL RASH, SWELLING OR PAIN  UNUSUAL VAGINAL DISCHARGE OR ITCHING   Items with * indicate a potential emergency and should be followed up as soon as possible or go to the Emergency Department if any problems should occur.  Please show the CHEMOTHERAPY ALERT CARD or  IMMUNOTHERAPY ALERT CARD at check-in to the Emergency Department and triage nurse.  Should you have questions after your visit or need to cancel or reschedule your appointment, please contact [facility]  Dept: [phone]  and follow the prompts.  Office hours are 8:00 a.m. to 4:30 p.m. Monday - Friday. Please note that voicemails left after 4:00 p.m. may not be returned until the following business day.  We are closed weekends and major holidays. You have access to a nurse at all times for urgent questions. Please call the main number to the clinic Dept: [phone] and follow the prompts.   For any non-urgent questions, you may also contact your provider using MyChart. We now offer e-Visits for anyone 56 and older to request care online for non-urgent symptoms. For details visit [email or web address].   Also download the MyChart app! Go to the app store, search "MyChart", open the app, select [facility], and log in with your MyChart username and password.  Due to Covid, a mask is required upon entering the hospital/clinic. If you do not have a mask, one will be given to you upon arrival. For doctor visits, patients may have 1 support person aged 56 or older with them. For treatment visits, patients cannot have anyone with them due to current Covid guidelines and our immunocompromised population.    Trastuzumab; Hyaluronidase injection What is this medication? TRASTUZUMAB; HYALURONIDASE (tras TOO zoo mab / hye al ur ON i dase) is used to treat breast cancer and stomach cancer. Trastuzumab is a monoclonal antibody.Hyaluronidase is used to improve the effects of trastuzumab. This medicine may  be used for other purposes; ask your health care provider orpharmacist if you have questions. COMMON BRAND NAME(S): HERCEPTIN HYLECTA What should I tell my care team before I take this medication? They need to know if you have any of these conditions: heart disease heart  failure lung or breathing disease, like asthma an unusual or allergic reaction to trastuzumab, or other medications, foods, dyes, or preservatives pregnant or trying to get pregnant breast-feeding How should I use this medication? This medicine is for injection under the skin. It is given by a health careprofessional in a hospital or clinic setting. Talk to your pediatrician regarding the use of this medicine in children. Thismedicine is not approved for use in children. Overdosage: If you think you have taken too much of this medicine contact apoison control center or emergency room at once. NOTE: This medicine is only for you. Do not share this medicine with others. What if I miss a dose? It is important not to miss a dose. Call your doctor or health careprofessional if you are unable to keep an appointment. What may interact with this medication? This medicine may interact with the following medications: certain types of chemotherapy, such as daunorubicin, doxorubicin, epirubicin, and idarubicin This list may not describe all possible interactions. Give your health care provider a list of all the medicines, herbs, non-prescription drugs, or dietary supplements you use. Also tell them if you smoke, drink alcohol, or use illegaldrugs. Some items may interact with your medicine. What should I watch for while using this medication? Visit your doctor for checks on your progress. Report any side effects. Continue your course of treatment even though you feel ill unless your doctortells you to stop. Call your doctor or health care professional for advice if you get a fever, chills or sore throat, or other symptoms of a cold or flu. Do not treatyourself. Try to avoid being around people who are sick. You may experience fever, chills and shaking during your first infusion. These effects are usually mild and can be treated with other medicines. Report any side effects during the infusion to your health  care professional. Fever andchills usually do not happen with later infusions. Do not become pregnant while taking this medicine or for 7 months after stopping it. Women should inform their doctor if they wish to become pregnant or think they might be pregnant. Women of child-bearing potential will need to have a negative pregnancy test before starting this medicine. There is a potential for serious side effects to an unborn child. Talk to your health care professional or pharmacist for more information. Do not breast-feed an infantwhile taking this medicine or for 7 months after stopping it. What side effects may I notice from receiving this medication? Side effects that you should report to your doctor or health care professionalas soon as possible: allergic reactions like skin rash, itching or hives, swelling of the face, lips, or tongue breathing problems chest pain or palpitations cough fever general ill feeling or flu-like symptoms signs of worsening heart failure like breathing problems; swelling in your legs and feet Side effects that usually do not require medical attention (report these toyour doctor or health care professional if they continue or are bothersome): bone pain changes in taste diarrhea joint pain nausea/vomiting unusually weak or tired weight loss This list may not describe all possible side effects. Call your doctor for medical advice about side effects. You may report side effects to FDA at1-800-FDA-1088. Where should I keep my medication?  This drug is given in a hospital or clinic and will not be stored at home. NOTE: This sheet is a summary. It may not cover all possible information. If you have questions about this medicine, talk to your doctor, pharmacist, orhealth care provider.  2022 Elsevier/Gold Standard (2017-04-22 21:54:17)   Pertuzumab injection What is this medication? PERTUZUMAB (per TOOZ ue mab) is a monoclonal antibody. It is used to treatbreast  cancer. This medicine may be used for other purposes; ask your health care provider orpharmacist if you have questions. COMMON BRAND NAME(S): PERJETA What should I tell my care team before I take this medication? They need to know if you have any of these conditions: heart disease heart failure high blood pressure history of irregular heart beat recent or ongoing radiation therapy an unusual or allergic reaction to pertuzumab, other medicines, foods, dyes, or preservatives pregnant or trying to get pregnant breast-feeding How should I use this medication? This medicine is for infusion into a vein. It is given by a health careprofessional in a hospital or clinic setting. Talk to your pediatrician regarding the use of this medicine in children.Special care may be needed. Overdosage: If you think you have taken too much of this medicine contact apoison control center or emergency room at once. NOTE: This medicine is only for you. Do not share this medicine with others. What if I miss a dose? It is important not to miss your dose. Call your doctor or health careprofessional if you are unable to keep an appointment. What may interact with this medication? Interactions are not expected. Give your health care provider a list of all the medicines, herbs, non-prescription drugs, or dietary supplements you use. Also tell them if you smoke, drink alcohol, or use illegal drugs. Some items may interact with yourmedicine. This list may not describe all possible interactions. Give your health care provider a list of all the medicines, herbs, non-prescription drugs, or dietary supplements you use. Also tell them if you smoke, drink alcohol, or use illegaldrugs. Some items may interact with your medicine. What should I watch for while using this medication? Your condition will be monitored carefully while you are receiving this medicine. Report any side effects. Continue your course of treatment eventhough  you feel ill unless your doctor tells you to stop. Do not become pregnant while taking this medicine or for 7 months after stopping it. Women should inform their doctor if they wish to become pregnant or think they might be pregnant. Women of child-bearing potential will need to have a negative pregnancy test before starting this medicine. There is a potential for serious side effects to an unborn child. Talk to your health care professional or pharmacist for more information. Do not breast-feed an infantwhile taking this medicine or for 7 months after stopping it. Women must use effective birth control with this medicine. Call your doctor or health care professional for advice if you get a fever, chills or sore throat, or other symptoms of a cold or flu. Do not treatyourself. Try to avoid being around people who are sick. You may experience fever, chills, and headache during the infusion. Report anyside effects during the infusion to your health care professional. What side effects may I notice from receiving this medication? Side effects that you should report to your doctor or health care professionalas soon as possible: breathing problems chest pain or palpitations dizziness feeling faint or lightheaded fever or chills skin rash, itching or hives sore throat swelling of the  face, lips, or tongue swelling of the legs or ankles unusually weak or tired Side effects that usually do not require medical attention (report to yourdoctor or health care professional if they continue or are bothersome): diarrhea hair loss nausea, vomiting tiredness This list may not describe all possible side effects. Call your doctor for medical advice about side effects. You may report side effects to FDA at1-800-FDA-1088. Where should I keep my medication? This drug is given in a hospital or clinic and will not be stored at home. NOTE: This sheet is a summary. It may not cover all possible information. If you  have questions about this medicine, talk to your doctor, pharmacist, orhealth care provider.  2022 Elsevier/Gold Standard (2015-03-06 12:08:50)   Docetaxel injection What is this medication? DOCETAXEL (doe se TAX el) is a chemotherapy drug. It targets fast dividing cells, like cancer cells, and causes these cells to die. This medicine is used to treat many types of cancers like breast cancer, certain stomach cancers,head and neck cancer, lung cancer, and prostate cancer. This medicine may be used for other purposes; ask your health care provider orpharmacist if you have questions. COMMON BRAND NAME(S): Docefrez, Taxotere What should I tell my care team before I take this medication? They need to know if you have any of these conditions: infection (especially a virus infection such as chickenpox, cold sores, or herpes) liver disease low blood counts, like low white cell, platelet, or red cell counts an unusual or allergic reaction to docetaxel, polysorbate 80, other chemotherapy agents, other medicines, foods, dyes, or preservatives pregnant or trying to get pregnant breast-feeding How should I use this medication? This drug is given as an infusion into a vein. It is administered in a hospitalor clinic by a specially trained health care professional. Talk to your pediatrician regarding the use of this medicine in children.Special care may be needed. Overdosage: If you think you have taken too much of this medicine contact apoison control center or emergency room at once. NOTE: This medicine is only for you. Do not share this medicine with others. What if I miss a dose? It is important not to miss your dose. Call your doctor or health careprofessional if you are unable to keep an appointment. What may interact with this medication? Do not take this medicine with any of the following medications: live virus vaccines This medicine may also interact with the following  medications: aprepitant certain antibiotics like erythromycin or clarithromycin certain antivirals for HIV or hepatitis certain medicines for fungal infections like fluconazole, itraconazole, ketoconazole, posaconazole, or voriconazole cimetidine ciprofloxacin conivaptan cyclosporine dronedarone fluvoxamine grapefruit juice imatinib verapamil This list may not describe all possible interactions. Give your health care provider a list of all the medicines, herbs, non-prescription drugs, or dietary supplements you use. Also tell them if you smoke, drink alcohol, or use illegaldrugs. Some items may interact with your medicine. What should I watch for while using this medication? Your condition will be monitored carefully while you are receiving this medicine. You will need important blood work done while you are taking thismedicine. Call your doctor or health care professional for advice if you get a fever, chills or sore throat, or other symptoms of a cold or flu. Do not treat yourself. This drug decreases your body's ability to fight infections. Try toavoid being around people who are sick. Some products may contain alcohol. Ask your health care professional if this medicine contains alcohol. Be sure to tell all health care professionals you  are taking this medicine. Certain medicines, like metronidazole and disulfiram, can cause an unpleasant reaction when taken with alcohol. The reaction includes flushing, headache, nausea, vomiting, sweating, and increased thirst. Thereaction can last from 30 minutes to several hours. You may get drowsy or dizzy. Do not drive, use machinery, or do anything that needs mental alertness until you know how this medicine affects you. Do not stand or sit up quickly, especially if you are an older patient. This reduces the risk of dizzy or fainting spells. Alcohol may interfere with the effect ofthis medicine. Talk to your health care professional about your risk of  cancer. You may bemore at risk for certain types of cancer if you take this medicine. Do not become pregnant while taking this medicine or for 6 months after stopping it. Women should inform their doctor if they wish to become pregnant or think they might be pregnant. There is a potential for serious side effects to an unborn child. Talk to your health care professional or pharmacist for more information. Do not breast-feed an infant while taking this medicine orfor 1 week after stopping it. Males who get this medicine must use a condom during sex with females who can get pregnant. If you get a woman pregnant, the baby could have birth defects. The baby could die before they are born. You will need to continue wearing a condom for 3 months after stopping the medicine. Tell your health care providerright away if your partner becomes pregnant while you are taking this medicine. This may interfere with the ability to father a child. You should talk to yourdoctor or health care professional if you are concerned about your fertility. What side effects may I notice from receiving this medication? Side effects that you should report to your doctor or health care professionalas soon as possible: allergic reactions like skin rash, itching or hives, swelling of the face, lips, or tongue blurred vision breathing problems changes in vision low blood counts - This drug may decrease the number of white blood cells, red blood cells and platelets. You may be at increased risk for infections and bleeding. nausea and vomiting pain, redness or irritation at site where injected pain, tingling, numbness in the hands or feet redness, blistering, peeling, or loosening of the skin, including inside the mouth signs of decreased platelets or bleeding - bruising, pinpoint red spots on the skin, black, tarry stools, nosebleeds signs of decreased red blood cells - unusually weak or tired, fainting spells, lightheadedness signs  of infection - fever or chills, cough, sore throat, pain or difficulty passing urine swelling of the ankle, feet, hands Side effects that usually do not require medical attention (report to yourdoctor or health care professional if they continue or are bothersome): constipation diarrhea fingernail or toenail changes hair loss loss of appetite mouth sores muscle pain This list may not describe all possible side effects. Call your doctor for medical advice about side effects. You may report side effects to FDA at1-800-FDA-1088. Where should I keep my medication? This drug is given in a hospital or clinic and will not be stored at home. NOTE: This sheet is a summary. It may not cover all possible information. If you have questions about this medicine, talk to your doctor, pharmacist, orhealth care provider.  2022 Elsevier/Gold Standard (2019-01-01 19:50:31)  Carboplatin injection What is this medication? CARBOPLATIN (KAR boe pla tin) is a chemotherapy drug. It targets fast dividing cells, like cancer cells, and causes these cells to die. This  medicine is usedto treat ovarian cancer and many other cancers. This medicine may be used for other purposes; ask your health care provider orpharmacist if you have questions. COMMON BRAND NAME(S): Paraplatin What should I tell my care team before I take this medication? They need to know if you have any of these conditions: blood disorders hearing problems kidney disease recent or ongoing radiation therapy an unusual or allergic reaction to carboplatin, cisplatin, other chemotherapy, other medicines, foods, dyes, or preservatives pregnant or trying to get pregnant breast-feeding How should I use this medication? This drug is usually given as an infusion into a vein. It is administered in [facility] or clinic by a specially trained health care professional. Talk to your pediatrician regarding the use of this medicine in children.Special care may be  needed. Overdosage: If you think you have taken too much of this medicine contact apoison control center or emergency room at once. NOTE: This medicine is only for you. Do not share this medicine with others. What if I miss a dose? It is important not to miss a dose. Call your doctor or health careprofessional if you are unable to keep an appointment. What may interact with this medication? medicines for seizures medicines to increase blood counts like filgrastim, pegfilgrastim, sargramostim some antibiotics like amikacin, gentamicin, neomycin, streptomycin, tobramycin vaccines Talk to your doctor or health care professional before taking any of thesemedicines: acetaminophen aspirin ibuprofen ketoprofen naproxen This list may not describe all possible interactions. Give your health care provider a list of all the medicines, herbs, non-prescription drugs, or dietary supplements you use. Also tell them if you smoke, drink alcohol, or use illegaldrugs. Some items may interact with your medicine. What should I watch for while using this medication? Your condition will be monitored carefully while you are receiving this medicine. You will need important blood work done while you are taking thismedicine. This drug may make you feel generally unwell. This is not uncommon, as chemotherapy can affect healthy cells as well as cancer cells. Report any side effects. Continue your course of treatment even though you feel ill unless yourdoctor tells you to stop. In some cases, you may be given additional medicines to help with side effects.Follow all directions for their use. Call your doctor or health care professional for advice if you get a fever, chills or sore throat, or other symptoms of a cold or flu. Do not treat yourself. This drug decreases your body's ability to fight infections. Try toavoid being around people who are sick. This medicine may increase your risk to bruise or bleed. Call your doctor  orhealth care professional if you notice any unusual bleeding. Be careful brushing and flossing your teeth or using a toothpick because you may get an infection or bleed more easily. If you have any dental work done,tell your dentist you are receiving this medicine. Avoid taking products that contain aspirin, acetaminophen, ibuprofen, naproxen, or ketoprofen unless instructed by your doctor. These medicines may hide afever. Do not become pregnant while taking this medicine. Women should inform their doctor if they wish to become pregnant or think they might be pregnant. There is a potential for serious side effects to an unborn child. Talk to your health care professional or pharmacist for more information. Do not breast-feed aninfant while taking this medicine. What side effects may I notice from receiving this medication? Side effects that you should report to your doctor or health care professionalas soon as possible: allergic reactions like skin rash, itching or  hives, swelling of the face, lips, or tongue signs of infection - fever or chills, cough, sore throat, pain or difficulty passing urine signs of decreased platelets or bleeding - bruising, pinpoint red spots on the skin, black, tarry stools, nosebleeds signs of decreased red blood cells - unusually weak or tired, fainting spells, lightheadedness breathing problems changes in hearing changes in vision chest pain high blood pressure low blood counts - This drug may decrease the number of white blood cells, red blood cells and platelets. You may be at increased risk for infections and bleeding. nausea and vomiting pain, swelling, redness or irritation at the injection site pain, tingling, numbness in the hands or feet problems with balance, talking, walking trouble passing urine or change in the amount of urine Side effects that usually do not require medical attention (report to yourdoctor or health care professional if they continue  or are bothersome): hair loss loss of appetite metallic taste in the mouth or changes in taste This list may not describe all possible side effects. Call your doctor for medical advice about side effects. You may report side effects to FDA at1-800-FDA-1088. Where should I keep my medication? This drug is given in a hospital or clinic and will not be stored at home. NOTE: This sheet is a summary. It may not cover all possible information. If you have questions about this medicine, talk to your doctor, pharmacist, orhealth care provider.  2022 Elsevier/Gold Standard (2007-05-09 14:38:05)

## 2020-07-30 ENCOUNTER — Other Ambulatory Visit: Payer: Self-pay

## 2020-07-30 ENCOUNTER — Inpatient Hospital Stay: Payer: 59

## 2020-07-30 VITALS — BP 151/79 | HR 69 | Temp 98.5°F | Resp 18

## 2020-07-30 DIAGNOSIS — Z5112 Encounter for antineoplastic immunotherapy: Secondary | ICD-10-CM | POA: Diagnosis not present

## 2020-07-30 DIAGNOSIS — C50511 Malignant neoplasm of lower-outer quadrant of right female breast: Secondary | ICD-10-CM

## 2020-07-30 MED ORDER — PEGFILGRASTIM-BMEZ 6 MG/0.6ML ~~LOC~~ SOSY
PREFILLED_SYRINGE | SUBCUTANEOUS | Status: AC
  Filled 2020-07-30: qty 0.6

## 2020-07-30 MED ORDER — PEGFILGRASTIM-BMEZ 6 MG/0.6ML ~~LOC~~ SOSY
6.0000 mg | PREFILLED_SYRINGE | Freq: Once | SUBCUTANEOUS | Status: AC
  Administered 2020-07-30: 6 mg via SUBCUTANEOUS

## 2020-07-30 NOTE — Patient Instructions (Signed)

## 2020-07-31 ENCOUNTER — Other Ambulatory Visit: Payer: Self-pay | Admitting: Oncology

## 2020-08-01 ENCOUNTER — Telehealth: Payer: Self-pay | Admitting: *Deleted

## 2020-08-01 ENCOUNTER — Ambulatory Visit (HOSPITAL_COMMUNITY)
Admission: RE | Admit: 2020-08-01 | Discharge: 2020-08-01 | Disposition: A | Discharge status: Home / Self Care | Payer: 59 | Source: Ambulatory Visit | Attending: Adult Health | Admitting: Adult Health

## 2020-08-01 ENCOUNTER — Other Ambulatory Visit: Payer: Self-pay

## 2020-08-01 DIAGNOSIS — Z170 Estrogen receptor positive status [ER+]: Secondary | ICD-10-CM | POA: Insufficient documentation

## 2020-08-01 DIAGNOSIS — C50511 Malignant neoplasm of lower-outer quadrant of right female breast: Secondary | ICD-10-CM | POA: Insufficient documentation

## 2020-08-01 LAB — GLUCOSE, CAPILLARY: Glucose-Capillary: 80 mg/dL (ref 70–99)

## 2020-08-01 IMAGING — PT NM PET TUM IMG INITIAL (PI) SKULL BASE T - THIGH
7 series · 25 of 25 positions shown · non-contrast
Comparison: Abdominal MRI [DATE].  Chest CT [DATE].

CLINICAL DATA: Initial treatment strategy for breast cancer.

EXAM:
NUCLEAR MEDICINE PET SKULL BASE TO THIGH
TECHNIQUE: 8.2 mCi F-18 FDG was injected intravenously. Full-ring PET imaging
was performed from the skull base to thigh after the radiotracer. CT
data was obtained and used for attenuation correction and anatomic
localization.
Fasting blood glucose: 80 mg/dl

[Series 3: pet sk_thigh ac · axial · 5.0mm · 4.07mm/px · z∈[-1256,-372]mm · 6 of 222 slices shown]
[im 1/222]
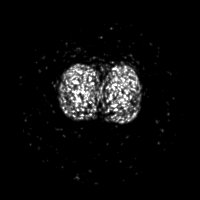
[im 45/222]
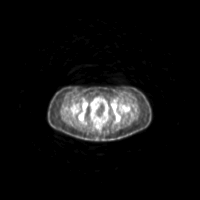
[im 89/222]
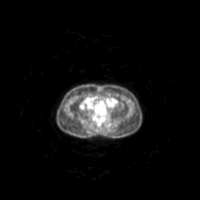
[im 133/222]
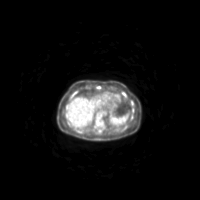
[im 177/222]
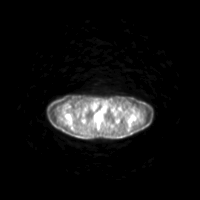
[im 222/222]
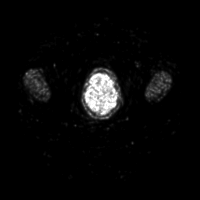

[Series 4: ct sk_thigh 5.0 bf37 · axial · 5.0mm · 0.98mm/px · z∈[-1256,-372]mm · 6 of 221 slices shown]
[im 1/221]
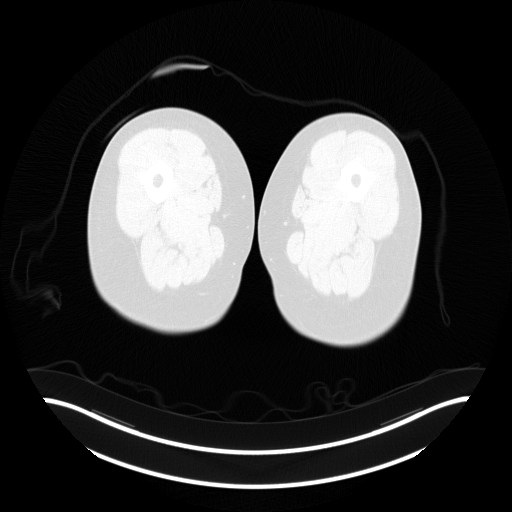
[im 45/221]
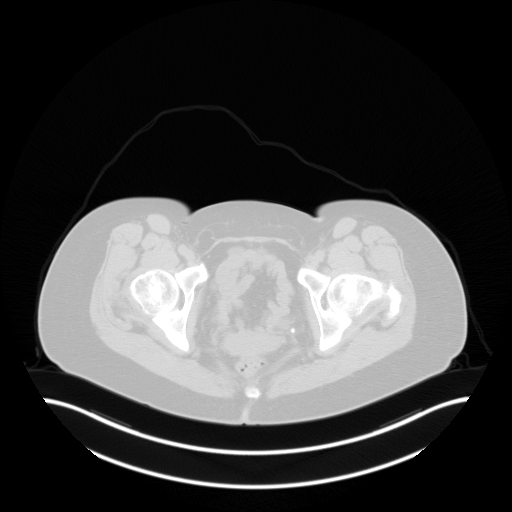
[im 89/221]
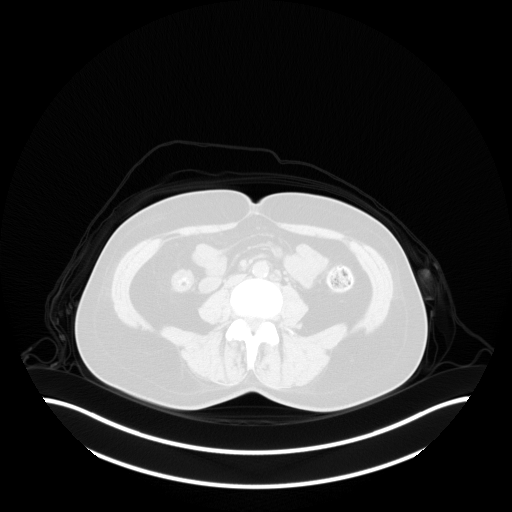
[im 133/221]
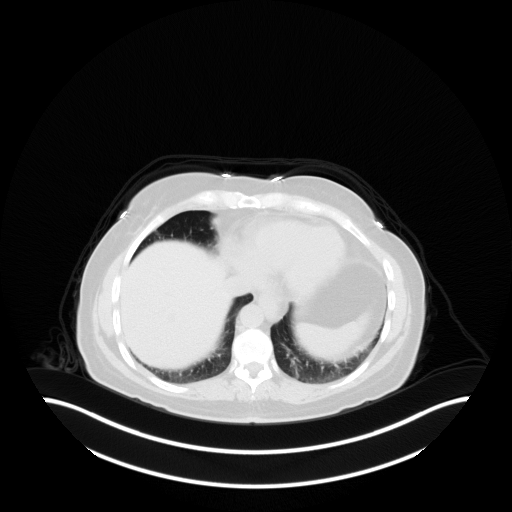
[im 177/221]
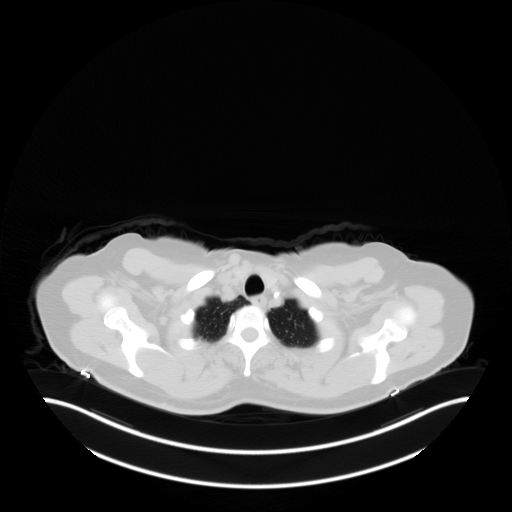
[im 221/221  brain]
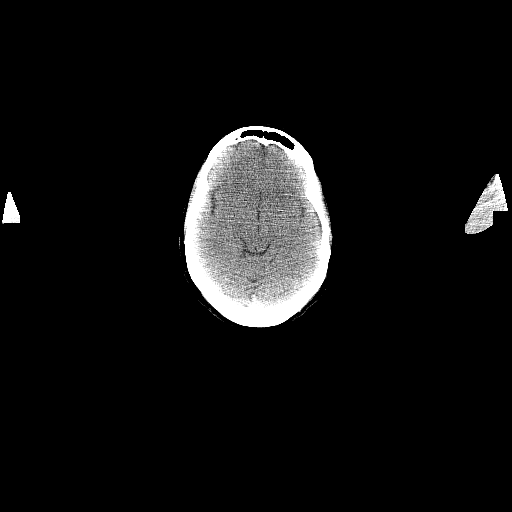

[Series 5: pet sk_thigh nac · axial · 5.0mm · 4.07mm/px · z∈[-1256,-372]mm · 5 of 222 slices shown]
[im 1/222]
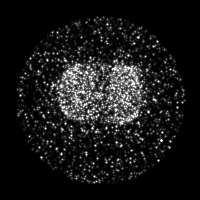
[im 56/222]
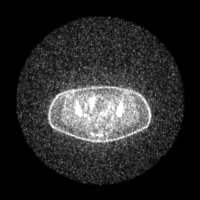
[im 111/222]
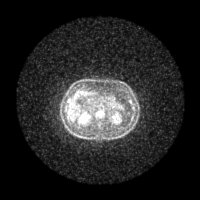
[im 166/222]
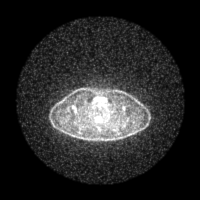
[im 222/222]
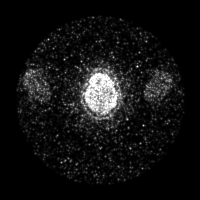

[Series 8: ct sk_thigh 5.0 br59 lung_bone · axial · 5.0mm · 0.65mm/px · 1 of 60 slices shown]
[im 1/60]
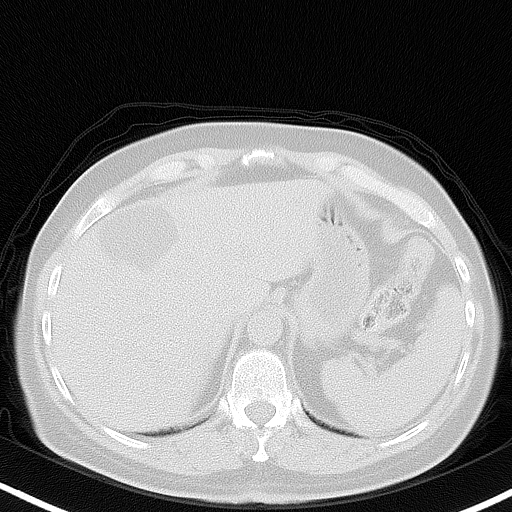

[Series 603: fused cor · 1 of 57 slices shown]
[im 1/57]
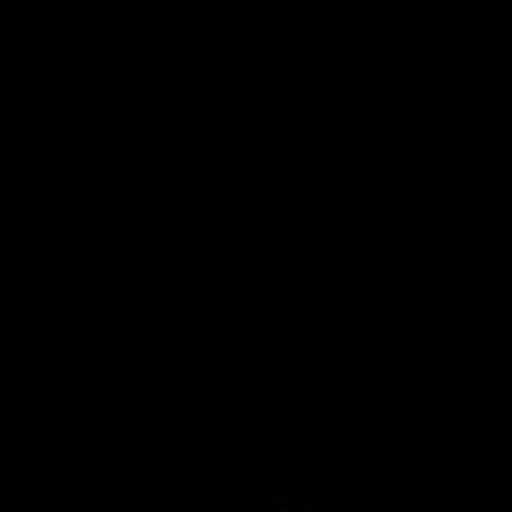

[Series 604: <mip collection> · coronal · 1.83mm/px · 1 of 32 slices shown]
[im 1/32]
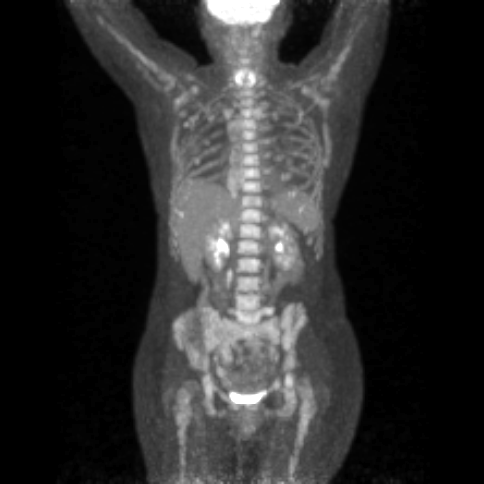

[Series 605: range-ct sk_thigh 5.0 bf37-tra-<alpha range> · 5 of 215 slices shown]
[im 1/215]
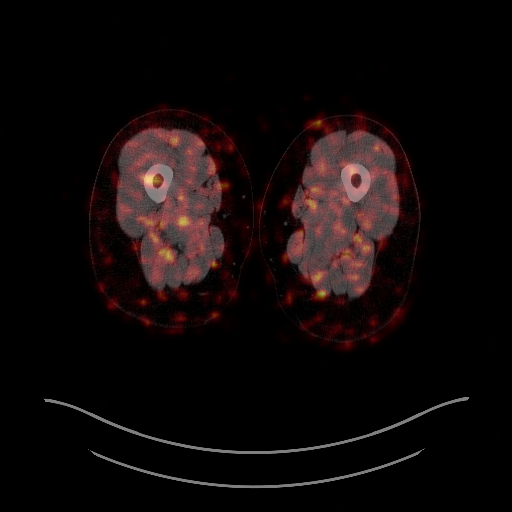
[im 54/215]
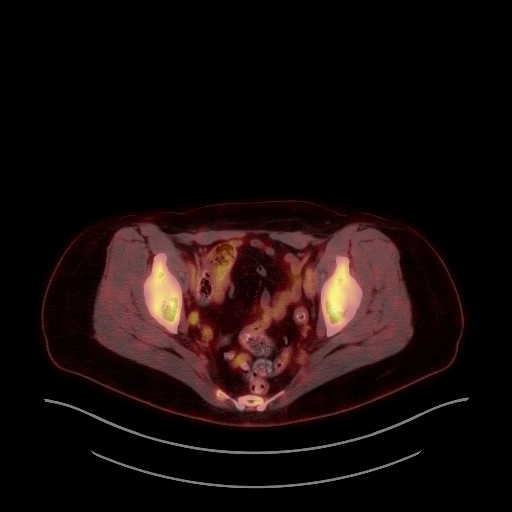
[im 108/215]
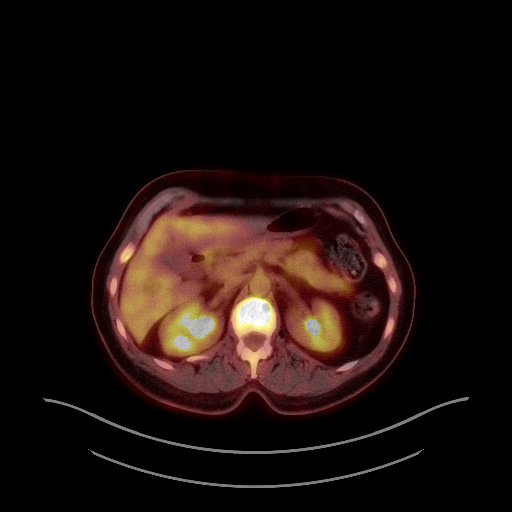
[im 161/215]
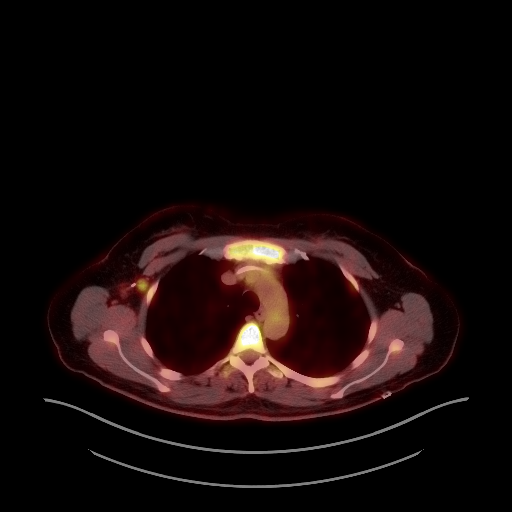
[im 215/215]
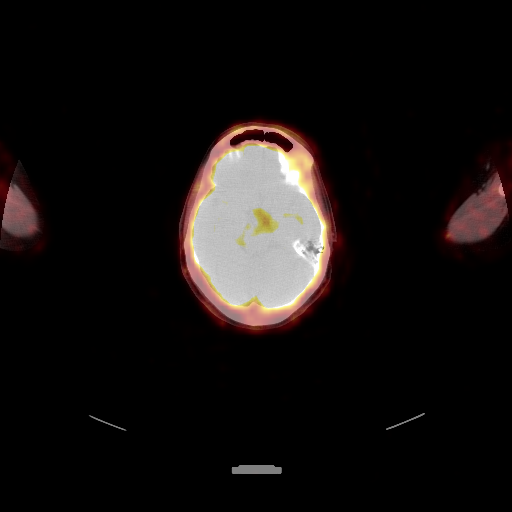

[25 of 25 positions shown; findings below may reference images not displayed]

FINDINGS: Mediastinal blood pool activity: SUV max

Liver activity: SUV max NA

NECK: No hypermetabolic lymph nodes in the neck.

Incidental CT findings: none

CHEST: The lateral right breast lesion with biopsy marker shows low
level hypermetabolism with SUV max = 2.8. There is hypermetabolic
right axillary lymphadenopathy. 10 mm short axis right axillary node
on 57/4 demonstrates SUV max = 4.0 12 mm short axis right axillary
node on 53/4 demonstrates SUV max = 2.8. No hypermetabolic
metastatic lymphadenopathy in the mediastinum or hilar regions. No
hypermetabolic disease in the left axilla. No hypermetabolic
pulmonary nodule evident

Incidental CT findings: Left-sided Port-A-Cath tip is positioned in
the distal SVC.

ABDOMEN/PELVIS: No abnormal hypermetabolic activity within the
liver, pancreas, adrenal glands, or spleen. Specifically, the 12 mm
lesion in the dome of the liver evaluated on recent MRI shows no
hypermetabolism on PET imaging today. No hypermetabolic lymph nodes
in the abdomen or pelvis.

Symmetric nonspecific uptake noted in both ovaries. No adnexal mass.

Incidental CT findings: Multiple hepatic cysts again noted.

SKELETON: Relatively diffuse nodular hypermetabolism throughout the
visualized skeletal elements is compatible with the known metastatic
disease mainly involving the sternum, thoracolumbar spine and bony
pelvis with some mild relative sparing of the ribs.

Incidental CT findings: none
IMPRESSION: 1. Hypermetabolic lateral right breast lesion with hypermetabolic
lymph nodes in the right axilla consistent with metastatic disease.
2. No other evidence for soft tissue metastases on today's study.
Specifically, the lesion in the dome of the liver shows no
hypermetabolism on today's exam.
3. Relatively widespread nodular hypermetabolic uptake in the bony
anatomy consistent with metastatic involvement.

## 2020-08-01 MED ORDER — FLUDEOXYGLUCOSE F - 18 (FDG) INJECTION
8.8000 | Freq: Once | INTRAVENOUS | Status: AC
  Administered 2020-08-01: 8.17 via INTRAVENOUS

## 2020-08-01 NOTE — Telephone Encounter (Signed)
This RN returned call to pt per her VM - stating having mild nose bleeds.  She states over the past 3 days since being treated she has noticed in the morning some mild blood on the tissue when doing her morning care.  This AM she amount on the tissue was increased and she has noted some very light bloody drainage.  Above is intermittent as well as " today I am more emotional - so I am crying some and that seems to agitate the nose bleeds"  Per discussion she denies any gross volume of bleeding and denies any clots of blood.  This RN reviewed above likely secondary to treatment and increased dryness of mucous membranes.  Advised pt to use saline spray for moisture.  Kim Archer also states she is having taste aversion " everything taste bad- even water "  This RN recommended use of salt water rinses to cleanse her palate as well as tablespoon of coconut oil - she can swish to moisturize mouth which both may assist with improved taste.  Discussed need for fluid intake over food intake if she feels anorexia- and reviewed use of water enhancers ( powders or drops ) that may improve taste and allow her drink better.  Kim Archer verbalized understanding.  Of note she is presently on her way for her PET staging scan.  She also requested a prescription for a wig for reimbursement of taxes on purchases .

## 2020-08-03 NOTE — Progress Notes (Addendum)
Kim Archer  [phone] [phone]     ID: Kim Archer DOB: 1965-08-10  MR#: [id number]  FEO#:[id number]  Patient Care Team: [name], MD as PCP - General (Obstetrics and Gynecology) Richerd Grime, Virgie Dad, MD as Consulting Physician (Oncology) Jovita Kussmaul, MD as Consulting Physician (General Surgery) [name], MD as Consulting Physician (Obstetrics and Gynecology) Mauro Kaufmann, RN as Oncology Nurse Navigator Rockwell Germany, RN as Oncology Nurse Navigator Jovita Kussmaul, MD as Consulting Physician (General Surgery) Aprile Dickenson, Virgie Dad, MD as Consulting Physician (Oncology) Gery Pray, MD as Consulting Physician (Radiation Oncology) Chauncey Cruel, MD OTHER MD:  CHIEF COMPLAINT: Triple positive breast cancer  CURRENT TREATMENT: Neoadjuvant chemotherapy.   INTERVAL HISTORY: Kim Archer returns today for follow up and treatment of her triple positive breast cancer.  She is accompanied by her husband Kim Archer  She began neoadjuvant chemotherapy, consisting of Docetaxel, Carboplatin, trastuzumab and Pertuzumab given on day 1 of a 21 day cycle with Neulasta or biosimilar given on day 3, on 07/28/2020.    Her most recent echocardiogram was completed on 07/03/2020 showing and ejection fraction of 55-60%.  Since her initial treatment she has been found to have stage IV disease.  Kim Archer has a breast MRI 07/07/2020 showing 2 lesions in the right breast and 3 abnormal axillary nodes, but also a sternal lesion. Subsequent studies included a chest CT scan 08/11/20 showing some inhomogeneous areas in the liver as well as liver cysts, and multiple bone lesions suspicious for metastases. Bone scan 07/25/2020 noted several of these lesions, including the on on the sternum. Liver MRI on 07/26/2020 showed only one 1.2 cm right liver lesion of concern. PET scan 08/01/2020 fortunately as shows no uptake in the single liver lesion of concern.   Accordingly we have disease in the breast and bones but no other visceral disease   REVIEW OF SYSTEMS: Kim Archer did generally well with her first cycle of chemotherapy.  She had no nausea or vomiting.  She had no mouth sores although her tongue feels raw she says.  She has had some headaches and visual changes.  Her port worked well.  She had no fever, bleeding, rash, or change in bowel or bladder habits.  She did have significant discomfort following the PEG filgrastim shot.  She took some narcotics for that and it helped.  She does complain of altered taste and she says the only thing she feels like eating is [place] fries.  A detailed review of systems today was otherwise stable  COVID 19 VACCINATION STATUS: not vaccinated; infection 10/2019   HISTORY OF CURRENT ILLNESS: From the original intake note:  Kim Archer "Kim Archer" [facility]) was previously seen in the high risk breast cancer clinic on 05/11/2016. She had undergone left lumpectomy on 03/22/2016 showing atypical lobular hyperplasia. She was prescribed tamoxifen at that time, which she believes she took for two years. [She underwent hysterectomy with bilateral salpingo-oophorectomy 03/28/2019, pathology showing leiomyomas and adenomyosis, and may have stopped the tamoxifen around that time].  More recently, she had routine screening mammography on 06/11/2020 showing an area of asymmetry in the right breast. She underwent right diagnostic mammography with tomography and right breast ultrasonography at [facility] on 06/20/2020 showing: breast density category C; 2.4 cm right breast mass at 7 o'clock; one abnormal right axillary lymph node measuring 1.5 cm.  Accordingly on 06/25/2020 she proceeded to biopsy of the right breast area in question. The pathology from this procedure ([id number]) showed:  invasive mammary carcinoma, e-cadherin positive, grade 2 or 3. Prognostic indicators significant for: estrogen receptor, 40% positive and  progesterone receptor, >95% positive, both with strong staining intensity. Proliferation marker Ki67 at 40%. HER2 positive by immunohistochemistry (3+).  Biopsy of the right axillary lymph node performed at the same time showed mammary carcinoma, with no residual lymph node tissue present.  Cancer Staging Malignant neoplasm of lower-outer quadrant of right breast of female, estrogen receptor positive ([place]) Staging form: Breast, AJCC 8th Edition - Clinical stage from 07/02/2020: Stage IB (cT2, cN1, cM0, G3, ER+, PR+, HER2+) - Signed by Chauncey Cruel, MD on 07/02/2020 Stage prefix: Initial diagnosis Histologic grading system: 3 grade system Laterality: Right Staged by: Pathologist and managing physician Stage used in treatment planning: Yes National guidelines used in treatment planning: Yes Type of national guideline used in treatment planning: NCCN  The patient's subsequent history is as detailed below.   PAST MEDICAL HISTORY: Past Medical History:  Diagnosis Date   Allergy    Anemia    due to heavy periods   Atypical ductal hyperplasia of left breast    History of kidney stones    HSV-2 (herpes simplex virus 2) infection    PONV (postoperative nausea and vomiting)    right breast ca 06/2020   Breast cancer    PAST SURGICAL HISTORY: Past Surgical History:  Procedure Laterality Date   BREAST CYST EXCISION Left 03/22/2016   ALH-BENIGN   BREAST LUMPECTOMY WITH RADIOACTIVE SEED LOCALIZATION Left 03/22/2016   Procedure: LEFT BREAST LUMPECTOMY WITH RADIOACTIVE SEED LOCALIZATION;  Surgeon: Autumn Messing III, MD;  Location: [facility];  Service: General;  Laterality: Left;   CESAREAN SECTION     x3   COMBINED HYSTEROSCOPY DIAGNOSTIC / D&C  07/17/2018   EYE SURGERY     lt lens replacement   PORTACATH PLACEMENT Left 07/07/2020   Procedure: INSERTION PORT-A-CATH;  Surgeon: Jovita Kussmaul, MD;  Location: [facility];  Service: General;  Laterality: Left;   TUBAL LIGATION       FAMILY HISTORY: Family History  Problem Relation Age of Onset   Breast cancer Mother    Melanoma Father    Leukemia Sister    Liver cancer Brother        stomach   Breast cancer Maternal Aunt    Colon cancer Neg Hx    Colon polyps Neg Hx    Esophageal cancer Neg Hx    Rectal cancer Neg Hx    Stomach cancer Neg Hx   The patient's father died at age 59. He had a history of melanoma. He has a brother, the patient's uncle, with prostate cancer. The patient's mother died in her 35s from urosepsis. She had a history of breast cancer diagnosed in her 17s. The patient has a brother, with cerebral palsy, who had "stomach cancer" metastatic to the liver. The patient has 2 sisters. One of them died from childhood leukemia.    GYNECOLOGIC HISTORY:  No LMP recorded (lmp unknown). Patient has had a hysterectomy. Menarche: 55 years old Age at first live birth: 55 years old [place] P 3 LMP 03/2019, with hysterectomy Contraceptive: used remotely for many years with no complications HRT never used  Hysterectomy? Yes, 03/2019 (path in Epic) BSO? 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 Her husband [name] (goes by "Kim Archer") is Airline pilot for [employer], which manages apartment complexes. The patient's  daughter Kearney Hard, age 5, works as a Theme park manager in [place]. She has 3 children. The patient's daughter Moody Bruins, age 40, lives in [place]. She, her husband, and their three children live with [place] and Fred. The patient's third child, Quillian Quince "Dalbert Mayotte, 56 years old, also lives in [place] and works in Architect. Patient has in total 8 grandchildren. She is a Psychologist, forensic.    ADVANCED DIRECTIVES: In the absence of any documentation to the contrary, the patient's spouse is their HCPOA.    HEALTH MAINTENANCE: Social History   Tobacco Use   Smoking status: Never   Smokeless  tobacco: Never  Vaping Use   Vaping Use: Never used  Substance Use Topics   Alcohol use: No   Drug use: No     Colonoscopy: 08/2018 (Dr. Ardis Hughs), recall 2030  PAP: 05/2020  Bone density: 05/2019 (Dr. Christen Butter office)   Allergies  Allergen Reactions   Ciprofloxacin Other (See Comments)    Patient came into office with complaints of difficulty swallowing.  Ate at [employer] prior and took Cipro prior   Sulfa Antibiotics Rash    Current Outpatient Medications  Medication Sig Dispense Refill   LORazepam (ATIVAN) 0.5 MG tablet Take 1 tablet (0.5 mg total) by mouth every 8 (eight) hours as needed for anxiety (May increase to 2 tablets if needed). (Patient not taking: Reported on 07/24/2020) 30 tablet 1   Boron 3 MG CAPS Take by mouth.     Calcium 500 MG tablet 1,000 mg.     dexamethasone (DECADRON) 4 MG tablet Take 2 tablets (8 mg total) by mouth 2 (two) times daily. Start the day before Taxotere. Then take daily x 3 days after chemotherapy. 30 tablet 1   Diethylpropion HCl CR 75 MG TB24 diethylpropion ER 75 mg tablet,extended release  TAKE 1 TABLET BY MOUTH EVERY DAY     levocetirizine (XYZAL) 5 MG tablet Take 5 mg by mouth every evening.     loratadine (CLARITIN) 10 MG tablet Take 10 mg by mouth daily.     LUTEIN PO Take by mouth.     MAGNESIUM PO Take 1 tablet by mouth at bedtime.     Menatetrenone (VITAMIN K2) 100 MCG TABS Take by mouth.     Multiple Vitamin (MULTIVITAMIN WITH MINERALS) TABS tablet Take 1 tablet by mouth daily.     prochlorperazine (COMPAZINE) 10 MG tablet Take 1 tablet (10 mg total) by mouth every 6 (six) hours as needed (Nausea or vomiting). 30 tablet 1   Ubiquinol 50 MG CAPS Take by mouth daily.     valACYclovir (VALTREX) 500 MG tablet  (Patient not taking: Reported on 07/24/2020)  12   VITAMIN D PO Vitamin D     zinc sulfate 220 (50 Zn) MG capsule Take 220 mg by mouth daily.     No current facility-administered medications for this visit.    OBJECTIVE: White  woman who appears stated age  54:   08/04/20 1558  BP: 119/61  Pulse: 81  Resp: 17  Temp: 97.9 F (36.6 C)  SpO2: 99%     Body mass index is 26.12 kg/m.   Wt Readings from Last 3 Encounters:  08/04/20 166 lb 12.8 oz (75.7 kg)  07/28/20 169 lb 8 oz (76.9 kg)  07/24/20 171 lb 3.2 oz (77.7 kg)      ECOG FS:1 - Symptomatic but completely ambulatory  Sclerae unicteric, EOMs intact Wearing a mask No cervical or supraclavicular adenopathy Lungs no rales or rhonchi Heart  regular rate and rhythm Abd soft, nontender, positive bowel sounds MSK no focal spinal tenderness, no upper extremity lymphedema Neuro: nonfocal, well oriented, appropriate affect Breasts: I do not palpate a mass in the right breast.  There are no skin or nipple changes of concern.  The left breast and both axillae are benign   LAB RESULTS:  CMP     Component Value Date/Time   NA 145 07/24/2020 0849   NA 142 05/11/2016 1508   K 3.7 07/24/2020 0849   K 4.3 05/11/2016 1508   CL 107 07/24/2020 0849   CO2 28 07/24/2020 0849   CO2 26 05/11/2016 1508   GLUCOSE 74 07/24/2020 0849   GLUCOSE 90 05/11/2016 1508   BUN 17 07/24/2020 0849   BUN 10.9 05/11/2016 1508   CREATININE 0.88 07/24/2020 0849   CREATININE 0.87 07/02/2020 0826   CREATININE 1.0 05/11/2016 1508   CALCIUM 9.7 07/24/2020 0849   CALCIUM 9.5 05/11/2016 1508   PROT 7.4 07/24/2020 0849   PROT 7.0 05/11/2016 1508   ALBUMIN 4.1 07/24/2020 0849   ALBUMIN 3.9 05/11/2016 1508   AST 21 07/24/2020 0849   AST 22 07/02/2020 0826   AST 19 05/11/2016 1508   ALT 20 07/24/2020 0849   ALT 22 07/02/2020 0826   ALT 23 05/11/2016 1508   ALKPHOS 110 07/24/2020 0849   ALKPHOS 98 05/11/2016 1508   BILITOT 0.5 07/24/2020 0849   BILITOT 0.5 07/02/2020 0826   BILITOT 0.36 05/11/2016 1508   GFRNONAA >60 07/24/2020 0849   GFRNONAA >60 07/02/2020 0826    No results found for: TOTALPROTELP, ALBUMINELP, A1GS, A2GS, BETS, BETA2SER, GAMS, MSPIKE, SPEI  Lab  Results  Component Value Date   WBC 30.0 (H) 08/04/2020   NEUTROABS 16.2 (H) 08/04/2020   HGB 13.3 08/04/2020   HCT 40.1 08/04/2020   MCV 92.6 08/04/2020   PLT 166 08/04/2020    No results found for: LABCA2  No components found for: [id number]  No results for input(s): INR in the last 168 hours.  No results found for: LABCA2  No results found for: IRS854  No results found for: OEV035  No results found for: KKX381  No results found for: CA2729  No components found for: HGQUANT  No results found for: CEA1 / No results found for: CEA1   No results found for: AFPTUMOR  No results found for: CHROMOGRNA  No results found for: KPAFRELGTCHN, LAMBDASER, KAPLAMBRATIO (kappa/lambda light chains)  No results found for: HGBA, HGBA2QUANT, HGBFQUANT, HGBSQUAN (Hemoglobinopathy evaluation)   No results found for: LDH  No results found for: IRON, TIBC, IRONPCTSAT (Iron and TIBC)  No results found for: FERRITIN  Urinalysis No results found for: COLORURINE, APPEARANCEUR, LABSPEC, PHURINE, GLUCOSEU, HGBUR, BILIRUBINUR, KETONESUR, PROTEINUR, UROBILINOGEN, NITRITE, LEUKOCYTESUR   STUDIES: CT Chest Wo Contrast  Result Date: 07/23/2020 CLINICAL DATA:  Breast cancer staging. EXAM: CT CHEST WITHOUT CONTRAST TECHNIQUE: Multidetector CT imaging of the chest was performed following the standard protocol without IV contrast. COMPARISON:  No recent comparison. FINDINGS: Cardiovascular: LEFT-sided Port-A-Cath terminates at the caval to atrial junction. Heart size is normal without pericardial effusion. Aortic caliber is normal. Central pulmonary vasculature is normal caliber. Limited assessment of cardiovascular structures given lack of intravenous contrast. Mediastinum/Nodes: Esophagus mildly patulous.  Small hiatal hernia. No thoracic inlet adenopathy. RIGHT axillary adenopathy largest measuring approximately 16 mm. (Image 36/2) RIGHT retropectoral lymph node along the margin of the pectoralis  minor (image 41/2) 11 mm. RIGHT axillary lymph node just lateral to the margin  of the RIGHT pectoralis minor (image 47/2) 13 mm. Biopsy clips in the RIGHT axilla and in the RIGHT breast. No LEFT axillary lymphadenopathy. No internal mammary nodal enlargement. No mediastinal or gross hilar lymphadenopathy. Lungs/Pleura: No consolidation. No pleural effusion. Airways are patent. No suspicious pulmonary nodules. Basilar atelectasis. Upper Abdomen: Areas of hypodensity in the liver, largest area represents a cyst on image 137 of series 2 measuring approximately 4.9 x 3.9 cm. Small area near the dome of the RIGHT hemi liver (image 122/2) 14 mm with intermediate density, not compatible with cysts. Small low-density lesion potentially in hepatic subsegment III (image 159/2) 10 mm. Vague area of low density suspected in LEFT hepatic lobe lateral segment (image 135/2 potentially as large as 2.5 cm though not well evaluated. Pancreas unremarkable with respect to visualized portions as is spleen and adrenal glands. Low-density lesion in the interpolar RIGHT kidney measuring water density likely a small cyst. Musculoskeletal: Numerous tiny foci of lytic/lucent change (image 84/2) 5 mm in the LEFT lateral aspect of T7. Mixed lytic and sclerotic focus in the anterior aspect of T10 measures 1.8 by 1.3 cm. L1 with lytic focus measuring approximately 11 mm (image 68/5) Lytic focus in LEFT scapula (image 83/5) 6 mm. Other small scattered lytic foci. Subtle lesions in T4 and T8 as well. IMPRESSION: 1. RIGHT axillary and retropectoral adenopathy with evidence of prior biopsy in the RIGHT breast and RIGHT axilla. 2. Findings of bony metastatic disease and potential hepatic metastatic disease. PET scan may be helpful for further assessment. Electronically Signed   By: Zetta Bills M.D.   On: 07/23/2020 22:41   NM Bone Scan Whole Body  Result Date: 07/25/2020 CLINICAL DATA:  Breast cancer staging, sternal bone lesion on recent MR  EXAM: NUCLEAR MEDICINE WHOLE BODY BONE SCAN TECHNIQUE: Whole body anterior and posterior images were obtained approximately 3 hours after intravenous injection of radiopharmaceutical. RADIOPHARMACEUTICALS:  21.0 mCi Technetium-57mMDP IV COMPARISON:  None Correlation: CT chest 07/23/2020, MR breast 07/07/2020 FINDINGS: Foci of abnormal tracer uptake are seen within the T10 vertebral body, T8 vertebral body, at midline of lower cervical spine, at superior LEFT manubrium, and the posterior LEFT ninth rib. The sites are suspicious for osseous metastases. The T8 and T10 lesions correspond to abnormalities on CT. The manubrial lesion corresponds to the abnormality seen on MR. Questionable tiny focus of sclerosis in the posterior LEFT ninth rib on CT. No additional sites of worrisome osseous tracer accumulation are identified. Expected urinary tract and soft tissue distribution of tracer. IMPRESSION: Abnormal foci of uptake within T8, T10, midline lower cervical spine, superior LEFT manubrium and posterior LEFT ninth rib suspicious for osseous metastases as above. Electronically Signed   By: MLavonia DanaM.D.   On: 07/25/2020 16:45   MR LIVER W WO CONTRAST  Result Date: 07/28/2020 CLINICAL DATA:  Breast cancer. Indeterminate liver lesion on chest CT. EXAM: MRI ABDOMEN WITHOUT AND WITH CONTRAST TECHNIQUE: Multiplanar multisequence MR imaging of the abdomen was performed both before and after the administration of intravenous contrast. CONTRAST:  7.549mGADAVIST GADOBUTROL 1 MMOL/ML IV SOLN COMPARISON:  Chest CT and whole-body bone scan 07/23/2020. FINDINGS: Lower chest: Mild atelectasis at the right lung base. The known right axillary and subpectoral adenopathy is not well visualized on this study. There are nodular areas of enhancement inferiorly and laterally in the right breast. Hepatobiliary: No appendix steatosis or morphologic changes of cirrhosis. The lesion of concern in the dome of the right hepatic lobe  (segment 7)  measures 1.2 x 1.2 cm on image 8/5. This lesion demonstrates mild T2 hyperintensity, low T1 signal and restricted diffusion. Following contrast, there is fairly homogeneous arterial phase enhancement of the lesion. On delayed post-contrast imaging, there is no contrast washout with the lesion remaining isointense to blood pool. No other suspicious hepatic lesions. There is a 5.3 x 4.2 cm cyst in segment 4A and a 2.4 cm cyst in segment 6. No evidence of gallstones, gallbladder wall thickening or biliary dilatation. Pancreas: Unremarkable. No pancreatic ductal dilatation or surrounding inflammatory changes. Spleen: Normal in size without focal abnormality. Adrenals/Urinary Tract: Both adrenal glands appear normal. Small renal cysts. No evidence of enhancing renal mass or hydronephrosis. Stomach/Bowel: The stomach appears unremarkable for its degree of distension. No evidence of bowel wall thickening, distention or surrounding inflammatory change. Vascular/Lymphatic: There are no enlarged abdominal lymph nodes. No significant vascular findings. Other: No ascites or peritoneal nodularity. Musculoskeletal: There are numerous T2 hyperintense and enhancing osseous lesions involving the thoracolumbar spine and visualized bony pelvis consistent with metastatic disease. The largest lesion is at T10, measuring up to 2.5 cm in diameter. No pathologic fracture or significant epidural tumor identified. IMPRESSION: 1. The lesion of concern in the dome of the right hepatic lobe on recent chest CT is indeterminate, with low T2 hyperintensity, homogeneous arterial phase enhancement and no washout on the delayed images. This could reflect an hepatic adenoma or atypical hemangioma, although metastatic disease is of at least moderate concern. Consider PET CT for further characterization. 2. Multiple metastases within the thoracolumbar spine and visualized bony pelvis. This disease could also be further evaluated with PET-CT.  3. Enhancing right breast lesions. Known right axillary and subpectoral adenopathy not well visualized by this abdominal MR. Electronically Signed   By: Richardean Sale M.D.   On: 07/28/2020 10:26   MR BREAST BILATERAL W WO CONTRAST INC CAD  Result Date: 07/07/2020 CLINICAL DATA:  New diagnosis right breast carcinoma. History of a left lumpectomy in 2018 for atypical cells. Family history breast carcinoma diagnosed in her mother and 2 aunts unknown ages. LABS:  No labs drawn at time of imaging. EXAM: BILATERAL BREAST MRI WITH AND WITHOUT CONTRAST TECHNIQUE: Multiplanar, multisequence MR images of both breasts were obtained prior to and following the intravenous administration of 6 ml of Gadavist Three-dimensional MR images were rendered by post-processing of the original MR data on an independent workstation. The three-dimensional MR images were interpreted, and findings are reported in the following complete MRI report for this study. Three dimensional images were evaluated at the independent interpreting workstation using the DynaCAD thin client. COMPARISON:  Previous exam(s).  No previous breast MRI. FINDINGS: Breast composition: c. Heterogeneous fibroglandular tissue. Background parenchymal enhancement: Mild Right breast: Irregular mass in the lower outer quadrant, with irregular margins and some contiguous non mass enhancement. This represents the recently diagnosed carcinoma demonstrating susceptibility artifact within it from the biopsy marker clip. Mass is centered on image 108/160, series 6, and measures 2.9 x 1.9 x 1.9 cm. BI-RADS 6: Known biopsy-proven malignancy. There is a second enhancing mass, also irregular with irregular margins, but better defined than the index malignancy. This lies more superiorly, in the posterior, lateral right breast, near 9 o'clock, measuring 1.5 x 1.2 x 1.3 cm. BI-RADS 4: Suspicious. No other right breast masses or areas of abnormal enhancement. Left breast: No mass or  abnormal enhancement. Lymph nodes: 3 abnormal level 1 lymph nodes on the right, the largest, 1.3 cm in short axis, contains artifact  from the biopsy marker clip reflecting the known metastatic lymph node. Ancillary findings: Small enhancing lesion in the left upper sternum just below the sternal manubrial joint, 5 mm in size, bright on T2, nonspecific. Areas of asymmetric enhancement along the right internal mammary chain, which appear vascular. No defined internal mammary lymph node. 5.4 cm cyst in the anterior aspect of the liver. IMPRESSION: 1. Irregularly enhancing mass in the lower outer quadrant of the right breast reflects the recently diagnosed breast malignancy, measuring 2.9 x 1.9 x 1.9 cm. 2. Second, suspicious right breast enhancing mass, in the posterolateral breast, above the index malignancy, centered on image 82/160, series 6, measuring 1.5 cm. 3. Three abnormal appearing level 1 right axillary lymph nodes, 1 of which, the largest, reflects the biopsy-proven metastatic lymph node. 4. 5 mm enhancing, T2 signal hyperintense well-defined lesion in the left upper sternum, nonspecific. Metastatic disease is possible. RECOMMENDATION: 1. Targeted right breast ultrasound to try and localize the second smaller enhancing right breast mass in the posterior breast at 9 o'clock, with ultrasound-guided core needle biopsy if the lesion can be visualized. If the mass cannot be visualized sonographically, then MRI guided core needle biopsy would be recommended. BI-RADS CATEGORY  4: Suspicious. Electronically Signed   By: Lajean Manes M.D.   On: 07/07/2020 15:51   NM PET Image Initial (PI) Skull Base To Thigh  Result Date: 08/04/2020 CLINICAL DATA:  Initial treatment strategy for breast cancer. EXAM: NUCLEAR MEDICINE PET SKULL BASE TO THIGH TECHNIQUE: 8.2 mCi F-18 FDG was injected intravenously. Full-ring PET imaging was performed from the skull base to thigh after the radiotracer. CT data was obtained and used  for attenuation correction and anatomic localization. Fasting blood glucose: 80 mg/dl COMPARISON:  Abdominal MRI 07/26/2020.  Chest CT 07/23/2020. FINDINGS: Mediastinal blood pool activity: SUV max 2.2 Liver activity: SUV max NA NECK: No hypermetabolic lymph nodes in the neck. Incidental CT findings: none CHEST: The lateral right breast lesion with biopsy marker shows low level hypermetabolism with SUV max = 2.8. There is hypermetabolic right axillary lymphadenopathy. 10 mm short axis right axillary node on 57/4 demonstrates SUV max = 4.0 12 mm short axis right axillary node on 53/4 demonstrates SUV max = 2.8. No hypermetabolic metastatic lymphadenopathy in the mediastinum or hilar regions. No hypermetabolic disease in the left axilla. No hypermetabolic pulmonary nodule evident Incidental CT findings: Left-sided Port-A-Cath tip is positioned in the distal SVC. ABDOMEN/PELVIS: No abnormal hypermetabolic activity within the liver, pancreas, adrenal glands, or spleen. Specifically, the 12 mm lesion in the dome of the liver evaluated on recent MRI shows no hypermetabolism on PET imaging today. No hypermetabolic lymph nodes in the abdomen or pelvis. Symmetric nonspecific uptake noted in both ovaries. No adnexal mass. Incidental CT findings: Multiple hepatic cysts again noted. SKELETON: Relatively diffuse nodular hypermetabolism throughout the visualized skeletal elements is compatible with the known metastatic disease mainly involving the sternum, thoracolumbar spine and bony pelvis with some mild relative sparing of the ribs. Incidental CT findings: none IMPRESSION: 1. Hypermetabolic lateral right breast lesion with hypermetabolic lymph nodes in the right axilla consistent with metastatic disease. 2. No other evidence for soft tissue metastases on today's study. Specifically, the lesion in the dome of the liver shows no hypermetabolism on today's exam. 3. Relatively widespread nodular hypermetabolic uptake in the bony  anatomy consistent with metastatic involvement. Electronically Signed   By: Misty Stanley M.D.   On: 08/04/2020 10:26   [facility] 1 View  Result  Date: 07/07/2020 CLINICAL DATA:  Postop left Port-A-Cath. EXAM: PORTABLE CHEST 1 VIEW COMPARISON:  None. FINDINGS: Left chest port tip in the lower SVC. No pneumothorax. Lungs are clear. Normal heart size and mediastinal contours. No pulmonary edema. No pleural fluid. No pulmonary mass or dominant nodule. No acute osseous abnormalities are seen. IMPRESSION: Left chest port tip in the lower SVC. No pneumothorax. Electronically Signed   By: Keith Rake M.D.   On: 07/07/2020 17:44   DG Fluoro Guide CV Line-No Report  Result Date: 07/07/2020 Fluoroscopy was utilized by the requesting physician.  No radiographic interpretation.   US BREAST LTD UNI RIGHT INC AXILLA  Result Date: 07/11/2020 CLINICAL DATA:  Biopsy proven invasive mammary carcinoma in the 7 o'clock region of the right breast and metastatic adenopathy. Recent MRI showed a second mass in the 9 o'clock region of the right breast. EXAM: ULTRASOUND OF THE RIGHT BREAST COMPARISON:  Previous exam(s). FINDINGS: Targeted ultrasound is performed, showing an irregular hypoechoic mass in the right breast at 9 o'clock 7 cm from the nipple measuring 1.6 x 1.0 x 1.0 cm. It is suspicious. IMPRESSION: Suspicious mass in the 9 o'clock region of the right breast. RECOMMENDATION: Ultrasound-guided core biopsy of the mass in the 9 o'clock region of the right breast is recommended. The biopsy will be performed dictated separately. I have discussed the findings and recommendations with the patient. If applicable, a reminder letter will be sent to the patient regarding the next appointment. BI-RADS CATEGORY  4: Suspicious. Electronically Signed   By: Lillia Mountain M.D.   On: 07/11/2020 09:32   MM CLIP PLACEMENT RIGHT  Result Date: 07/11/2020 CLINICAL DATA:  Status post ultrasound-guided core biopsy of a mass in  region right breast. EXAM: 3D DIAGNOSTIC RIGHT MAMMOGRAM POST ULTRASOUND BIOPSY COMPARISON:  Previous exam(s). FINDINGS: 3D Mammographic images were obtained following ultrasound guided biopsy of a mass in the 9 o'clock region of the right breast. The biopsy marking clip is in appropriate position in the lateral aspect of the right breast. IMPRESSION: Appropriate positioning of the ribbon shaped shaped biopsy marking clip at the site of biopsy in the lateral aspect of the right breast (9-10 o'clock). Final Assessment: Post Procedure Mammograms for Marker Placement Electronically Signed   By: Lillia Mountain M.D.   On: 07/11/2020 09:38   [place] RT BREAST BX W LOC DEV 1ST LESION IMG BX SPEC US GUIDE  Addendum Date: 07/16/2020   ADDENDUM REPORT: 07/15/2020 15:14 ADDENDUM: Pathology revealed GRADE II INVASIVE MAMMARY CARCINOMA of the RIGHT breast, 9 o'clock. This was found to be concordant by Dr. Lillia Mountain. Pathology results were discussed with the patient by telephone. The patient reported doing well after the biopsy with tenderness at the site. Post biopsy instructions and care were reviewed and questions were answered. The patient was encouraged to call [facility] for any additional concerns. The patient has a recent diagnosis of right breast cancer and should follow her outlined treatment plan. Pathology results reported by Stacie Acres RN on 07/15/2020. Electronically Signed   By: Lillia Mountain M.D.   On: 07/15/2020 15:14   Result Date: 07/16/2020 CLINICAL DATA:  Biopsy proven invasive mammary carcinoma in the 7 o'clock region of the right breast and metastatic axillary adenopathy. Breast MRI showed additional mass in the 9 o'clock region of the breast. EXAM: ULTRASOUND GUIDED RIGHT BREAST CORE NEEDLE BIOPSY COMPARISON:  Previous exam(s). PROCEDURE: I met with the patient and we discussed the procedure of ultrasound-guided  biopsy, including benefits and alternatives. We discussed the high  likelihood of a successful procedure. We discussed the risks of the procedure, including infection, bleeding, tissue injury, clip migration, and inadequate sampling. Informed written consent was given. The usual time-out protocol was performed immediately prior to the procedure. Lesion quadrant: 9 o'clock Using sterile technique and 1% lidocaine and 1% lidocaine with epinephrine as local anesthetic, under direct ultrasound visualization, a 14 gauge spring-loaded device was used to perform biopsy of a mass in the 9 o'clock region of the right breast using an inferior to superior approach. At the conclusion of the procedure ribbon shaped tissue marker clip was deployed into the biopsy cavity. Follow up 2 view mammogram was performed and dictated separately. IMPRESSION: Ultrasound guided biopsy of the right breast. No apparent complications. Electronically Signed: By: Lillia Mountain M.D. On: 07/11/2020 09:27      ELIGIBLE FOR AVAILABLE RESEARCH PROTOCOL: No  ASSESSMENT: 55 y.o. 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  (A) breast MRI 07/07/2020 shows mcT2 N1 disease as well as an enhancing lesionin the sternum (B) non-contrast Chest CT scan 07/23/2020 shows right axillary and retropetoral adenopathy, also several liver hypodensities and very small  lytic/sclerotic bone lesions  (C) bone scan 07/25/2020 shows abnormal uptake at T8, T10, midline lower cervical spine, superior LEFT manubrium and posterior LEFT ninth rib  (D) abdominal MRI with and w/o conttrast 07/26/2020 shows a right hepatic 1.2 cm indeterminate lesion, no other lesions of concern in the liver; bone lesions re-demonstrated, larget at T10  (E) PET scan 08/01/2020 shows no hypermetabolism in the 1.2 cm right liver lesion; bony hypermetabolism consistent with known mets noted  (F) biopsy T10 to be performed at the end of chemotherapy  (G) CA 27-29 on 08/19/2020  (5) neoadjuvant chemotherapy consisting of docetaxel, carboplatin, trastuzumab and pertuzumab every 21 days x 4-6 cycles 8 started 07/28/2020  (6) anti-HER2 treatment to be continued indefinitely  (a) echocardiogram on 07/03/2020 shows EF of 55-60%  (7) anastrozole/ abemaciclib to start at the completion of chemotherapy   PLAN: Kim Archer did generally well with her first cycle of chemotherapy.  Of course that was standard of care for treatment with intent to cure her in triple positive disease.  The question is how to proceed from here.  She understands that stage IV breast cancer is not curable with our current knowledge base. The goal of treatment is control. The strategy of treatment is to do only the minimum necessary to control the growth of the tumor so that the patient can have as normal a life as possible. There is no survival advantage in treating aggressively if treating less aggressively results in tumor control. With this strategy stage IV breast cancer in many cases can function as a "chronic illness": something that cannot be quite gotten rid of but can be controlled for an indefinite period of time  The  staging studies showed no visceral disease,, though there are multiple bony lesions.  The only measurable disease is in the breast and regional lymph nodes.  While we could switch to antiestrogens and  anti-HER2 treatment without chemotherapy at this point, she has already received her first cycle of standard CTTP and is going to lose her hair anyway.  For that reason I would favor continuing with the current treatment for at least 4 cycles.  At the end of 4 cycles we can obtain a repeat breast MRI and if we are at or near a complete radiologic response we would likely stop chemo at that point and switch to maintenance antiestrogens and anti-HER2 treatment which could be continued indefinitely.  She understands we normally do biopsy stage IV disease at least once.  I am going to wait until after the end of chemo d to do a biopsy of T10.  That we will give [place] not only confirmation of metastatic disease but also information regarding the status of the cancer peripherally.  They understand we do not normally proceed to lumpectomy sentinel lymph node sampling and radiation in the metastatic setting although that can be considered if local control appears to be in jeopardy.  We discussed [facility] See and they have a good understanding of the possible toxicity side effects and complications including the rare cases of osteonecrosis of the jaw.  Am concerned that she is having some headaches and visual changes.  Up to 20% of patients were HER2 positive eventually developed brain metastases.  Going to set her up for a brain MRI.  She is agreeable to this.  She will see me again 08/19/2020.  As she has expressed some interest in possibly moving further treatments to Thursday so she can continue to work during the week of treatment and she will tell me her decision at that time  Total encounter time 45 minutes.Sarajane Jews C. Tanvi Gatling, MD 08/04/20 5:15 PM Medical Oncology and Hematology [facility] [place], [place] [address] Tel. [phone]    Fax. [phone]   I, Wilburn Mylar, am acting as scribe for Dr. Virgie Dad. Hassani Sliney.     *Total Encounter Time as defined by the  Centers for Medicare and Medicaid Services includes, in addition to the face-to-face time of a patient visit (documented in the note above) non-face-to-face time: obtaining and reviewing outside history, ordering and reviewing medications, tests or procedures, care coordination (communications with other health care professionals or caregivers) and documentation in the medical record.

## 2020-08-04 ENCOUNTER — Inpatient Hospital Stay: Payer: 59

## 2020-08-04 ENCOUNTER — Other Ambulatory Visit: Payer: Self-pay

## 2020-08-04 ENCOUNTER — Inpatient Hospital Stay (HOSPITAL_BASED_OUTPATIENT_CLINIC_OR_DEPARTMENT_OTHER): Payer: 59 | Admitting: Oncology

## 2020-08-04 VITALS — BP 119/61 | HR 81 | Temp 97.9°F | Resp 17 | Wt 166.8 lb

## 2020-08-04 DIAGNOSIS — Z170 Estrogen receptor positive status [ER+]: Secondary | ICD-10-CM | POA: Diagnosis not present

## 2020-08-04 DIAGNOSIS — Z7189 Other specified counseling: Secondary | ICD-10-CM | POA: Diagnosis not present

## 2020-08-04 DIAGNOSIS — C50511 Malignant neoplasm of lower-outer quadrant of right female breast: Secondary | ICD-10-CM

## 2020-08-04 DIAGNOSIS — Z95828 Presence of other vascular implants and grafts: Secondary | ICD-10-CM

## 2020-08-04 DIAGNOSIS — Z5112 Encounter for antineoplastic immunotherapy: Secondary | ICD-10-CM | POA: Diagnosis not present

## 2020-08-04 DIAGNOSIS — C7951 Secondary malignant neoplasm of bone: Secondary | ICD-10-CM | POA: Diagnosis not present

## 2020-08-04 LAB — CMP (CANCER CENTER ONLY)
ALT: 28 U/L (ref 0–44)
AST: 29 U/L (ref 15–41)
Albumin: 3.9 g/dL (ref 3.5–5.0)
Alkaline Phosphatase: 119 U/L (ref 38–126)
Anion gap: 7 (ref 5–15)
BUN: 14 mg/dL (ref 6–20)
CO2: 27 mmol/L (ref 22–32)
Calcium: 9.4 mg/dL (ref 8.9–10.3)
Chloride: 104 mmol/L (ref 98–111)
Creatinine: 0.77 mg/dL (ref 0.44–1.00)
GFR, Estimated: >60 mL/min (ref >60)
Glucose, Bld: 96 mg/dL (ref 70–99)
Potassium: 4.2 mmol/L (ref 3.5–5.1)
Sodium: 138 mmol/L (ref 135–145)
Total Bilirubin: 0.3 mg/dL (ref 0.3–1.2)
Total Protein: 6.6 g/dL (ref 6.5–8.1)

## 2020-08-04 LAB — CBC WITH DIFFERENTIAL/PLATELET
Abs Immature Granulocytes: 4.2 10*3/uL — ABNORMAL HIGH (ref 0.00–0.07)
Band Neutrophils: 23 %
Basophils Absolute: 0 10*3/uL (ref 0.0–0.1)
Basophils Relative: 0 %
Blasts: 1 %
Eosinophils Absolute: 0 10*3/uL (ref 0.0–0.5)
Eosinophils Relative: 0 %
HCT: 40.1 % (ref 36.0–46.0)
Hemoglobin: 13.3 g/dL (ref 12.0–15.0)
Lymphocytes Relative: 22 %
Lymphs Abs: 6.6 10*3/uL — ABNORMAL HIGH (ref 0.7–4.0)
MCH: 30.7 pg (ref 26.0–34.0)
MCHC: 33.2 g/dL (ref 30.0–36.0)
MCV: 92.6 fL (ref 80.0–100.0)
Metamyelocytes Relative: 13 %
Monocytes Absolute: 2.7 10*3/uL — ABNORMAL HIGH (ref 0.1–1.0)
Monocytes Relative: 9 %
Myelocytes: 1 %
Neutro Abs: 16.2 10*3/uL — ABNORMAL HIGH (ref 1.7–7.7)
Neutrophils Relative %: 31 %
Platelets: 166 10*3/uL (ref 150–400)
RBC: 4.33 MIL/uL (ref 3.87–5.11)
RDW: 12.4 % (ref 11.5–15.5)
WBC: 30 10*3/uL — ABNORMAL HIGH (ref 4.0–10.5)
nRBC: 0.1 % (ref 0.0–0.2)

## 2020-08-04 MED ORDER — PROCHLORPERAZINE MALEATE 10 MG PO TABS: 10.0000 mg | ORAL_TABLET | Freq: Four times a day (QID) | ORAL | 1 refills | Status: DC | PRN

## 2020-08-04 MED ORDER — DEXAMETHASONE 4 MG PO TABS: 8.0000 mg | ORAL_TABLET | Freq: Two times a day (BID) | ORAL | 1 refills | Status: DC

## 2020-08-04 MED ORDER — SODIUM CHLORIDE 0.9% FLUSH
10.0000 mL | INTRAVENOUS | Status: AC | PRN
Start: 2020-08-04 — End: 2020-08-04
  Administered 2020-08-04: 10 mL
  Filled 2020-08-04: qty 10

## 2020-08-04 MED ORDER — HEPARIN SOD (PORK) LOCK FLUSH 100 UNIT/ML IV SOLN
500.0000 [IU] | INTRAVENOUS | Status: AC | PRN
Start: 2020-08-04 — End: 2020-08-04
  Administered 2020-08-04: 500 [IU]
  Filled 2020-08-04: qty 5

## 2020-08-04 MED ORDER — LIDOCAINE-PRILOCAINE 2.5-2.5 % EX CREA: TOPICAL_CREAM | CUTANEOUS | 3 refills | Status: DC

## 2020-08-04 NOTE — Progress Notes (Signed)
DISCONTINUE ON PATHWAY REGIMEN - Breast     A cycle is every 21 days:     Pertuzumab      Pertuzumab      Trastuzumab-xxxx      Trastuzumab-xxxx      Carboplatin      Docetaxel   **Always confirm dose/schedule in your pharmacy ordering system**  REASON: Other Reason PRIOR TREATMENT: [id number]: Docetaxel + Carboplatin + Trastuzumab IV + Pertuzumab IV (TCHP IV) q21 Days x 6 Cycles TREATMENT RESPONSE: Unable to Evaluate  START ON PATHWAY REGIMEN - Breast     Cycle 1: A cycle is 21 days:     Pertuzumab      Trastuzumab-xxxx      Paclitaxel    Cycles 2 through 8: A cycle is every 21 days:     Pertuzumab      Trastuzumab-xxxx      Paclitaxel    Cycles 9 and beyond: A cycle is every 21 days:     Pertuzumab      Trastuzumab-xxxx   **Always confirm dose/schedule in your pharmacy ordering system**  Patient Characteristics: Distant Metastases or Locoregional Recurrent Disease - Unresected or Locally Advanced Unresectable Disease Progressing after Neoadjuvant and Local Therapies, HER2 Positive, ER Positive, Chemotherapy + HER2-Targeted Therapy, First Line Therapeutic Status: Distant Metastases ER Status: Positive (+) HER2 Status: Positive (+) PR Status: Positive (+) Line of Therapy: First Line Intent of Therapy: Non-Curative / Palliative Intent, Discussed with Patient

## 2020-08-05 ENCOUNTER — Encounter: Payer: Self-pay | Admitting: *Deleted

## 2020-08-11 NOTE — Progress Notes (Signed)
Pharmacist Chemotherapy Monitoring - Initial Assessment    Anticipated start date: 08/19/20   Regimen:  Are orders appropriate based on the patient's diagnosis, regimen, and cycle? Yes Does the plan date match the patient's scheduled date? Yes Is the sequencing of drugs appropriate? Yes Are the premedications appropriate for the patient's regimen? Yes Prior Authorization for treatment is: Not Started If applicable, is the correct biosimilar selected based on the patient's insurance? yes  Organ Function and Labs: Are dose adjustments needed based on the patient's renal function, hepatic function, or hematologic function? Yes Are appropriate labs ordered prior to the start of patient's treatment? Yes Other organ system assessment, if indicated: trastuzumab: Echo/ MUGA and pertuzumab: Echo/ MUGA The following baseline labs, if indicated, have been ordered: N/A  Dose Assessment: Are the drug doses appropriate? yes Are the following correct: Drug concentrations Yes IV fluid compatible with drug Yes Administration routes Yes Timing of therapy Yes If applicable, does the patient have documented access for treatment and/or plans for port-a-cath placement? yes If applicable, have lifetime cumulative doses been properly documented and assessed? yes Lifetime Dose Tracking   Carboplatin: 570 mg = 0.01 % of the maximum lifetime dose of 999,999,999 mg    Toxicity Monitoring/Prevention: The patient has the following take home antiemetics prescribed: Prochlorperazine and Dexamethasone The patient has the following take home medications prescribed: N/A Medication allergies and previous infusion related reactions, if applicable, have been reviewed and addressed. No The patient's current medication list has been assessed for drug-drug interactions with their chemotherapy regimen. no significant drug-drug interactions were identified on review.  Order Review: Are the treatment plan orders signed?  Yes Is the patient scheduled to see a provider prior to their treatment? Yes  I verify that I have reviewed each item in the above checklist and answered each question accordingly.  Larene Beach, RPH, 08/11/2020  4:31 PM

## 2020-08-12 ENCOUNTER — Other Ambulatory Visit: Payer: Self-pay

## 2020-08-12 ENCOUNTER — Ambulatory Visit (HOSPITAL_COMMUNITY)
Admission: RE | Admit: 2020-08-12 | Discharge: 2020-08-12 | Disposition: A | Discharge status: Home / Self Care | Payer: 59 | Source: Ambulatory Visit | Attending: Oncology | Admitting: Oncology

## 2020-08-12 DIAGNOSIS — C7951 Secondary malignant neoplasm of bone: Secondary | ICD-10-CM | POA: Diagnosis present

## 2020-08-12 DIAGNOSIS — Z7189 Other specified counseling: Secondary | ICD-10-CM | POA: Diagnosis present

## 2020-08-12 DIAGNOSIS — C50511 Malignant neoplasm of lower-outer quadrant of right female breast: Secondary | ICD-10-CM | POA: Diagnosis present

## 2020-08-12 DIAGNOSIS — Z170 Estrogen receptor positive status [ER+]: Secondary | ICD-10-CM | POA: Insufficient documentation

## 2020-08-12 IMAGING — MR MR HEAD WO/W CM
13 series · 48 of 48 positions shown · IV contrast (gadavist)
Comparison: None.

CLINICAL DATA: Breast cancer staging.  Headache.

EXAM:
MRI HEAD WITHOUT AND WITH CONTRAST
TECHNIQUE: Multiplanar, multiecho pulse sequences of the brain and surrounding
structures were obtained without and with intravenous contrast.
CONTRAST:  7mL GADAVIST GADOBUTROL 1 MMOL/ML IV SOLN

[Series 5: DWI · axial · 3.0mm · 1.36mm/px · z∈[-104,+48]mm · 6 of 104 slices shown (1 of 2)]
[im 1/104]
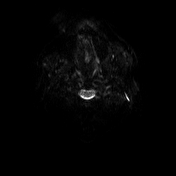
[im 21/104]
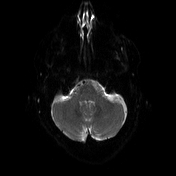
[im 42/104]
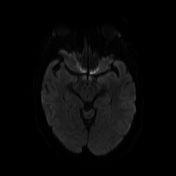
[im 62/104]
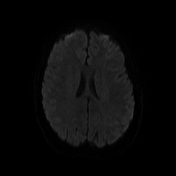
[im 83/104]
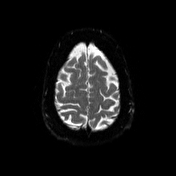
[im 104/104]
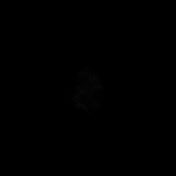

[Series 6: DWI · axial · 3.0mm · 1.36mm/px · z∈[-104,+48]mm · 3 of 52 slices shown (2 of 2)]
[im 1/52]
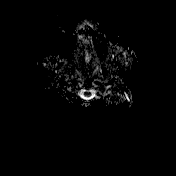
[im 26/52]
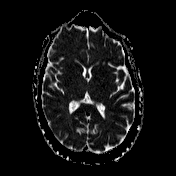
[im 52/52]
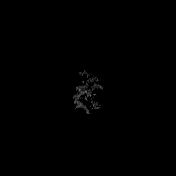

[Series 7: T1 · sagittal · 5.0mm · 0.75mm/px · 1 of 24 slices shown (1 of 2)]
[im 1/24]
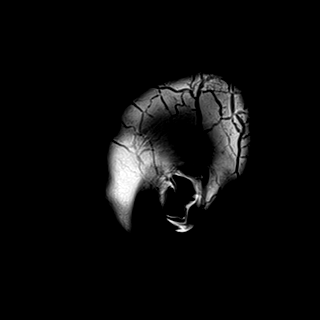

[Series 8: T2 · axial · 5.0mm · 0.62mm/px · z∈[-106,+55]mm · 2 of 26 slices shown]
[im 1/26]
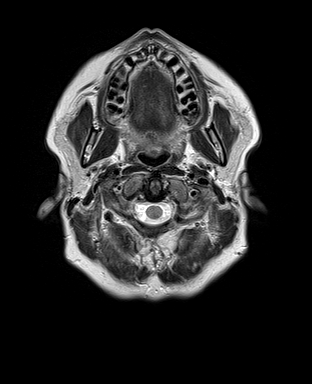
[im 26/26]
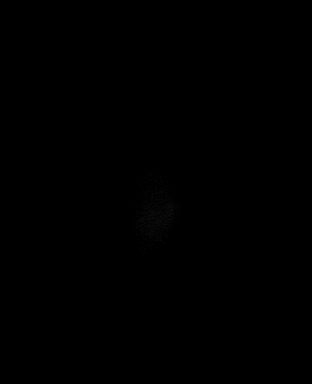

[Series 9: swi_images · axial · 3.0mm · 0.75mm/px · z∈[-108,+57]mm · 3 of 56 slices shown]
[im 1/56]
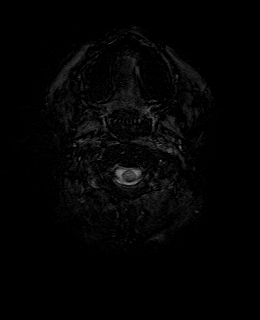
[im 28/56]
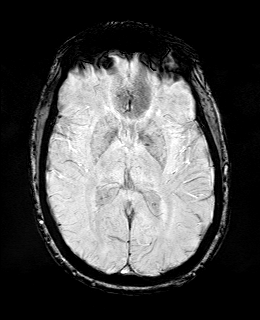
[im 56/56]
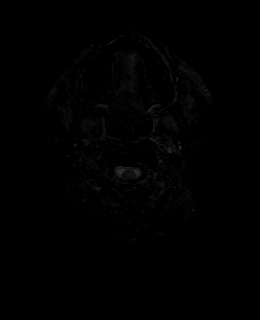

[Series 11: FLAIR · axial · 3.0mm · 0.75mm/px · z∈[-102,+51]mm · 3 of 52 slices shown]
[im 1/52]
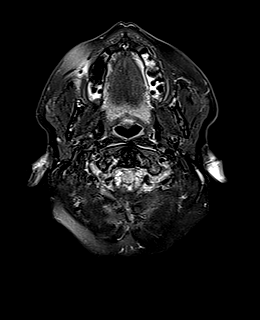
[im 26/52]
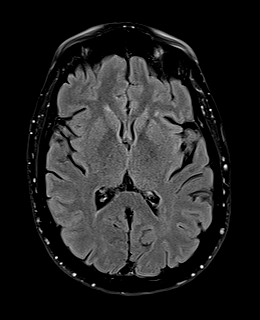
[im 52/52]
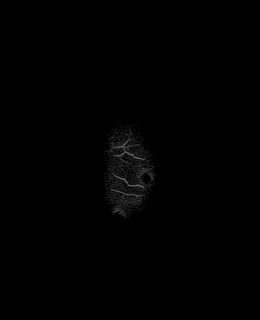

[Series 12: T1 · axial · 1.0mm · 0.94mm/px · z∈[-105,+54]mm · 10 of 160 slices shown (2 of 2)]
[im 1/160]
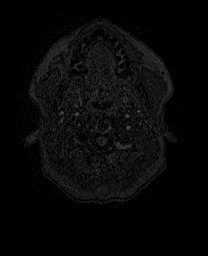
[im 18/160]
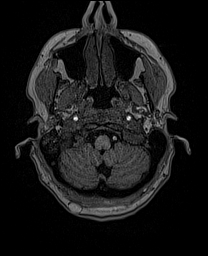
[im 36/160]
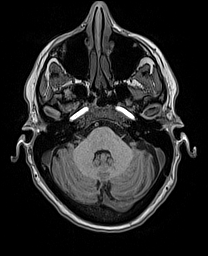
[im 54/160]
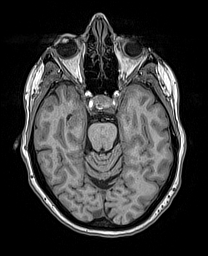
[im 71/160]
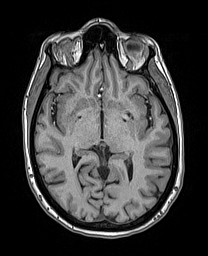
[im 89/160]
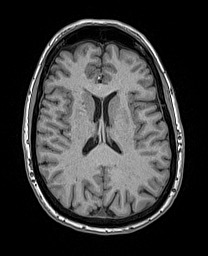
[im 107/160]
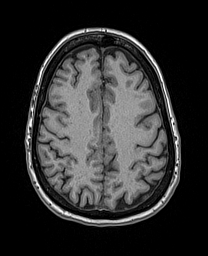
[im 124/160]
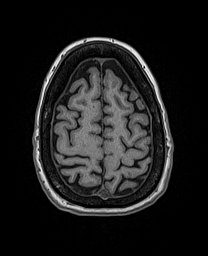
[im 142/160]
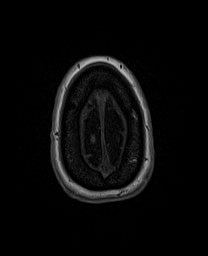
[im 160/160]
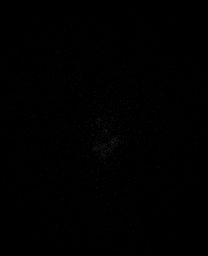

[Series 13: cor dwi_tracew · coronal · 5.0mm · 1.53mm/px · 3 of 56 slices shown]
[im 1/56]
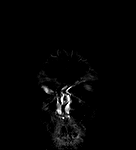
[im 28/56]
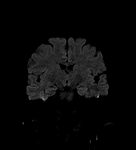
[im 56/56]
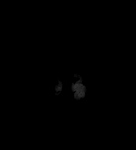

[Series 14: cor dwi_adc · coronal · 5.0mm · 1.53mm/px · 2 of 28 slices shown]
[im 1/28]
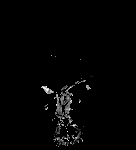
[im 28/28]
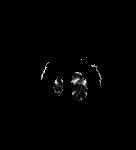

[Series 15: T2 post-contrast · coronal · 5.0mm · 0.57mm/px · 2 of 29 slices shown]
[im 1/29]
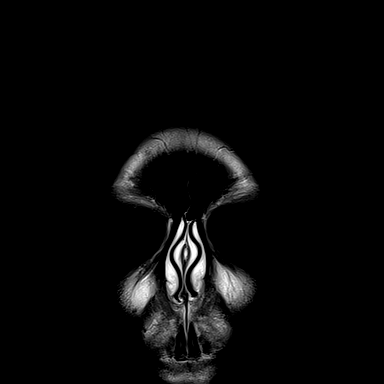
[im 29/29]
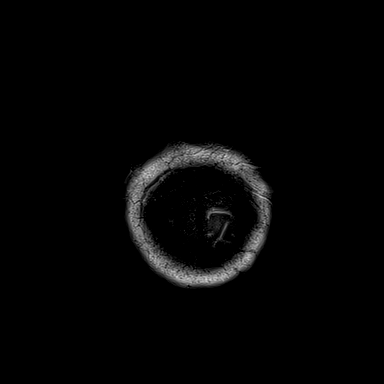

[Series 16: T1 post-contrast · axial · 1.0mm · 0.94mm/px · z∈[-105,+54]mm · 10 of 160 slices shown (1 of 3)]
[im 1/160]
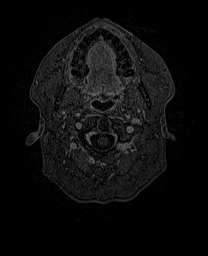
[im 18/160]
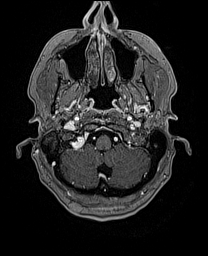
[im 36/160]
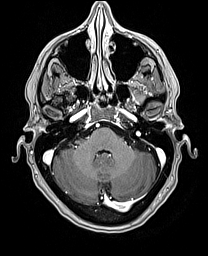
[im 54/160]
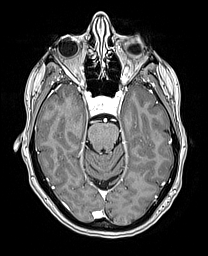
[im 71/160]
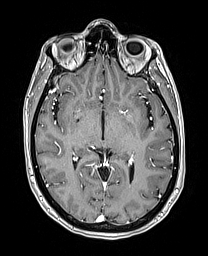
[im 89/160]
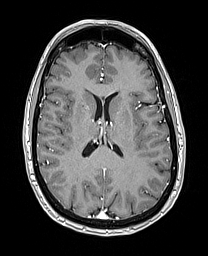
[im 107/160]
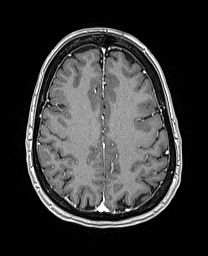
[im 124/160]
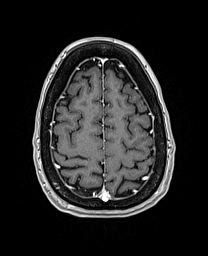
[im 142/160]
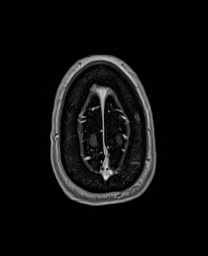
[im 160/160]
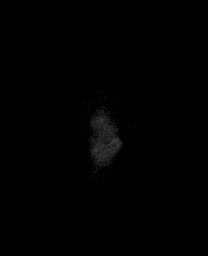

[Series 17: T1 post-contrast · coronal · 5.0mm · 0.43mm/px · 2 of 29 slices shown (2 of 3)]
[im 1/29]
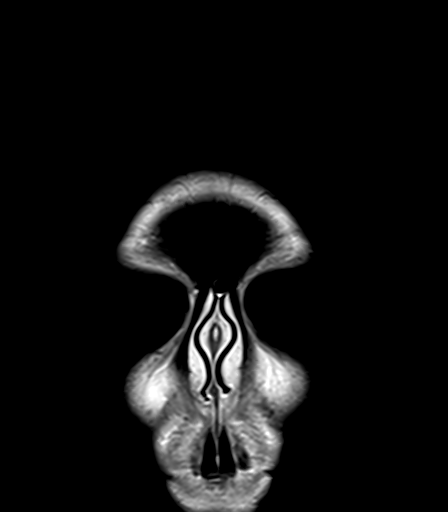
[im 29/29]
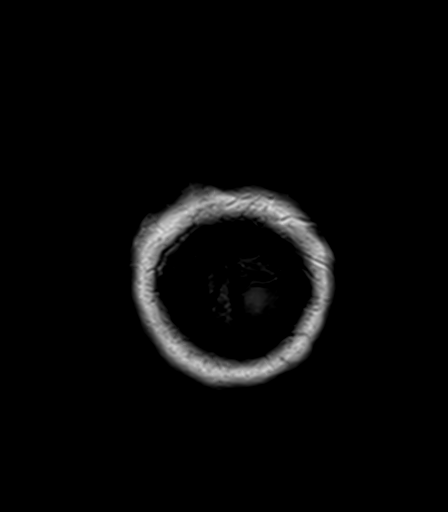

[Series 18: T1 post-contrast · sagittal · 5.0mm · 0.75mm/px · 1 of 24 slices shown (3 of 3)]
[im 1/24]
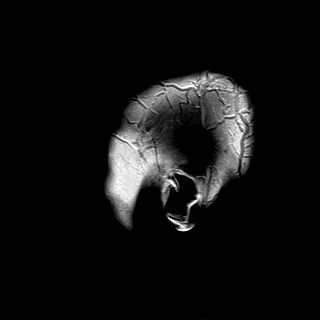

[48 of 48 positions shown; findings below may reference images not displayed]

FINDINGS: Brain: Negative for metastatic disease.  No enhancing lesions.

Ventricle size and cerebral volume normal. Few small deep white
matter hyperintensities bilaterally. Negative for acute infarct or
hemorrhage.

Vascular: Normal arterial flow voids

Skull and upper cervical spine: No focal skeletal lesion.

Sinuses/Orbits: Negative

Other: Multiple round well-circumscribed scalp lesion most likely
KANWAL cysts.
IMPRESSION: Negative for metastatic disease to the brain.

## 2020-08-12 MED ORDER — GADOBUTROL 1 MMOL/ML IV SOLN
7.0000 mL | Freq: Once | INTRAVENOUS | Status: AC | PRN
  Administered 2020-08-12: 7 mL via INTRAVENOUS

## 2020-08-15 MED FILL — Dexamethasone Sodium Phosphate Inj 100 MG/10ML: INTRAMUSCULAR | Qty: 1 | Status: AC

## 2020-08-15 MED FILL — Dexamethasone Sodium Phosphate Inj 100 MG/10ML: INTRAMUSCULAR | Qty: 1 | Status: CN

## 2020-08-15 MED FILL — Fosaprepitant Dimeglumine For IV Infusion 150 MG (Base Eq): INTRAVENOUS | Qty: 5 | Status: AC

## 2020-08-18 NOTE — Progress Notes (Addendum)
[place]  [phone] [phone]     ID: Kim Archer DOB: Jan 25, 1966  MR#: [id number]  SEG#:[id number]  Patient Care Team: Kim Queen, MD as PCP - General (Obstetrics and Gynecology) Kim Archer, Kim Dad, MD as Consulting Physician (Oncology) Kim Kussmaul, MD as Consulting Physician (General Surgery) Kim Queen, MD as Consulting Physician (Obstetrics and Gynecology) Kim Kaufmann, RN as Oncology Nurse Navigator Kim Germany, RN as Oncology Nurse Navigator Kim Kussmaul, MD as Consulting Physician (General Surgery) Kim Archer, Kim Dad, MD as Consulting Physician (Oncology) Kim Pray, MD as Consulting Physician (Radiation Oncology) Kim Cruel, MD OTHER MD:  CHIEF COMPLAINT: Triple positive breast cancer  CURRENT TREATMENT: Neoadjuvant chemotherapy; denosumab/Xgeva   INTERVAL HISTORY: Kim Archer returns today for follow up and treatment of her triple positive breast cancer.  She is accompanied by her husband Kim Archer  Since her last visit, she underwent staging brain MRI on 08/12/2020 showing: no metastatic disease to the brain.  She began neoadjuvant chemotherapy, consisting of Docetaxel, Carboplatin, trastuzumab and Pertuzumab given on day 1 of a 21 day cycle with Neulasta or biosimilar given on day 3, on 07/28/2020.  She had an excellent nadir count of 30,000 with 16.2 neutrophils.  She is here today for cycle 2-day 1.  Her most recent echocardiogram was completed on 07/03/2020 showing and ejection fraction of 55-60%.  She will have her first Xgeva dose together with chemotherapy today.  We will repeat the Xgeva every 6 weeks while she is receiving her chemotherapy to avoid excessive visits.   REVIEW OF SYSTEMS: Kim Archer was able to continue to work right through her first cycle of chemotherapy.  She felt a little weak a couple of times and had to leave early 1 day but otherwise she continues to function pretty normally,  did a lot of gardening this last weekend.  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  She had several loose bowel movements, and she did not take any Imodium or colestipol.  Her hair is coming out in clumps.  She does have a wig available but she is wearing a hat today.  A detailed review of systems today was otherwise stable  COVID 19 VACCINATION STATUS: not vaccinated; infection 10/2019   HISTORY OF CURRENT ILLNESS: From the original intake note:  Kim Archer "Kim Archer" [facility]) was previously seen in the high risk breast cancer clinic on 05/11/2016. She had undergone left lumpectomy on 03/22/2016 showing atypical lobular hyperplasia. She was prescribed tamoxifen at that time, which she believes she took for two years. [She underwent hysterectomy with bilateral salpingo-oophorectomy 03/28/2019, pathology showing leiomyomas and adenomyosis, and may have stopped the tamoxifen around that time].  More recently, she had routine screening mammography on 06/11/2020 showing an area of asymmetry in the right breast. She underwent right diagnostic mammography with tomography and right breast ultrasonography at [facility] on 06/20/2020 showing: breast density category C; 2.4 cm right breast mass at 7 o'clock; one abnormal right axillary lymph node measuring 1.5 cm.  Accordingly on 06/25/2020 she proceeded to biopsy of the right breast area in question. The pathology from this procedure ([id number]) showed: invasive mammary carcinoma, e-cadherin positive, grade 2 or 3. Prognostic indicators significant for: estrogen receptor, 40% positive and progesterone receptor, >95% positive, both with strong staining intensity. Proliferation marker Ki67 at 40%. HER2 positive by immunohistochemistry (3+).  Biopsy of the right  axillary lymph node performed at the same time showed mammary carcinoma, with no residual lymph node tissue  present.  Cancer Staging Malignant neoplasm of lower-outer quadrant of right breast of female, estrogen receptor positive ([place]) Staging form: Breast, AJCC 8th Edition - Clinical stage from 07/02/2020: Stage IV (cT2, cN1, cM1, G3, ER+, PR+, HER2+) - Signed by Kim Cruel, MD on 08/04/2020 Stage prefix: Initial diagnosis Histologic grading system: 3 grade system Laterality: Right Staged by: Pathologist and managing physician Stage used in treatment planning: Yes National guidelines used in treatment planning: Yes Type of national guideline used in treatment planning: NCCN  The patient's subsequent history is as detailed below.   PAST MEDICAL HISTORY: Past Medical History:  Diagnosis Date   Allergy    Anemia    due to heavy periods   Atypical ductal hyperplasia of left breast    History of kidney stones    HSV-2 (herpes simplex virus 2) infection    PONV (postoperative nausea and vomiting)    right breast ca 06/2020   Breast cancer    PAST SURGICAL HISTORY: Past Surgical History:  Procedure Laterality Date   BREAST CYST EXCISION Left 03/22/2016   ALH-BENIGN   BREAST LUMPECTOMY WITH RADIOACTIVE SEED LOCALIZATION Left 03/22/2016   Procedure: LEFT BREAST LUMPECTOMY WITH RADIOACTIVE SEED LOCALIZATION;  Surgeon: Kim Messing III, MD;  Location: [place];  Service: General;  Laterality: Left;   CESAREAN SECTION     x3   COMBINED HYSTEROSCOPY DIAGNOSTIC / D&C  07/17/2018   EYE SURGERY     lt lens replacement   PORTACATH PLACEMENT Left 07/07/2020   Procedure: INSERTION PORT-A-CATH;  Surgeon: Kim Kussmaul, MD;  Location: [place];  Service: General;  Laterality: Left;   TUBAL LIGATION      FAMILY HISTORY: Family History  Problem Relation Age of Onset   Breast cancer Mother    Melanoma Father    Leukemia Sister    Liver cancer Brother        stomach   Breast cancer Maternal Aunt    Colon cancer Neg Hx    Colon polyps Neg Hx    Esophageal cancer Neg Hx     Rectal cancer Neg Hx    Stomach cancer Neg Hx   The patient's father died at age 20. He had a history of melanoma. He has a brother, the patient's uncle, with prostate cancer. The patient's mother died in her 40s from urosepsis. She had a history of breast cancer diagnosed in her 46s. The patient has a brother, with cerebral palsy, who had "stomach cancer" metastatic to the liver. The patient has 2 sisters. One of them died from childhood leukemia.    GYNECOLOGIC HISTORY:  No LMP recorded (lmp unknown). Patient has had a hysterectomy. Menarche: 55 years old Age at first live birth: 55 years old [place] P 3 LMP 03/2019, with hysterectomy Contraceptive: used remotely for many years with no complications HRT never used  Hysterectomy? Yes, 03/2019 (path in Epic) BSO? 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 Her husband [name] (goes by "Kim Archer") is Airline pilot for [employer], which manages apartment complexes. The patient's daughter Kearney Hard, age 40, works as a Theme park manager in [place]. She has 3 children. The patient's daughter Moody Bruins, age 5, lives in [place]. She, her husband, and their three children live with [place] and Fred. The patient's third child, Quillian Quince "  Dalbert Mayotte, 56 years old, also lives in [place] and works in Architect. Patient has in total 8 grandchildren. She is a Psychologist, forensic.    ADVANCED DIRECTIVES: In the absence of any documentation to the contrary, the patient's spouse is their HCPOA.    HEALTH MAINTENANCE: Social History   Tobacco Use   Smoking status: Never   Smokeless tobacco: Never  Vaping Use   Vaping Use: Never used  Substance Use Topics   Alcohol use: No   Drug use: No     Colonoscopy: 08/2018 (Dr. Ardis Hughs), recall 2030  PAP: 05/2020  Bone density: 05/2019 (Dr. Christen Butter office)   Allergies  Allergen Reactions   Ciprofloxacin Other (See  Comments)    Patient came into office with complaints of difficulty swallowing.  Ate at [employer] prior and took Cipro prior   Sulfa Antibiotics Rash    Current Outpatient Medications  Medication Sig Dispense Refill   LORazepam (ATIVAN) 0.5 MG tablet Take 1 tablet (0.5 mg total) by mouth every 8 (eight) hours as needed for anxiety (May increase to 2 tablets if needed). (Patient not taking: Reported on 07/24/2020) 30 tablet 1   Boron 3 MG CAPS Take by mouth.     Calcium 500 MG tablet 1,000 mg.     dexamethasone (DECADRON) 4 MG tablet Take 2 tablets (8 mg total) by mouth 2 (two) times daily. Start the day before Taxotere. Then take daily x 3 days after chemotherapy. 30 tablet 1   dexamethasone (DECADRON) 4 MG tablet Take 2 tablets (8 mg total) by mouth 2 (two) times daily. Start the day before Taxotere. Then take daily x 3 days after chemotherapy. 30 tablet 1   Diethylpropion HCl CR 75 MG TB24 diethylpropion ER 75 mg tablet,extended release  TAKE 1 TABLET BY MOUTH EVERY DAY     levocetirizine (XYZAL) 5 MG tablet Take 5 mg by mouth every evening.     lidocaine-prilocaine (EMLA) cream Apply to affected area once 30 g 3   loratadine (CLARITIN) 10 MG tablet Take 10 mg by mouth daily.     LUTEIN PO Take by mouth.     MAGNESIUM PO Take 1 tablet by mouth at bedtime.     Menatetrenone (VITAMIN K2) 100 MCG TABS Take by mouth.     Multiple Vitamin (MULTIVITAMIN WITH MINERALS) TABS tablet Take 1 tablet by mouth daily.     prochlorperazine (COMPAZINE) 10 MG tablet Take 1 tablet (10 mg total) by mouth every 6 (six) hours as needed (Nausea or vomiting). 30 tablet 1   prochlorperazine (COMPAZINE) 10 MG tablet Take 1 tablet (10 mg total) by mouth every 6 (six) hours as needed (Nausea or vomiting). 30 tablet 1   Ubiquinol 50 MG CAPS Take by mouth daily.     valACYclovir (VALTREX) 500 MG tablet  (Patient not taking: Reported on 07/24/2020)  12   VITAMIN D PO Vitamin D     zinc sulfate 220 (50 Zn) MG capsule  Take 220 mg by mouth daily.     No current facility-administered medications for this visit.   Facility-Administered Medications Ordered in Other Visits  Medication Dose Route Frequency Provider Last Rate Last Admin   sodium chloride flush (NS) 0.9 % injection 10 mL  10 mL Intravenous PRN Toba Claudio, Kim Dad, MD   10 mL at 08/19/20 0840    OBJECTIVE: White woman in no acute distress  Vitals:   08/19/20 0857  BP: (!) 121/52  Pulse: 63  Resp: 18  Temp: 97.6  F (36.4 C)  SpO2: 100%      Body mass index is 26.63 kg/m.   Wt Readings from Last 3 Encounters:  08/19/20 170 lb (77.1 kg)  08/04/20 166 lb 12.8 oz (75.7 kg)  07/28/20 169 lb 8 oz (76.9 kg)     ECOG FS:1 - Symptomatic but completely ambulatory  Sclerae unicteric, EOMs intact Wearing a mask No cervical or supraclavicular adenopathy Lungs no rales or rhonchi Heart regular rate and rhythm Abd soft, nontender, positive bowel sounds MSK no focal spinal tenderness, no upper extremity lymphedema Neuro: nonfocal, well oriented, appropriate affect Breasts: I am not able to palpate a mass in the right breast.  Both axillae are benign   LAB RESULTS:  CMP     Component Value Date/Time   NA 138 08/04/2020 1540   NA 142 05/11/2016 1508   K 4.2 08/04/2020 1540   K 4.3 05/11/2016 1508   CL 104 08/04/2020 1540   CO2 27 08/04/2020 1540   CO2 26 05/11/2016 1508   GLUCOSE 96 08/04/2020 1540   GLUCOSE 90 05/11/2016 1508   BUN 14 08/04/2020 1540   BUN 10.9 05/11/2016 1508   CREATININE 0.77 08/04/2020 1540   CREATININE 1.0 05/11/2016 1508   CALCIUM 9.4 08/04/2020 1540   CALCIUM 9.5 05/11/2016 1508   PROT 6.6 08/04/2020 1540   PROT 7.0 05/11/2016 1508   ALBUMIN 3.9 08/04/2020 1540   ALBUMIN 3.9 05/11/2016 1508   AST 29 08/04/2020 1540   AST 19 05/11/2016 1508   ALT 28 08/04/2020 1540   ALT 23 05/11/2016 1508   ALKPHOS 119 08/04/2020 1540   ALKPHOS 98 05/11/2016 1508   BILITOT 0.3 08/04/2020 1540   BILITOT 0.36  05/11/2016 1508   GFRNONAA >60 08/04/2020 1540    No results found for: TOTALPROTELP, ALBUMINELP, A1GS, A2GS, BETS, BETA2SER, GAMS, MSPIKE, SPEI  Lab Results  Component Value Date   WBC 9.2 08/19/2020   NEUTROABS 7.8 (H) 08/19/2020   HGB 12.2 08/19/2020   HCT 37.2 08/19/2020   MCV 93.7 08/19/2020   PLT 299 08/19/2020    No results found for: LABCA2  No components found for: [id number]  No results for input(s): INR in the last 168 hours.  No results found for: LABCA2  No results found for: CHY850  No results found for: YDX412  No results found for: INO676  No results found for: CA2729  No components found for: HGQUANT  No results found for: CEA1 / No results found for: CEA1   No results found for: AFPTUMOR  No results found for: CHROMOGRNA  No results found for: KPAFRELGTCHN, LAMBDASER, KAPLAMBRATIO (kappa/lambda light chains)  No results found for: HGBA, HGBA2QUANT, HGBFQUANT, HGBSQUAN (Hemoglobinopathy evaluation)   No results found for: LDH  No results found for: IRON, TIBC, IRONPCTSAT (Iron and TIBC)  No results found for: FERRITIN  Urinalysis No results found for: COLORURINE, APPEARANCEUR, LABSPEC, PHURINE, GLUCOSEU, HGBUR, BILIRUBINUR, KETONESUR, PROTEINUR, UROBILINOGEN, NITRITE, LEUKOCYTESUR   STUDIES: CT Chest Wo Contrast  Result Date: 07/23/2020 CLINICAL DATA:  Breast cancer staging. EXAM: CT CHEST WITHOUT CONTRAST TECHNIQUE: Multidetector CT imaging of the chest was performed following the standard protocol without IV contrast. COMPARISON:  No recent comparison. FINDINGS: Cardiovascular: LEFT-sided Port-A-Cath terminates at the caval to atrial junction. Heart size is normal without pericardial effusion. Aortic caliber is normal. Central pulmonary vasculature is normal caliber. Limited assessment of cardiovascular structures given lack of intravenous contrast. Mediastinum/Nodes: Esophagus mildly patulous.  Small hiatal hernia. No thoracic inlet  adenopathy.  RIGHT axillary adenopathy largest measuring approximately 16 mm. (Image 36/2) RIGHT retropectoral lymph node along the margin of the pectoralis minor (image 41/2) 11 mm. RIGHT axillary lymph node just lateral to the margin of the RIGHT pectoralis minor (image 47/2) 13 mm. Biopsy clips in the RIGHT axilla and in the RIGHT breast. No LEFT axillary lymphadenopathy. No internal mammary nodal enlargement. No mediastinal or gross hilar lymphadenopathy. Lungs/Pleura: No consolidation. No pleural effusion. Airways are patent. No suspicious pulmonary nodules. Basilar atelectasis. Upper Abdomen: Areas of hypodensity in the liver, largest area represents a cyst on image 137 of series 2 measuring approximately 4.9 x 3.9 cm. Small area near the dome of the RIGHT hemi liver (image 122/2) 14 mm with intermediate density, not compatible with cysts. Small low-density lesion potentially in hepatic subsegment Archer (image 159/2) 10 mm. Vague area of low density suspected in LEFT hepatic lobe lateral segment (image 135/2 potentially as large as 2.5 cm though not well evaluated. Pancreas unremarkable with respect to visualized portions as is spleen and adrenal glands. Low-density lesion in the interpolar RIGHT kidney measuring water density likely a small cyst. Musculoskeletal: Numerous tiny foci of lytic/lucent change (image 84/2) 5 mm in the LEFT lateral aspect of T7. Mixed lytic and sclerotic focus in the anterior aspect of T10 measures 1.8 by 1.3 cm. L1 with lytic focus measuring approximately 11 mm (image 68/5) Lytic focus in LEFT scapula (image 83/5) 6 mm. Other small scattered lytic foci. Subtle lesions in T4 and T8 as well. IMPRESSION: 1. RIGHT axillary and retropectoral adenopathy with evidence of prior biopsy in the RIGHT breast and RIGHT axilla. 2. Findings of bony metastatic disease and potential hepatic metastatic disease. PET scan may be helpful for further assessment. Electronically Signed   By: Zetta Bills  M.D.   On: 07/23/2020 22:41   MR Brain W Wo Contrast  Result Date: 08/13/2020 CLINICAL DATA:  Breast cancer staging.  Headache. EXAM: MRI HEAD WITHOUT AND WITH CONTRAST TECHNIQUE: Multiplanar, multiecho pulse sequences of the brain and surrounding structures were obtained without and with intravenous contrast. CONTRAST:  39m GADAVIST GADOBUTROL 1 MMOL/ML IV SOLN COMPARISON:  None. FINDINGS: Brain: Negative for metastatic disease.  No enhancing lesions. Ventricle size and cerebral volume normal. Few small deep white matter hyperintensities bilaterally. Negative for acute infarct or hemorrhage. Vascular: Normal arterial flow voids Skull and upper cervical spine: No focal skeletal lesion. Sinuses/Orbits: Negative Other: Multiple round well-circumscribed scalp lesion most likely Pilar cysts. IMPRESSION: Negative for metastatic disease to the brain. Electronically Signed   By: CFranchot GalloM.D.   On: 08/13/2020 07:27   NM Bone Scan Whole Body  Result Date: 07/25/2020 CLINICAL DATA:  Breast cancer staging, sternal bone lesion on recent MR EXAM: NUCLEAR MEDICINE WHOLE BODY BONE SCAN TECHNIQUE: Whole body anterior and posterior images were obtained approximately 3 hours after intravenous injection of radiopharmaceutical. RADIOPHARMACEUTICALS:  21.0 mCi Technetium-955mDP IV COMPARISON:  None Correlation: CT chest 07/23/2020, MR breast 07/07/2020 FINDINGS: Foci of abnormal tracer uptake are seen within the T10 vertebral body, T8 vertebral body, at midline of lower cervical spine, at superior LEFT manubrium, and the posterior LEFT ninth rib. The sites are suspicious for osseous metastases. The T8 and T10 lesions correspond to abnormalities on CT. The manubrial lesion corresponds to the abnormality seen on MR. Questionable tiny focus of sclerosis in the posterior LEFT ninth rib on CT. No additional sites of worrisome osseous tracer accumulation are identified. Expected urinary tract and soft tissue distribution of  tracer. IMPRESSION: Abnormal foci of uptake within T8, T10, midline lower cervical spine, superior LEFT manubrium and posterior LEFT ninth rib suspicious for osseous metastases as above. Electronically Signed   By: Lavonia Dana M.D.   On: 07/25/2020 16:45   MR LIVER W WO CONTRAST  Result Date: 07/28/2020 CLINICAL DATA:  Breast cancer. Indeterminate liver lesion on chest CT. EXAM: MRI ABDOMEN WITHOUT AND WITH CONTRAST TECHNIQUE: Multiplanar multisequence MR imaging of the abdomen was performed both before and after the administration of intravenous contrast. CONTRAST:  7.20m GADAVIST GADOBUTROL 1 MMOL/ML IV SOLN COMPARISON:  Chest CT and whole-body bone scan 07/23/2020. FINDINGS: Lower chest: Mild atelectasis at the right lung base. The known right axillary and subpectoral adenopathy is not well visualized on this study. There are nodular areas of enhancement inferiorly and laterally in the right breast. Hepatobiliary: No appendix steatosis or morphologic changes of cirrhosis. The lesion of concern in the dome of the right hepatic lobe (segment 7) measures 1.2 x 1.2 cm on image 8/5. This lesion demonstrates mild T2 hyperintensity, low T1 signal and restricted diffusion. Following contrast, there is fairly homogeneous arterial phase enhancement of the lesion. On delayed post-contrast imaging, there is no contrast washout with the lesion remaining isointense to blood pool. No other suspicious hepatic lesions. There is a 5.3 x 4.2 cm cyst in segment 4A and a 2.4 cm cyst in segment 6. No evidence of gallstones, gallbladder wall thickening or biliary dilatation. Pancreas: Unremarkable. No pancreatic ductal dilatation or surrounding inflammatory changes. Spleen: Normal in size without focal abnormality. Adrenals/Urinary Tract: Both adrenal glands appear normal. Small renal cysts. No evidence of enhancing renal mass or hydronephrosis. Stomach/Bowel: The stomach appears unremarkable for its degree of distension. No  evidence of bowel wall thickening, distention or surrounding inflammatory change. Vascular/Lymphatic: There are no enlarged abdominal lymph nodes. No significant vascular findings. Other: No ascites or peritoneal nodularity. Musculoskeletal: There are numerous T2 hyperintense and enhancing osseous lesions involving the thoracolumbar spine and visualized bony pelvis consistent with metastatic disease. The largest lesion is at T10, measuring up to 2.5 cm in diameter. No pathologic fracture or significant epidural tumor identified. IMPRESSION: 1. The lesion of concern in the dome of the right hepatic lobe on recent chest CT is indeterminate, with low T2 hyperintensity, homogeneous arterial phase enhancement and no washout on the delayed images. This could reflect an hepatic adenoma or atypical hemangioma, although metastatic disease is of at least moderate concern. Consider PET CT for further characterization. 2. Multiple metastases within the thoracolumbar spine and visualized bony pelvis. This disease could also be further evaluated with PET-CT. 3. Enhancing right breast lesions. Known right axillary and subpectoral adenopathy not well visualized by this abdominal MR. Electronically Signed   By: WRichardean SaleM.D.   On: 07/28/2020 10:26   NM PET Image Initial (PI) Skull Base To Thigh  Result Date: 08/04/2020 CLINICAL DATA:  Initial treatment strategy for breast cancer. EXAM: NUCLEAR MEDICINE PET SKULL BASE TO THIGH TECHNIQUE: 8.2 mCi F-18 FDG was injected intravenously. Full-ring PET imaging was performed from the skull base to thigh after the radiotracer. CT data was obtained and used for attenuation correction and anatomic localization. Fasting blood glucose: 80 mg/dl COMPARISON:  Abdominal MRI 07/26/2020.  Chest CT 07/23/2020. FINDINGS: Mediastinal blood pool activity: SUV max 2.2 Liver activity: SUV max NA NECK: No hypermetabolic lymph nodes in the neck. Incidental CT findings: none CHEST: The lateral right  breast lesion with biopsy marker shows low level hypermetabolism with SUV  max = 2.8. There is hypermetabolic right axillary lymphadenopathy. 10 mm short axis right axillary node on 57/4 demonstrates SUV max = 4.0 12 mm short axis right axillary node on 53/4 demonstrates SUV max = 2.8. No hypermetabolic metastatic lymphadenopathy in the mediastinum or hilar regions. No hypermetabolic disease in the left axilla. No hypermetabolic pulmonary nodule evident Incidental CT findings: Left-sided Port-A-Cath tip is positioned in the distal SVC. ABDOMEN/PELVIS: No abnormal hypermetabolic activity within the liver, pancreas, adrenal glands, or spleen. Specifically, the 12 mm lesion in the dome of the liver evaluated on recent MRI shows no hypermetabolism on PET imaging today. No hypermetabolic lymph nodes in the abdomen or pelvis. Symmetric nonspecific uptake noted in both ovaries. No adnexal mass. Incidental CT findings: Multiple hepatic cysts again noted. SKELETON: Relatively diffuse nodular hypermetabolism throughout the visualized skeletal elements is compatible with the known metastatic disease mainly involving the sternum, thoracolumbar spine and bony pelvis with some mild relative sparing of the ribs. Incidental CT findings: none IMPRESSION: 1. Hypermetabolic lateral right breast lesion with hypermetabolic lymph nodes in the right axilla consistent with metastatic disease. 2. No other evidence for soft tissue metastases on today's study. Specifically, the lesion in the dome of the liver shows no hypermetabolism on today's exam. 3. Relatively widespread nodular hypermetabolic uptake in the bony anatomy consistent with metastatic involvement. Electronically Signed   By: Misty Stanley M.D.   On: 08/04/2020 10:26      ELIGIBLE FOR AVAILABLE RESEARCH PROTOCOL: No  ASSESSMENT: 55 y.o. [place], [place] woman  LEFT BREAST ALH (1) status post left lumpectomy 03/22/2016 showing atypical lobular hyperplasia  (2) prophylactic  tamoxifen taken from 04/2016 through 03/2019  (a) status post hysterectomy with bilateral salpingo-oophorectomy February 2021  (3) genetic testing 05/22/2019 through the my risk hereditary cancer panel offered by [facility] found no deleterious mutations in BRCA, TP53, PALB2 or any of the other genes tested  (a) variants of uncertain significance were found in BRIP1 and NBN  RIGHT BREAST CANCER: Stage IV June 2022 (4) right breast lower outer quadrant biopsy 06/25/2020 shows a clinical T2 N1, stage IB invasive ductal carcinoma, grade 2 or 3, triple positive, with an MIB-1 of 40%.  (A) breast MRI 07/07/2020 shows mcT2 N1 disease as well as an enhancing lesion in the sternum (B) non-contrast Chest CT scan 07/23/2020 shows right axillary and retropetoral adenopathy, also several liver hypodensities and very small lytic/sclerotic bone lesions  (C) bone scan 07/25/2020 shows abnormal uptake at T8, T10, midline lower cervical spine, superior LEFT manubrium and posterior LEFT ninth rib  (D) abdominal MRI with and w/o conttrast 07/26/2020 shows a right hepatic 1.2 cm indeterminate lesion, no other lesions of concern in the liver; bone lesions re-demonstrated, larget at T10  (E) PET scan 08/01/2020 shows no hypermetabolism in the 1.2 cm right liver lesion; bony hypermetabolism consistent with known mets noted  (F) biopsy T10 to be performed at the end of chemotherapy  (G) brain MRI 08/12/2020 negative (H) CA 27-29 on 08/19/2020  (5) neoadjuvant chemotherapy consisting of docetaxel, carboplatin, trastuzumab and pertuzumab every 21 days x 4-6 cycles started 07/28/2020  (6) anti-HER2 treatment to be continued indefinitely  (a) echocardiogram on 07/03/2020 shows EF of 55-60%  (7) anastrozole/ abemaciclib to start at the completion of chemotherapy  (8) zolendronate started 08/19/2020, to be repeated every 12 weeks   PLAN: Kim Archer did remarkably well with her first cycle of chemotherapy.  We are proceeding with  cycle #2 today.  I have urged her to  give Imodium a try so she does not have issues with diarrhea.  She will receive her first Xgeva dose today.  She understands this frequently does cause significant bone pain.  She has Percocet available as well as some Aleve and she will take it both those drugs together up to 3 times a day as needed.  If that is not sufficient she will let me know.  Otherwise she will return to see me in 3 weeks for repeat treatment.  Recall she has her son wedding coming up in August.  She is going to have her wig fitted so she looks as normal as possible on that date  Total encounter time 25 minutes.*  Addendum: The patient's insurance will not approve denosumab/Xgeva unless the patient self administers at home.  They do approve Zometa which is what we are doing today   Sarajane Jews C. Yalonda Sample, MD 08/19/20 9:15 AM Medical Oncology and Hematology [facility] [place], [place] [address] Tel. [phone]    Fax. [phone]   I, Wilburn Mylar, am acting as scribe for Dr. Virgie Archer. Vaida Kerchner.  I, Lurline Del MD, have reviewed the above documentation for accuracy and completeness, and I agree with the above.    *Total Encounter Time as defined by the Centers for Medicare and Medicaid Services includes, in addition to the face-to-face time of a patient visit (documented in the note above) non-face-to-face time: obtaining and reviewing outside history, ordering and reviewing medications, tests or procedures, care coordination (communications with other health care professionals or caregivers) and documentation in the medical record.

## 2020-08-19 ENCOUNTER — Other Ambulatory Visit: Payer: Self-pay

## 2020-08-19 ENCOUNTER — Inpatient Hospital Stay: Payer: 59 | Attending: Oncology

## 2020-08-19 ENCOUNTER — Encounter: Payer: Self-pay | Admitting: *Deleted

## 2020-08-19 ENCOUNTER — Inpatient Hospital Stay: Payer: 59

## 2020-08-19 ENCOUNTER — Inpatient Hospital Stay (HOSPITAL_BASED_OUTPATIENT_CLINIC_OR_DEPARTMENT_OTHER): Payer: 59 | Admitting: Oncology

## 2020-08-19 VITALS — BP 121/52 | HR 63 | Temp 97.6°F | Resp 18 | Ht 67.0 in | Wt 170.0 lb

## 2020-08-19 DIAGNOSIS — Z5112 Encounter for antineoplastic immunotherapy: Secondary | ICD-10-CM | POA: Diagnosis present

## 2020-08-19 DIAGNOSIS — Z5111 Encounter for antineoplastic chemotherapy: Secondary | ICD-10-CM | POA: Diagnosis present

## 2020-08-19 DIAGNOSIS — Z79899 Other long term (current) drug therapy: Secondary | ICD-10-CM | POA: Diagnosis not present

## 2020-08-19 DIAGNOSIS — Z170 Estrogen receptor positive status [ER+]: Secondary | ICD-10-CM

## 2020-08-19 DIAGNOSIS — C7951 Secondary malignant neoplasm of bone: Secondary | ICD-10-CM | POA: Insufficient documentation

## 2020-08-19 DIAGNOSIS — Z7189 Other specified counseling: Secondary | ICD-10-CM

## 2020-08-19 DIAGNOSIS — C773 Secondary and unspecified malignant neoplasm of axilla and upper limb lymph nodes: Secondary | ICD-10-CM | POA: Diagnosis not present

## 2020-08-19 DIAGNOSIS — Z5189 Encounter for other specified aftercare: Secondary | ICD-10-CM | POA: Insufficient documentation

## 2020-08-19 DIAGNOSIS — C50511 Malignant neoplasm of lower-outer quadrant of right female breast: Secondary | ICD-10-CM

## 2020-08-19 DIAGNOSIS — M899 Disorder of bone, unspecified: Secondary | ICD-10-CM

## 2020-08-19 LAB — COMPREHENSIVE METABOLIC PANEL
ALT: 22 U/L (ref 0–44)
AST: 15 U/L (ref 15–41)
Albumin: 4 g/dL (ref 3.5–5.0)
Alkaline Phosphatase: 119 U/L (ref 38–126)
Anion gap: 10 (ref 5–15)
BUN: 22 mg/dL — ABNORMAL HIGH (ref 6–20)
CO2: 24 mmol/L (ref 22–32)
Calcium: 9.6 mg/dL (ref 8.9–10.3)
Chloride: 108 mmol/L (ref 98–111)
Creatinine, Ser: 0.81 mg/dL (ref 0.44–1.00)
GFR, Estimated: >60 mL/min (ref >60)
Glucose, Bld: 212 mg/dL — ABNORMAL HIGH (ref 70–99)
Potassium: 3.9 mmol/L (ref 3.5–5.1)
Sodium: 142 mmol/L (ref 135–145)
Total Bilirubin: 0.3 mg/dL (ref 0.3–1.2)
Total Protein: 7.2 g/dL (ref 6.5–8.1)

## 2020-08-19 LAB — CBC WITH DIFFERENTIAL/PLATELET
Abs Immature Granulocytes: 0.08 10*3/uL — ABNORMAL HIGH (ref 0.00–0.07)
Basophils Absolute: 0 10*3/uL (ref 0.0–0.1)
Basophils Relative: 0 %
Eosinophils Absolute: 0 10*3/uL (ref 0.0–0.5)
Eosinophils Relative: 0 %
HCT: 37.2 % (ref 36.0–46.0)
Hemoglobin: 12.2 g/dL (ref 12.0–15.0)
Immature Granulocytes: 1 %
Lymphocytes Relative: 10 %
Lymphs Abs: 0.9 10*3/uL (ref 0.7–4.0)
MCH: 30.7 pg (ref 26.0–34.0)
MCHC: 32.8 g/dL (ref 30.0–36.0)
MCV: 93.7 fL (ref 80.0–100.0)
Monocytes Absolute: 0.4 10*3/uL (ref 0.1–1.0)
Monocytes Relative: 4 %
Neutro Abs: 7.8 10*3/uL — ABNORMAL HIGH (ref 1.7–7.7)
Neutrophils Relative %: 85 %
Platelets: 299 10*3/uL (ref 150–400)
RBC: 3.97 MIL/uL (ref 3.87–5.11)
RDW: 13.4 % (ref 11.5–15.5)
WBC: 9.2 10*3/uL (ref 4.0–10.5)
nRBC: 0 % (ref 0.0–0.2)

## 2020-08-19 MED ORDER — TRASTUZUMAB-ANNS CHEMO 150 MG IV SOLR
6.0000 mg/kg | Freq: Once | INTRAVENOUS | Status: AC
Start: 2020-08-19 — End: 2020-08-19
  Administered 2020-08-19: 462 mg via INTRAVENOUS
  Filled 2020-08-19: qty 22

## 2020-08-19 MED ORDER — SODIUM CHLORIDE 0.9% FLUSH
10.0000 mL | INTRAVENOUS | Status: DC | PRN
Start: 2020-08-19 — End: 2020-08-19
  Administered 2020-08-19: 10 mL via INTRAVENOUS
  Filled 2020-08-19: qty 10

## 2020-08-19 MED ORDER — LIDOCAINE-PRILOCAINE 2.5-2.5 % EX CREA: TOPICAL_CREAM | CUTANEOUS | 3 refills | Status: DC

## 2020-08-19 MED ORDER — SODIUM CHLORIDE 0.9 % IV SOLN
420.0000 mg | Freq: Once | INTRAVENOUS | Status: AC
  Administered 2020-08-19: 420 mg via INTRAVENOUS
  Filled 2020-08-19: qty 14

## 2020-08-19 MED ORDER — HEPARIN SOD (PORK) LOCK FLUSH 100 UNIT/ML IV SOLN
500.0000 [IU] | Freq: Once | INTRAVENOUS | Status: AC | PRN
  Administered 2020-08-19: 500 [IU]
  Filled 2020-08-19: qty 5

## 2020-08-19 MED ORDER — ACETAMINOPHEN 325 MG PO TABS
650.0000 mg | ORAL_TABLET | Freq: Once | ORAL | Status: AC
  Administered 2020-08-19: 650 mg via ORAL

## 2020-08-19 MED ORDER — DIPHENHYDRAMINE HCL 25 MG PO CAPS
ORAL_CAPSULE | ORAL | Status: AC
  Filled 2020-08-19: qty 1

## 2020-08-19 MED ORDER — SODIUM CHLORIDE 0.9% FLUSH
10.0000 mL | INTRAVENOUS | Status: DC | PRN
  Administered 2020-08-19: 10 mL
  Filled 2020-08-19: qty 10

## 2020-08-19 MED ORDER — CARBOPLATIN CHEMO INJECTION 600 MG/60ML
590.0000 mg | Freq: Once | INTRAVENOUS | Status: AC
  Administered 2020-08-19: 590 mg via INTRAVENOUS
  Filled 2020-08-19: qty 59

## 2020-08-19 MED ORDER — ZOLEDRONIC ACID 4 MG/100ML IV SOLN
4.0000 mg | Freq: Once | INTRAVENOUS | Status: AC
  Administered 2020-08-19: 4 mg via INTRAVENOUS

## 2020-08-19 MED ORDER — SODIUM CHLORIDE 0.9 % IV SOLN
75.0000 mg/m2 | Freq: Once | INTRAVENOUS | Status: AC
  Administered 2020-08-19: 140 mg via INTRAVENOUS
  Filled 2020-08-19: qty 14

## 2020-08-19 MED ORDER — SODIUM CHLORIDE 0.9 % IV SOLN
Freq: Once | INTRAVENOUS | Status: AC
  Filled 2020-08-19: qty 250

## 2020-08-19 MED ORDER — SODIUM CHLORIDE 0.9 % IV SOLN
10.0000 mg | Freq: Once | INTRAVENOUS | Status: AC
  Administered 2020-08-19: 10 mg via INTRAVENOUS
  Filled 2020-08-19: qty 10

## 2020-08-19 MED ORDER — ZOLEDRONIC ACID 4 MG/100ML IV SOLN
INTRAVENOUS | Status: AC
  Filled 2020-08-19: qty 100

## 2020-08-19 MED ORDER — PROCHLORPERAZINE MALEATE 10 MG PO TABS: 10.0000 mg | ORAL_TABLET | Freq: Four times a day (QID) | ORAL | 1 refills | Status: DC | PRN

## 2020-08-19 MED ORDER — TRASTUZUMAB-DKST CHEMO 150 MG IV SOLR: 6.0000 mg/kg | Freq: Once | INTRAVENOUS | Status: DC

## 2020-08-19 MED ORDER — DIPHENHYDRAMINE HCL 25 MG PO CAPS
25.0000 mg | ORAL_CAPSULE | Freq: Once | ORAL | Status: AC
  Administered 2020-08-19: 25 mg via ORAL

## 2020-08-19 MED ORDER — PALONOSETRON HCL INJECTION 0.25 MG/5ML
INTRAVENOUS | Status: AC
  Filled 2020-08-19: qty 5

## 2020-08-19 MED ORDER — DENOSUMAB 120 MG/1.7ML ~~LOC~~ SOLN: 120.0000 mg | Freq: Once | SUBCUTANEOUS | Status: DC

## 2020-08-19 MED ORDER — DEXAMETHASONE 4 MG PO TABS: 8.0000 mg | ORAL_TABLET | Freq: Two times a day (BID) | ORAL | 1 refills | Status: DC

## 2020-08-19 MED ORDER — ACETAMINOPHEN 325 MG PO TABS
ORAL_TABLET | ORAL | Status: AC
  Filled 2020-08-19: qty 2

## 2020-08-19 MED ORDER — PALONOSETRON HCL INJECTION 0.25 MG/5ML
0.2500 mg | Freq: Once | INTRAVENOUS | Status: AC
  Administered 2020-08-19: 0.25 mg via INTRAVENOUS

## 2020-08-19 MED ORDER — SODIUM CHLORIDE 0.9 % IV SOLN
150.0000 mg | Freq: Once | INTRAVENOUS | Status: AC
  Administered 2020-08-19: 150 mg via INTRAVENOUS
  Filled 2020-08-19: qty 150

## 2020-08-19 NOTE — Patient Instructions (Signed)
[facility]  Discharge Instructions: Thank you for choosing [facility] to provide your oncology and hematology care.   If you have a lab appointment with the [facility], please go directly to the [facility] and check in at the registration area.   Wear comfortable clothing and clothing appropriate for easy access to any Portacath or PICC line.   We strive to give you quality time with your provider. You may need to reschedule your appointment if you arrive late (15 or more minutes).  Arriving late affects you and other patients whose appointments are after yours.  Also, if you miss three or more appointments without notifying the office, you may be dismissed from the clinic at the provider's discretion.      For prescription refill requests, have your pharmacy contact our office and allow 72 hours for refills to be completed.    Today you received the following chemotherapy and/or immunotherapy agents trastuzumab, pertuzumab, docetaxel, carboplatin      To help prevent nausea and vomiting after your treatment, we encourage you to take your nausea medication as directed.  BELOW ARE SYMPTOMS THAT SHOULD BE REPORTED IMMEDIATELY: *FEVER GREATER THAN 100.4 F (38 C) OR HIGHER *CHILLS OR SWEATING *NAUSEA AND VOMITING THAT IS NOT CONTROLLED WITH YOUR NAUSEA MEDICATION *UNUSUAL SHORTNESS OF BREATH *UNUSUAL BRUISING OR BLEEDING *URINARY PROBLEMS (pain or burning when urinating, or frequent urination) *BOWEL PROBLEMS (unusual diarrhea, constipation, pain near the anus) TENDERNESS IN MOUTH AND THROAT WITH OR WITHOUT PRESENCE OF ULCERS (sore throat, sores in mouth, or a toothache) UNUSUAL RASH, SWELLING OR PAIN  UNUSUAL VAGINAL DISCHARGE OR ITCHING   Items with * indicate a potential emergency and should be followed up as soon as possible or go to the Emergency Department if any problems should occur.  Please show the CHEMOTHERAPY ALERT CARD or  IMMUNOTHERAPY ALERT CARD at check-in to the Emergency Department and triage nurse.  Should you have questions after your visit or need to cancel or reschedule your appointment, please contact [facility]  Dept: [phone]  and follow the prompts.  Office hours are 8:00 a.m. to 4:30 p.m. Monday - Friday. Please note that voicemails left after 4:00 p.m. may not be returned until the following business day.  We are closed weekends and major holidays. You have access to a nurse at all times for urgent questions. Please call the main number to the clinic Dept: [phone] and follow the prompts.   For any non-urgent questions, you may also contact your provider using MyChart. We now offer e-Visits for anyone 64 and older to request care online for non-urgent symptoms. For details visit [email or web address].   Also download the MyChart app! Go to the app store, search "MyChart", open the app, select [facility], and log in with your MyChart username and password.  Due to Covid, a mask is required upon entering the hospital/clinic. If you do not have a mask, one will be given to you upon arrival. For doctor visits, patients may have 1 support person aged 6 or older with them. For treatment visits, patients cannot have anyone with them due to current Covid guidelines and our immunocompromised population.

## 2020-08-19 NOTE — Patient Instructions (Signed)

## 2020-08-19 NOTE — Addendum Note (Signed)
Addended by: Chauncey Cruel on: 08/19/2020 11:24 AM   Modules accepted: Orders

## 2020-08-21 ENCOUNTER — Other Ambulatory Visit: Payer: Self-pay | Admitting: *Deleted

## 2020-08-21 ENCOUNTER — Inpatient Hospital Stay: Payer: 59

## 2020-08-21 ENCOUNTER — Telehealth: Payer: Self-pay | Admitting: *Deleted

## 2020-08-21 ENCOUNTER — Other Ambulatory Visit: Payer: Self-pay

## 2020-08-21 VITALS — BP 128/69 | HR 57 | Temp 98.4°F | Resp 16

## 2020-08-21 DIAGNOSIS — Z5112 Encounter for antineoplastic immunotherapy: Secondary | ICD-10-CM | POA: Diagnosis not present

## 2020-08-21 DIAGNOSIS — C7951 Secondary malignant neoplasm of bone: Secondary | ICD-10-CM

## 2020-08-21 DIAGNOSIS — R300 Dysuria: Secondary | ICD-10-CM

## 2020-08-21 DIAGNOSIS — Z7189 Other specified counseling: Secondary | ICD-10-CM

## 2020-08-21 DIAGNOSIS — C50511 Malignant neoplasm of lower-outer quadrant of right female breast: Secondary | ICD-10-CM

## 2020-08-21 DIAGNOSIS — Z170 Estrogen receptor positive status [ER+]: Secondary | ICD-10-CM

## 2020-08-21 LAB — URINALYSIS, COMPLETE (UACMP) WITH MICROSCOPIC
Bilirubin Urine: NEGATIVE
Glucose, UA: NEGATIVE mg/dL
Hgb urine dipstick: NEGATIVE
Ketones, ur: NEGATIVE mg/dL
Nitrite: NEGATIVE
Protein, ur: NEGATIVE mg/dL
Specific Gravity, Urine: 1.009 (ref 1.005–1.030)
pH: 7 (ref 5.0–8.0)

## 2020-08-21 LAB — COMPREHENSIVE METABOLIC PANEL
ALT: 24 U/L (ref 0–44)
AST: 22 U/L (ref 15–41)
Albumin: 3.8 g/dL (ref 3.5–5.0)
Alkaline Phosphatase: 103 U/L (ref 38–126)
Anion gap: 7 (ref 5–15)
BUN: 24 mg/dL — ABNORMAL HIGH (ref 6–20)
CO2: 25 mmol/L (ref 22–32)
Calcium: 8.6 mg/dL — ABNORMAL LOW (ref 8.9–10.3)
Chloride: 108 mmol/L (ref 98–111)
Creatinine, Ser: 0.76 mg/dL (ref 0.44–1.00)
GFR, Estimated: >60 mL/min (ref >60)
Glucose, Bld: 137 mg/dL — ABNORMAL HIGH (ref 70–99)
Potassium: 4.3 mmol/L (ref 3.5–5.1)
Sodium: 140 mmol/L (ref 135–145)
Total Bilirubin: 0.4 mg/dL (ref 0.3–1.2)
Total Protein: 6.9 g/dL (ref 6.5–8.1)

## 2020-08-21 LAB — CBC WITH DIFFERENTIAL/PLATELET
Abs Immature Granulocytes: 0.05 10*3/uL (ref 0.00–0.07)
Basophils Absolute: 0 10*3/uL (ref 0.0–0.1)
Basophils Relative: 0 %
Eosinophils Absolute: 0 10*3/uL (ref 0.0–0.5)
Eosinophils Relative: 0 %
HCT: 38.6 % (ref 36.0–46.0)
Hemoglobin: 12.7 g/dL (ref 12.0–15.0)
Immature Granulocytes: 1 %
Lymphocytes Relative: 3 %
Lymphs Abs: 0.2 10*3/uL — ABNORMAL LOW (ref 0.7–4.0)
MCH: 31.1 pg (ref 26.0–34.0)
MCHC: 32.9 g/dL (ref 30.0–36.0)
MCV: 94.4 fL (ref 80.0–100.0)
Monocytes Absolute: 0.1 10*3/uL (ref 0.1–1.0)
Monocytes Relative: 1 %
Neutro Abs: 8 10*3/uL — ABNORMAL HIGH (ref 1.7–7.7)
Neutrophils Relative %: 95 %
Platelets: 265 10*3/uL (ref 150–400)
RBC: 4.09 MIL/uL (ref 3.87–5.11)
RDW: 13.9 % (ref 11.5–15.5)
WBC: 8.4 10*3/uL (ref 4.0–10.5)
nRBC: 0 % (ref 0.0–0.2)

## 2020-08-21 MED ORDER — DOXYCYCLINE HYCLATE 100 MG PO TABS: 100.0000 mg | ORAL_TABLET | Freq: Two times a day (BID) | ORAL | 0 refills | Status: DC

## 2020-08-21 MED ORDER — PEGFILGRASTIM-BMEZ 6 MG/0.6ML ~~LOC~~ SOSY
PREFILLED_SYRINGE | SUBCUTANEOUS | Status: AC
  Filled 2020-08-21: qty 0.6

## 2020-08-21 MED ORDER — PEGFILGRASTIM-BMEZ 6 MG/0.6ML ~~LOC~~ SOSY
6.0000 mg | PREFILLED_SYRINGE | Freq: Once | SUBCUTANEOUS | Status: AC
  Administered 2020-08-21: 6 mg via SUBCUTANEOUS

## 2020-08-21 NOTE — Telephone Encounter (Signed)
Pt informed this RN of noted symptoms of dysuria occurring.  Obtained U/A with injection appt today- with mild abnormality.  Per MD order obtained for antibiotic- discussed with pt - pharmacy verified and medication sent.

## 2020-08-22 LAB — URINE CULTURE: Culture: <10000 — AB

## 2020-08-22 LAB — CANCER ANTIGEN 27.29: CA 27.29: 37.8 U/mL (ref 0.0–38.6)

## 2020-08-25 ENCOUNTER — Encounter: Payer: Self-pay | Admitting: *Deleted

## 2020-08-26 ENCOUNTER — Other Ambulatory Visit: Payer: Self-pay | Admitting: Oncology

## 2020-08-26 ENCOUNTER — Telehealth: Payer: Self-pay | Admitting: *Deleted

## 2020-08-26 NOTE — Telephone Encounter (Signed)
This RN spoke with pt per her call stating onset of constipation ( history of ) over the weekend with use of home remedies for movement with development of hemorrhoidal bleeding.  Bleeding is noted with stools only- on the tissue and drops in the toilet.  Pt denies passing of any " gelatinous " stool.  She is not having any bleeding from rectum between bowel movements.  Above discussed with need to use stool softener daily i.e. colace - and continue use of wet wipes with Kristi verbalizing understanding.  She also stated need for correction in dictation - Noted in assessment " hysterectomy with bilateral salpingo oophorectomy ".  [name] states she did not have her ovaries removed.  Noted above is correct under GYN paragraph.  Thirdly reading of social history needs clarification-  The patient's daughter/husband and 3 children live with her temporarily. Daughter Jiles Garter does not reside with the patient.  Concern for above is in regard to [place] having legal custody of 1 of Elena's children - and per court order - Jiles Garter cannot have any contact with the child. [name] is honoring court order per legal custody and " just do not want any confusion in this situation " per possible submission of health records for court review.  This RN informed pt above would be given to MD for review and correction to dictation to reflect appropriately .

## 2020-08-26 NOTE — Progress Notes (Signed)
[place]  [phone] [phone]  This is a corrected note: 08/26/2020   ID: Kim Archer DOB: [birth year]  MR#: [id number]  EXB#:[id number]  Patient Care Team: [name], MD as PCP - General (Obstetrics and Gynecology) Ormond Lazo, Virgie Dad, MD as Consulting Physician (Oncology) Jovita Kussmaul, MD as Consulting Physician (General Surgery) [name], MD as Consulting Physician (Obstetrics and Gynecology) Mauro Kaufmann, RN as Oncology Nurse Navigator Rockwell [place], RN as Oncology Nurse Navigator Jovita Kussmaul, MD as Consulting Physician (General Surgery) Zayvier Caravello, Virgie Dad, MD as Consulting Physician (Oncology) Gery Pray, MD as Consulting Physician (Radiation Oncology) Chauncey Cruel, MD OTHER MD:  CHIEF COMPLAINT: Triple positive breast cancer  CURRENT TREATMENT: Neoadjuvant chemotherapy; denosumab/Xgeva   INTERVAL HISTORY: Kim Archer returns today for follow up and treatment of her triple positive breast cancer.  She is accompanied by her husband Kim Archer  Since her last visit, she underwent staging brain MRI on 08/12/2020 showing: no metastatic disease to the brain.  She began neoadjuvant chemotherapy, consisting of Docetaxel, Carboplatin, trastuzumab and Pertuzumab given on day 1 of a 21 day cycle with Neulasta or biosimilar given on day 3, on 07/28/2020.  She had an excellent nadir count of 30,000 with 16.2 neutrophils.  She is here today for cycle 2-day 1.  Her most recent echocardiogram was completed on 07/03/2020 showing and ejection fraction of 55-60%.  She will have her first Xgeva dose together with chemotherapy today.  We will repeat the Xgeva every 6 weeks while she is receiving her chemotherapy to avoid excessive visits.   REVIEW OF SYSTEMS: Kim Archer was able to continue to work right through her first cycle of chemotherapy.  She felt a little weak a couple of times and had to leave early 1 day but otherwise she  continues to function pretty normally, did a lot of gardening this last weekend.  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  She had several loose bowel movements, and she did not take any Imodium or colestipol.  Her hair is coming out in clumps.  She does have a wig available but she is wearing a hat today.  A detailed review of systems today was otherwise stable  COVID 19 VACCINATION STATUS: not vaccinated; infection 10/2019   HISTORY OF CURRENT ILLNESS: From the original intake note:  ANESIA BLACKWELL "Kim Archer" [facility]) was previously seen in the high risk breast cancer clinic on 05/11/2016. She had undergone left lumpectomy on 03/22/2016 showing atypical lobular hyperplasia. She was prescribed tamoxifen at that time, which she believes she took for two years. [She underwent hysterectomy with bilateral salpingo-oophorectomy 03/28/2019, pathology showing leiomyomas and adenomyosis, and may have stopped the tamoxifen around that time].  More recently, she had routine screening mammography on 06/11/2020 showing an area of asymmetry in the right breast. She underwent right diagnostic mammography with tomography and right breast ultrasonography at [facility] on 06/20/2020 showing: breast density category C; 2.4 cm right breast mass at 7 o'clock; one abnormal right axillary lymph node measuring 1.5 cm.  Accordingly on 06/25/2020 she proceeded to biopsy of the right breast area in question. The pathology from this procedure ([id number]) showed: invasive mammary carcinoma, e-cadherin positive, grade 2 or 3. Prognostic indicators significant for: estrogen receptor, 40% positive and progesterone receptor, >95% positive, both with strong staining intensity. Proliferation marker Ki67 at 40%. HER2 positive by immunohistochemistry (3+).  Biopsy of the right axillary lymph node performed at the same time showed mammary  carcinoma, with no residual lymph node tissue present.  Cancer Staging Malignant neoplasm of lower-outer quadrant of right breast of female, estrogen receptor positive ([place]) Staging form: Breast, AJCC 8th Edition - Clinical stage from 07/02/2020: Stage IV (cT2, cN1, cM1, G3, ER+, PR+, HER2+) - Signed by Chauncey Cruel, MD on 08/04/2020 Stage prefix: Initial diagnosis Histologic grading system: 3 grade system Laterality: Right Staged by: Pathologist and managing physician Stage used in treatment planning: Yes National guidelines used in treatment planning: Yes Type of national guideline used in treatment planning: NCCN  The patient's subsequent history is as detailed below.   PAST MEDICAL HISTORY: Past Medical History:  Diagnosis Date   Allergy    Anemia    due to heavy periods   Atypical ductal hyperplasia of left breast    History of kidney stones    HSV-2 (herpes simplex virus 2) infection    PONV (postoperative nausea and vomiting)    right breast ca 06/2020   Breast cancer    PAST SURGICAL HISTORY: Past Surgical History:  Procedure Laterality Date   BREAST CYST EXCISION Left 03/22/2016   ALH-BENIGN   BREAST LUMPECTOMY WITH RADIOACTIVE SEED LOCALIZATION Left 03/22/2016   Procedure: LEFT BREAST LUMPECTOMY WITH RADIOACTIVE SEED LOCALIZATION;  Surgeon: Autumn Messing III, MD;  Location: [place];  Service: General;  Laterality: Left;   CESAREAN SECTION     x3   COMBINED HYSTEROSCOPY DIAGNOSTIC / D&C  07/17/2018   EYE SURGERY     lt lens replacement   PORTACATH PLACEMENT Left 07/07/2020   Procedure: INSERTION PORT-A-CATH;  Surgeon: Jovita Kussmaul, MD;  Location: [place];  Service: General;  Laterality: Left;   TUBAL LIGATION      FAMILY HISTORY: Family History  Problem Relation Age of Onset   Breast cancer Mother    Melanoma Father    Leukemia Sister    Liver cancer Brother        stomach   Breast cancer Maternal Aunt    Colon cancer Neg Hx    Colon  polyps Neg Hx    Esophageal cancer Neg Hx    Rectal cancer Neg Hx    Stomach cancer Neg Hx   The patient's father died at age 20. He had a history of melanoma. He has a brother, the patient's uncle, with prostate cancer. The patient's mother died in her 41s from urosepsis. She had a history of breast cancer diagnosed in her 12s. The patient has a brother, with cerebral palsy, who had "stomach cancer" metastatic to the liver. The patient has 2 sisters. One of them died from childhood leukemia.    GYNECOLOGIC HISTORY:  No LMP recorded (lmp unknown). Patient has had a hysterectomy. Menarche: 55 years old Age at first live birth: 55 years old [place] P 3 LMP 03/2019, with hysterectomy Contraceptive: used remotely for many years with no complications HRT never used  Hysterectomy? Yes, 03/2019 (path in Epic) BSO? 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 Her husband Kim Archer (goes by "Kim Archer") is Airline pilot for [employer], which manages apartment complexes. The patient's daughter Kim Archer, age 35, works as a Theme park manager in [place]. She has 3 children. She, her husband, and their three children live with [place] and Fred. The patient's daughter Kim Archer, age 11, lives in [place]. The patient's  third child, Kim Archer "Kim Archer, 33 years old, also lives in [place] and works in Architect. Patient has in total 8 grandchildren. She is a Psychologist, forensic.    ADVANCED DIRECTIVES: In the absence of any documentation to the contrary, the patient's spouse is their HCPOA.    HEALTH MAINTENANCE: Social History   Tobacco Use   Smoking status: Never   Smokeless tobacco: Never  Vaping Use   Vaping Use: Never used  Substance Use Topics   Alcohol use: No   Drug use: No     Colonoscopy: 08/2018 (Dr. Ardis Hughs), recall 2030  PAP: 05/2020  Bone density: 05/2019 (Dr. Christen Butter office)   Allergies   Allergen Reactions   Ciprofloxacin Other (See Comments)    Patient came into office with complaints of difficulty swallowing.  Ate at [employer] prior and took Cipro prior   Sulfa Antibiotics Rash    Current Outpatient Medications  Medication Sig Dispense Refill   LORazepam (ATIVAN) 0.5 MG tablet Take 1 tablet (0.5 mg total) by mouth every 8 (eight) hours as needed for anxiety (May increase to 2 tablets if needed). (Patient not taking: Reported on 07/24/2020) 30 tablet 1   Boron 3 MG CAPS Take by mouth.     Calcium 500 MG tablet 1,000 mg.     dexamethasone (DECADRON) 4 MG tablet Take 2 tablets (8 mg total) by mouth 2 (two) times daily. Start the day before Taxotere. Then take daily x 3 days after chemotherapy. 30 tablet 1   dexamethasone (DECADRON) 4 MG tablet Take 2 tablets (8 mg total) by mouth 2 (two) times daily. Start the day before Taxotere. Then take daily x 3 days after chemotherapy. 30 tablet 1   Diethylpropion HCl CR 75 MG TB24 diethylpropion ER 75 mg tablet,extended release  TAKE 1 TABLET BY MOUTH EVERY DAY     doxycycline (VIBRA-TABS) 100 MG tablet Take 1 tablet (100 mg total) by mouth 2 (two) times daily. 10 tablet 0   levocetirizine (XYZAL) 5 MG tablet Take 5 mg by mouth every evening.     lidocaine-prilocaine (EMLA) cream Apply to affected area once 30 g 3   loratadine (CLARITIN) 10 MG tablet Take 10 mg by mouth daily.     LUTEIN PO Take by mouth.     MAGNESIUM PO Take 1 tablet by mouth at bedtime.     Menatetrenone (VITAMIN K2) 100 MCG TABS Take by mouth.     Multiple Vitamin (MULTIVITAMIN WITH MINERALS) TABS tablet Take 1 tablet by mouth daily.     prochlorperazine (COMPAZINE) 10 MG tablet Take 1 tablet (10 mg total) by mouth every 6 (six) hours as needed (Nausea or vomiting). 30 tablet 1   prochlorperazine (COMPAZINE) 10 MG tablet Take 1 tablet (10 mg total) by mouth every 6 (six) hours as needed (Nausea or vomiting). 30 tablet 1   Ubiquinol 50 MG CAPS Take by mouth  daily.     valACYclovir (VALTREX) 500 MG tablet  (Patient not taking: Reported on 07/24/2020)  12   VITAMIN D PO Vitamin D     zinc sulfate 220 (50 Zn) MG capsule Take 220 mg by mouth daily.     No current facility-administered medications for this visit.    OBJECTIVE: White woman in no acute distress  There were no vitals filed for this visit.     There is no height or weight on file to calculate BMI.   Wt Readings from Last 3 Encounters:  08/19/20 170 lb (77.1 kg)  08/04/20 166 lb 12.8 oz (75.7 kg)  07/28/20 169 lb 8 oz (76.9 kg)     ECOG FS:1 - Symptomatic but completely ambulatory  Sclerae unicteric, EOMs intact Wearing a mask No cervical or supraclavicular adenopathy Lungs no rales or rhonchi Heart regular rate and rhythm Abd soft, nontender, positive bowel sounds MSK no focal spinal tenderness, no upper extremity lymphedema Neuro: nonfocal, well oriented, appropriate affect Breasts: I am not able to palpate a mass in the right breast.  Both axillae are benign   LAB RESULTS:  CMP     Component Value Date/Time   NA 140 08/21/2020 1334   NA 142 05/11/2016 1508   K 4.3 08/21/2020 1334   K 4.3 05/11/2016 1508   CL 108 08/21/2020 1334   CO2 25 08/21/2020 1334   CO2 26 05/11/2016 1508   GLUCOSE 137 (H) 08/21/2020 1334   GLUCOSE 90 05/11/2016 1508   BUN 24 (H) 08/21/2020 1334   BUN 10.9 05/11/2016 1508   CREATININE 0.76 08/21/2020 1334   CREATININE 0.77 08/04/2020 1540   CREATININE 1.0 05/11/2016 1508   CALCIUM 8.6 (L) 08/21/2020 1334   CALCIUM 9.5 05/11/2016 1508   PROT 6.9 08/21/2020 1334   PROT 7.0 05/11/2016 1508   ALBUMIN 3.8 08/21/2020 1334   ALBUMIN 3.9 05/11/2016 1508   AST 22 08/21/2020 1334   AST 29 08/04/2020 1540   AST 19 05/11/2016 1508   ALT 24 08/21/2020 1334   ALT 28 08/04/2020 1540   ALT 23 05/11/2016 1508   ALKPHOS 103 08/21/2020 1334   ALKPHOS 98 05/11/2016 1508   BILITOT 0.4 08/21/2020 1334   BILITOT 0.3 08/04/2020 1540   BILITOT 0.36  05/11/2016 1508   GFRNONAA >60 08/21/2020 1334   GFRNONAA >60 08/04/2020 1540    No results found for: TOTALPROTELP, ALBUMINELP, A1GS, A2GS, BETS, BETA2SER, GAMS, MSPIKE, SPEI  Lab Results  Component Value Date   WBC 8.4 08/21/2020   NEUTROABS 8.0 (H) 08/21/2020   HGB 12.7 08/21/2020   HCT 38.6 08/21/2020   MCV 94.4 08/21/2020   PLT 265 08/21/2020    No results found for: LABCA2  No components found for: [id number]  No results for input(s): INR in the last 168 hours.  No results found for: LABCA2  No results found for: PPI951  No results found for: OAC166  No results found for: AYT016  Lab Results  Component Value Date   CA2729 37.8 08/21/2020    No components found for: HGQUANT  No results found for: CEA1 / No results found for: CEA1   No results found for: AFPTUMOR  No results found for: CHROMOGRNA  No results found for: KPAFRELGTCHN, LAMBDASER, KAPLAMBRATIO (kappa/lambda light chains)  No results found for: HGBA, HGBA2QUANT, HGBFQUANT, HGBSQUAN (Hemoglobinopathy evaluation)   No results found for: LDH  No results found for: IRON, TIBC, IRONPCTSAT (Iron and TIBC)  No results found for: FERRITIN  Urinalysis    Component Value Date/Time   COLORURINE YELLOW 08/21/2020 1335   APPEARANCEUR CLEAR 08/21/2020 1335   LABSPEC 1.009 08/21/2020 1335   PHURINE 7.0 08/21/2020 1335   GLUCOSEU NEGATIVE 08/21/2020 1335   HGBUR NEGATIVE 08/21/2020 1335   BILIRUBINUR NEGATIVE 08/21/2020 1335   KETONESUR NEGATIVE 08/21/2020 1335   PROTEINUR NEGATIVE 08/21/2020 1335   NITRITE NEGATIVE 08/21/2020 1335   LEUKOCYTESUR TRACE (A) 08/21/2020 1335     STUDIES: MR Brain W Wo Contrast  Result Date: 08/13/2020 CLINICAL DATA:  Breast cancer staging.  Headache. EXAM: MRI HEAD WITHOUT AND WITH  CONTRAST TECHNIQUE: Multiplanar, multiecho pulse sequences of the brain and surrounding structures were obtained without and with intravenous contrast. CONTRAST:  37m GADAVIST  GADOBUTROL 1 MMOL/ML IV SOLN COMPARISON:  None. FINDINGS: Brain: Negative for metastatic disease.  No enhancing lesions. Ventricle size and cerebral volume normal. Few small deep white matter hyperintensities bilaterally. Negative for acute infarct or hemorrhage. Vascular: Normal arterial flow voids Skull and upper cervical spine: No focal skeletal lesion. Sinuses/Orbits: Negative Other: Multiple round well-circumscribed scalp lesion most likely Pilar cysts. IMPRESSION: Negative for metastatic disease to the brain. Electronically Signed   By: CFranchot GalloM.D.   On: 08/13/2020 07:27   NM PET Image Initial (PI) Skull Base To Thigh  Result Date: 08/04/2020 CLINICAL DATA:  Initial treatment strategy for breast cancer. EXAM: NUCLEAR MEDICINE PET SKULL BASE TO THIGH TECHNIQUE: 8.2 mCi F-18 FDG was injected intravenously. Full-ring PET imaging was performed from the skull base to thigh after the radiotracer. CT data was obtained and used for attenuation correction and anatomic localization. Fasting blood glucose: 80 mg/dl COMPARISON:  Abdominal MRI 07/26/2020.  Chest CT 07/23/2020. FINDINGS: Mediastinal blood pool activity: SUV max 2.2 Liver activity: SUV max NA NECK: No hypermetabolic lymph nodes in the neck. Incidental CT findings: none CHEST: The lateral right breast lesion with biopsy marker shows low level hypermetabolism with SUV max = 2.8. There is hypermetabolic right axillary lymphadenopathy. 10 mm short axis right axillary node on 57/4 demonstrates SUV max = 4.0 12 mm short axis right axillary node on 53/4 demonstrates SUV max = 2.8. No hypermetabolic metastatic lymphadenopathy in the mediastinum or hilar regions. No hypermetabolic disease in the left axilla. No hypermetabolic pulmonary nodule evident Incidental CT findings: Left-sided Port-A-Cath tip is positioned in the distal SVC. ABDOMEN/PELVIS: No abnormal hypermetabolic activity within the liver, pancreas, adrenal glands, or spleen. Specifically, the  12 mm lesion in the dome of the liver evaluated on recent MRI shows no hypermetabolism on PET imaging today. No hypermetabolic lymph nodes in the abdomen or pelvis. Symmetric nonspecific uptake noted in both ovaries. No adnexal mass. Incidental CT findings: Multiple hepatic cysts again noted. SKELETON: Relatively diffuse nodular hypermetabolism throughout the visualized skeletal elements is compatible with the known metastatic disease mainly involving the sternum, thoracolumbar spine and bony pelvis with some mild relative sparing of the ribs. Incidental CT findings: none IMPRESSION: 1. Hypermetabolic lateral right breast lesion with hypermetabolic lymph nodes in the right axilla consistent with metastatic disease. 2. No other evidence for soft tissue metastases on today's study. Specifically, the lesion in the dome of the liver shows no hypermetabolism on today's exam. 3. Relatively widespread nodular hypermetabolic uptake in the bony anatomy consistent with metastatic involvement. Electronically Signed   By: EMisty StanleyM.D.   On: 08/04/2020 10:26      ELIGIBLE FOR AVAILABLE RESEARCH PROTOCOL: No  ASSESSMENT: 55y.o. [place]  LEFT BREAST ALH (1) status post left lumpectomy 03/22/2016 showing atypical lobular hyperplasia  (2) prophylactic tamoxifen taken from 04/2016 through 03/2019  (a) status post hysterectomy (without bilateral salpingo-oophorectomy) February 2021  (3) genetic testing 05/22/2019 through the my risk hereditary cancer panel offered by [facility] found no deleterious mutations in BRCA, TP53, PALB2 or any of the other genes tested  (a) variants of uncertain significance were found in BRIP1 and NBN  RIGHT BREAST CANCER: Stage IV June 2022 (4) right breast lower outer quadrant biopsy 06/25/2020 shows a clinical T2 N1, stage IB invasive ductal carcinoma, grade 2 or 3, triple positive, with  an MIB-1 of 40%.  (A) breast MRI 07/07/2020 shows mcT2 N1 disease as well as an enhancing  lesion in the sternum (B) non-contrast Chest CT scan 07/23/2020 shows right axillary and retropetoral adenopathy, also several liver hypodensities and very small lytic/sclerotic bone lesions  (C) bone scan 07/25/2020 shows abnormal uptake at T8, T10, midline lower cervical spine, superior LEFT manubrium and posterior LEFT ninth rib  (D) abdominal MRI with and w/o conttrast 07/26/2020 shows a right hepatic 1.2 cm indeterminate lesion, no other lesions of concern in the liver; bone lesions re-demonstrated, larget at T10  (E) PET scan 08/01/2020 shows no hypermetabolism in the 1.2 cm right liver lesion; bony hypermetabolism consistent with known mets noted  (F) biopsy T10 to be performed at the end of chemotherapy  (G) brain MRI 08/12/2020 negative (H) CA 27-29 on 08/19/2020  (5) neoadjuvant chemotherapy consisting of docetaxel, carboplatin, trastuzumab and pertuzumab every 21 days x 4-6 cycles started 07/28/2020  (6) anti-HER2 treatment to be continued indefinitely  (a) echocardiogram on 07/03/2020 shows EF of 55-60%  (7) anastrozole/ abemaciclib to start at the completion of chemotherapy  (8) zolendronate started 08/19/2020, to be repeated every 12 weeks   PLAN: Kim Archer did remarkably well with her first cycle of chemotherapy.  We are proceeding with cycle #2 today.  I have urged her to give Imodium a try so she does not have issues with diarrhea.  She will receive her first Xgeva dose today.  She understands this frequently does cause significant bone pain.  She has Percocet available as well as some Aleve and she will take it both those drugs together up to 3 times a day as needed.  If that is not sufficient she will let me know.  Otherwise she will return to see me in 3 weeks for repeat treatment.  Recall she has her son wedding coming up in August.  She is going to have her wig fitted so she looks as normal as possible on that date  Total encounter time 25 minutes.*  Addendum: The  patient's insurance will not approve denosumab/Xgeva unless the patient self administers at home.  They do approve Zometa which is what we are doing today   Sarajane Jews C. Hurley Blevins, MD 08/26/20 11:41 AM Medical Oncology and Hematology [facility] [place], [place] [address] Tel. [phone]    Fax. [phone]   I, Wilburn Mylar, am acting as scribe for Dr. Virgie Dad. Folashade Gamboa.  I, Lurline Del MD, have reviewed the above documentation for accuracy and completeness, and I agree with the above.    *Total Encounter Time as defined by the Centers for Medicare and Medicaid Services includes, in addition to the face-to-face time of a patient visit (documented in the note above) non-face-to-face time: obtaining and reviewing outside history, ordering and reviewing medications, tests or procedures, care coordination (communications with other health care professionals or caregivers) and documentation in the medical record.

## 2020-09-08 NOTE — Progress Notes (Signed)
[facility]  [phone] [phone]    ID: Kim Archer DOB: 05-13-1965  MR#: [id number]  UYQ#:[id number]  Patient Care Team: [name], MD as PCP - General (Obstetrics and Gynecology) Saniyah Mondesir, Virgie Dad, MD as Consulting Physician (Oncology) Jovita Kussmaul, MD as Consulting Physician (General Surgery) [name], MD as Consulting Physician (Obstetrics and Gynecology) Mauro Kaufmann, RN as Oncology Nurse Navigator Rockwell Germany, RN as Oncology Nurse Navigator Jovita Kussmaul, MD as Consulting Physician (General Surgery) Florice Hindle, Virgie Dad, MD as Consulting Physician (Oncology) Gery Pray, MD as Consulting Physician (Radiation Oncology) Chauncey Cruel, MD OTHER MD:  CHIEF COMPLAINT: Triple positive breast cancer  CURRENT TREATMENT: Neoadjuvant chemotherapy; denosumab/Xgeva   INTERVAL HISTORY: Kim Archer returns today for follow up and treatment of her triple positive breast cancer.    She began neoadjuvant chemotherapy, consisting of Docetaxel, Carboplatin, trastuzumab and Pertuzumab given on day 1 of a 21 day cycle with Neulasta or biosimilar given on day 3, on 07/28/2020. She is here today for cycle 3 day 1.  Her most recent echocardiogram was completed on 07/03/2020 showing and ejection fraction of 55-60%.  We began Zometa with cycle 2 on 08/19/2020.  This is to be repeated every 12 weeks.   REVIEW OF SYSTEMS: Kim Archer continues to tolerate treatment generally well.  She does have allergies, with tearing and a stuffy nose.  She takes Claritin every morning and she takes Xyzal in the evening.  She has several bowel movements daily and is having some hemorrhoid pain associated with that.  Peripheral neuropathy symptoms are absent.  She is considering using essential oils for some skin discomfort in her legs which are not related to her chemo.  A detailed review of systems today was otherwise benign  COVID 19 VACCINATION STATUS:  not vaccinated; infection 10/2019   HISTORY OF CURRENT ILLNESS: From the original intake note:  Kim Archer "Kim Archer" [facility]) was previously seen in the high risk breast cancer clinic on 05/11/2016. She had undergone left lumpectomy on 03/22/2016 showing atypical lobular hyperplasia. She was prescribed tamoxifen at that time, which she believes she took for two years. [She underwent hysterectomy with bilateral salpingo-oophorectomy 03/28/2019, pathology showing leiomyomas and adenomyosis, and may have stopped the tamoxifen around that time].  More recently, she had routine screening mammography on 06/11/2020 showing an area of asymmetry in the right breast. She underwent right diagnostic mammography with tomography and right breast ultrasonography at [facility] on 06/20/2020 showing: breast density category C; 2.4 cm right breast mass at 7 o'clock; one abnormal right axillary lymph node measuring 1.5 cm.  Accordingly on 06/25/2020 she proceeded to biopsy of the right breast area in question. The pathology from this procedure ([id number]) showed: invasive mammary carcinoma, e-cadherin positive, grade 2 or 3. Prognostic indicators significant for: estrogen receptor, 40% positive and progesterone receptor, >95% positive, both with strong staining intensity. Proliferation marker Ki67 at 40%. HER2 positive by immunohistochemistry (3+).  Biopsy of the right axillary lymph node performed at the same time showed mammary carcinoma, with no residual lymph node tissue present.  Cancer Staging Malignant neoplasm of lower-outer quadrant of right breast of female, estrogen receptor positive ([place]) Staging form: Breast, AJCC 8th Edition - Clinical stage from 07/02/2020: Stage IV (cT2, cN1, cM1, G3, ER+, PR+, HER2+) - Signed by Chauncey Cruel, MD on 08/04/2020 Stage prefix: Initial diagnosis Histologic grading system: 3 grade system Laterality: Right Staged by: Pathologist and managing physician Stage  used in treatment  planning: Yes National guidelines used in treatment planning: Yes Type of national guideline used in treatment planning: NCCN  The patient's subsequent history is as detailed below.   PAST MEDICAL HISTORY: Past Medical History:  Diagnosis Date   Allergy    Anemia    due to heavy periods   Atypical ductal hyperplasia of left breast    History of kidney stones    HSV-2 (herpes simplex virus 2) infection    PONV (postoperative nausea and vomiting)    right breast ca 06/2020   Breast cancer    PAST SURGICAL HISTORY: Past Surgical History:  Procedure Laterality Date   BREAST CYST EXCISION Left 03/22/2016   ALH-BENIGN   BREAST LUMPECTOMY WITH RADIOACTIVE SEED LOCALIZATION Left 03/22/2016   Procedure: LEFT BREAST LUMPECTOMY WITH RADIOACTIVE SEED LOCALIZATION;  Surgeon: Autumn Messing III, MD;  Location: [facility];  Service: General;  Laterality: Left;   CESAREAN SECTION     x3   COMBINED HYSTEROSCOPY DIAGNOSTIC / D&C  07/17/2018   EYE SURGERY     lt lens replacement   PORTACATH PLACEMENT Left 07/07/2020   Procedure: INSERTION PORT-A-CATH;  Surgeon: Jovita Kussmaul, MD;  Location: [facility];  Service: General;  Laterality: Left;   TUBAL LIGATION      FAMILY HISTORY: Family History  Problem Relation Age of Onset   Breast cancer Mother    Melanoma Father    Leukemia Sister    Liver cancer Brother        stomach   Breast cancer Maternal Aunt    Colon cancer Neg Hx    Colon polyps Neg Hx    Esophageal cancer Neg Hx    Rectal cancer Neg Hx    Stomach cancer Neg Hx   The patient's father died at age 12. He had a history of melanoma. He has a brother, the patient's uncle, with prostate cancer. The patient's mother died in her 51s from urosepsis. She had a history of breast cancer diagnosed in her 29s. The patient has a brother, with cerebral palsy, who had "stomach cancer" metastatic to the liver. The patient has 2 sisters. One of them died from childhood  leukemia.    GYNECOLOGIC HISTORY:  No LMP recorded (lmp unknown). Patient has had a hysterectomy. Menarche: 55 years old Age at first live birth: 55 years old [place] P 3 LMP 03/2019, with hysterectomy Contraceptive: used remotely for many years with no complications HRT never used  Hysterectomy? Yes, 03/2019 (path in Epic) BSO? 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 Her husband [name] (goes by "[name]") is Airline pilot for [employer], which manages apartment complexes. The patient's daughter Kearney Hard, age 39, works as a Theme park manager in [place]. She has 3 children. She, her husband, and their three children are living with [place] and [place]. The patient's daughter Moody Bruins, age 44, lives in [place]. Kim Archer has legal custody of one of Elina's children. The patient's third child, Quillian Quince "Dalbert Mayotte, 56 years old, also lives in [place] and works in Architect. Patient has in total 8 grandchildren. She is a Psychologist, forensic.    ADVANCED DIRECTIVES: In the absence of any documentation to the contrary, the patient's spouse is their HCPOA.    HEALTH MAINTENANCE: Social History   Tobacco Use   Smoking status: Never   Smokeless tobacco: Never  Vaping Use   Vaping Use: Never used  Substance Use Topics  Alcohol use: No   Drug use: No     Colonoscopy: 08/2018 (Dr. Ardis Hughs), recall 2030  PAP: 05/2020  Bone density: 05/2019 (Dr. Christen Butter office)   Allergies  Allergen Reactions   Ciprofloxacin Other (See Comments)    Patient came into office with complaints of difficulty swallowing.  Ate at [employer] prior and took Cipro prior   Sulfa Antibiotics Rash    Current Outpatient Medications  Medication Sig Dispense Refill   hydrocortisone (ANUSOL-HC) 25 MG suppository Place 1 suppository (25 mg total) rectally daily as needed for hemorrhoids or anal itching. 12 suppository 0    LORazepam (ATIVAN) 0.5 MG tablet Take 1 tablet (0.5 mg total) by mouth every 8 (eight) hours as needed for anxiety (May increase to 2 tablets if needed). (Patient not taking: Reported on 07/24/2020) 30 tablet 1   tobramycin-dexamethasone (TOBRADEX) ophthalmic solution Place 1 drop into both eyes in the morning and at bedtime. 5 mL 0   Boron 3 MG CAPS Take by mouth.     Calcium 500 MG tablet 1,000 mg.     dexamethasone (DECADRON) 4 MG tablet Take 2 tablets (8 mg total) by mouth 2 (two) times daily. Start the day before Taxotere. Then take daily x 3 days after chemotherapy. 30 tablet 1   dexamethasone (DECADRON) 4 MG tablet Take 2 tablets (8 mg total) by mouth 2 (two) times daily. Start the day before Taxotere. Then take daily x 3 days after chemotherapy. 30 tablet 1   Diethylpropion HCl CR 75 MG TB24 diethylpropion ER 75 mg tablet,extended release  TAKE 1 TABLET BY MOUTH EVERY DAY     doxycycline (VIBRA-TABS) 100 MG tablet Take 1 tablet (100 mg total) by mouth 2 (two) times daily. 10 tablet 0   levocetirizine (XYZAL) 5 MG tablet Take 5 mg by mouth every evening.     lidocaine-prilocaine (EMLA) cream Apply to affected area once 30 g 3   loratadine (CLARITIN) 10 MG tablet Take 10 mg by mouth daily.     LUTEIN PO Take by mouth.     MAGNESIUM PO Take 1 tablet by mouth at bedtime.     Menatetrenone (VITAMIN K2) 100 MCG TABS Take by mouth.     Multiple Vitamin (MULTIVITAMIN WITH MINERALS) TABS tablet Take 1 tablet by mouth daily.     prochlorperazine (COMPAZINE) 10 MG tablet Take 1 tablet (10 mg total) by mouth every 6 (six) hours as needed (Nausea or vomiting). 30 tablet 1   prochlorperazine (COMPAZINE) 10 MG tablet Take 1 tablet (10 mg total) by mouth every 6 (six) hours as needed (Nausea or vomiting). 30 tablet 1   Ubiquinol 50 MG CAPS Take by mouth daily.     valACYclovir (VALTREX) 500 MG tablet  (Patient not taking: Reported on 07/24/2020)  12   VITAMIN D PO Vitamin D     zinc sulfate 220 (50 Zn) MG  capsule Take 220 mg by mouth daily.     No current facility-administered medications for this visit.    OBJECTIVE: White woman in no acute distress  Vitals:   09/09/20 0823  BP: (!) 122/59  Pulse: 72  Resp: 16  Temp: 97.6 F (36.4 C)  SpO2: 99%       Body mass index is 26.45 kg/m.   Wt Readings from Last 3 Encounters:  09/09/20 168 lb 14.4 oz (76.6 kg)  08/19/20 170 lb (77.1 kg)  08/04/20 166 lb 12.8 oz (75.7 kg)     ECOG FS:1 - Symptomatic  but completely ambulatory  Sclerae unicteric, EOMs intact Wearing a mask No cervical or supraclavicular adenopathy Lungs no rales or rhonchi Heart regular rate and rhythm Abd soft, nontender, positive bowel sounds MSK no focal spinal tenderness, no upper extremity lymphedema Neuro: nonfocal, well oriented, appropriate affect Breasts: The right breast mass is no longer palpable.  There are no skin or nipple changes of concern.  Both axillae are benign   LAB RESULTS:  CMP     Component Value Date/Time   NA 141 09/09/2020 0800   NA 142 05/11/2016 1508   K 3.8 09/09/2020 0800   K 4.3 05/11/2016 1508   CL 109 09/09/2020 0800   CO2 20 (L) 09/09/2020 0800   CO2 26 05/11/2016 1508   GLUCOSE 205 (H) 09/09/2020 0800   GLUCOSE 90 05/11/2016 1508   BUN 13 09/09/2020 0800   BUN 10.9 05/11/2016 1508   CREATININE 0.78 09/09/2020 0800   CREATININE 0.77 08/04/2020 1540   CREATININE 1.0 05/11/2016 1508   CALCIUM 9.0 09/09/2020 0800   CALCIUM 9.5 05/11/2016 1508   PROT 7.1 09/09/2020 0800   PROT 7.0 05/11/2016 1508   ALBUMIN 4.1 09/09/2020 0800   ALBUMIN 3.9 05/11/2016 1508   AST 24 09/09/2020 0800   AST 29 08/04/2020 1540   AST 19 05/11/2016 1508   ALT 59 (H) 09/09/2020 0800   ALT 28 08/04/2020 1540   ALT 23 05/11/2016 1508   ALKPHOS 115 09/09/2020 0800   ALKPHOS 98 05/11/2016 1508   BILITOT 0.4 09/09/2020 0800   BILITOT 0.3 08/04/2020 1540   BILITOT 0.36 05/11/2016 1508   GFRNONAA >60 09/09/2020 0800   GFRNONAA >60  08/04/2020 1540    No results found for: TOTALPROTELP, ALBUMINELP, A1GS, A2GS, BETS, BETA2SER, GAMS, MSPIKE, SPEI  Lab Results  Component Value Date   WBC 11.9 (H) 09/09/2020   NEUTROABS 10.9 (H) 09/09/2020   HGB 12.0 09/09/2020   HCT 36.4 09/09/2020   MCV 96.3 09/09/2020   PLT 239 09/09/2020    No results found for: LABCA2  No components found for: [id number]  No results for input(s): INR in the last 168 hours.  No results found for: LABCA2  No results found for: YQM578  No results found for: ION629  No results found for: BMW413  Lab Results  Component Value Date   CA2729 37.8 08/21/2020    No components found for: HGQUANT  No results found for: CEA1 / No results found for: CEA1   No results found for: AFPTUMOR  No results found for: CHROMOGRNA  No results found for: KPAFRELGTCHN, LAMBDASER, KAPLAMBRATIO (kappa/lambda light chains)  No results found for: HGBA, HGBA2QUANT, HGBFQUANT, HGBSQUAN (Hemoglobinopathy evaluation)   No results found for: LDH  No results found for: IRON, TIBC, IRONPCTSAT (Iron and TIBC)  No results found for: FERRITIN  Urinalysis    Component Value Date/Time   COLORURINE YELLOW 08/21/2020 1335   APPEARANCEUR CLEAR 08/21/2020 1335   LABSPEC 1.009 08/21/2020 1335   PHURINE 7.0 08/21/2020 1335   GLUCOSEU NEGATIVE 08/21/2020 1335   HGBUR NEGATIVE 08/21/2020 1335   BILIRUBINUR NEGATIVE 08/21/2020 1335   KETONESUR NEGATIVE 08/21/2020 1335   PROTEINUR NEGATIVE 08/21/2020 1335   NITRITE NEGATIVE 08/21/2020 1335   LEUKOCYTESUR TRACE (A) 08/21/2020 1335     STUDIES: MR Brain W Wo Contrast  Result Date: 08/13/2020 CLINICAL DATA:  Breast cancer staging.  Headache. EXAM: MRI HEAD WITHOUT AND WITH CONTRAST TECHNIQUE: Multiplanar, multiecho pulse sequences of the brain and surrounding structures were obtained without and  with intravenous contrast. CONTRAST:  23m GADAVIST GADOBUTROL 1 MMOL/ML IV SOLN COMPARISON:  None. FINDINGS: Brain:  Negative for metastatic disease.  No enhancing lesions. Ventricle size and cerebral volume normal. Few small deep white matter hyperintensities bilaterally. Negative for acute infarct or hemorrhage. Vascular: Normal arterial flow voids Skull and upper cervical spine: No focal skeletal lesion. Sinuses/Orbits: Negative Other: Multiple round well-circumscribed scalp lesion most likely Pilar cysts. IMPRESSION: Negative for metastatic disease to the brain. Electronically Signed   By: CFranchot GalloM.D.   On: 08/13/2020 07:27      ELIGIBLE FOR AVAILABLE RESEARCH PROTOCOL: No  ASSESSMENT: 55y[place]  LEFT BREAST ALH (1) status post left lumpectomy 03/22/2016 showing atypical lobular hyperplasia  (2) prophylactic tamoxifen taken from 04/2016 through 03/2019  (a) status post hysterectomy (without bilateral salpingo-oophorectomy) February 2021  (3) genetic testing 05/22/2019 through the my risk hereditary cancer panel offered by [facility] found no deleterious mutations in BRCA, TP53, PALB2 or any of the other genes tested  (a) variants of uncertain significance were found in BRIP1 and NBN  RIGHT BREAST CANCER: METASTATIC DISEASE June 2022 (4) right breast lower outer quadrant biopsy 06/25/2020 shows a clinical T2 N1, stage IB invasive ductal carcinoma, grade 2 or 3, triple positive, with an MIB-1 of 40%.  (A) breast MRI 07/07/2020 shows mcT2 N1 disease as well as an enhancing lesion in the sternum (B) non-contrast Chest CT scan 07/23/2020 shows right axillary and retropetoral adenopathy, also several liver hypodensities and very small lytic/sclerotic bone lesions  (C) bone scan 07/25/2020 shows abnormal uptake at T8, T10, midline lower cervical spine, superior LEFT manubrium and posterior LEFT ninth rib  (D) abdominal MRI with and w/o conttrast 07/26/2020 shows a right hepatic 1.2 cm indeterminate lesion, no other lesions of concern in the liver; bone lesions re-demonstrated, larget at  T10  (E) PET scan 08/01/2020 shows no hypermetabolism in the 1.2 cm right liver lesion; bony hypermetabolism consistent with known mets noted  (F) biopsy T10 to be performed at the end of chemotherapy  (G) brain MRI 08/12/2020 negative (H) CA 27-29 on 08/19/2020 NONINFORMATIVE  (5) neoadjuvant chemotherapy consisting of docetaxel, carboplatin, trastuzumab and pertuzumab every 21 days x 4-6 cycles started 07/28/2020  (6) anti-HER2 treatment to be continued indefinitely  (a) echocardiogram on 07/03/2020 shows EF of 55-60%  (7) anastrozole/ abemaciclib to start at the completion of chemotherapy  (8) zolendronate started 08/19/2020, to be repeated every 12 weeks   PLAN: KSteffanie Dunncontinues to tolerate her chemotherapy well and certainly has not developed any peripheral neuropathy symptoms.  We are proceeding with the third cycle today.  After cycle 4 I will obtain a repeat breast MRI and consider switching to anti-HER2 treatments only or continuing through a total of 6 cycles depending on those results.  She is aware of this.  She wanted to know whether or not she will eventually have surgery and radiation as we originally planned.  She understands that stage IV disease is not curable and we she might be able to get away with no surgery and radiation and simply continuing close follow-up of that area assuming very good MRI results from this treatment.  I wrote her for Anusol suppositories for the discomfort she is having with defecation and also TobraDex eyedrops for the tearing which however may be related to allergies more than blockage of the tear ducts.  Recall her son's wedding is October 8 and her own birthday is [birth year].  She would like  to feel "good" for those locations  Total encounter time 30 minutes.Sarajane Jews C. Sylar Voong, MD 09/09/20 9:02 AM Medical Oncology and Hematology [facility] [place], [place] [address] Tel. [phone]    Fax.  [phone]   I, Wilburn Mylar, am acting as scribe for Dr. Virgie Dad. Naidelyn Parrella.  I, Lurline Del MD, have reviewed the above documentation for accuracy and completeness, and I agree with the above.   *Total Encounter Time as defined by the Centers for Medicare and Medicaid Services includes, in addition to the face-to-face time of a patient visit (documented in the note above) non-face-to-face time: obtaining and reviewing outside history, ordering and reviewing medications, tests or procedures, care coordination (communications with other health care professionals or caregivers) and documentation in the medical record.

## 2020-09-09 ENCOUNTER — Other Ambulatory Visit: Payer: Self-pay

## 2020-09-09 ENCOUNTER — Inpatient Hospital Stay: Payer: 59

## 2020-09-09 ENCOUNTER — Inpatient Hospital Stay (HOSPITAL_BASED_OUTPATIENT_CLINIC_OR_DEPARTMENT_OTHER): Payer: 59 | Admitting: Oncology

## 2020-09-09 VITALS — BP 122/59 | HR 72 | Temp 97.6°F | Resp 16 | Ht 67.0 in | Wt 168.9 lb

## 2020-09-09 DIAGNOSIS — C50511 Malignant neoplasm of lower-outer quadrant of right female breast: Secondary | ICD-10-CM

## 2020-09-09 DIAGNOSIS — Z7189 Other specified counseling: Secondary | ICD-10-CM | POA: Diagnosis not present

## 2020-09-09 DIAGNOSIS — Z170 Estrogen receptor positive status [ER+]: Secondary | ICD-10-CM

## 2020-09-09 DIAGNOSIS — C7951 Secondary malignant neoplasm of bone: Secondary | ICD-10-CM

## 2020-09-09 DIAGNOSIS — Z5112 Encounter for antineoplastic immunotherapy: Secondary | ICD-10-CM | POA: Diagnosis not present

## 2020-09-09 LAB — CBC WITH DIFFERENTIAL/PLATELET
Abs Immature Granulocytes: 0.08 10*3/uL — ABNORMAL HIGH (ref 0.00–0.07)
Basophils Absolute: 0 10*3/uL (ref 0.0–0.1)
Basophils Relative: 0 %
Eosinophils Absolute: 0 10*3/uL (ref 0.0–0.5)
Eosinophils Relative: 0 %
HCT: 36.4 % (ref 36.0–46.0)
Hemoglobin: 12 g/dL (ref 12.0–15.0)
Immature Granulocytes: 1 %
Lymphocytes Relative: 7 %
Lymphs Abs: 0.8 10*3/uL (ref 0.7–4.0)
MCH: 31.7 pg (ref 26.0–34.0)
MCHC: 33 g/dL (ref 30.0–36.0)
MCV: 96.3 fL (ref 80.0–100.0)
Monocytes Absolute: 0.2 10*3/uL (ref 0.1–1.0)
Monocytes Relative: 2 %
Neutro Abs: 10.9 10*3/uL — ABNORMAL HIGH (ref 1.7–7.7)
Neutrophils Relative %: 90 %
Platelets: 239 10*3/uL (ref 150–400)
RBC: 3.78 MIL/uL — ABNORMAL LOW (ref 3.87–5.11)
RDW: 16.1 % — ABNORMAL HIGH (ref 11.5–15.5)
WBC: 11.9 10*3/uL — ABNORMAL HIGH (ref 4.0–10.5)
nRBC: 0 % (ref 0.0–0.2)

## 2020-09-09 LAB — COMPREHENSIVE METABOLIC PANEL
ALT: 59 U/L — ABNORMAL HIGH (ref 0–44)
AST: 24 U/L (ref 15–41)
Albumin: 4.1 g/dL (ref 3.5–5.0)
Alkaline Phosphatase: 115 U/L (ref 38–126)
Anion gap: 12 (ref 5–15)
BUN: 13 mg/dL (ref 6–20)
CO2: 20 mmol/L — ABNORMAL LOW (ref 22–32)
Calcium: 9 mg/dL (ref 8.9–10.3)
Chloride: 109 mmol/L (ref 98–111)
Creatinine, Ser: 0.78 mg/dL (ref 0.44–1.00)
GFR, Estimated: >60 mL/min (ref >60)
Glucose, Bld: 205 mg/dL — ABNORMAL HIGH (ref 70–99)
Potassium: 3.8 mmol/L (ref 3.5–5.1)
Sodium: 141 mmol/L (ref 135–145)
Total Bilirubin: 0.4 mg/dL (ref 0.3–1.2)
Total Protein: 7.1 g/dL (ref 6.5–8.1)

## 2020-09-09 MED ORDER — DIPHENHYDRAMINE HCL 25 MG PO CAPS
25.0000 mg | ORAL_CAPSULE | Freq: Once | ORAL | Status: AC
  Administered 2020-09-09: 25 mg via ORAL

## 2020-09-09 MED ORDER — ACETAMINOPHEN 325 MG PO TABS
650.0000 mg | ORAL_TABLET | Freq: Once | ORAL | Status: AC
  Administered 2020-09-09: 650 mg via ORAL

## 2020-09-09 MED ORDER — SODIUM CHLORIDE 0.9 % IV SOLN
150.0000 mg | Freq: Once | INTRAVENOUS | Status: AC
  Administered 2020-09-09: 150 mg via INTRAVENOUS
  Filled 2020-09-09: qty 150

## 2020-09-09 MED ORDER — SODIUM CHLORIDE 0.9% FLUSH
10.0000 mL | Freq: Once | INTRAVENOUS | Status: AC
  Administered 2020-09-09: 10 mL via INTRAVENOUS
  Filled 2020-09-09: qty 10

## 2020-09-09 MED ORDER — PALONOSETRON HCL INJECTION 0.25 MG/5ML
INTRAVENOUS | Status: AC
  Filled 2020-09-09: qty 5

## 2020-09-09 MED ORDER — TRASTUZUMAB-ANNS CHEMO 150 MG IV SOLR
6.0000 mg/kg | Freq: Once | INTRAVENOUS | Status: AC
  Administered 2020-09-09: 462 mg via INTRAVENOUS
  Filled 2020-09-09: qty 22

## 2020-09-09 MED ORDER — SODIUM CHLORIDE 0.9 % IV SOLN
420.0000 mg | Freq: Once | INTRAVENOUS | Status: AC
  Administered 2020-09-09: 420 mg via INTRAVENOUS
  Filled 2020-09-09: qty 14

## 2020-09-09 MED ORDER — SODIUM CHLORIDE 0.9 % IV SOLN
75.0000 mg/m2 | Freq: Once | INTRAVENOUS | Status: AC
  Administered 2020-09-09: 140 mg via INTRAVENOUS
  Filled 2020-09-09: qty 14

## 2020-09-09 MED ORDER — SODIUM CHLORIDE 0.9 % IV SOLN
600.0000 mg | Freq: Once | INTRAVENOUS | Status: AC
  Administered 2020-09-09: 600 mg via INTRAVENOUS
  Filled 2020-09-09: qty 60

## 2020-09-09 MED ORDER — PALONOSETRON HCL INJECTION 0.25 MG/5ML
0.2500 mg | Freq: Once | INTRAVENOUS | Status: AC
  Administered 2020-09-09: 0.25 mg via INTRAVENOUS

## 2020-09-09 MED ORDER — HYDROCORTISONE ACETATE 25 MG RE SUPP: 25.0000 mg | Freq: Every day | RECTAL | 0 refills | Status: DC | PRN

## 2020-09-09 MED ORDER — SODIUM CHLORIDE 0.9% FLUSH
10.0000 mL | INTRAVENOUS | Status: DC | PRN
  Administered 2020-09-09: 10 mL
  Filled 2020-09-09: qty 10

## 2020-09-09 MED ORDER — TOBRAMYCIN-DEXAMETHASONE 0.3-0.1 % OP SUSP: 1.0000 [drp] | Freq: Two times a day (BID) | OPHTHALMIC | 0 refills | Status: DC

## 2020-09-09 MED ORDER — HEPARIN SOD (PORK) LOCK FLUSH 100 UNIT/ML IV SOLN
500.0000 [IU] | Freq: Once | INTRAVENOUS | Status: AC | PRN
  Administered 2020-09-09: 500 [IU]
  Filled 2020-09-09: qty 5

## 2020-09-09 MED ORDER — SODIUM CHLORIDE 0.9 % IV SOLN
10.0000 mg | Freq: Once | INTRAVENOUS | Status: AC
  Administered 2020-09-09: 10 mg via INTRAVENOUS
  Filled 2020-09-09: qty 10

## 2020-09-09 MED ORDER — SODIUM CHLORIDE 0.9 % IV SOLN
Freq: Once | INTRAVENOUS | Status: AC
  Filled 2020-09-09: qty 250

## 2020-09-09 MED ORDER — DIPHENHYDRAMINE HCL 25 MG PO CAPS
ORAL_CAPSULE | ORAL | Status: AC
  Filled 2020-09-09: qty 1

## 2020-09-09 MED ORDER — ACETAMINOPHEN 325 MG PO TABS
ORAL_TABLET | ORAL | Status: AC
  Filled 2020-09-09: qty 2

## 2020-09-09 NOTE — Patient Instructions (Signed)
[facility]   Discharge Instructions: Thank you for choosing [facility] to provide your oncology and hematology care.   If you have a lab appointment with the [facility], please go directly to the [facility] and check in at the registration area.   Wear comfortable clothing and clothing appropriate for easy access to any Portacath or PICC line.   We strive to give you quality time with your provider. You may need to reschedule your appointment if you arrive late (15 or more minutes).  Arriving late affects you and other patients whose appointments are after yours.  Also, if you miss three or more appointments without notifying the office, you may be dismissed from the clinic at the provider's discretion.      For prescription refill requests, have your pharmacy contact our office and allow 72 hours for refills to be completed.    Today you received the following chemotherapy and/or immunotherapy agents: trastuzumab, pertuzumab, docetaxel, and carboplatin.      To help prevent nausea and vomiting after your treatment, we encourage you to take your nausea medication as directed.  BELOW ARE SYMPTOMS THAT SHOULD BE REPORTED IMMEDIATELY: *FEVER GREATER THAN 100.4 F (38 C) OR HIGHER *CHILLS OR SWEATING *NAUSEA AND VOMITING THAT IS NOT CONTROLLED WITH YOUR NAUSEA MEDICATION *UNUSUAL SHORTNESS OF BREATH *UNUSUAL BRUISING OR BLEEDING *URINARY PROBLEMS (pain or burning when urinating, or frequent urination) *BOWEL PROBLEMS (unusual diarrhea, constipation, pain near the anus) TENDERNESS IN MOUTH AND THROAT WITH OR WITHOUT PRESENCE OF ULCERS (sore throat, sores in mouth, or a toothache) UNUSUAL RASH, SWELLING OR PAIN  UNUSUAL VAGINAL DISCHARGE OR ITCHING   Items with * indicate a potential emergency and should be followed up as soon as possible or go to the Emergency Department if any problems should occur.  Please show the CHEMOTHERAPY ALERT  CARD or IMMUNOTHERAPY ALERT CARD at check-in to the Emergency Department and triage nurse.  Should you have questions after your visit or need to cancel or reschedule your appointment, please contact [facility]  Dept: [phone]  and follow the prompts.  Office hours are 8:00 a.m. to 4:30 p.m. Monday - Friday. Please note that voicemails left after 4:00 p.m. may not be returned until the following business day.  We are closed weekends and major holidays. You have access to a nurse at all times for urgent questions. Please call the main number to the clinic Dept: [phone] and follow the prompts.   For any non-urgent questions, you may also contact your provider using MyChart. We now offer e-Visits for anyone 66 and older to request care online for non-urgent symptoms. For details visit [email or web address].   Also download the MyChart app! Go to the app store, search "MyChart", open the app, select [facility], and log in with your MyChart username and password.  Due to Covid, a mask is required upon entering the hospital/clinic. If you do not have a mask, one will be given to you upon arrival. For doctor visits, patients may have 1 support person aged 65 or older with them. For treatment visits, patients cannot have anyone with them due to current Covid guidelines and our immunocompromised population.

## 2020-09-11 ENCOUNTER — Inpatient Hospital Stay: Payer: 59

## 2020-09-11 ENCOUNTER — Ambulatory Visit: Payer: 59

## 2020-09-11 ENCOUNTER — Other Ambulatory Visit: Payer: Self-pay

## 2020-09-11 VITALS — BP 127/72 | HR 60 | Temp 98.1°F | Resp 18

## 2020-09-11 DIAGNOSIS — Z5112 Encounter for antineoplastic immunotherapy: Secondary | ICD-10-CM | POA: Diagnosis not present

## 2020-09-11 DIAGNOSIS — C50511 Malignant neoplasm of lower-outer quadrant of right female breast: Secondary | ICD-10-CM

## 2020-09-11 MED ORDER — PEGFILGRASTIM-BMEZ 6 MG/0.6ML ~~LOC~~ SOSY
PREFILLED_SYRINGE | SUBCUTANEOUS | Status: AC
  Filled 2020-09-11: qty 0.6

## 2020-09-11 MED ORDER — PEGFILGRASTIM-BMEZ 6 MG/0.6ML ~~LOC~~ SOSY
6.0000 mg | PREFILLED_SYRINGE | Freq: Once | SUBCUTANEOUS | Status: AC
  Administered 2020-09-11: 6 mg via SUBCUTANEOUS

## 2020-09-11 NOTE — Patient Instructions (Signed)

## 2020-09-29 NOTE — Progress Notes (Signed)
[place]  [phone] [phone]    ID: Kim Archer DOB: October 12, 1965  MR#: [id number]  TTS#:[id number]  Patient Care Team: [name], MD as PCP - General (Obstetrics and Gynecology) Shaylea Ucci, Virgie Dad, MD as Consulting Physician (Oncology) Jovita Kussmaul, MD as Consulting Physician (General Surgery) [name], MD as Consulting Physician (Obstetrics and Gynecology) Mauro Kaufmann, RN as Oncology Nurse Navigator Rockwell Germany, RN as Oncology Nurse Navigator Jovita Kussmaul, MD as Consulting Physician (General Surgery) Macaria Bias, Virgie Dad, MD as Consulting Physician (Oncology) Gery Pray, MD as Consulting Physician (Radiation Oncology) Raina Mina, RPH-CPP (Pharmacist) Chauncey Cruel, MD OTHER MD:  CHIEF COMPLAINT: Triple positive breast cancer  CURRENT TREATMENT: Neoadjuvant chemotherapy; denosumab/Xgeva   INTERVAL HISTORY: Kim Archer returns today for follow up and treatment of her triple positive breast cancer.  She is accompanied by her husband [name].  She began neoadjuvant chemotherapy, consisting of Docetaxel, Carboplatin, trastuzumab and Pertuzumab given on day 1 of a 21 day cycle with Neulasta or biosimilar given on day 3, on 07/28/2020. She is here today for cycle 4 day 1.  Her most recent echocardiogram was completed on 07/03/2020 showing and ejection fraction of 55-60%.  She is due for repeat.  We began Zometa with cycle 2 on 08/19/2020.  This is to be repeated every 12 weeks.   REVIEW OF SYSTEMS: Kim Archer continues to tolerate treatment moderately well but she is pretty much "ready to stop the chemo".  She did get a facial rash again.  This may have been due to sun exposure.  She treated it with cream and some steroids.  She is having bit of a runny nose and wonders if she should see an allergist.  She would like to consult with a dietitian.  A detailed review of systems today was otherwise stable.  COVID 19  VACCINATION STATUS: not vaccinated; infection 10/2019   HISTORY OF CURRENT ILLNESS: From the original intake note:  Kim Archer "Kim Archer" [facility]) was previously seen in the high risk breast cancer clinic on 05/11/2016. She had undergone left lumpectomy on 03/22/2016 showing atypical lobular hyperplasia. She was prescribed tamoxifen at that time, which she believes she took for two years. [She underwent hysterectomy with bilateral salpingo-oophorectomy 03/28/2019, pathology showing leiomyomas and adenomyosis, and may have stopped the tamoxifen around that time].  More recently, she had routine screening mammography on 06/11/2020 showing an area of asymmetry in the right breast. She underwent right diagnostic mammography with tomography and right breast ultrasonography at [facility] on 06/20/2020 showing: breast density category C; 2.4 cm right breast mass at 7 o'clock; one abnormal right axillary lymph node measuring 1.5 cm.  Accordingly on 06/25/2020 she proceeded to biopsy of the right breast area in question. The pathology from this procedure ([id number]) showed: invasive mammary carcinoma, e-cadherin positive, grade 2 or 3. Prognostic indicators significant for: estrogen receptor, 40% positive and progesterone receptor, >95% positive, both with strong staining intensity. Proliferation marker Ki67 at 40%. HER2 positive by immunohistochemistry (3+).  Biopsy of the right axillary lymph node performed at the same time showed mammary carcinoma, with no residual lymph node tissue present.  Cancer Staging Malignant neoplasm of lower-outer quadrant of right breast of female, estrogen receptor positive ([place]) Staging form: Breast, AJCC 8th Edition - Clinical stage from 07/02/2020: Stage IV (cT2, cN1, cM1, G3, ER+, PR+, HER2+) - Signed by Chauncey Cruel, MD on 08/04/2020 Stage prefix: Initial diagnosis Histologic grading system: 3 grade system Laterality:  Right Staged by: Pathologist and  managing physician Stage used in treatment planning: Yes National guidelines used in treatment planning: Yes Type of national guideline used in treatment planning: NCCN  The patient's subsequent history is as detailed below.   PAST MEDICAL HISTORY: Past Medical History:  Diagnosis Date   Allergy    Anemia    due to heavy periods   Atypical ductal hyperplasia of left breast    History of kidney stones    HSV-2 (herpes simplex virus 2) infection    PONV (postoperative nausea and vomiting)    right breast ca 06/2020   Breast cancer    PAST SURGICAL HISTORY: Past Surgical History:  Procedure Laterality Date   BREAST CYST EXCISION Left 03/22/2016   ALH-BENIGN   BREAST LUMPECTOMY WITH RADIOACTIVE SEED LOCALIZATION Left 03/22/2016   Procedure: LEFT BREAST LUMPECTOMY WITH RADIOACTIVE SEED LOCALIZATION;  Surgeon: Autumn Messing III, MD;  Location: [facility];  Service: General;  Laterality: Left;   CESAREAN SECTION     x3   COMBINED HYSTEROSCOPY DIAGNOSTIC / D&C  07/17/2018   EYE SURGERY     lt lens replacement   PORTACATH PLACEMENT Left 07/07/2020   Procedure: INSERTION PORT-A-CATH;  Surgeon: Jovita Kussmaul, MD;  Location: [facility];  Service: General;  Laterality: Left;   TUBAL LIGATION      FAMILY HISTORY: Family History  Problem Relation Age of Onset   Breast cancer Mother    Melanoma Father    Leukemia Sister    Liver cancer Brother        stomach   Breast cancer Maternal Aunt    Colon cancer Neg Hx    Colon polyps Neg Hx    Esophageal cancer Neg Hx    Rectal cancer Neg Hx    Stomach cancer Neg Hx   The patient's father died at age 66. He had a history of melanoma. He has a brother, the patient's uncle, with prostate cancer. The patient's mother died in her 20s from urosepsis. She had a history of breast cancer diagnosed in her 67s. The patient has a brother, with cerebral palsy, who had "stomach cancer" metastatic to the liver. The patient has 2 sisters. One of  them died from childhood leukemia.    GYNECOLOGIC HISTORY:  No LMP recorded (lmp unknown). Patient has had a hysterectomy. Menarche: 55 years old Age at first live birth: 55 years old [place] P 3 LMP 03/2019, with hysterectomy Contraceptive: used remotely for many years with no complications HRT never used  Hysterectomy? Yes, 03/2019 (path in Epic) BSO? 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 Her husband [name] (goes by "[name]") is Airline pilot for [employer], which manages apartment complexes. The patient's daughter Kearney Hard, age 36, works as a Theme park manager in [place]. She has 3 children. She, her husband, and their three children are living with [place] and [place]. The patient's daughter Moody Bruins, age 28, lives in [place]. Kim Archer has legal custody of one of Elina's children. The patient's third child, Quillian Quince "Dalbert Mayotte, 56 years old, also lives in [place] and works in Architect. Patient has in total 8 grandchildren. She is a Psychologist, forensic.    ADVANCED DIRECTIVES: In the absence of any documentation to the contrary, the patient's spouse is their HCPOA.    HEALTH MAINTENANCE: Social History   Tobacco Use   Smoking status: Never   Smokeless tobacco: Never  Vaping  Use   Vaping Use: Never used  Substance Use Topics   Alcohol use: No   Drug use: No     Colonoscopy: 08/2018 (Dr. Ardis Hughs), recall 2030  PAP: 05/2020  Bone density: 05/2019 (Dr. Christen Butter office)   Allergies  Allergen Reactions   Ciprofloxacin Other (See Comments)    Patient came into office with complaints of difficulty swallowing.  Ate at [employer] prior and took Cipro prior   Sulfa Antibiotics Rash    Current Outpatient Medications  Medication Sig Dispense Refill   LORazepam (ATIVAN) 0.5 MG tablet Take 1 tablet (0.5 mg total) by mouth every 8 (eight) hours as needed for anxiety (May increase to  2 tablets if needed). (Patient not taking: Reported on 07/24/2020) 30 tablet 1   Boron 3 MG CAPS Take by mouth.     Calcium 500 MG tablet 1,000 mg.     dexamethasone (DECADRON) 4 MG tablet Take 2 tablets (8 mg total) by mouth 2 (two) times daily. Start the day before Taxotere. Then take daily x 3 days after chemotherapy. 30 tablet 1   dexamethasone (DECADRON) 4 MG tablet Take 2 tablets (8 mg total) by mouth 2 (two) times daily. Start the day before Taxotere. Then take daily x 3 days after chemotherapy. 30 tablet 1   Diethylpropion HCl CR 75 MG TB24 diethylpropion ER 75 mg tablet,extended release  TAKE 1 TABLET BY MOUTH EVERY DAY     doxycycline (VIBRA-TABS) 100 MG tablet Take 1 tablet (100 mg total) by mouth 2 (two) times daily. 10 tablet 0   hydrocortisone (ANUSOL-HC) 25 MG suppository Place 1 suppository (25 mg total) rectally daily as needed for hemorrhoids or anal itching. 12 suppository 0   levocetirizine (XYZAL) 5 MG tablet Take 5 mg by mouth every evening.     lidocaine-prilocaine (EMLA) cream Apply to affected area once 30 g 3   loratadine (CLARITIN) 10 MG tablet Take 10 mg by mouth daily.     LUTEIN PO Take by mouth.     MAGNESIUM PO Take 1 tablet by mouth at bedtime.     Menatetrenone (VITAMIN K2) 100 MCG TABS Take by mouth.     Multiple Vitamin (MULTIVITAMIN WITH MINERALS) TABS tablet Take 1 tablet by mouth daily.     prochlorperazine (COMPAZINE) 10 MG tablet Take 1 tablet (10 mg total) by mouth every 6 (six) hours as needed (Nausea or vomiting). 30 tablet 1   prochlorperazine (COMPAZINE) 10 MG tablet Take 1 tablet (10 mg total) by mouth every 6 (six) hours as needed (Nausea or vomiting). 30 tablet 1   tobramycin-dexamethasone (TOBRADEX) ophthalmic solution Place 1 drop into both eyes in the morning and at bedtime. 5 mL 0   Ubiquinol 50 MG CAPS Take by mouth daily.     valACYclovir (VALTREX) 500 MG tablet  (Patient not taking: Reported on 07/24/2020)  12   VITAMIN D PO Vitamin D     zinc  sulfate 220 (50 Zn) MG capsule Take 220 mg by mouth daily.     No current facility-administered medications for this visit.   Facility-Administered Medications Ordered in Other Visits  Medication Dose Route Frequency Provider Last Rate Last Admin   CARBOplatin (PARAPLATIN) 600 mg in sodium chloride 0.9 % 250 mL chemo infusion  600 mg Intravenous Once Ilina Xu, Virgie Dad, MD       DOCEtaxel (TAXOTERE) 140 mg in sodium chloride 0.9 % 250 mL chemo infusion  75 mg/m2 (Treatment Plan Recorded) Intravenous Once Jerni Selmer, Virgie Dad,  MD       heparin lock flush 100 unit/mL  500 Units Intracatheter Once PRN Azia Toutant, Virgie Dad, MD       pertuzumab (PERJETA) 420 mg in sodium chloride 0.9 % 250 mL chemo infusion  420 mg Intravenous Once Cashawn Yanko, Virgie Dad, MD       sodium chloride flush (NS) 0.9 % injection 10 mL  10 mL Intracatheter PRN Avanish Cerullo, Virgie Dad, MD       trastuzumab-anns [facility]) 462 mg in sodium chloride 0.9 % 250 mL chemo infusion  6 mg/kg (Treatment Plan Recorded) Intravenous Once Palin Tristan, Virgie Dad, MD        OBJECTIVE: White woman in no acute distress  Vitals:   09/30/20 0839  BP: 121/67  Pulse: 77  Resp: 18  Temp: (!) 97.5 F (36.4 C)  SpO2: 96%      Body mass index is 26.61 kg/m.   Wt Readings from Last 3 Encounters:  09/30/20 169 lb 14.4 oz (77.1 kg)  09/09/20 168 lb 14.4 oz (76.6 kg)  08/19/20 170 lb (77.1 kg)     ECOG FS:1 - Symptomatic but completely ambulatory  Sclerae unicteric, EOMs intact Wearing a mask No cervical or supraclavicular adenopathy Lungs no rales or rhonchi Heart regular rate and rhythm Abd soft, nontender, positive bowel sounds MSK no focal spinal tenderness, no upper extremity lymphedema Neuro: nonfocal, well oriented, appropriate affect Breasts: I do not palpate a mass in the right breast.  There is no skin or nipple change of concern.  The left breast and both axillae are benign   LAB RESULTS:  CMP     Component Value Date/Time   NA  142 09/30/2020 0820   NA 142 05/11/2016 1508   K 4.2 09/30/2020 0820   K 4.3 05/11/2016 1508   CL 109 09/30/2020 0820   CO2 21 (L) 09/30/2020 0820   CO2 26 05/11/2016 1508   GLUCOSE 205 (H) 09/30/2020 0820   GLUCOSE 90 05/11/2016 1508   BUN 13 09/30/2020 0820   BUN 10.9 05/11/2016 1508   CREATININE 0.78 09/30/2020 0820   CREATININE 0.77 08/04/2020 1540   CREATININE 1.0 05/11/2016 1508   CALCIUM 9.4 09/30/2020 0820   CALCIUM 9.5 05/11/2016 1508   PROT 6.8 09/30/2020 0820   PROT 7.0 05/11/2016 1508   ALBUMIN 3.9 09/30/2020 0820   ALBUMIN 3.9 05/11/2016 1508   AST 31 09/30/2020 0820   AST 29 08/04/2020 1540   AST 19 05/11/2016 1508   ALT 68 (H) 09/30/2020 0820   ALT 28 08/04/2020 1540   ALT 23 05/11/2016 1508   ALKPHOS 108 09/30/2020 0820   ALKPHOS 98 05/11/2016 1508   BILITOT 0.4 09/30/2020 0820   BILITOT 0.3 08/04/2020 1540   BILITOT 0.36 05/11/2016 1508   GFRNONAA >60 09/30/2020 0820   GFRNONAA >60 08/04/2020 1540    No results found for: TOTALPROTELP, ALBUMINELP, A1GS, A2GS, BETS, BETA2SER, GAMS, MSPIKE, SPEI  Lab Results  Component Value Date   WBC 7.8 09/30/2020   NEUTROABS 7.2 09/30/2020   HGB 12.1 09/30/2020   HCT 36.3 09/30/2020   MCV 97.8 09/30/2020   PLT 241 09/30/2020    No results found for: LABCA2  No components found for: [id number]  No results for input(s): INR in the last 168 hours.  No results found for: LABCA2  No results found for: EHO122  No results found for: CAN125  No results found for: QMG500  Lab Results  Component Value Date   CA2729 37.8 08/21/2020  No components found for: HGQUANT  No results found for: CEA1 / No results found for: CEA1   No results found for: AFPTUMOR  No results found for: CHROMOGRNA  No results found for: KPAFRELGTCHN, LAMBDASER, KAPLAMBRATIO (kappa/lambda light chains)  No results found for: HGBA, HGBA2QUANT, HGBFQUANT, HGBSQUAN (Hemoglobinopathy evaluation)   No results found for:  LDH  No results found for: IRON, TIBC, IRONPCTSAT (Iron and TIBC)  No results found for: FERRITIN  Urinalysis    Component Value Date/Time   COLORURINE YELLOW 08/21/2020 1335   APPEARANCEUR CLEAR 08/21/2020 1335   LABSPEC 1.009 08/21/2020 1335   PHURINE 7.0 08/21/2020 1335   GLUCOSEU NEGATIVE 08/21/2020 1335   HGBUR NEGATIVE 08/21/2020 1335   BILIRUBINUR NEGATIVE 08/21/2020 1335   KETONESUR NEGATIVE 08/21/2020 1335   PROTEINUR NEGATIVE 08/21/2020 1335   NITRITE NEGATIVE 08/21/2020 1335   LEUKOCYTESUR TRACE (A) 08/21/2020 1335    STUDIES: No results found.    ELIGIBLE FOR AVAILABLE RESEARCH PROTOCOL: No  ASSESSMENT: 55 y.o. [place], [place] woman  LEFT BREAST ALH (1) status post left lumpectomy 03/22/2016 showing atypical lobular hyperplasia  (2) prophylactic tamoxifen taken from 04/2016 through 03/2019  (a) status post hysterectomy (without bilateral salpingo-oophorectomy) February 2021  (3) genetic testing 05/22/2019 through the my risk hereditary cancer panel offered by [facility] found no deleterious mutations in BRCA, TP53, PALB2 or any of the other genes tested  (a) variants of uncertain significance were found in BRIP1 and NBN  RIGHT BREAST CANCER: METASTATIC DISEASE June 2022 (4) right breast lower outer quadrant biopsy 06/25/2020 shows a clinical T2 N1, stage IB invasive ductal carcinoma, grade 2 or 3, triple positive, with an MIB-1 of 40%.  (A) breast MRI 07/07/2020 shows mcT2 N1 disease as well as an enhancing lesion in the sternum (B) non-contrast Chest CT scan 07/23/2020 shows right axillary and retropetoral adenopathy, also several liver hypodensities and very small lytic/sclerotic bone lesions  (C) bone scan 07/25/2020 shows abnormal uptake at T8, T10, midline lower cervical spine, superior LEFT manubrium and posterior LEFT ninth rib  (D) abdominal MRI with and w/o conttrast 07/26/2020 shows a right hepatic 1.2 cm indeterminate lesion, no other lesions of concern in  the liver; bone lesions re-demonstrated, larget at T10  (E) PET scan 08/01/2020 shows no hypermetabolism in the 1.2 cm right liver lesion; bony hypermetabolism consistent with known mets noted  (F) biopsy T10 to be performed at the end of chemotherapy  (G) brain MRI 08/12/2020 negative (H) CA 27-29 on 08/19/2020 NONINFORMATIVE  (5) neoadjuvant chemotherapy consisting of docetaxel, carboplatin, trastuzumab and pertuzumab every 21 days x 4-6 cycles started 07/28/2020  (6) anti-HER2 treatment to be continued indefinitely  (a) echocardiogram on 07/03/2020 shows EF of 55-60%  (7) anastrozole/ abemaciclib to start at the completion of chemotherapy  (8) zolendronate started 08/19/2020, to be repeated every 12 weeks   PLAN: Kim Archer is willing to receive this fourth cycle of chemo but she is pretty much done with chemo she says.  She would like [place] to strongly consider discontinuing it.  Of course we initially discussed that we would reassess after 4 cycles.  I am setting her up for a breast MRI and a total spinal MRI.  She is also due for repeat echocardiography.  She is not looking forward to a bone biopsy although at some point we will have to do that to document stage IV disease.  Based on repeat studies we will make a definitive decision at the next visit.  If we do decide  to stop chemo of course she understands we are going to continue the trastuzumab and pertuzumab indefinitely and she is going to keep her port indefinitely.  We can then start antiestrogens with or without CDK inhibitors depending on the results of her MRI.  I gave her a copy of the information on antiestrogens for her to review.  She keeps getting a facial rash which I think is going to be sun related.  I have asked her to stay out of the sun for the next 3 weeks to see if we can prevent that.  She requested referral to an allergist but I think some of the symptoms she is having are really due to chemo and when we stopped the  chemo they will resolve.  Same goes for some of the blurred vision she is receiving.  She is already scheduled to see me next treatment in 3 weeks.  She will at the very least receive the Herceptin and Perjeta at that time  Total encounter time 35 minutes.Sarajane Jews C. Aria Jarrard, MD 09/30/20 10:14 AM Medical Oncology and Hematology [facility] [place], [place] [address] Tel. [phone]    Fax. [phone]   I, Wilburn Mylar, am acting as scribe for Dr. Virgie Dad. Mareon Robinette.  I, Lurline Del MD, have reviewed the above documentation for accuracy and completeness, and I agree with the above.   *Total Encounter Time as defined by the Centers for Medicare and Medicaid Services includes, in addition to the face-to-face time of a patient visit (documented in the note above) non-face-to-face time: obtaining and reviewing outside history, ordering and reviewing medications, tests or procedures, care coordination (communications with other health care professionals or caregivers) and documentation in the medical record.

## 2020-09-30 ENCOUNTER — Encounter: Payer: Self-pay | Admitting: *Deleted

## 2020-09-30 ENCOUNTER — Inpatient Hospital Stay: Payer: 59 | Attending: Oncology

## 2020-09-30 ENCOUNTER — Other Ambulatory Visit: Payer: Self-pay

## 2020-09-30 ENCOUNTER — Inpatient Hospital Stay (HOSPITAL_BASED_OUTPATIENT_CLINIC_OR_DEPARTMENT_OTHER): Payer: 59 | Admitting: Oncology

## 2020-09-30 ENCOUNTER — Inpatient Hospital Stay: Payer: 59

## 2020-09-30 VITALS — BP 121/67 | HR 77 | Temp 97.5°F | Resp 18 | Ht 67.0 in | Wt 169.9 lb

## 2020-09-30 DIAGNOSIS — Z5112 Encounter for antineoplastic immunotherapy: Secondary | ICD-10-CM | POA: Diagnosis present

## 2020-09-30 DIAGNOSIS — Z7189 Other specified counseling: Secondary | ICD-10-CM | POA: Diagnosis not present

## 2020-09-30 DIAGNOSIS — C50511 Malignant neoplasm of lower-outer quadrant of right female breast: Secondary | ICD-10-CM | POA: Insufficient documentation

## 2020-09-30 DIAGNOSIS — C7951 Secondary malignant neoplasm of bone: Secondary | ICD-10-CM

## 2020-09-30 DIAGNOSIS — Z5111 Encounter for antineoplastic chemotherapy: Secondary | ICD-10-CM | POA: Diagnosis present

## 2020-09-30 DIAGNOSIS — Z170 Estrogen receptor positive status [ER+]: Secondary | ICD-10-CM

## 2020-09-30 DIAGNOSIS — Z79899 Other long term (current) drug therapy: Secondary | ICD-10-CM | POA: Diagnosis not present

## 2020-09-30 DIAGNOSIS — C773 Secondary and unspecified malignant neoplasm of axilla and upper limb lymph nodes: Secondary | ICD-10-CM | POA: Insufficient documentation

## 2020-09-30 DIAGNOSIS — Z95828 Presence of other vascular implants and grafts: Secondary | ICD-10-CM

## 2020-09-30 LAB — COMPREHENSIVE METABOLIC PANEL
ALT: 68 U/L — ABNORMAL HIGH (ref 0–44)
AST: 31 U/L (ref 15–41)
Albumin: 3.9 g/dL (ref 3.5–5.0)
Alkaline Phosphatase: 108 U/L (ref 38–126)
Anion gap: 12 (ref 5–15)
BUN: 13 mg/dL (ref 6–20)
CO2: 21 mmol/L — ABNORMAL LOW (ref 22–32)
Calcium: 9.4 mg/dL (ref 8.9–10.3)
Chloride: 109 mmol/L (ref 98–111)
Creatinine, Ser: 0.78 mg/dL (ref 0.44–1.00)
GFR, Estimated: >60 mL/min (ref >60)
Glucose, Bld: 205 mg/dL — ABNORMAL HIGH (ref 70–99)
Potassium: 4.2 mmol/L (ref 3.5–5.1)
Sodium: 142 mmol/L (ref 135–145)
Total Bilirubin: 0.4 mg/dL (ref 0.3–1.2)
Total Protein: 6.8 g/dL (ref 6.5–8.1)

## 2020-09-30 LAB — CBC WITH DIFFERENTIAL/PLATELET
Abs Immature Granulocytes: 0.08 10*3/uL — ABNORMAL HIGH (ref 0.00–0.07)
Basophils Absolute: 0 10*3/uL (ref 0.0–0.1)
Basophils Relative: 0 %
Eosinophils Absolute: 0 10*3/uL (ref 0.0–0.5)
Eosinophils Relative: 0 %
HCT: 36.3 % (ref 36.0–46.0)
Hemoglobin: 12.1 g/dL (ref 12.0–15.0)
Immature Granulocytes: 1 %
Lymphocytes Relative: 6 %
Lymphs Abs: 0.5 10*3/uL — ABNORMAL LOW (ref 0.7–4.0)
MCH: 32.6 pg (ref 26.0–34.0)
MCHC: 33.3 g/dL (ref 30.0–36.0)
MCV: 97.8 fL (ref 80.0–100.0)
Monocytes Absolute: 0 10*3/uL — ABNORMAL LOW (ref 0.1–1.0)
Monocytes Relative: 0 %
Neutro Abs: 7.2 10*3/uL (ref 1.7–7.7)
Neutrophils Relative %: 93 %
Platelets: 241 10*3/uL (ref 150–400)
RBC: 3.71 MIL/uL — ABNORMAL LOW (ref 3.87–5.11)
RDW: 17.4 % — ABNORMAL HIGH (ref 11.5–15.5)
WBC: 7.8 10*3/uL (ref 4.0–10.5)
nRBC: 0 % (ref 0.0–0.2)

## 2020-09-30 MED ORDER — HEPARIN SOD (PORK) LOCK FLUSH 100 UNIT/ML IV SOLN
500.0000 [IU] | Freq: Once | INTRAVENOUS | Status: AC | PRN
  Administered 2020-09-30: 500 [IU]

## 2020-09-30 MED ORDER — DIPHENHYDRAMINE HCL 25 MG PO CAPS
25.0000 mg | ORAL_CAPSULE | Freq: Once | ORAL | Status: AC
  Administered 2020-09-30: 25 mg via ORAL
  Filled 2020-09-30: qty 1

## 2020-09-30 MED ORDER — SODIUM CHLORIDE 0.9 % IV SOLN
600.0000 mg | Freq: Once | INTRAVENOUS | Status: AC
  Administered 2020-09-30: 600 mg via INTRAVENOUS
  Filled 2020-09-30: qty 60

## 2020-09-30 MED ORDER — SODIUM CHLORIDE 0.9 % IV SOLN
420.0000 mg | Freq: Once | INTRAVENOUS | Status: AC
  Administered 2020-09-30: 420 mg via INTRAVENOUS
  Filled 2020-09-30: qty 14

## 2020-09-30 MED ORDER — SODIUM CHLORIDE 0.9 % IV SOLN: Freq: Once | INTRAVENOUS | Status: AC

## 2020-09-30 MED ORDER — SODIUM CHLORIDE 0.9 % IV SOLN
75.0000 mg/m2 | Freq: Once | INTRAVENOUS | Status: AC
  Administered 2020-09-30: 140 mg via INTRAVENOUS
  Filled 2020-09-30: qty 14

## 2020-09-30 MED ORDER — SODIUM CHLORIDE 0.9% FLUSH: 10.0000 mL | Freq: Once | INTRAVENOUS | Status: DC

## 2020-09-30 MED ORDER — ACETAMINOPHEN 325 MG PO TABS
650.0000 mg | ORAL_TABLET | Freq: Once | ORAL | Status: AC
  Administered 2020-09-30: 650 mg via ORAL
  Filled 2020-09-30: qty 2

## 2020-09-30 MED ORDER — PALONOSETRON HCL INJECTION 0.25 MG/5ML
0.2500 mg | Freq: Once | INTRAVENOUS | Status: AC
Start: 2020-09-30 — End: 2020-09-30
  Administered 2020-09-30: 0.25 mg via INTRAVENOUS
  Filled 2020-09-30: qty 5

## 2020-09-30 MED ORDER — TRASTUZUMAB-ANNS CHEMO 150 MG IV SOLR
6.0000 mg/kg | Freq: Once | INTRAVENOUS | Status: AC
  Administered 2020-09-30: 462 mg via INTRAVENOUS
  Filled 2020-09-30: qty 22

## 2020-09-30 MED ORDER — SODIUM CHLORIDE 0.9 % IV SOLN
10.0000 mg | Freq: Once | INTRAVENOUS | Status: AC
  Administered 2020-09-30: 10 mg via INTRAVENOUS
  Filled 2020-09-30: qty 10

## 2020-09-30 MED ORDER — SODIUM CHLORIDE 0.9 % IV SOLN
150.0000 mg | Freq: Once | INTRAVENOUS | Status: AC
  Administered 2020-09-30: 150 mg via INTRAVENOUS
  Filled 2020-09-30: qty 150

## 2020-09-30 MED ORDER — SODIUM CHLORIDE 0.9% FLUSH
10.0000 mL | INTRAVENOUS | Status: DC | PRN
  Administered 2020-09-30: 10 mL

## 2020-09-30 MED ORDER — SODIUM CHLORIDE 0.9% FLUSH: 10.0000 mL | INTRAVENOUS | Status: DC | PRN

## 2020-09-30 NOTE — Patient Instructions (Signed)
[facility] ONCOLOGY  Discharge Instructions: Thank you for choosing [facility] to provide your oncology and hematology care.   If you have a lab appointment with the [facility], please go directly to the [facility] and check in at the registration area.   Wear comfortable clothing and clothing appropriate for easy access to any Portacath or PICC line.   We strive to give you quality time with your provider. You may need to reschedule your appointment if you arrive late (15 or more minutes).  Arriving late affects you and other patients whose appointments are after yours.  Also, if you miss three or more appointments without notifying the office, you may be dismissed from the clinic at the provider's discretion.      For prescription refill requests, have your pharmacy contact our office and allow 72 hours for refills to be completed.    Today you received the following chemotherapy and/or immunotherapy agents Trastuzumab, Perjeta, Taxotere and carboplatin      To help prevent nausea and vomiting after your treatment, we encourage you to take your nausea medication as directed.  BELOW ARE SYMPTOMS THAT SHOULD BE REPORTED IMMEDIATELY: *FEVER GREATER THAN 100.4 F (38 C) OR HIGHER *CHILLS OR SWEATING *NAUSEA AND VOMITING THAT IS NOT CONTROLLED WITH YOUR NAUSEA MEDICATION *UNUSUAL SHORTNESS OF BREATH *UNUSUAL BRUISING OR BLEEDING *URINARY PROBLEMS (pain or burning when urinating, or frequent urination) *BOWEL PROBLEMS (unusual diarrhea, constipation, pain near the anus) TENDERNESS IN MOUTH AND THROAT WITH OR WITHOUT PRESENCE OF ULCERS (sore throat, sores in mouth, or a toothache) UNUSUAL RASH, SWELLING OR PAIN  UNUSUAL VAGINAL DISCHARGE OR ITCHING   Items with * indicate a potential emergency and should be followed up as soon as possible or go to the Emergency Department if any problems should occur.  Please show the CHEMOTHERAPY ALERT CARD or  IMMUNOTHERAPY ALERT CARD at check-in to the Emergency Department and triage nurse.  Should you have questions after your visit or need to cancel or reschedule your appointment, please contact [facility]  Dept: [phone]  and follow the prompts.  Office hours are 8:00 a.m. to 4:30 p.m. Monday - Friday. Please note that voicemails left after 4:00 p.m. may not be returned until the following business day.  We are closed weekends and major holidays. You have access to a nurse at all times for urgent questions. Please call the main number to the clinic Dept: [phone] and follow the prompts.   For any non-urgent questions, you may also contact your provider using MyChart. We now offer e-Visits for anyone 26 and older to request care online for non-urgent symptoms. For details visit [email or web address].   Also download the MyChart app! Go to the app store, search "MyChart", open the app, select [facility], and log in with your MyChart username and password.  Due to Covid, a mask is required upon entering the hospital/clinic. If you do not have a mask, one will be given to you upon arrival. For doctor visits, patients may have 1 support person aged 51 or older with them. For treatment visits, patients cannot have anyone with them due to current Covid guidelines and our immunocompromised population.

## 2020-10-02 ENCOUNTER — Ambulatory Visit: Payer: 59

## 2020-10-02 ENCOUNTER — Inpatient Hospital Stay: Payer: 59

## 2020-10-02 ENCOUNTER — Other Ambulatory Visit: Payer: Self-pay

## 2020-10-02 VITALS — BP 113/70 | HR 60 | Temp 98.6°F | Resp 18

## 2020-10-02 DIAGNOSIS — Z170 Estrogen receptor positive status [ER+]: Secondary | ICD-10-CM

## 2020-10-02 DIAGNOSIS — C50511 Malignant neoplasm of lower-outer quadrant of right female breast: Secondary | ICD-10-CM

## 2020-10-02 DIAGNOSIS — Z5112 Encounter for antineoplastic immunotherapy: Secondary | ICD-10-CM | POA: Diagnosis not present

## 2020-10-02 MED ORDER — PEGFILGRASTIM-BMEZ 6 MG/0.6ML ~~LOC~~ SOSY
6.0000 mg | PREFILLED_SYRINGE | Freq: Once | SUBCUTANEOUS | Status: AC
  Administered 2020-10-02: 6 mg via SUBCUTANEOUS
  Filled 2020-10-02: qty 0.6

## 2020-10-03 ENCOUNTER — Telehealth: Payer: Self-pay | Admitting: *Deleted

## 2020-10-03 ENCOUNTER — Inpatient Hospital Stay: Payer: 59

## 2020-10-03 ENCOUNTER — Telehealth: Payer: Self-pay

## 2020-10-03 ENCOUNTER — Other Ambulatory Visit: Payer: Self-pay | Admitting: *Deleted

## 2020-10-03 DIAGNOSIS — R319 Hematuria, unspecified: Secondary | ICD-10-CM

## 2020-10-03 DIAGNOSIS — N6092 Unspecified benign mammary dysplasia of left breast: Secondary | ICD-10-CM

## 2020-10-03 DIAGNOSIS — Z5112 Encounter for antineoplastic immunotherapy: Secondary | ICD-10-CM | POA: Diagnosis not present

## 2020-10-03 LAB — CBC WITH DIFFERENTIAL (CANCER CENTER ONLY)
Abs Immature Granulocytes: 3.23 10*3/uL — ABNORMAL HIGH (ref 0.00–0.07)
Basophils Absolute: 0.2 10*3/uL — ABNORMAL HIGH (ref 0.0–0.1)
Basophils Relative: 0 %
Eosinophils Absolute: 0 10*3/uL (ref 0.0–0.5)
Eosinophils Relative: 0 %
HCT: 35.4 % — ABNORMAL LOW (ref 36.0–46.0)
Hemoglobin: 11.6 g/dL — ABNORMAL LOW (ref 12.0–15.0)
Immature Granulocytes: 6 %
Lymphocytes Relative: 1 %
Lymphs Abs: 0.4 10*3/uL — ABNORMAL LOW (ref 0.7–4.0)
MCH: 33.2 pg (ref 26.0–34.0)
MCHC: 32.8 g/dL (ref 30.0–36.0)
MCV: 101.4 fL — ABNORMAL HIGH (ref 80.0–100.0)
Monocytes Absolute: 0.1 10*3/uL (ref 0.1–1.0)
Monocytes Relative: 0 %
Neutro Abs: 49.2 10*3/uL — ABNORMAL HIGH (ref 1.7–7.7)
Neutrophils Relative %: 93 %
Platelet Count: 165 10*3/uL (ref 150–400)
RBC: 3.49 MIL/uL — ABNORMAL LOW (ref 3.87–5.11)
RDW: 17.7 % — ABNORMAL HIGH (ref 11.5–15.5)
WBC Count: 53.2 10*3/uL (ref 4.0–10.5)
nRBC: 0 % (ref 0.0–0.2)

## 2020-10-03 LAB — URINALYSIS, COMPLETE (UACMP) WITH MICROSCOPIC
Bilirubin Urine: NEGATIVE
Glucose, UA: NEGATIVE mg/dL
Ketones, ur: NEGATIVE mg/dL
Nitrite: POSITIVE — AB
Protein, ur: NEGATIVE mg/dL
Specific Gravity, Urine: 1.012 (ref 1.005–1.030)
pH: 6 (ref 5.0–8.0)

## 2020-10-03 MED ORDER — NITROFURANTOIN MONOHYD MACRO 100 MG PO CAPS: 100.0000 mg | ORAL_CAPSULE | Freq: Two times a day (BID) | ORAL | 0 refills | Status: DC

## 2020-10-03 NOTE — Telephone Encounter (Signed)
Pt called to this RN stating onset PM yesterday of burning with urination and noted blood clots in urine.  She denies any fevers or chills. She took Urostat for discomfort.  Per lab today per phone request - noted abnormal U/A- reviewed with covering provider and obtained prescription for antibiotic  Reviewed with pt - which of note she had taken a picture of the blood clots pm last night which showed about 5 small ( less then 1/4 inch stringy looking ) " clots ". Urine color was amber.  She states this am there was less clots.  She denies any large or gelatinous clots.  She understands need to push fluids to assist with clearance of infection and aid benefit with the antibiotic.

## 2020-10-03 NOTE — Telephone Encounter (Signed)
CRITICAL VALUE STICKER  CRITICAL VALUE: WBC = 53.2  RECEIVER (on-site recipient of call): Yetta Glassman, CMA  DATE & TIME NOTIFIED: 10/03/20 at 10:25am  MESSENGER (representative from lab): Hillary  MD NOTIFIED: Thedore Mins, NP  TIME OF NOTIFICATION: 10/03/20 at 10:27am  RESPONSE: Notifiation given to [facility], RN for follow-up with provider and pt.

## 2020-10-05 LAB — URINE CULTURE: Culture: 60000 — AB

## 2020-10-07 ENCOUNTER — Other Ambulatory Visit (HOSPITAL_COMMUNITY): Payer: 59

## 2020-10-07 ENCOUNTER — Ambulatory Visit (HOSPITAL_COMMUNITY)
Admission: RE | Admit: 2020-10-07 | Discharge: 2020-10-07 | Disposition: A | Discharge status: Home / Self Care | Payer: 59 | Source: Ambulatory Visit | Attending: Oncology | Admitting: Oncology

## 2020-10-07 ENCOUNTER — Other Ambulatory Visit: Payer: Self-pay

## 2020-10-07 ENCOUNTER — Ambulatory Visit (HOSPITAL_BASED_OUTPATIENT_CLINIC_OR_DEPARTMENT_OTHER): Payer: 59

## 2020-10-07 DIAGNOSIS — Z170 Estrogen receptor positive status [ER+]: Secondary | ICD-10-CM | POA: Insufficient documentation

## 2020-10-07 DIAGNOSIS — C7951 Secondary malignant neoplasm of bone: Secondary | ICD-10-CM | POA: Insufficient documentation

## 2020-10-07 DIAGNOSIS — Z0189 Encounter for other specified special examinations: Secondary | ICD-10-CM | POA: Diagnosis not present

## 2020-10-07 DIAGNOSIS — C50511 Malignant neoplasm of lower-outer quadrant of right female breast: Secondary | ICD-10-CM

## 2020-10-07 DIAGNOSIS — Z7189 Other specified counseling: Secondary | ICD-10-CM | POA: Insufficient documentation

## 2020-10-07 LAB — ECHOCARDIOGRAM COMPLETE
Area-P 1/2: 2.93 cm2
S' Lateral: 3.2 cm

## 2020-10-07 IMAGING — MR MR TOTAL SPINE METS SCREENING
17 of 22 series · 36 of 48 positions shown · IV contrast (7.5 ML GADAVIST)
Comparison: Comparison made with prior PET-CT from [DATE],
abdominal MRI from [DATE], bone scan or CT from [DATE].

CLINICAL DATA: 54-year-old female with history of breast cancer,
known osseous metastatic disease. Initial evaluation prior to
radionuclide therapy.

EXAM:
MRI TOTAL SPINE WITHOUT AND WITH CONTRAST
TECHNIQUE: Multisequence MR imaging of the spine from the cervical spine to the
sacrum was performed prior to and following IV contrast
administration for evaluation of spinal metastatic disease.
CONTRAST:  7.5mL GADAVIST GADOBUTROL 1 MMOL/ML IV SOLN

[Series 16: T1 · sagittal · 4.0mm · 1.30mm/px · 2 of 11 slices shown (1 of 8)]
[im 1/11]
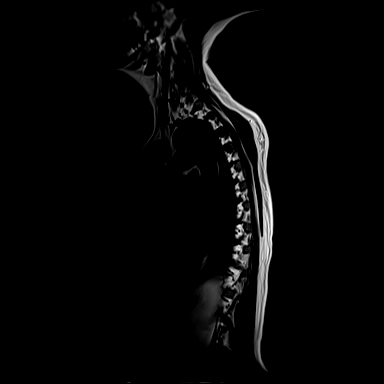
[im 11/11]
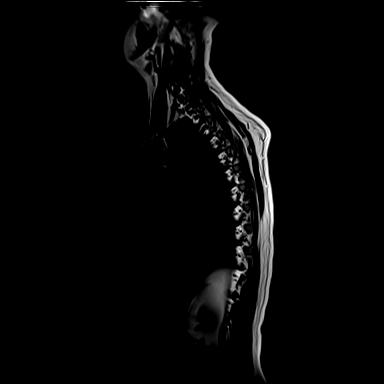

[Series 19: T1 · sagittal · 4.0mm · 1.00mm/px · 2 of 15 slices shown (2 of 8)]
[im 1/15]
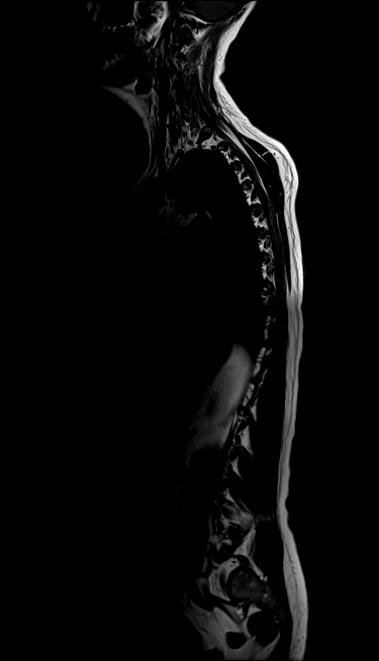
[im 15/15]
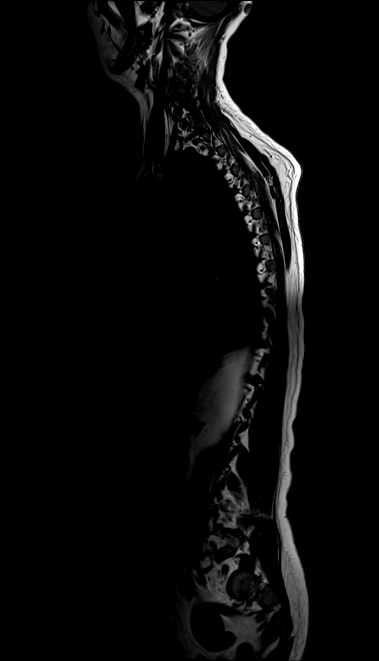

[Series 20: T1 · sagittal · 4.0mm · 1.00mm/px · 1 of 15 slices shown (3 of 8)]
[im 1/15]
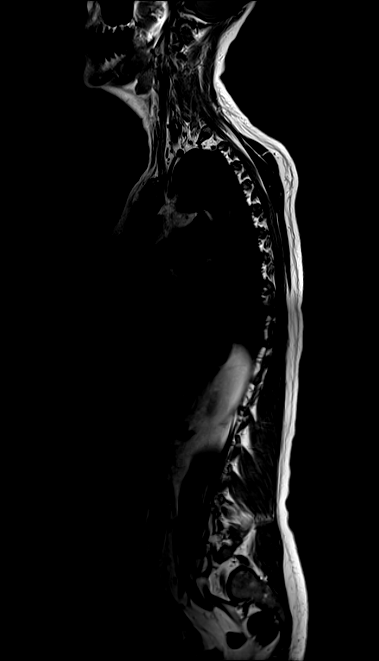

[Series 27: T1 · axial · 4.0mm · 0.62mm/px · z∈[-220,+50]mm · 3 of 58 slices shown (4 of 8)]
[im 1/58]
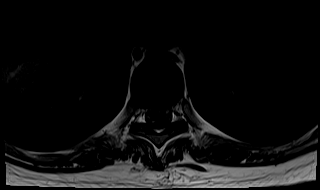
[im 29/58]
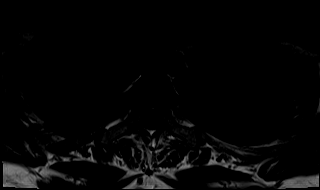
[im 58/58]
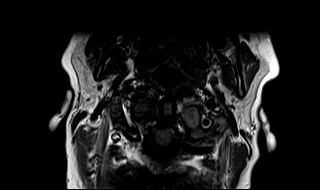

[Series 28: T1 · axial · 4.0mm · 0.62mm/px · z∈[-220,+50]mm · 3 of 58 slices shown (5 of 8)]
[im 1/58]
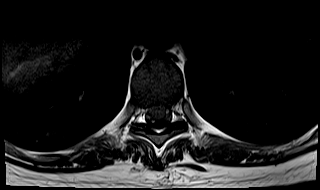
[im 29/58]
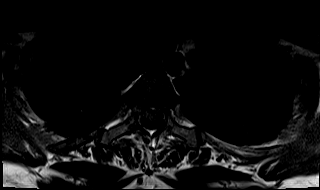
[im 58/58]
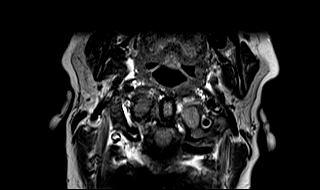

[Series 31: T1 · axial · 4.0mm · 0.62mm/px · z∈[-513,-234]mm · 3 of 55 slices shown (6 of 8)]
[im 1/55]
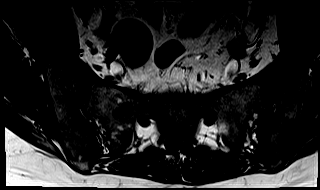
[im 28/55]
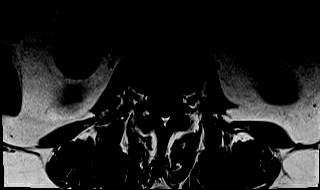
[im 55/55]
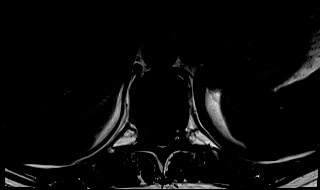

[Series 32: T1 · axial · 4.0mm · 0.62mm/px · z∈[-513,-234]mm · 3 of 55 slices shown (7 of 8)]
[im 1/55]
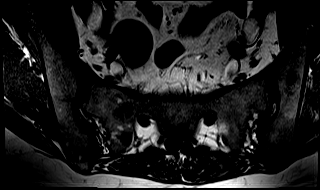
[im 28/55]
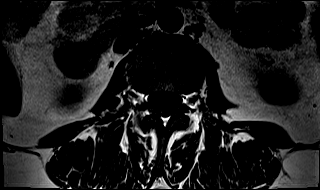
[im 55/55]
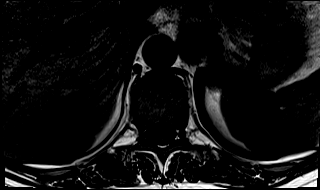

[Series 35: T2 post-contrast · sagittal · 4.0mm · 0.83mm/px · 1 of 15 slices shown (1 of 4)]
[im 1/15]
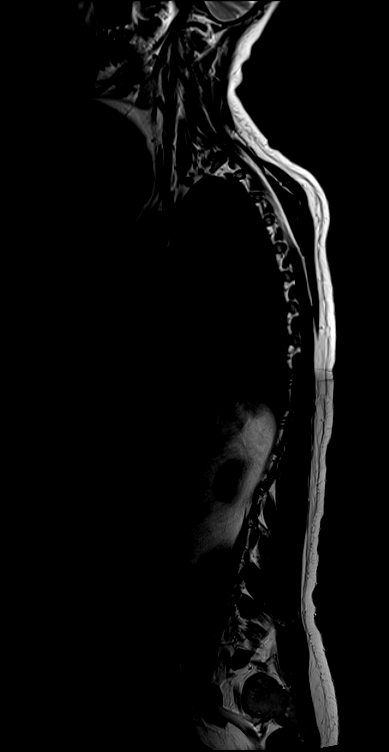

[Series 36: T2 post-contrast · sagittal · 4.0mm · 0.83mm/px · 1 of 15 slices shown (2 of 4)]
[im 1/15]
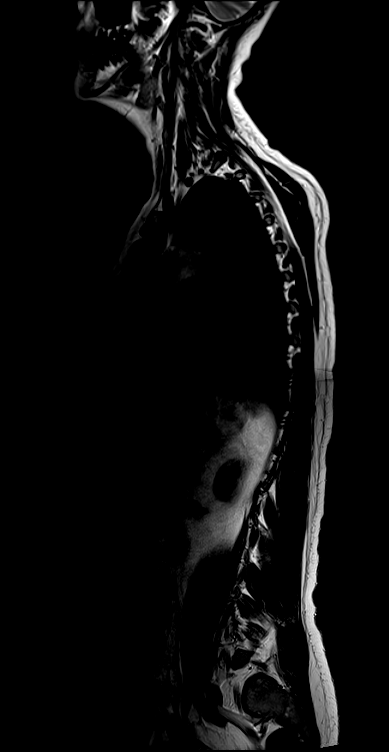

[Series 39: T1 fat-sat · sagittal · 4.0mm · 1.00mm/px · 1 of 15 slices shown (1 of 2)]
[im 1/15]
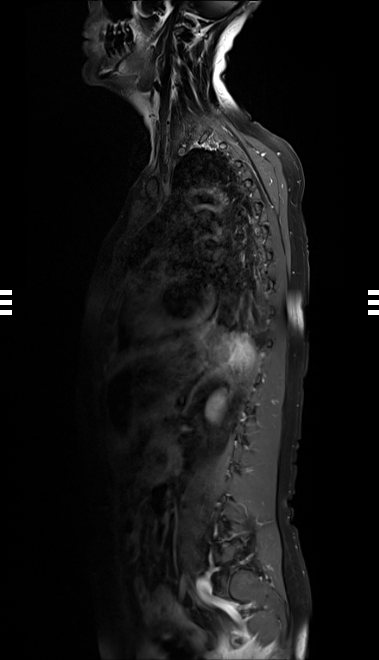

[Series 40: T1 fat-sat · sagittal · 4.0mm · 1.00mm/px · 1 of 15 slices shown (2 of 2)]
[im 1/15]
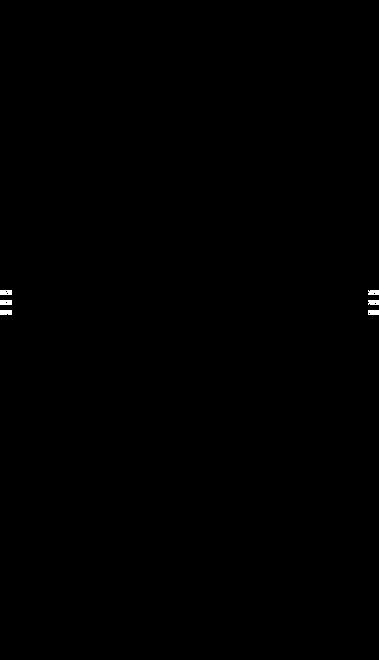

[Series 41: T1 fat-sat post-contrast · sagittal · 4.0mm · 1.25mm/px · 1 of 15 slices shown]
[im 1/15]
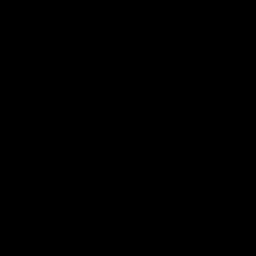

[Series 44: T2 post-contrast · axial · 4.0mm · 0.62mm/px · z∈[-221,+49]mm · 3 of 58 slices shown (3 of 4)]
[im 1/58]
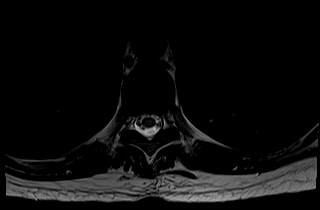
[im 29/58]
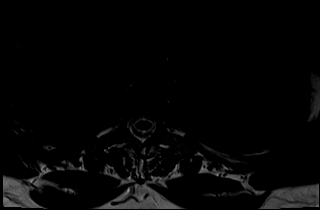
[im 58/58]
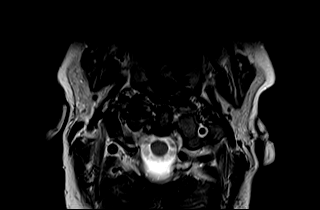

[Series 45: T2 post-contrast · axial · 4.0mm · 0.62mm/px · z∈[-221,+49]mm · 3 of 58 slices shown (4 of 4)]
[im 1/58]
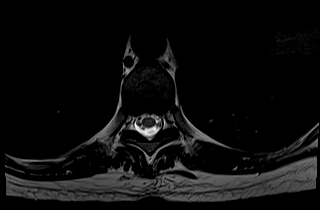
[im 29/58]
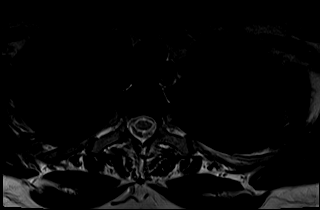
[im 58/58]
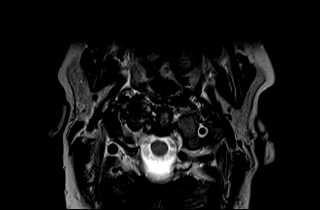

[Series 48: T2 · axial · 4.0mm · 0.62mm/px · z∈[-513,-234]mm · 3 of 55 slices shown (1 of 2)]
[im 1/55]
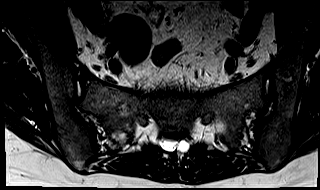
[im 28/55]
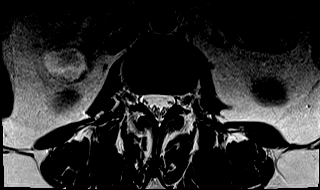
[im 55/55]
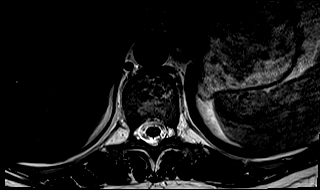

[Series 49: T2 · axial · 4.0mm · 0.62mm/px · z∈[-513,-234]mm · 3 of 55 slices shown (2 of 2)]
[im 1/55]
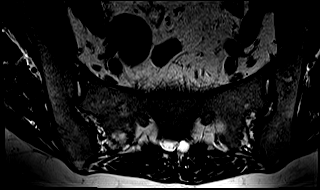
[im 28/55]
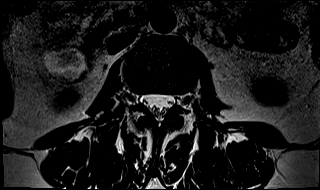
[im 55/55]
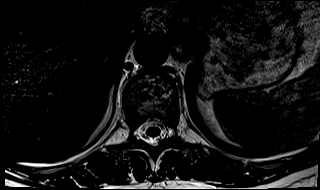

[Series 52: T1 · axial · 4.0mm · 0.62mm/px · z∈[-220,-85]mm · 2 of 58 slices shown (8 of 8)]
[im 1/58]
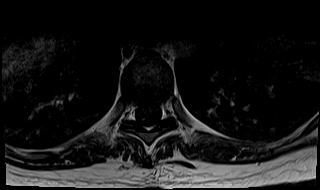
[im 29/58]
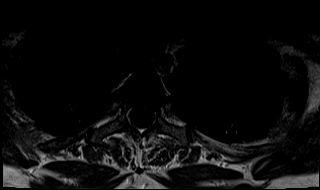

[36 of 48 positions shown; findings below may reference images not displayed]

FINDINGS: MRI CERVICAL SPINE FINDINGS

Alignment: Vertebral bodies normally aligned with preservation of
the normal cervical lordosis. No listhesis.

Vertebrae: Vertebral body height maintained without acute or chronic
fracture. Bone marrow signal intensity within normal limits. No
convincing osseous metastases seen within the cervical spine itself.
No abnormal marrow edema or enhancement.

Cord: Normal signal and morphology. No epidural or intracanalicular
tumor. No abnormal enhancement.

Posterior Fossa, vertebral arteries, paraspinal tissues: Visualized
brain and posterior fossa within normal limits. Craniocervical
junction within normal limits. Note made of a 1 cm nonenhancing
nodule within the subcutaneous fat of the right occipital scalp
(series 19, image 14), nonspecific, but likely benign. Normal flow
voids seen within the vertebral arteries bilaterally. Few
subcentimeter nodules noted within the right lobe of thyroid,
largest of which measures 5 mm, of doubtful significance given size
and patient age, no follow-up imaging recommended (ref: [HOSPITAL]. [DATE]): 143-50).Visualized paraspinous soft tissues
otherwise unremarkable.

Disc levels:

Mild for age noncompressive disc bulging present throughout the
cervical spine. No focal disc herniation or significant spinal
stenosis. Foramina remain widely patent.

MRI THORACIC SPINE FINDINGS

Alignment: Vertebral bodies normally aligned with preservation of
the normal thoracic kyphosis. No listhesis.

Vertebrae: Scattered focal marrow replacing lesions are seen
involving the thoracic spine, consistent with known osseous
metastatic disease. The most prominent of these lesions is seen
involving the anterior aspect of the T10 vertebral body, and
measures approximately 2 cm in size (series 40, image 9). Additional
lesions are fairly small measuring approximately 1 cm or less.
Specifically, small lesions are seen involving the T2 vertebral body
(series 44, image 31), as well as the T4 through T9 as well as the
T12 vertebral bodies (series 40, images 8, 7, 10, 11, 13). Overall,
these appear somewhat less pronounced as compared to previous
abdominal MRI from [DATE], and may be improved. No significant
involvement of the posterior elements. No extra osseous extension of
tumor. No associated pathologic fracture or other complication.

Cord: Signal intensity within the thoracic spinal cord is within
normal limits. No epidural tumor or involvement. No abnormal
enhancement.

Paraspinal and other soft tissues: Paraspinous soft tissues
demonstrate no acute or significant finding. Partially visualized
lungs are clear. 1 cm simple cyst noted within the interpolar right
kidney. Remainder of the visualized visceral structures otherwise
unremarkable.

Disc levels:

Or narrowing for age multilevel disc desiccation seen throughout the
midthoracic spine. Tiny right foraminal disc protrusion noted at the
T12-L1 level. No other focal disc herniation. No significant
stenosis or neural impingement.

MRI LUMBAR SPINE FINDINGS

Segmentation: Standard. Lowest well-formed disc space labeled the
L5-S1 level.

Alignment: 3 mm anterolisthesis of L4 on L5, chronic and facet
mediated. Alignment otherwise normal preservation of the normal
lumbar lordosis.

Vertebrae: Few scattered subcentimeter lesions seen involving the
lumbar spine as well as the visualized sacrum and pelvis, consistent
with known osseous metastatic disease. Most prominent of these
lesions seen involving the left superior aspect of L1 and measures 9
mm (series 40, image 6). The remainder of the lesions are fairly
subtle and subcentimeter in size, perhaps best appreciated on axial
T2 sequence (series 48, images 17, 20, 25, 36, 39, 55). Overall,
these lesions are slightly less prominent as compared to previous
MRI from [DATE]. No extra osseous extension of tumor. No
pathologic fracture or other complication.

Conus medullaris: Extends to the L1 level and appears normal. No
malignant epidural or intracanalicular involvement. No abnormal
enhancement. 3.2 cm Tarlov cyst noted posterior to the S2 segment

Paraspinal and other soft tissues: Visualized paraspinous soft
tissues within normal limits. Partially visualized visceral
structures unremarkable.

Disc levels:

L1-2: Negative interspace. Mild to moderate facet hypertrophy. No
stenosis or impingement.

L2-3: Mild disc bulge. Mild to moderate facet hypertrophy. No
significant stenosis.

L3-4: Small right subarticular disc protrusion indents the right
ventral thecal sac (series 48, image 38). Mild bilateral facet
hypertrophy. Resultant mild narrowing of the right lateral recess.
Central canal and foramina remain patent.

L4-5: Anterolisthesis. Mild disc bulge with moderate facet
hypertrophy. Resultant mild narrowing of the lateral recesses.
Central canal remains patent. Mild bilateral L4 foraminal stenosis.

L5-S1: Disc bulge with disc desiccation. Small central disc
protrusion indents the ventral thecal sac (series 40, image 50).
Mild right worse than left facet hypertrophy. No significant spinal
stenosis. Foramina remain patent.
IMPRESSION: 1. Scattered osseous metastatic disease involving the thoracolumbar
spine as detailed above, somewhat less pronounced as compared to
previous abdominal MRI from [DATE], and may be improved. Most
prominent of these lesions measures 2 cm at the T10 vertebral body.
No extra osseous extension of tumor, pathologic fracture, or other
complication. No epidural or intracanalicular involvement.
2. Mild multilevel degenerative spondylosis as detailed above
without significant spinal stenosis or neural impingement.

## 2020-10-07 MED ORDER — GADOBUTROL 1 MMOL/ML IV SOLN
7.5000 mL | Freq: Once | INTRAVENOUS | Status: AC | PRN
  Administered 2020-10-07: 7.5 mL via INTRAVENOUS

## 2020-10-08 ENCOUNTER — Other Ambulatory Visit (HOSPITAL_COMMUNITY): Payer: 59

## 2020-10-09 ENCOUNTER — Encounter: Payer: Self-pay | Admitting: Oncology

## 2020-10-14 ENCOUNTER — Other Ambulatory Visit: Payer: 59

## 2020-10-17 MED FILL — Dexamethasone Sodium Phosphate Inj 100 MG/10ML: INTRAMUSCULAR | Qty: 1 | Status: AC

## 2020-10-17 MED FILL — Fosaprepitant Dimeglumine For IV Infusion 150 MG (Base Eq): INTRAVENOUS | Qty: 5 | Status: AC

## 2020-10-19 ENCOUNTER — Ambulatory Visit
Admission: RE | Admit: 2020-10-19 | Discharge: 2020-10-19 | Disposition: A | Discharge status: Home / Self Care | Payer: 59 | Source: Ambulatory Visit | Attending: Oncology | Admitting: Oncology

## 2020-10-19 ENCOUNTER — Other Ambulatory Visit: Payer: Self-pay

## 2020-10-19 DIAGNOSIS — C50511 Malignant neoplasm of lower-outer quadrant of right female breast: Secondary | ICD-10-CM

## 2020-10-19 DIAGNOSIS — C7951 Secondary malignant neoplasm of bone: Secondary | ICD-10-CM

## 2020-10-19 DIAGNOSIS — Z7189 Other specified counseling: Secondary | ICD-10-CM

## 2020-10-19 IMAGING — MR MR BREAST BILAT WO/W CM
8 of 13 series · 30 of 48 positions shown · IV contrast (8 ml gadavist)
Comparison: Previous exams including breast MRI dated [DATE].

CLINICAL DATA: Biopsy-proven RIGHT breast cancer at the 7 o'clock
axis and biopsy-proven lymph node metastasis in the RIGHT axilla
(ultrasound-guided biopsies performed on [DATE]).
TECHNIQUE: Multiplanar, multisequence MR images of both breasts were obtained
prior to and following the intravenous administration of 8 ml of
Gadavist

[Series 2: t2_tirm_tra ipat (a-p) · axial · 3.0mm · 0.66mm/px · 1 of 55 slices shown]
[im 1/55]
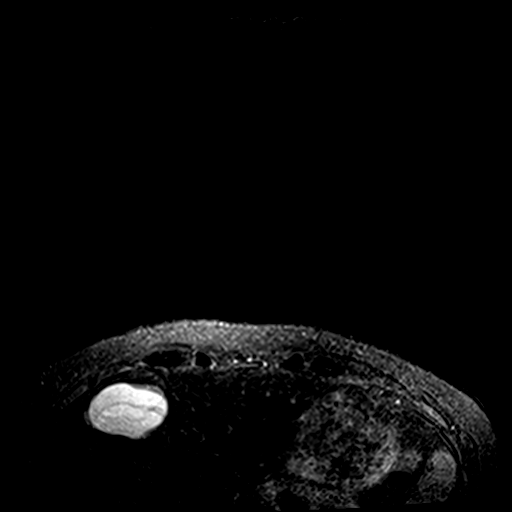

[Series 3: fl3d pre-cm no · axial · non-contrast · 1.2mm · 0.89mm/px · z∈[-91,+61]mm · 5 of 128 slices shown]
[im 1/128]
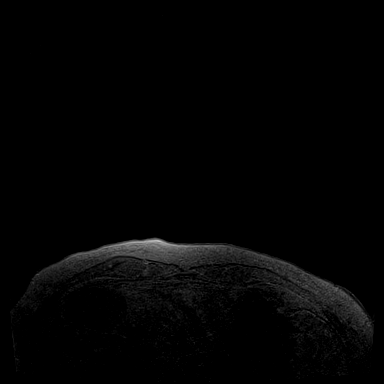
[im 32/128]
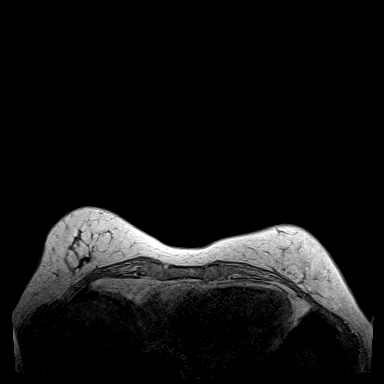
[im 64/128]
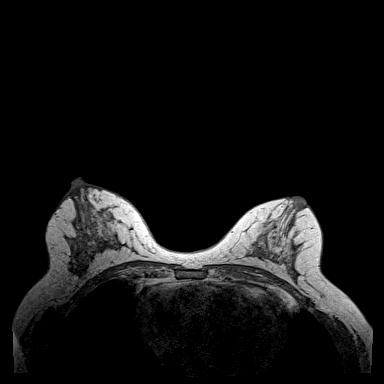
[im 96/128]
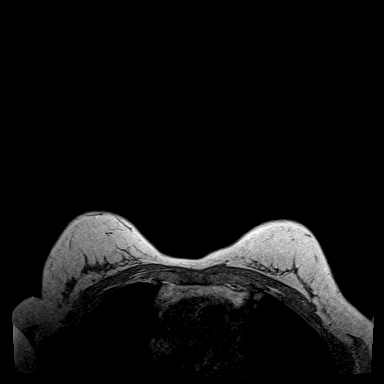
[im 128/128]
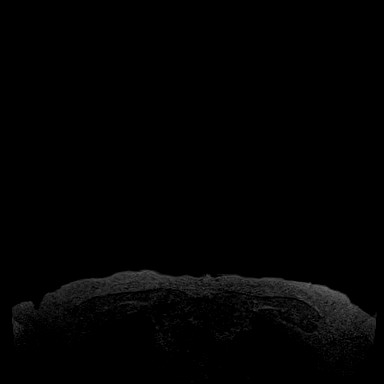

[Series 4: fl3d pre-cm · axial · non-contrast · 1.2mm · 0.89mm/px · z∈[-91,+61]mm · 5 of 128 slices shown]
[im 1/128]
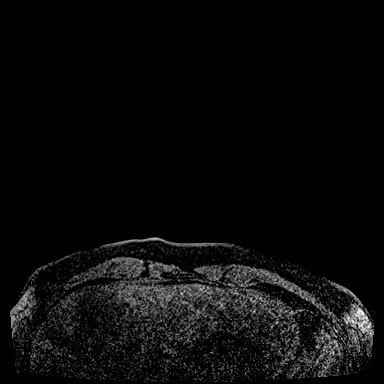
[im 32/128]
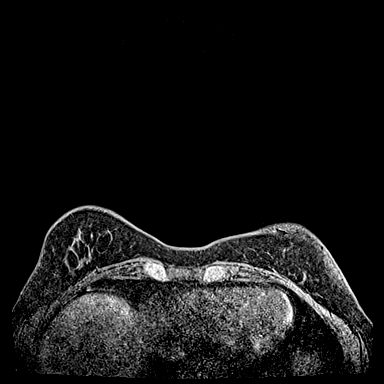
[im 64/128]
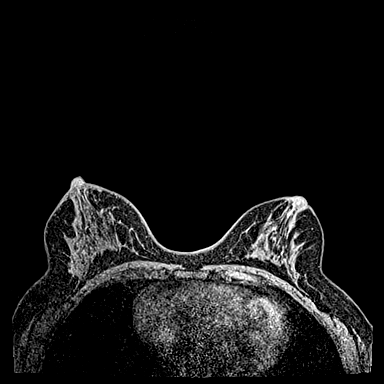
[im 96/128]
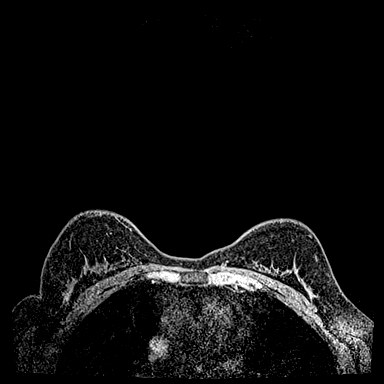
[im 128/128]
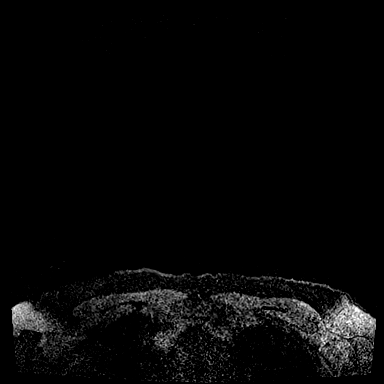

[Series 5: fl3d post-cm 20 · axial · 1.2mm · 0.89mm/px · z∈[-91,+61]mm · 5 of 128 slices shown (1 of 3)]
[im 1/128]
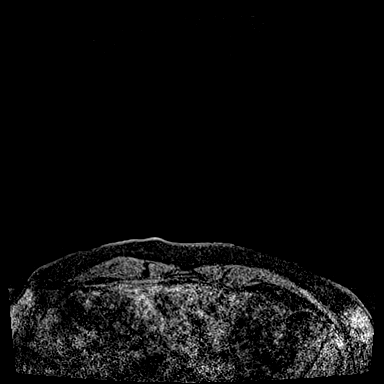
[im 32/128]
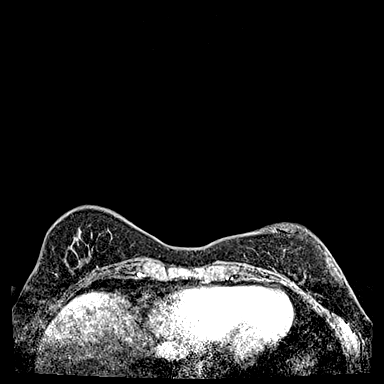
[im 64/128]
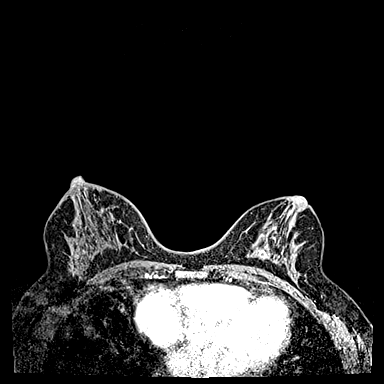
[im 96/128]
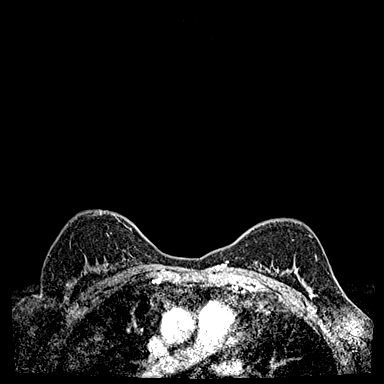
[im 128/128]
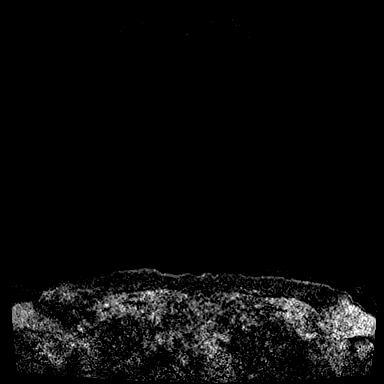

[Series 6: fl3d post-cm 20 · axial · 1.2mm · 0.89mm/px · z∈[-91,+61]mm · 5 of 128 slices shown (2 of 3)]
[im 1/128]
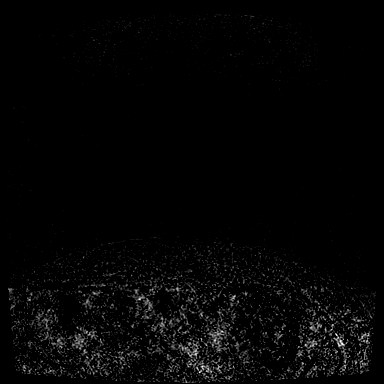
[im 32/128]
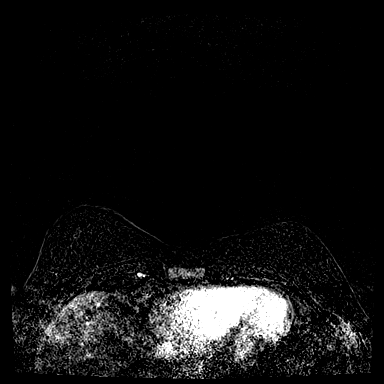
[im 64/128]
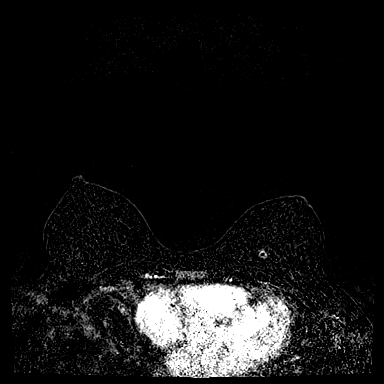
[im 96/128]
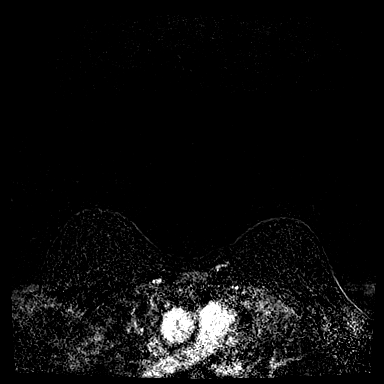
[im 128/128]
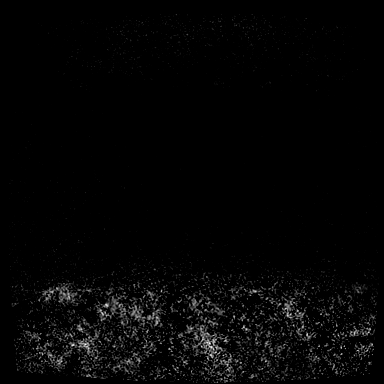

[Series 7: fl3d post-cm 20 · axial · 153.6mm · 0.89mm/px · 1 of 1 slices shown (3 of 3)]
[im 1/1]
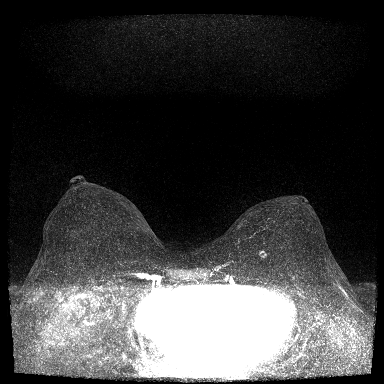

[Series 8: fl3d post-cm 3 · axial · 1.2mm · 0.89mm/px · z∈[-91,+61]mm · 5 of 128 slices shown (1 of 2)]
[im 1/128]
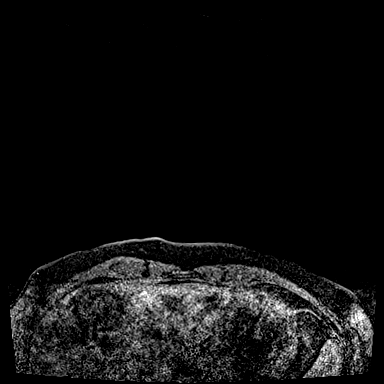
[im 32/128]
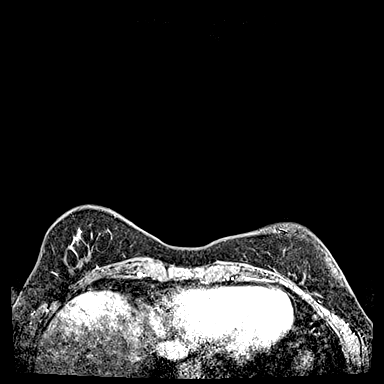
[im 64/128]
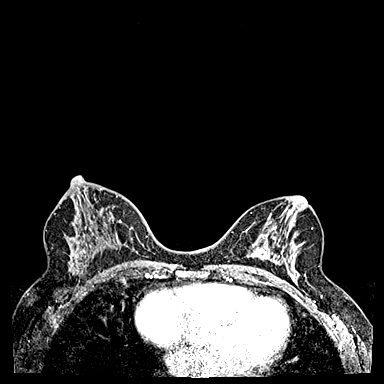
[im 96/128]
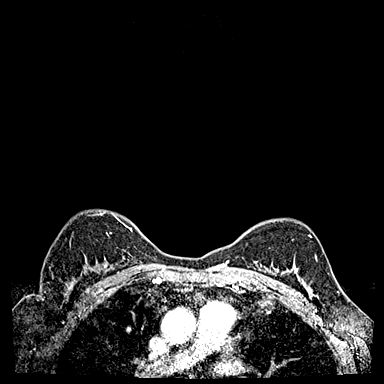
[im 128/128]
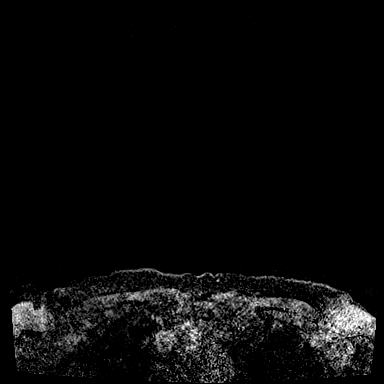

[Series 9: fl3d post-cm 3 · axial · 1.2mm · 0.89mm/px · z∈[-91,-31]mm · 3 of 128 slices shown (2 of 2)]
[im 1/128]
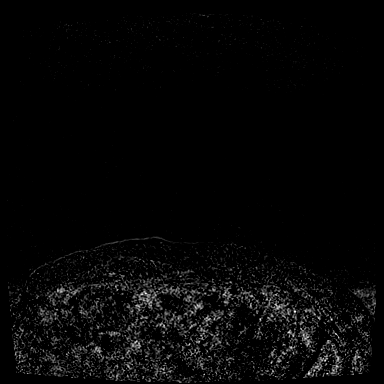
[im 26/128]
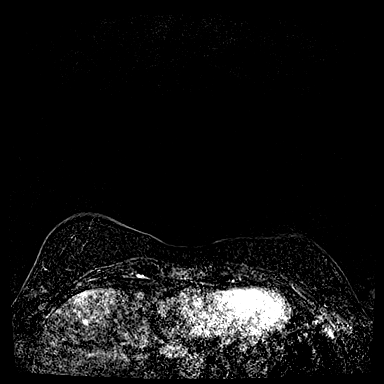
[im 51/128]
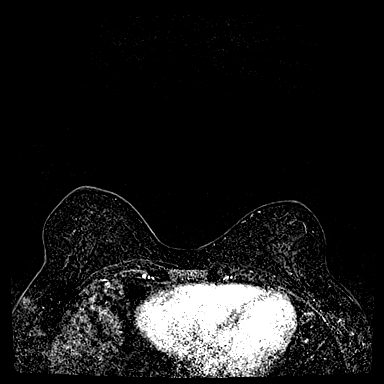

[30 of 48 positions shown; findings below may reference images not displayed]

Additional biopsy-proven RIGHT breast cancer at the 9 o'clock axis
(biopsy performed on [DATE]).

PET-CT performed on [DATE] describing hypermetabolic lesions in
the RIGHT breast and RIGHT axilla and osseous metastases. This was
confirmed on MRI of the spine dated [DATE] and described as
questionably improved compared to abdominal MRI of [DATE].

Currently on chemotherapy.  Assess treatment response.

LABS:  Not performed at imaging site.

EXAM:
BILATERAL BREAST MRI WITH AND WITHOUT CONTRAST
Three-dimensional MR images were rendered by post-processing of the
original MR data on an independent workstation. The
three-dimensional MR images were interpreted, and findings are
reported in the following complete MRI report for this study. Three
dimensional images were evaluated at the independent interpreting
workstation using the DynaCAD thin client.
FINDINGS: Breast composition: c. Heterogeneous fibroglandular tissue.

Background parenchymal enhancement: Minimal

Right breast: Previously demonstrated masses within the outer RIGHT
breast (7 o'clock and 9 o'clock axes) are both resolved, consistent
with a complete imaging response to interval treatment. Biopsy clip
artifact remains at the 7 and 9 o'clock axes.

There are now no enhancing masses, non-mass enhancement or secondary
signs of malignancy in the RIGHT breast.

Left breast: New ring-enhancing mass within the posterior LEFT
breast, inner retroareolar, measuring 1 cm, with persistent
enhancement kinetics (series 9, image 65).

Additional smaller irregular enhancing mass within the posterior
LEFT breast, slightly outer, measuring 5 mm, with persistent
enhancement kinetics (series 9, image 63).

Lymph nodes: No abnormal appearing lymph nodes, however, the upper
RIGHT axilla is incompletely imaged on today's study.

Ancillary findings: RIGHT hepatic lobe mass, better described on
abdominal MRI of [DATE].
IMPRESSION: 1. New ring-enhancing mass within the posterior LEFT breast, inner
retroareolar region, measuring 1 cm (series 9, image 65). This may
represent a benign breast cyst with pericystic inflammation, but new
contralateral disease cannot be excluded. MRI-guided biopsy is
recommended.
2. Additional irregular enhancing mass within the posterior LEFT
breast, slightly outer, measuring 5 mm (series 9, image 63).
Additional contralateral disease not excluded. MRI-guided biopsy is
recommended.
3. Previously demonstrated enhancing masses in the RIGHT breast are
no longer visualized, consistent with a complete imaging response to
interval therapy. Biopsy clips are seen at the sites of the
biopsy-proven invasive carcinomas in the RIGHT breast at the 7
o'clock and 9 o'clock axes.
4. No abnormal appearing lymph nodes, however, the upper RIGHT
axilla is incompletely imaged on today's study. Consider RIGHT
axillary ultrasound to evaluate axillary lymph node response to
interval treatment.

RECOMMENDATION:
1. MRI-guided biopsy of the new ring enhancing mass within the
posterior LEFT breast, inner retroareolar region, measuring 1 cm.
2. MRI-guided biopsy of the additional irregular enhancing mass
within the posterior LEFT breast, slightly outer, measuring 5 mm.
3. Consider RIGHT axillary ultrasound to ensure axillary lymph node
response to interval treatment (RIGHT axilla incompletely imaged on
today's MRI).

BI-RADS CATEGORY  4: Suspicious.

## 2020-10-19 MED ORDER — GADOBUTROL 1 MMOL/ML IV SOLN
8.0000 mL | Freq: Once | INTRAVENOUS | Status: AC | PRN
  Administered 2020-10-19: 8 mL via INTRAVENOUS

## 2020-10-20 NOTE — Progress Notes (Signed)
[place]  Telephone:([phone]) [phone] Fax:(336) [phone]    ID: Kim Archer DOB: Oct 30, 1965  MR#: [id number]  NGE#:[id number]  Patient Care Team: Kim Queen, MD as PCP - General (Obstetrics and Gynecology) Kim Archer, Kim Dad, MD as Consulting Physician (Oncology) Kim Kussmaul, MD as Consulting Physician (General Surgery) Kim Queen, MD as Consulting Physician (Obstetrics and Gynecology) Kim Kaufmann, RN as Oncology Nurse Navigator Kim Germany, RN as Oncology Nurse Navigator Kim Kussmaul, MD as Consulting Physician (General Surgery) Kim Archer, Kim Dad, MD as Consulting Physician (Oncology) Kim Pray, MD as Consulting Physician (Radiation Oncology) Kim Archer, RPH-CPP (Pharmacist) Kim Cruel, MD OTHER MD:  CHIEF COMPLAINT: Triple positive breast cancer  CURRENT TREATMENT: anti-HER2 immunotherapy; denosumab/Xgeva   INTERVAL HISTORY: Kim Archer returns today for follow up and treatment of her triple positive breast cancer.  She is accompanied by her husband Kim Archer.  Since her last visit, she underwent total spine screening on 10/07/2020 showing: scattered osseous metastatic disease involving thoracolumbar spine, somewhat less pronounced compared to previous abdominal MRI 07/26/2020 and may be improved; no extra osseous extension of tumor, pathologic fracture, or other complication; no epidural or intracanalicular involvement.  She also underwent breast MRI on 10/19/2020. Results show a complete imaging response in the breast itself.  The right axilla however is not well imaged and there are 2 lesions in the left breast which are going to have to be biopsied.  She began neoadjuvant chemotherapy, consisting of Docetaxel, Carboplatin, trastuzumab and Pertuzumab given on day 1 of a 21 day cycle with Neulasta or biosimilar given on day 3, on 07/28/2020. Today would be cycle 5 but we are stopping her chemotherapy and continuing the immunotherapy  only.  She underwent repeat echocardiogram on 10/07/2020 showing and ejection fraction of 60-65%. This is slightly improved from prior.  We began Zometa with cycle 2 on 08/19/2020.  This is to be repeated every 12 weeks.   REVIEW OF SYSTEMS: Kim Archer gets constipated from her chemo and then gets diarrhea from her Perjeta.  She feels generally poorly 1 week better the second week and then back to normal the third week she says.  For the holiday they did a lot of housecleaning but they could not do yard work because of the rain.  She had a little bit of swelling in her ankles left more than right but that has resolved.  She does use the Imodium successfully for the diarrhea.  She is interested in a variety of supplements and those were discussed today.  Detailed review of systems was otherwise stable  COVID 19 VACCINATION STATUS: not vaccinated; infection 10/2019   HISTORY OF CURRENT ILLNESS: From the original intake note:  Kim Archer "Kim Archer" [facility]) was previously seen in the high risk breast cancer clinic on 05/11/2016. She had undergone left lumpectomy on 03/22/2016 showing atypical lobular hyperplasia. She was prescribed tamoxifen at that time, which she believes she took for two years. [She underwent hysterectomy with bilateral salpingo-oophorectomy 03/28/2019, pathology showing leiomyomas and adenomyosis, and may have stopped the tamoxifen around that time].  More recently, she had routine screening mammography on 06/11/2020 showing an area of asymmetry in the right breast. She underwent right diagnostic mammography with tomography and right breast ultrasonography at [facility] on 06/20/2020 showing: breast density category C; 2.4 cm right breast mass at 7 o'clock; one abnormal right axillary lymph node measuring 1.5 cm.  Accordingly on 06/25/2020 she proceeded to biopsy of the right breast area in  question. The pathology from this procedure ([id number]) showed: invasive mammary  carcinoma, e-cadherin positive, grade 2 or 3. Prognostic indicators significant for: estrogen receptor, 40% positive and progesterone receptor, >95% positive, both with strong staining intensity. Proliferation marker Ki67 at 40%. HER2 positive by immunohistochemistry (3+).  Biopsy of the right axillary lymph node performed at the same time showed mammary carcinoma, with no residual lymph node tissue present.  Cancer Staging Malignant neoplasm of lower-outer quadrant of right breast of female, estrogen receptor positive ([place]) Staging form: Breast, AJCC 8th Edition - Clinical stage from 07/02/2020: Stage IV (cT2, cN1, cM1, G3, ER+, PR+, HER2+) - Signed by Kim Cruel, MD on 08/04/2020 Stage prefix: Initial diagnosis Histologic grading system: 3 grade system Laterality: Right Staged by: Pathologist and managing physician Stage used in treatment planning: Yes National guidelines used in treatment planning: Yes Type of national guideline used in treatment planning: NCCN  The patient's subsequent history is as detailed below.   PAST MEDICAL HISTORY: Past Medical History:  Diagnosis Date   Allergy    Anemia    due to heavy periods   Atypical ductal hyperplasia of left breast    History of kidney stones    HSV-2 (herpes simplex virus 2) infection    PONV (postoperative nausea and vomiting)    right breast ca 06/2020   Breast cancer    PAST SURGICAL HISTORY: Past Surgical History:  Procedure Laterality Date   BREAST CYST EXCISION Left 03/22/2016   ALH-BENIGN   BREAST LUMPECTOMY WITH RADIOACTIVE SEED LOCALIZATION Left 03/22/2016   Procedure: LEFT BREAST LUMPECTOMY WITH RADIOACTIVE SEED LOCALIZATION;  Surgeon: Autumn Messing III, MD;  Location: [facility];  Service: General;  Laterality: Left;   CESAREAN SECTION     x3   COMBINED HYSTEROSCOPY DIAGNOSTIC / D&C  07/17/2018   EYE SURGERY     lt lens replacement   PORTACATH PLACEMENT Left 07/07/2020   Procedure: INSERTION  PORT-A-CATH;  Surgeon: Kim Kussmaul, MD;  Location: [facility];  Service: General;  Laterality: Left;   TUBAL LIGATION      FAMILY HISTORY: Family History  Problem Relation Age of Onset   Breast cancer Mother    Melanoma Father    Leukemia Sister    Liver cancer Brother        stomach   Breast cancer Maternal Aunt    Colon cancer Neg Hx    Colon polyps Neg Hx    Esophageal cancer Neg Hx    Rectal cancer Neg Hx    Stomach cancer Neg Hx   The patient's father died at age 75. He had a history of melanoma. He has a brother, the patient's uncle, with prostate cancer. The patient's mother died in her 54s from urosepsis. She had a history of breast cancer diagnosed in her 51s. The patient has a brother, with cerebral palsy, who had "stomach cancer" metastatic to the liver. The patient has 2 sisters. One of them died from childhood leukemia.    GYNECOLOGIC HISTORY:  No LMP recorded (lmp unknown). Patient has had a hysterectomy. Menarche: 55 years old Age at first live birth: 55 years old [place] P 3 LMP 03/2019, with hysterectomy Contraceptive: used remotely for many years with no complications HRT never used  Hysterectomy? Yes, 03/2019 (path in Epic) BSO? 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 Her husband [name] (goes by "Kim Archer") is Airline pilot for [employer], which manages apartment complexes. The patient's daughter Kearney Hard, age 23, works as a Theme park manager in [place]. She has 3 children. She, her husband, and their three children are living with [place] and [place]. The patient's daughter Moody Bruins, age 14, lives in [place]. Kim Archer has legal custody of one of Elina's children. The patient's third child, Quillian Quince "Dalbert Mayotte, 56 years old, also lives in [place] and works in Architect. Patient has in total 8 grandchildren. She is a Psychologist, forensic.    ADVANCED DIRECTIVES: In the  absence of any documentation to the contrary, the patient's spouse is their HCPOA.    HEALTH MAINTENANCE: Social History   Tobacco Use   Smoking status: Never   Smokeless tobacco: Never  Vaping Use   Vaping Use: Never used  Substance Use Topics   Alcohol use: No   Drug use: No     Colonoscopy: 08/2018 (Dr. Ardis Hughs), recall 2030  PAP: 05/2020  Bone density: 05/2019 (Dr. Christen Butter office)   Allergies  Allergen Reactions   Ciprofloxacin Other (See Comments)    Patient came into office with complaints of difficulty swallowing.  Ate at [employer] prior and took Cipro prior   Sulfa Antibiotics Rash    Current Outpatient Medications  Medication Sig Dispense Refill   LORazepam (ATIVAN) 0.5 MG tablet Take 1 tablet (0.5 mg total) by mouth every 8 (eight) hours as needed for anxiety (May increase to 2 tablets if needed). (Patient not taking: Reported on 07/24/2020) 30 tablet 1   Boron 3 MG CAPS Take by mouth.     Calcium 500 MG tablet 1,000 mg.     dexamethasone (DECADRON) 4 MG tablet Take 2 tablets (8 mg total) by mouth 2 (two) times daily. Start the day before Taxotere. Then take daily x 3 days after chemotherapy. 30 tablet 1   dexamethasone (DECADRON) 4 MG tablet Take 2 tablets (8 mg total) by mouth 2 (two) times daily. Start the day before Taxotere. Then take daily x 3 days after chemotherapy. 30 tablet 1   Diethylpropion HCl CR 75 MG TB24 diethylpropion ER 75 mg tablet,extended release  TAKE 1 TABLET BY MOUTH EVERY DAY     doxycycline (VIBRA-TABS) 100 MG tablet Take 1 tablet (100 mg total) by mouth 2 (two) times daily. 10 tablet 0   hydrocortisone (ANUSOL-HC) 25 MG suppository Place 1 suppository (25 mg total) rectally daily as needed for hemorrhoids or anal itching. 12 suppository 0   levocetirizine (XYZAL) 5 MG tablet Take 5 mg by mouth every evening.     lidocaine-prilocaine (EMLA) cream Apply to affected area once 30 g 3   loratadine (CLARITIN) 10 MG tablet Take 10 mg by mouth daily.      LUTEIN PO Take by mouth.     MAGNESIUM PO Take 1 tablet by mouth at bedtime.     Menatetrenone (VITAMIN K2) 100 MCG TABS Take by mouth.     Multiple Vitamin (MULTIVITAMIN WITH MINERALS) TABS tablet Take 1 tablet by mouth daily.     nitrofurantoin, macrocrystal-monohydrate, (MACROBID) 100 MG capsule Take 1 capsule (100 mg total) by mouth 2 (two) times daily. 14 capsule 0   prochlorperazine (COMPAZINE) 10 MG tablet Take 1 tablet (10 mg total) by mouth every 6 (six) hours as needed (Nausea or vomiting). 30 tablet 1   prochlorperazine (COMPAZINE) 10 MG tablet Take 1 tablet (10 mg total) by mouth every 6 (six) hours as needed (Nausea or vomiting). 30 tablet 1   tobramycin-dexamethasone (TOBRADEX) ophthalmic solution Place 1  drop into both eyes in the morning and at bedtime. 5 mL 0   Ubiquinol 50 MG CAPS Take by mouth daily.     valACYclovir (VALTREX) 500 MG tablet  (Patient not taking: Reported on 07/24/2020)  12   VITAMIN D PO Vitamin D     zinc sulfate 220 (50 Zn) MG capsule Take 220 mg by mouth daily.     No current facility-administered medications for this visit.    OBJECTIVE: White Archer in no acute distress  Vitals:   10/21/20 0847  BP: 133/81  Pulse: 74  Resp: 18  Temp: 98.1 F (36.7 C)  SpO2: 99%      Body mass index is 26.89 kg/m.   Wt Readings from Last 3 Encounters:  10/21/20 171 lb 11.2 oz (77.9 kg)  09/30/20 169 lb 14.4 oz (77.1 kg)  09/09/20 168 lb 14.4 oz (76.6 kg)     ECOG FS:1 - Symptomatic but completely ambulatory  Sclerae unicteric, EOMs intact Wearing a mask No cervical or supraclavicular adenopathy Lungs no rales or rhonchi Heart regular rate and rhythm Abd soft, nontender, positive bowel sounds MSK no focal spinal tenderness, no upper extremity lymphedema Neuro: nonfocal, well oriented, appropriate affect Breasts: I do not palpate a mass in the right breast.  There is no skin or nipple change of concern.  The left breast and both axillae are  benign   LAB RESULTS:  CMP     Component Value Date/Time   NA 143 10/21/2020 0810   NA 142 05/11/2016 1508   K 3.9 10/21/2020 0810   K 4.3 05/11/2016 1508   CL 110 10/21/2020 0810   CO2 23 10/21/2020 0810   CO2 26 05/11/2016 1508   GLUCOSE 96 10/21/2020 0810   GLUCOSE 90 05/11/2016 1508   BUN 16 10/21/2020 0810   BUN 10.9 05/11/2016 1508   CREATININE 0.73 10/21/2020 0810   CREATININE 0.77 08/04/2020 1540   CREATININE 1.0 05/11/2016 1508   CALCIUM 9.6 10/21/2020 0810   CALCIUM 9.5 05/11/2016 1508   PROT 6.5 10/21/2020 0810   PROT 7.0 05/11/2016 1508   ALBUMIN 3.9 10/21/2020 0810   ALBUMIN 3.9 05/11/2016 1508   AST 21 10/21/2020 0810   AST 29 08/04/2020 1540   AST 19 05/11/2016 1508   ALT 40 10/21/2020 0810   ALT 28 08/04/2020 1540   ALT 23 05/11/2016 1508   ALKPHOS 87 10/21/2020 0810   ALKPHOS 98 05/11/2016 1508   BILITOT 0.4 10/21/2020 0810   BILITOT 0.3 08/04/2020 1540   BILITOT 0.36 05/11/2016 1508   GFRNONAA >60 10/21/2020 0810   GFRNONAA >60 08/04/2020 1540    No results found for: TOTALPROTELP, ALBUMINELP, A1GS, A2GS, BETS, BETA2SER, GAMS, MSPIKE, SPEI  Lab Results  Component Value Date   WBC 10.8 (H) 10/21/2020   NEUTROABS 7.9 (H) 10/21/2020   HGB 11.1 (L) 10/21/2020   HCT 32.7 (L) 10/21/2020   MCV 100.0 10/21/2020   PLT 232 10/21/2020    No results found for: LABCA2  No components found for: [id number]  No results for input(s): INR in the last 168 hours.  No results found for: LABCA2  No results found for: CBU384  No results found for: TXM468  No results found for: EHO122  Lab Results  Component Value Date   CA2729 37.8 08/21/2020    No components found for: HGQUANT  No results found for: CEA1 / No results found for: CEA1   No results found for: AFPTUMOR  No results found for:  CHROMOGRNA  No results found for: KPAFRELGTCHN, LAMBDASER, KAPLAMBRATIO (kappa/lambda light chains)  No results found for: HGBA, HGBA2QUANT, HGBFQUANT,  HGBSQUAN (Hemoglobinopathy evaluation)   No results found for: LDH  No results found for: IRON, TIBC, IRONPCTSAT (Iron and TIBC)  No results found for: FERRITIN  Urinalysis    Component Value Date/Time   COLORURINE AMBER (A) 10/03/2020 1003   APPEARANCEUR CLEAR 10/03/2020 1003   LABSPEC 1.012 10/03/2020 1003   PHURINE 6.0 10/03/2020 1003   GLUCOSEU NEGATIVE 10/03/2020 1003   HGBUR LARGE (A) 10/03/2020 1003   BILIRUBINUR NEGATIVE 10/03/2020 1003   KETONESUR NEGATIVE 10/03/2020 1003   PROTEINUR NEGATIVE 10/03/2020 1003   NITRITE POSITIVE (A) 10/03/2020 1003   LEUKOCYTESUR LARGE (A) 10/03/2020 1003    STUDIES: MR BREAST BILATERAL W WO CONTRAST INC CAD  Result Date: 10/21/2020 CLINICAL DATA:  Biopsy-proven RIGHT breast cancer at the 7 o'clock axis and biopsy-proven lymph node metastasis in the RIGHT axilla (ultrasound-guided biopsies performed on 06/25/2020). Additional biopsy-proven RIGHT breast cancer at the 9 o'clock axis (biopsy performed on 07/11/2020). PET-CT performed on 08/04/2020 describing hypermetabolic lesions in the RIGHT breast and RIGHT axilla and osseous metastases. This was confirmed on MRI of the spine dated 10/07/2020 and described as questionably improved compared to abdominal MRI of 07/26/2020. Currently on chemotherapy.  Assess treatment response. LABS:  Not performed at imaging site. EXAM: BILATERAL BREAST MRI WITH AND WITHOUT CONTRAST TECHNIQUE: Multiplanar, multisequence MR images of both breasts were obtained prior to and following the intravenous administration of 8 ml of Gadavist Three-dimensional MR images were rendered by post-processing of the original MR data on an independent workstation. The three-dimensional MR images were interpreted, and findings are reported in the following complete MRI report for this study. Three dimensional images were evaluated at the independent interpreting workstation using the DynaCAD thin client. COMPARISON:  Previous exams  including breast MRI dated 07/07/2020. FINDINGS: Breast composition: c. Heterogeneous fibroglandular tissue. Background parenchymal enhancement: Minimal Right breast: Previously demonstrated masses within the outer RIGHT breast (7 o'clock and 9 o'clock axes) are both resolved, consistent with a complete imaging response to interval treatment. Biopsy clip artifact remains at the 7 and 9 o'clock axes. There are now no enhancing masses, non-mass enhancement or secondary signs of malignancy in the RIGHT breast. Left breast: New ring-enhancing mass within the posterior LEFT breast, inner retroareolar, measuring 1 cm, with persistent enhancement kinetics (series 9, image 65). Additional smaller irregular enhancing mass within the posterior LEFT breast, slightly outer, measuring 5 mm, with persistent enhancement kinetics (series 9, image 63). Lymph nodes: No abnormal appearing lymph nodes, however, the upper RIGHT axilla is incompletely imaged on today's study. Ancillary findings: RIGHT hepatic lobe mass, better described on abdominal MRI of 07/26/2020. IMPRESSION: 1. New ring-enhancing mass within the posterior LEFT breast, inner retroareolar region, measuring 1 cm (series 9, image 65). This may represent a benign breast cyst with pericystic inflammation, but new contralateral disease cannot be excluded. MRI-guided biopsy is recommended. 2. Additional irregular enhancing mass within the posterior LEFT breast, slightly outer, measuring 5 mm (series 9, image 63). Additional contralateral disease not excluded. MRI-guided biopsy is recommended. 3. Previously demonstrated enhancing masses in the RIGHT breast are no longer visualized, consistent with a complete imaging response to interval therapy. Biopsy clips are seen at the sites of the biopsy-proven invasive carcinomas in the RIGHT breast at the 7 o'clock and 9 o'clock axes. 4. No abnormal appearing lymph nodes, however, the upper RIGHT axilla is incompletely imaged on  today's  study. Consider RIGHT axillary ultrasound to evaluate axillary lymph node response to interval treatment. RECOMMENDATION: 1. MRI-guided biopsy of the new ring enhancing mass within the posterior LEFT breast, inner retroareolar region, measuring 1 cm. 2. MRI-guided biopsy of the additional irregular enhancing mass within the posterior LEFT breast, slightly outer, measuring 5 mm. 3. Consider RIGHT axillary ultrasound to ensure axillary lymph node response to interval treatment (RIGHT axilla incompletely imaged on today's MRI). BI-RADS CATEGORY  4: Suspicious. Electronically Signed   By: Franki Cabot M.D.   On: 10/21/2020 09:04   ECHOCARDIOGRAM COMPLETE  Result Date: 10/07/2020    ECHOCARDIOGRAM REPORT   Patient Name:   WANETTE ROBISON Date of Exam: 10/07/2020 Medical Rec #:  [id number]         Height:       67.0 in Accession #:    [id number]        Weight:       169.9 lb Date of Birth:  Dec 17, 1965         BSA:          1.887 m Patient Age:    41 years          BP:           121/67 mmHg Patient Gender: F                 HR:           70 bpm. Exam Location:  [facility] Procedure: 2D Echo, Cardiac Doppler, Color Doppler, 3D Echo and Strain Analysis Indications:    Z09 Chemo  History:        Patient has prior history of Echocardiogram examinations, most                 recent 07/03/2020. Breast cancer; Signs/Symptoms:Chemo.  Sonographer:    Coralyn Helling RDCS Referring Phys: [place]  1. Left ventricular ejection fraction, by estimation, is 60 to 65%. The left ventricle has normal function. The left ventricle has no regional wall motion abnormalities. There is mild concentric left ventricular hypertrophy. Left ventricular diastolic parameters were normal. The average left ventricular global longitudinal strain is -18.1 %. The global longitudinal strain is normal.  2. Right ventricular systolic function is normal. The right ventricular size is normal.  3. A small pericardial  effusion is present. There is no evidence of cardiac tamponade.  4. The mitral valve is normal in structure. Trivial mitral valve regurgitation. No evidence of mitral stenosis.  5. The aortic valve is tricuspid. Aortic valve regurgitation is not visualized. No aortic stenosis is present. Comparison(s): No significant change from prior study. Conclusion(s)/Recommendation(s): Otherwise normal echocardiogram, with minor abnormalities described in the report. FINDINGS  Left Ventricle: Left ventricular ejection fraction, by estimation, is 60 to 65%. The left ventricle has normal function. The left ventricle has no regional wall motion abnormalities. The average left ventricular global longitudinal strain is -18.1 %. The global longitudinal strain is normal. The left ventricular internal cavity size was normal in size. There is mild concentric left ventricular hypertrophy. Left ventricular diastolic parameters were normal. Right Ventricle: The right ventricular size is normal. Right vetricular wall thickness was not well visualized. Right ventricular systolic function is normal. Left Atrium: Left atrial size was normal in size. Right Atrium: Right atrial size was normal in size. Pericardium: A small pericardial effusion is present. There is no evidence of cardiac tamponade. Mitral Valve: The mitral valve is normal in structure. Trivial mitral valve  regurgitation. No evidence of mitral valve stenosis. Tricuspid Valve: The tricuspid valve is normal in structure. Tricuspid valve regurgitation is trivial. No evidence of tricuspid stenosis. Aortic Valve: The aortic valve is tricuspid. Aortic valve regurgitation is not visualized. No aortic stenosis is present. Pulmonic Valve: The pulmonic valve was grossly normal. Pulmonic valve regurgitation is trivial. No evidence of pulmonic stenosis. Aorta: The aortic root, ascending aorta, aortic arch and descending aorta are all structurally normal, with no evidence of dilitation or  obstruction. IAS/Shunts: The atrial septum is grossly normal.  LEFT VENTRICLE PLAX 2D LVIDd:         5.00 cm  Diastology LVIDs:         3.20 cm  LV e' medial:    7.51 cm/s LV PW:         1.10 cm  LV E/e' medial:  9.0 LV IVS:        1.10 cm  LV e' lateral:   8.81 cm/s LVOT diam:     2.00 cm  LV E/e' lateral: 7.7 LV SV:         72 LV SV Index:   38       2D Longitudinal Strain LVOT Area:     3.14 cm 2D Strain GLS (A2C):   -18.6 %                         2D Strain GLS (A3C):   -18.4 %                         2D Strain GLS (A4C):   -17.2 %                         2D Strain GLS Avg:     -18.1 %                          3D Volume EF:                         3D EF:        62 %                         LV EDV:       120 ml                         LV ESV:       45 ml                         LV SV:        75 ml RIGHT VENTRICLE             IVC RV S prime:     13.70 cm/s  IVC diam: 1.40 cm TAPSE (M-mode): 2.4 cm RVSP:           28.0 mmHg LEFT ATRIUM             Index       RIGHT ATRIUM           Index LA diam:        3.60 cm 1.91 cm/m  RA Pressure: 3.00 mmHg LA Vol (A2C):   32.8 ml 17.38 ml/m RA Area:     12.60  cm LA Vol (A4C):   43.6 ml 23.11 ml/m RA Volume:   34.90 ml  18.50 ml/m LA Biplane Vol: 40.5 ml 21.46 ml/m  AORTIC VALVE LVOT Vmax:   121.00 cm/s LVOT Vmean:  72.500 cm/s LVOT VTI:    0.230 m  AORTA Ao Root diam: 3.10 cm Ao Asc diam:  3.00 cm MITRAL VALVE               TRICUSPID VALVE MV Area (PHT): 2.93 cm    TR Peak grad:   25.0 mmHg MV Decel Time: 259 msec    TR Vmax:        250.00 cm/s MV E velocity: 67.70 cm/s  Estimated RAP:  3.00 mmHg MV A velocity: 64.70 cm/s  RVSP:           28.0 mmHg MV E/A ratio:  1.05                            SHUNTS                            Systemic VTI:  0.23 m                            Systemic Diam: 2.00 cm Buford Dresser MD Electronically signed by Buford Dresser MD Signature Date/Time: 10/07/2020/6:14:11 PM    Final    MR TOTAL SPINE METS SCREENING  Result  Date: 10/08/2020 CLINICAL DATA:  55 year old female with history of breast cancer, known osseous metastatic disease. Initial evaluation prior to radionuclide therapy. EXAM: MRI TOTAL SPINE WITHOUT AND WITH CONTRAST TECHNIQUE: Multisequence MR imaging of the spine from the cervical spine to the sacrum was performed prior to and following IV contrast administration for evaluation of spinal metastatic disease. CONTRAST:  7.65m GADAVIST GADOBUTROL 1 MMOL/ML IV SOLN COMPARISON:  Comparison made with prior PET-CT from 08/01/2020, abdominal MRI from 07/26/2020, bone scan or CT from 07/23/2020. FINDINGS: MRI CERVICAL SPINE FINDINGS Alignment: Vertebral bodies normally aligned with preservation of the normal cervical lordosis. No listhesis. Vertebrae: Vertebral body height maintained without acute or chronic fracture. Bone marrow signal intensity within normal limits. No convincing osseous metastases seen within the cervical spine itself. No abnormal marrow edema or enhancement. Cord: Normal signal and morphology. No epidural or intracanalicular tumor. No abnormal enhancement. Posterior Fossa, vertebral arteries, paraspinal tissues: Visualized brain and posterior fossa within normal limits. Craniocervical junction within normal limits. Note made of a 1 cm nonenhancing nodule within the subcutaneous fat of the right occipital scalp (series 19, image 14), nonspecific, but likely benign. Normal flow voids seen within the vertebral arteries bilaterally. Few subcentimeter nodules noted within the right lobe of thyroid, largest of which measures 5 mm, of doubtful significance given size and patient age, no follow-up imaging recommended (ref: J Am Coll Radiol. 2015 Feb;12(2): 143-50).Visualized paraspinous soft tissues otherwise unremarkable. Disc levels: Mild for age noncompressive disc bulging present throughout the cervical spine. No focal disc herniation or significant spinal stenosis. Foramina remain widely patent. MRI  THORACIC SPINE FINDINGS Alignment: Vertebral bodies normally aligned with preservation of the normal thoracic kyphosis. No listhesis. Vertebrae: Scattered focal marrow replacing lesions are seen involving the thoracic spine, consistent with known osseous metastatic disease. The most prominent of these lesions is seen involving the anterior aspect of the T10 vertebral body, and measures approximately 2 cm in size (series 40,  image 9). Additional lesions are fairly small measuring approximately 1 cm or less. Specifically, small lesions are seen involving the T2 vertebral body (series 44, image 31), as well as the T4 through T9 as well as the T12 vertebral bodies (series 40, images 8, 7, 10, 11, 13). Overall, these appear somewhat less pronounced as compared to previous abdominal MRI from 07/26/2020, and may be improved. No significant involvement of the posterior elements. No extra osseous extension of tumor. No associated pathologic fracture or other complication. Cord: Signal intensity within the thoracic spinal cord is within normal limits. No epidural tumor or involvement. No abnormal enhancement. Paraspinal and other soft tissues: Paraspinous soft tissues demonstrate no acute or significant finding. Partially visualized lungs are clear. 1 cm simple cyst noted within the interpolar right kidney. Remainder of the visualized visceral structures otherwise unremarkable. Disc levels: Or narrowing for age multilevel disc desiccation seen throughout the midthoracic spine. Tiny right foraminal disc protrusion noted at the T12-L1 level. No other focal disc herniation. No significant stenosis or neural impingement. MRI LUMBAR SPINE FINDINGS Segmentation: Standard. Lowest well-formed disc space labeled the L5-S1 level. Alignment: 3 mm anterolisthesis of L4 on L5, chronic and facet mediated. Alignment otherwise normal preservation of the normal lumbar lordosis. Vertebrae: Few scattered subcentimeter lesions seen involving the  lumbar spine as well as the visualized sacrum and pelvis, consistent with known osseous metastatic disease. Most prominent of these lesions seen involving the left superior aspect of L1 and measures 9 mm (series 40, image 6). The remainder of the lesions are fairly subtle and subcentimeter in size, perhaps best appreciated on axial T2 sequence (series 48, images 17, 20, 25, 36, 39, 55). Overall, these lesions are slightly less prominent as compared to previous MRI from 07/26/2020. No extra osseous extension of tumor. No pathologic fracture or other complication. Conus medullaris: Extends to the L1 level and appears normal. No malignant epidural or intracanalicular involvement. No abnormal enhancement. 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 Partially visualized visceral structures unremarkable. Disc levels: L1-2: Negative interspace. Mild to moderate facet hypertrophy. No stenosis or impingement. L2-3: Mild disc bulge. Mild to moderate facet hypertrophy. No significant stenosis. L3-4: Small right subarticular disc protrusion indents the right ventral thecal sac (series 48, image 38). Mild bilateral facet hypertrophy. Resultant mild narrowing of the right lateral recess. Central canal and foramina remain patent. L4-5: Anterolisthesis. Mild disc bulge with moderate facet hypertrophy. Resultant mild narrowing of the lateral recesses. Central canal remains patent. Mild bilateral L4 foraminal stenosis. L5-S1: Disc bulge with disc desiccation. Small central disc protrusion indents the ventral thecal sac (series 40, image 50). Mild right worse than left facet hypertrophy. No significant spinal stenosis. Foramina remain patent. IMPRESSION: 1. 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 Most prominent of these lesions measures 2 cm at the T10 vertebral body. No extra osseous extension of tumor, pathologic fracture, or other complication. No epidural or intracanalicular involvement. 2. Mild multilevel degenerative spondylosis as detailed above without significant spinal stenosis or neural impingement. Electronically Signed   By: Jeannine Boga M.D.   On: 10/08/2020 19:22      ELIGIBLE FOR AVAILABLE RESEARCH PROTOCOL: No  ASSESSMENT: 55 y.o. Kim Archer, Kim Archer  LEFT BREAST ALH (1) status post left lumpectomy 03/22/2016 showing atypical lobular hyperplasia  (2) prophylactic  tamoxifen taken from 04/2016 through 03/2019  (a) status post hysterectomy (without bilateral salpingo-oophorectomy) February 2021  (3) genetic testing 05/22/2019 through the my risk hereditary cancer panel offered by [employer] found no deleterious mutations in BRCA, TP53, PALB2 or any of the other genes tested  (a) variants of uncertain significance were found in BRIP1 and NBN  RIGHT BREAST CANCER: METASTATIC DISEASE June 2022 (4) right breast lower outer quadrant biopsy 06/25/2020 shows a clinical T2 N1, stage IB invasive ductal carcinoma, grade 2 or 3, triple positive, with an MIB-1 of 40%.  (A) breast MRI 07/07/2020 shows mcT2 N1 disease as well as an enhancing lesion in the sternum (B) non-contrast Chest CT scan 07/23/2020 shows right axillary and retropetoral adenopathy, also several liver hypodensities and very small lytic/sclerotic bone lesions  (C) bone scan 07/25/2020 shows abnormal uptake at T8, T10, midline lower cervical spine, superior LEFT manubrium and posterior LEFT ninth rib  (D) abdominal MRI with and w/o conttrast 07/26/2020 shows a right hepatic 1.2 cm indeterminate lesion, no other lesions of concern in the liver; bone lesions re-demonstrated, larget at T10  (E) PET scan 08/01/2020 shows no hypermetabolism in the 1.2 cm right liver lesion; bony hypermetabolism consistent with  known mets noted  (F) biopsy T10 to be performed at the end of chemotherapy  (G) brain MRI 08/12/2020 negative (H) CA 27-29 on 08/19/2020 NONINFORMATIVE  (5) neoadjuvant chemotherapy consisting of docetaxel, carboplatin, trastuzumab and pertuzumab every 21 days x 4 cycles started 07/28/2020, last chemotherapy 09/30/2020  (6) anti-HER2 treatment to be continued indefinitely  (a) echocardiogram on 07/03/2020 shows EF of 55-60%  (b) echo 10/07/2020 shows an ejection fraction in the 60-65% range.  (7) anastrozole/ abemaciclib to start at the completion of chemotherapy  (8) zolendronate started 08/19/2020, to be repeated every 12 weeks  (9) restaging studies:  (A) MRI of the brain with and without contrast 08/12/2020 benign  (B) total spinal MRI 10/07/2020 shows stable to improved disease, no extraosseous extension, pathologic fracture, epidural or intracanalicular involvement  (C) MRI of the breast 10/19/2020 shows a complete imaging response in the breast, with new ring-enhancing lesions in the left breast with MRI guided biopsy recommended.   PLAN: Kim Archer has had a wonderful response in the right breast to her chemoimmunotherapy.  We are seeing 2 lesions in the left breast which are new and possibly benign.  These do need to be biopsied.  Also the right axilla was not well imaged by them MRI and we are going to obtain an ultrasound of the right axilla before her next visit.  At this point we are stopping the chemo and continuing the immunotherapy.  I have asked her to keep a very close tab on diarrhea particularly so I would like to know which days she had diarrhea how many bowel movements she had and how well she did with the Imodium.  We will discuss that when she returns in 3 weeks.  She is considering multiple supplements.  She understands that these are promoted as medicines but sold as food so they are not regulated by the FDA, there is no data regarding side effects or effectiveness  from a scientific point of view.  I did ask her to let me know what ever she is taking at this point she is considering biotin and black seed oil.  When she returns to see me in 3 weeks we will review the data, we will likely start either tamoxifen or anastrozole and then at the next visit  we will consider adding a CDK inhibitor.  Total encounter time 35 minutes.Sarajane Jews C. Kohler Pellerito, MD 10/21/20 9:16 AM Medical Oncology and Hematology [facility] [place], [place] [address] Tel. [phone]    Fax. [phone]   I, Wilburn Mylar, am acting as scribe for Dr. Virgie Archer. Merridy Pascoe.  I, Lurline Del MD, have reviewed the above documentation for accuracy and completeness, and I agree with the above.   *Total Encounter Time as defined by the Centers for Medicare and Medicaid Services includes, in addition to the face-to-face time of a patient visit (documented in the note above) non-face-to-face time: obtaining and reviewing outside history, ordering and reviewing medications, tests or procedures, care coordination (communications with other health care professionals or caregivers) and documentation in the medical record.

## 2020-10-21 ENCOUNTER — Inpatient Hospital Stay (HOSPITAL_BASED_OUTPATIENT_CLINIC_OR_DEPARTMENT_OTHER): Payer: 59 | Admitting: Oncology

## 2020-10-21 ENCOUNTER — Other Ambulatory Visit: Payer: Self-pay

## 2020-10-21 ENCOUNTER — Inpatient Hospital Stay: Payer: 59

## 2020-10-21 ENCOUNTER — Inpatient Hospital Stay: Payer: 59 | Attending: Oncology

## 2020-10-21 ENCOUNTER — Encounter: Payer: Self-pay | Admitting: *Deleted

## 2020-10-21 VITALS — BP 133/81 | HR 74 | Temp 98.1°F | Resp 18 | Wt 171.7 lb

## 2020-10-21 DIAGNOSIS — C50511 Malignant neoplasm of lower-outer quadrant of right female breast: Secondary | ICD-10-CM

## 2020-10-21 DIAGNOSIS — Z79811 Long term (current) use of aromatase inhibitors: Secondary | ICD-10-CM | POA: Diagnosis not present

## 2020-10-21 DIAGNOSIS — C773 Secondary and unspecified malignant neoplasm of axilla and upper limb lymph nodes: Secondary | ICD-10-CM | POA: Insufficient documentation

## 2020-10-21 DIAGNOSIS — Z79899 Other long term (current) drug therapy: Secondary | ICD-10-CM | POA: Diagnosis not present

## 2020-10-21 DIAGNOSIS — C7951 Secondary malignant neoplasm of bone: Secondary | ICD-10-CM | POA: Insufficient documentation

## 2020-10-21 DIAGNOSIS — Z170 Estrogen receptor positive status [ER+]: Secondary | ICD-10-CM | POA: Diagnosis not present

## 2020-10-21 DIAGNOSIS — Z5112 Encounter for antineoplastic immunotherapy: Secondary | ICD-10-CM | POA: Diagnosis not present

## 2020-10-21 DIAGNOSIS — Z95828 Presence of other vascular implants and grafts: Secondary | ICD-10-CM

## 2020-10-21 LAB — COMPREHENSIVE METABOLIC PANEL
ALT: 40 U/L (ref 0–44)
AST: 21 U/L (ref 15–41)
Albumin: 3.9 g/dL (ref 3.5–5.0)
Alkaline Phosphatase: 87 U/L (ref 38–126)
Anion gap: 10 (ref 5–15)
BUN: 16 mg/dL (ref 6–20)
CO2: 23 mmol/L (ref 22–32)
Calcium: 9.6 mg/dL (ref 8.9–10.3)
Chloride: 110 mmol/L (ref 98–111)
Creatinine, Ser: 0.73 mg/dL (ref 0.44–1.00)
GFR, Estimated: >60 mL/min (ref >60)
Glucose, Bld: 96 mg/dL (ref 70–99)
Potassium: 3.9 mmol/L (ref 3.5–5.1)
Sodium: 143 mmol/L (ref 135–145)
Total Bilirubin: 0.4 mg/dL (ref 0.3–1.2)
Total Protein: 6.5 g/dL (ref 6.5–8.1)

## 2020-10-21 LAB — CBC WITH DIFFERENTIAL/PLATELET
Abs Immature Granulocytes: 0.08 10*3/uL — ABNORMAL HIGH (ref 0.00–0.07)
Basophils Absolute: 0 10*3/uL (ref 0.0–0.1)
Basophils Relative: 0 %
Eosinophils Absolute: 0 10*3/uL (ref 0.0–0.5)
Eosinophils Relative: 0 %
HCT: 32.7 % — ABNORMAL LOW (ref 36.0–46.0)
Hemoglobin: 11.1 g/dL — ABNORMAL LOW (ref 12.0–15.0)
Immature Granulocytes: 1 %
Lymphocytes Relative: 15 %
Lymphs Abs: 1.6 10*3/uL (ref 0.7–4.0)
MCH: 33.9 pg (ref 26.0–34.0)
MCHC: 33.9 g/dL (ref 30.0–36.0)
MCV: 100 fL (ref 80.0–100.0)
Monocytes Absolute: 1.1 10*3/uL — ABNORMAL HIGH (ref 0.1–1.0)
Monocytes Relative: 10 %
Neutro Abs: 7.9 10*3/uL — ABNORMAL HIGH (ref 1.7–7.7)
Neutrophils Relative %: 74 %
Platelets: 232 10*3/uL (ref 150–400)
RBC: 3.27 MIL/uL — ABNORMAL LOW (ref 3.87–5.11)
RDW: 17.8 % — ABNORMAL HIGH (ref 11.5–15.5)
WBC: 10.8 10*3/uL — ABNORMAL HIGH (ref 4.0–10.5)
nRBC: 0 % (ref 0.0–0.2)

## 2020-10-21 MED ORDER — SODIUM CHLORIDE 0.9 % IV SOLN
420.0000 mg | Freq: Once | INTRAVENOUS | Status: AC
  Administered 2020-10-21: 420 mg via INTRAVENOUS
  Filled 2020-10-21: qty 14

## 2020-10-21 MED ORDER — ACETAMINOPHEN 325 MG PO TABS
650.0000 mg | ORAL_TABLET | Freq: Once | ORAL | Status: AC
  Administered 2020-10-21: 650 mg via ORAL

## 2020-10-21 MED ORDER — DIPHENHYDRAMINE HCL 25 MG PO CAPS
ORAL_CAPSULE | ORAL | Status: AC
  Filled 2020-10-21: qty 1

## 2020-10-21 MED ORDER — HEPARIN SOD (PORK) LOCK FLUSH 100 UNIT/ML IV SOLN
500.0000 [IU] | Freq: Once | INTRAVENOUS | Status: AC | PRN
  Administered 2020-10-21: 500 [IU]

## 2020-10-21 MED ORDER — DIPHENHYDRAMINE HCL 25 MG PO CAPS
25.0000 mg | ORAL_CAPSULE | Freq: Once | ORAL | Status: AC
  Administered 2020-10-21: 25 mg via ORAL

## 2020-10-21 MED ORDER — SODIUM CHLORIDE 0.9% FLUSH
10.0000 mL | INTRAVENOUS | Status: DC | PRN
Start: 2020-10-21 — End: 2020-10-21
  Administered 2020-10-21: 10 mL

## 2020-10-21 MED ORDER — SODIUM CHLORIDE 0.9% FLUSH
10.0000 mL | INTRAVENOUS | Status: DC | PRN
  Administered 2020-10-21: 10 mL via INTRAVENOUS

## 2020-10-21 MED ORDER — TRASTUZUMAB-ANNS CHEMO 150 MG IV SOLR
6.0000 mg/kg | Freq: Once | INTRAVENOUS | Status: AC
  Administered 2020-10-21: 462 mg via INTRAVENOUS
  Filled 2020-10-21: qty 22

## 2020-10-21 MED ORDER — ACETAMINOPHEN 325 MG PO TABS
ORAL_TABLET | ORAL | Status: AC
  Filled 2020-10-21: qty 2

## 2020-10-21 MED ORDER — SODIUM CHLORIDE 0.9 % IV SOLN: Freq: Once | INTRAVENOUS | Status: AC

## 2020-10-21 NOTE — Patient Instructions (Signed)
[facility]  Discharge Instructions: Thank you for choosing [facility] to provide your oncology and hematology care.   If you have a lab appointment with the [facility], please go directly to the [facility] and check in at the registration area.   Wear comfortable clothing and clothing appropriate for easy access to any Portacath or PICC line.   We strive to give you quality time with your provider. You may need to reschedule your appointment if you arrive late (15 or more minutes).  Arriving late affects you and other patients whose appointments are after yours.  Also, if you miss three or more appointments without notifying the office, you may be dismissed from the clinic at the provider's discretion.      For prescription refill requests, have your pharmacy contact our office and allow 72 hours for refills to be completed.    Today you received the following chemotherapy and/or immunotherapy agents Trastuzumab-anns and Pertuzumab      To help prevent nausea and vomiting after your treatment, we encourage you to take your nausea medication as directed.  BELOW ARE SYMPTOMS THAT SHOULD BE REPORTED IMMEDIATELY: *FEVER GREATER THAN 100.4 F (38 C) OR HIGHER *CHILLS OR SWEATING *NAUSEA AND VOMITING THAT IS NOT CONTROLLED WITH YOUR NAUSEA MEDICATION *UNUSUAL SHORTNESS OF BREATH *UNUSUAL BRUISING OR BLEEDING *URINARY PROBLEMS (pain or burning when urinating, or frequent urination) *BOWEL PROBLEMS (unusual diarrhea, constipation, pain near the anus) TENDERNESS IN MOUTH AND THROAT WITH OR WITHOUT PRESENCE OF ULCERS (sore throat, sores in mouth, or a toothache) UNUSUAL RASH, SWELLING OR PAIN  UNUSUAL VAGINAL DISCHARGE OR ITCHING   Items with * indicate a potential emergency and should be followed up as soon as possible or go to the Emergency Department if any problems should occur.  Please show the CHEMOTHERAPY ALERT CARD or IMMUNOTHERAPY ALERT  CARD at check-in to the Emergency Department and triage nurse.  Should you have questions after your visit or need to cancel or reschedule your appointment, please contact [facility]  Dept: [phone]  and follow the prompts.  Office hours are 8:00 a.m. to 4:30 p.m. Monday - Friday. Please note that voicemails left after 4:00 p.m. may not be returned until the following business day.  We are closed weekends and major holidays. You have access to a nurse at all times for urgent questions. Please call the main number to the clinic Dept: [phone] and follow the prompts.   For any non-urgent questions, you may also contact your provider using MyChart. We now offer e-Visits for anyone 65 and older to request care online for non-urgent symptoms. For details visit [email or web address].   Also download the MyChart app! Go to the app store, search "MyChart", open the app, select [facility], and log in with your MyChart username and password.  Due to Covid, a mask is required upon entering the hospital/clinic. If you do not have a mask, one will be given to you upon arrival. For doctor visits, patients may have 1 support person aged 25 or older with them. For treatment visits, patients cannot have anyone with them due to current Covid guidelines and our immunocompromised population.

## 2020-10-21 NOTE — Progress Notes (Signed)
48mn post observation performed without complications.  Pt remained WNL.  Pt d/c home stable and ambulatory.

## 2020-10-22 ENCOUNTER — Other Ambulatory Visit: Payer: Self-pay | Admitting: Oncology

## 2020-10-22 ENCOUNTER — Telehealth: Payer: Self-pay | Admitting: *Deleted

## 2020-10-22 DIAGNOSIS — C7951 Secondary malignant neoplasm of bone: Secondary | ICD-10-CM

## 2020-10-22 DIAGNOSIS — C50511 Malignant neoplasm of lower-outer quadrant of right female breast: Secondary | ICD-10-CM

## 2020-10-22 NOTE — Telephone Encounter (Signed)
This RN spoke with the patient per her call wanting to ask about upcoming biopsy of her breast under MRI.  She states she has had 2 previous biopsies that has caused her a lot of " fear " - she states one of the biopsies " they hit a nerve or something and then the next one I wasn't numb enough and it was extremely painful "  The 3rd and last biopsy she had under MRI " wasn't as bad but when I told the radiology tech she said to me it is better to find the abnormality under ultrasound because that is a lot easier "  Note when [name] was discussing the above- she cried and apologized for becoming emotional.  This RN validated her feelings and fear- verbal support given including use of lorazepam prior to having biopsy to help her.  She asked about the biopsy that she may get of the area in her spine and " I was told I would be like put to sleep " she is asking if all the biopsies can be done at once.  This RN explained how biopsies in the hospital can be done under " conscience sedation " that required monitoring but due to the [facility] being an outpt location they are unable to provide that type of sedation. This RN again discussed use of the lorazepam and how using it before the visit at a slightly higher dose can provide a sense of decreased fear and anxiety - as long as she had a driver for safety.  [name] also asked about " how can a new cancer grow when I have been getting all this chemo " .  This RN discussed that her chemotherapy is based on the biopsy we had previously- and unfortunately cancer can mutate or she can have a different type of breast cancer that is not responding to the drugs we have been giving her.  Per discussion about above and her feelings - pt was able to engage and respond appropriately but she was still emotional.  This RN allowed her time to process - and this RN asked if there is anything else we can help with - she stated " no" and "thank you".

## 2020-10-23 ENCOUNTER — Other Ambulatory Visit: Payer: Self-pay | Admitting: Oncology

## 2020-10-23 ENCOUNTER — Ambulatory Visit: Payer: 59

## 2020-10-23 MED ORDER — LORAZEPAM 0.5 MG PO TABS: 0.5000 mg | ORAL_TABLET | Freq: Three times a day (TID) | ORAL | 1 refills | Status: DC | PRN

## 2020-10-27 ENCOUNTER — Encounter: Payer: Self-pay | Admitting: *Deleted

## 2020-10-28 ENCOUNTER — Other Ambulatory Visit: Payer: Self-pay | Admitting: Oncology

## 2020-10-29 ENCOUNTER — Ambulatory Visit
Admission: RE | Admit: 2020-10-29 | Discharge: 2020-10-29 | Disposition: A | Discharge status: Home / Self Care | Payer: 59 | Source: Ambulatory Visit | Attending: Oncology | Admitting: Oncology

## 2020-10-29 ENCOUNTER — Other Ambulatory Visit: Payer: Self-pay

## 2020-10-29 DIAGNOSIS — C50511 Malignant neoplasm of lower-outer quadrant of right female breast: Secondary | ICD-10-CM

## 2020-10-29 DIAGNOSIS — C7951 Secondary malignant neoplasm of bone: Secondary | ICD-10-CM

## 2020-10-29 IMAGING — US US AXILLARY RIGHT
1 series · 2 of 2 positions shown · non-contrast
Comparison: Previous exam(s).

CLINICAL DATA: Follow-up of abnormal right axillary lymph node post
neoadjuvant chemotherapy.

EXAM:
ULTRASOUND OF THE RIGHT AXILLA

[Series 1: us axillary right · 0.07mm/px · 2 of 2 slices shown]
[im 1/2]
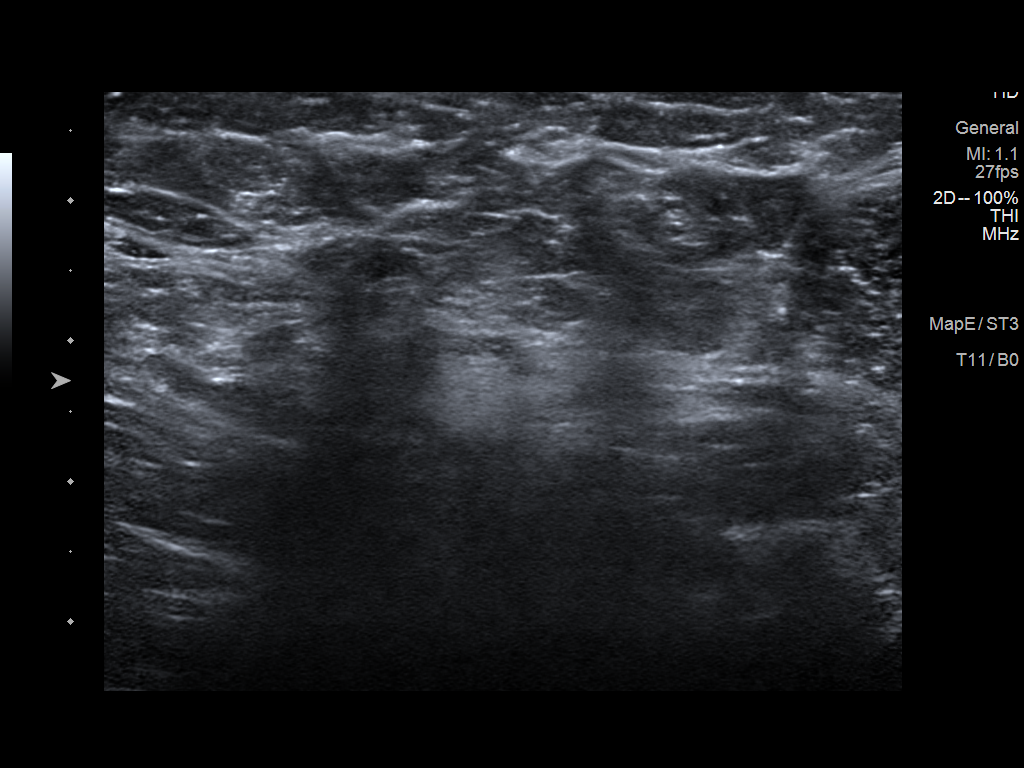
[im 2/2]
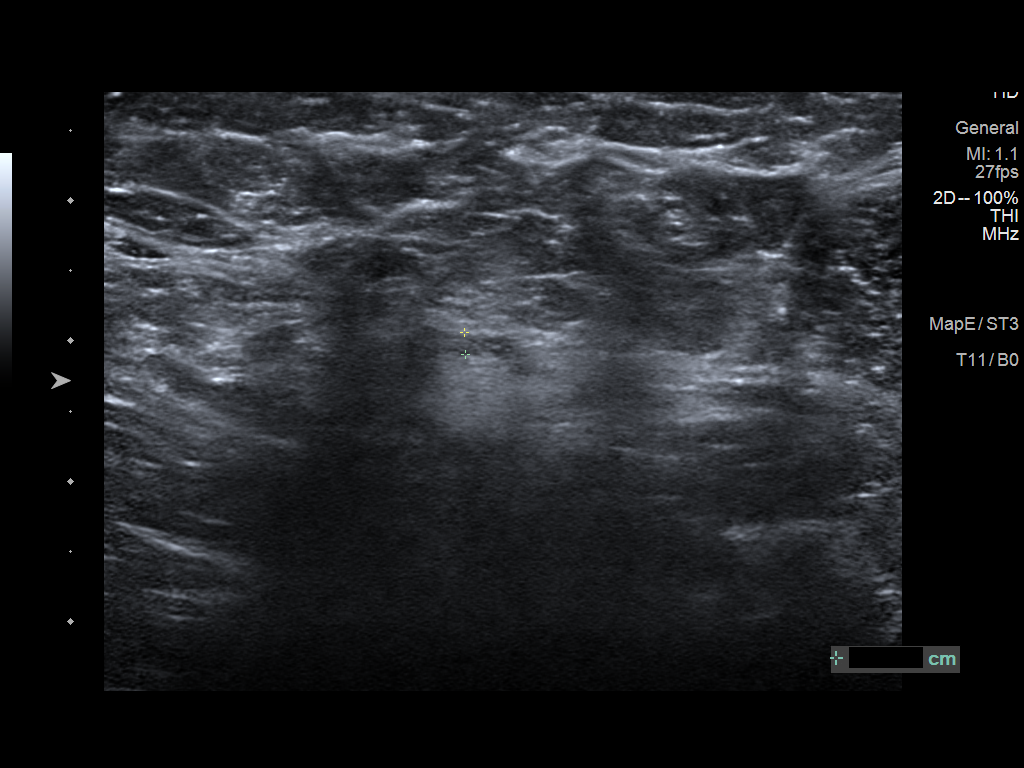

[2 of 2 positions shown; findings below may reference images not displayed]

FINDINGS: Ultrasound of the right axilla is performed, showing no abnormally
enlarged lymph nodes. The previously biopsy lymph node containing
post biopsy clip is not identified with certainty.
IMPRESSION: No sonographic evidence of right axillary lymphadenopathy.

RECOMMENDATION:
Continue with plan of care for known breast cancer.

I have discussed the findings and recommendations with the patient.
If applicable, a reminder letter will be sent to the patient
regarding the next appointment.

BI-RADS CATEGORY  2: Benign.

## 2020-10-30 ENCOUNTER — Encounter: Payer: Self-pay | Admitting: *Deleted

## 2020-10-30 ENCOUNTER — Encounter: Payer: Self-pay | Admitting: Oncology

## 2020-11-10 ENCOUNTER — Ambulatory Visit
Admission: RE | Admit: 2020-11-10 | Discharge: 2020-11-10 | Disposition: A | Discharge status: Home / Self Care | Payer: 59 | Source: Ambulatory Visit | Attending: Oncology | Admitting: Oncology

## 2020-11-10 ENCOUNTER — Other Ambulatory Visit: Payer: Self-pay

## 2020-11-10 ENCOUNTER — Other Ambulatory Visit (HOSPITAL_COMMUNITY): Payer: Self-pay | Admitting: Diagnostic Radiology

## 2020-11-10 DIAGNOSIS — C7951 Secondary malignant neoplasm of bone: Secondary | ICD-10-CM

## 2020-11-10 DIAGNOSIS — C50511 Malignant neoplasm of lower-outer quadrant of right female breast: Secondary | ICD-10-CM

## 2020-11-10 DIAGNOSIS — Z170 Estrogen receptor positive status [ER+]: Secondary | ICD-10-CM

## 2020-11-10 IMAGING — MR MR BREAST BX W LOC DEV 1ST LESION IMAGE BX SPEC MR GUIDE*L*
6 of 8 series · 32 of 48 positions shown · IV contrast (8ml gadavist)
Comparison: Previous exams.
COMPARISON: Previous exams.

Addendum:
CLINICAL DATA: Patient presents for MRI guided biopsy of a small
ring enhancing mass in the posterior left breast and an adjacent
smaller enhancing mass. At the time of biopsy, only the ring
enhancing mass could be identified, much more subtle and faint than
on the MRI performed on [DATE]. The smaller enhancing mass could
not be visualized separate from background. For this reason, only
the subtle ring-enhancing mass was biopsied.

EXAM:
MRI GUIDED CORE NEEDLE BIOPSY OF THE LEFT BREAST
TECHNIQUE: Multiplanar, multisequence MR imaging of the left breast was
performed both before and after administration of intravenous
contrast.
CONTRAST:  7mL GADAVIST GADOBUTROL 1 MMOL/ML IV SOLN

[Series 2: fiducial unilateral · sagittal · 2.0mm · 1.33mm/px · 1 of 52 slices shown]
[im 1/52]
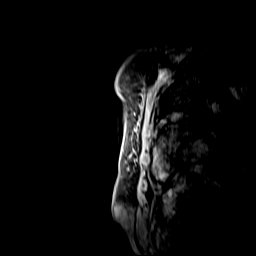

[Series 3: dynamic pre · axial · non-contrast · 1.3mm · 0.73mm/px · z∈[-107,+141]mm · 6 of 192 slices shown]
[im 1/192]
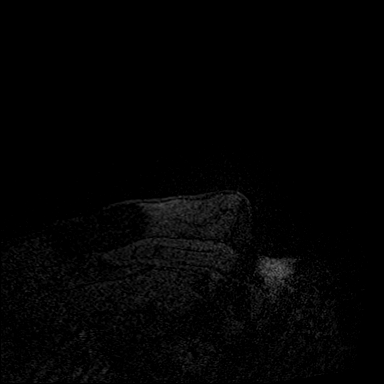
[im 39/192]
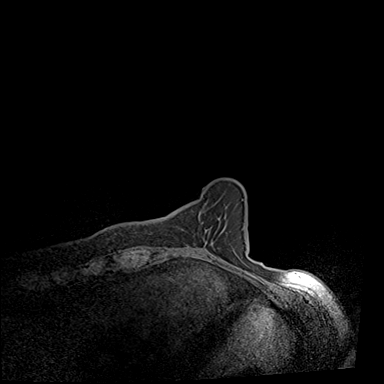
[im 77/192]
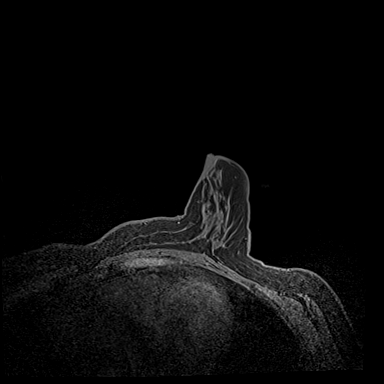
[im 115/192]
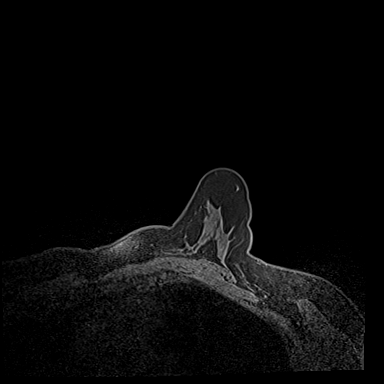
[im 153/192]
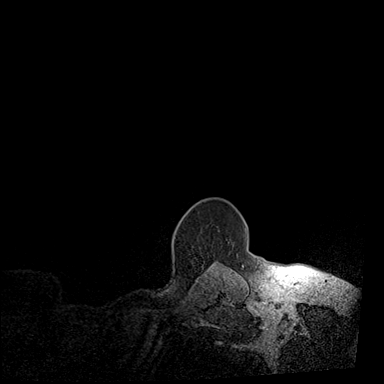
[im 192/192]
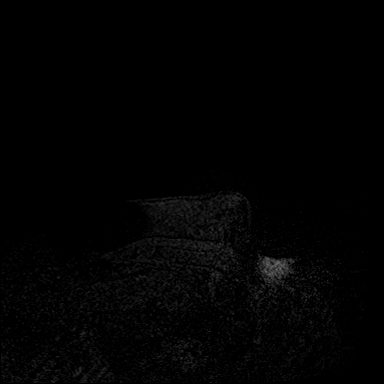

[Series 4: dynamic post 20 · axial · 1.3mm · 0.73mm/px · z∈[-107,+141]mm · 6 of 192 slices shown (1 of 2)]
[im 1/192]
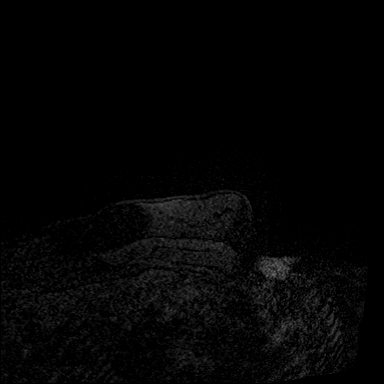
[im 39/192]
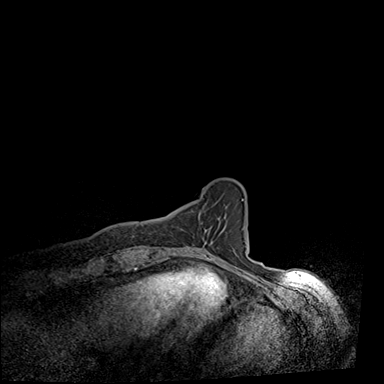
[im 77/192]
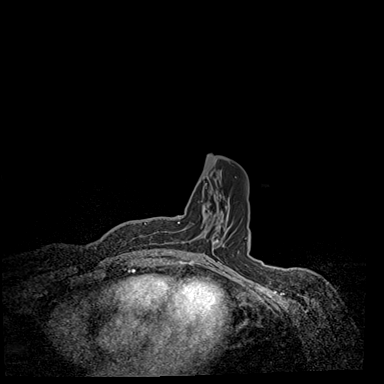
[im 115/192]
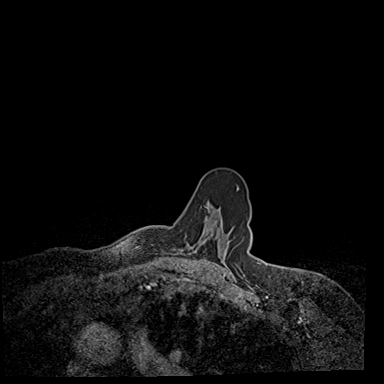
[im 153/192]
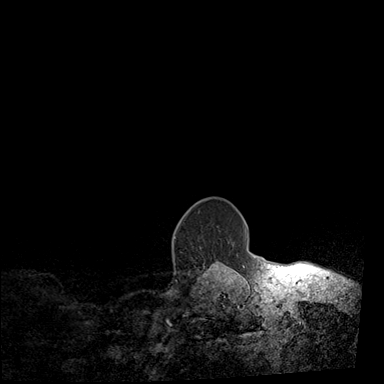
[im 192/192]
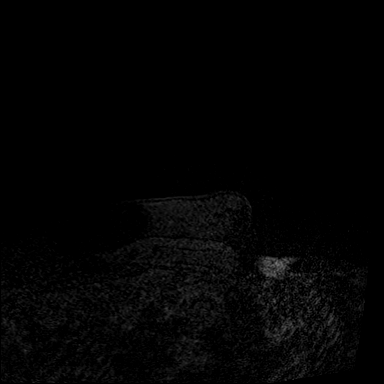

[Series 5: dynamic post 20 · axial · 1.3mm · 0.73mm/px · z∈[-107,+141]mm · 7 of 192 slices shown (2 of 2)]
[im 1/192]
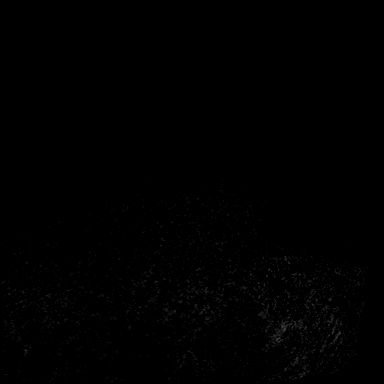
[im 32/192]
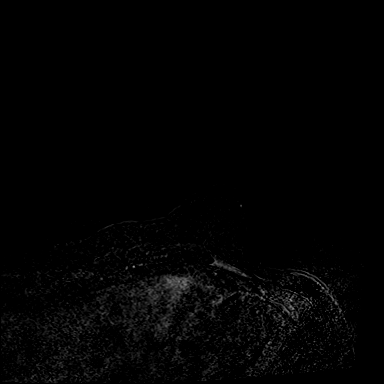
[im 64/192]
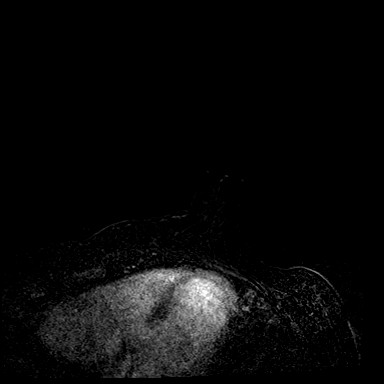
[im 96/192]
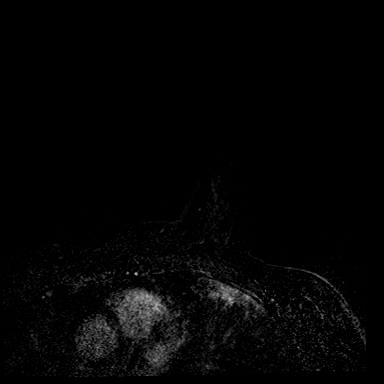
[im 128/192]
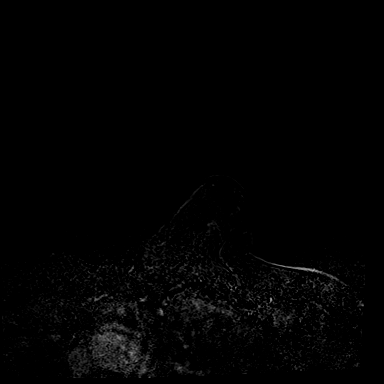
[im 160/192]
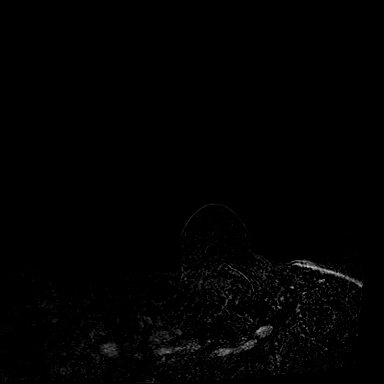
[im 192/192]
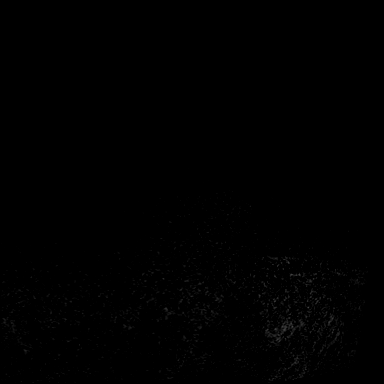

[Series 6: dynamic post 3 · axial · 1.3mm · 0.73mm/px · z∈[-107,+141]mm · 7 of 192 slices shown (1 of 2)]
[im 1/192]
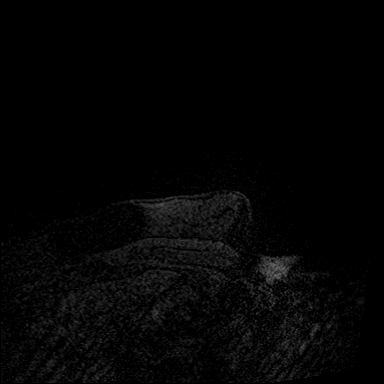
[im 32/192]
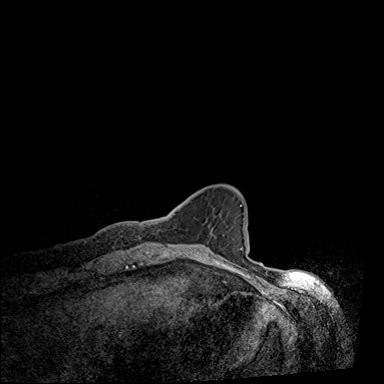
[im 64/192]
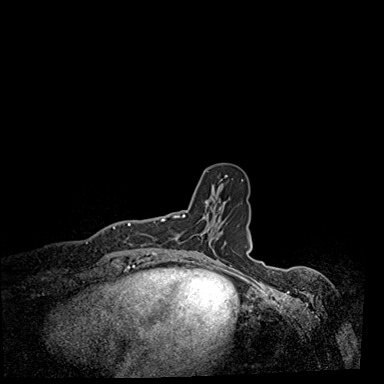
[im 96/192]
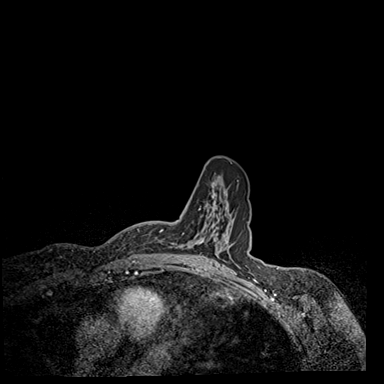
[im 128/192]
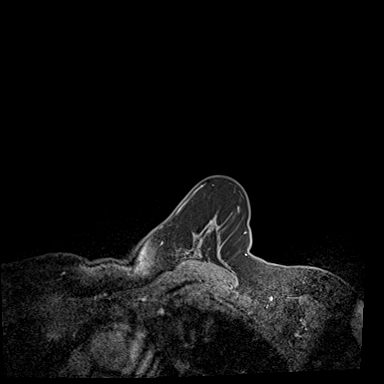
[im 160/192]
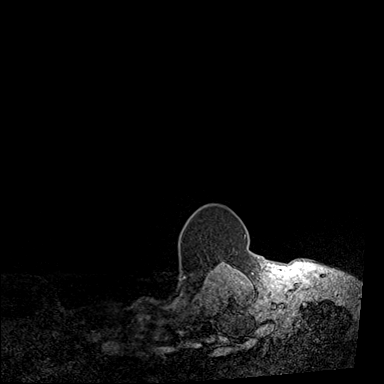
[im 192/192]
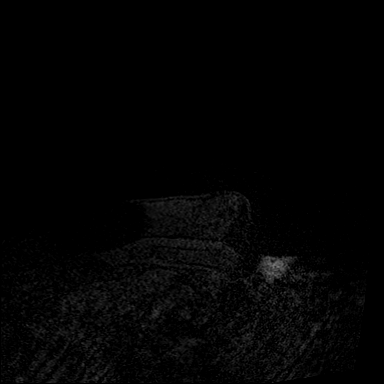

[Series 7: dynamic post 3 · axial · 1.3mm · 0.73mm/px · z∈[-107,+58]mm · 5 of 192 slices shown (2 of 2)]
[im 1/192]
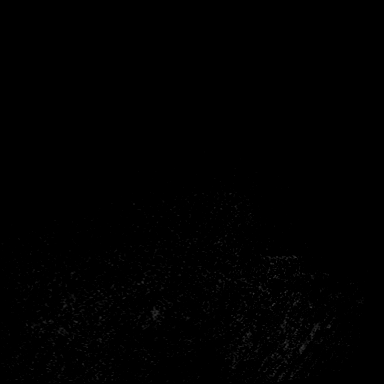
[im 32/192]
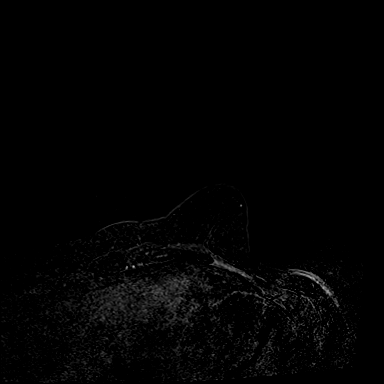
[im 64/192]
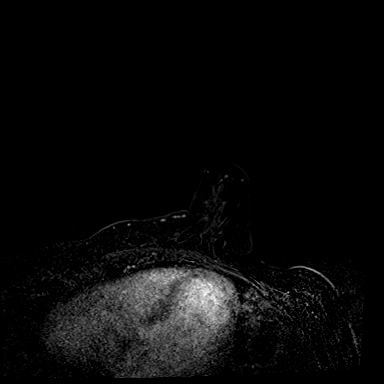
[im 96/192]
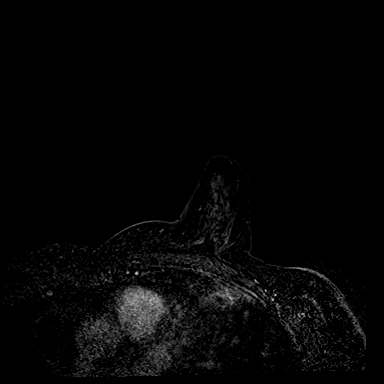
[im 128/192]
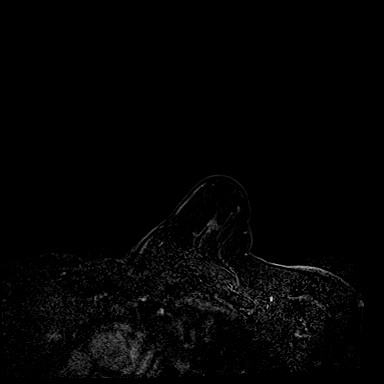

[32 of 48 positions shown; findings below may reference images not displayed]

FINDINGS: I met with the patient, and we discussed the procedure of MRI guided
biopsy, including risks, benefits, and alternatives. Specifically,
we discussed the risks of infection, bleeding, tissue injury, clip
migration, and inadequate sampling. Informed, written consent was
given. The usual time out protocol was performed immediately prior
to the procedure.

Using sterile technique, 1% Lidocaine, MRI guidance, and a 9 gauge
vacuum assisted device, biopsy was performed of the subtle ring
enhancing mass in the posterior, slightly upper outer quadrant of
the left breast, using a lateral approach. At the conclusion of the
procedure, a cylinder shaped tissue marker clip was deployed into
the biopsy cavity. Follow-up 2-view mammogram was performed and
dictated separately.
IMPRESSION: MRI guided biopsy of a small left breast lesion. No apparent
complications.

ADDENDUM:
Pathology revealed FIBROCYSTIC CHANGES WITH USUAL DUCTAL HYPERPLASIA
AND CALCIFICATIONS of the LEFT breast, posterior, upper inner
quadrant, (cylinder clip). This was found to be concordant by Dr.
GAJHEDE.

Pathology results were discussed with the patient by telephone. The
patient reported doing well after the biopsy with tenderness at the
site. Post biopsy instructions and care were reviewed and questions
were answered. The patient was encouraged to call The [REDACTED] for any additional concerns. My direct phone
number was provided.

The patient has a recent diagnosis of RIGHT breast cancer and should
follow her outlined treatment plan.

The patient was instructed to return for a bilateral breast MRI in 6
months, per protocol.

Pathology results reported by GAJHEDE, RN on [DATE].

*** End of Addendum ***
FINDINGS: I met with the patient, and we discussed the procedure of MRI guided
biopsy, including risks, benefits, and alternatives. Specifically,
we discussed the risks of infection, bleeding, tissue injury, clip
migration, and inadequate sampling. Informed, written consent was
given. The usual time out protocol was performed immediately prior
to the procedure.

Using sterile technique, 1% Lidocaine, MRI guidance, and a 9 gauge
vacuum assisted device, biopsy was performed of the subtle ring
enhancing mass in the posterior, slightly upper outer quadrant of
the left breast, using a lateral approach. At the conclusion of the
procedure, a cylinder shaped tissue marker clip was deployed into
the biopsy cavity. Follow-up 2-view mammogram was performed and
dictated separately.
IMPRESSION: MRI guided biopsy of a small left breast lesion. No apparent
complications.

## 2020-11-10 IMAGING — MG MM BREAST LOCALIZATION CLIP
4 series · 4 of 12 positions shown · non-contrast
Comparison: Previous exam(s).

CLINICAL DATA: Evaluate post biopsy marker clip placement following
MRI guided biopsy of a small, peripheral enhancing lesion in the
posterior, slightly upper inner left breast.

EXAM:
3D DIAGNOSTIC LEFT MAMMOGRAM POST MRI BIOPSY

[L CC synth-2D]
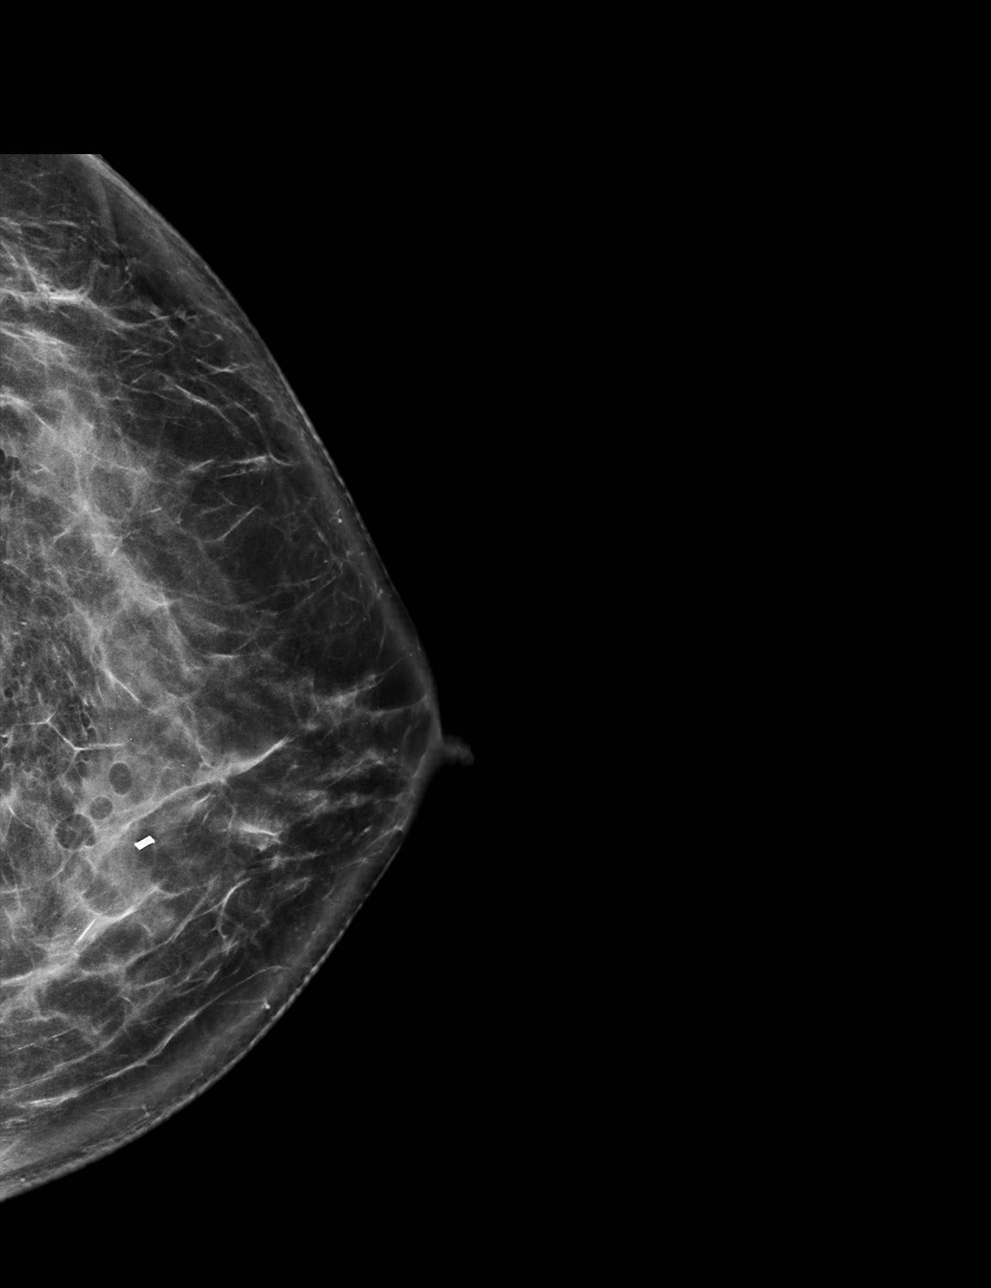

[L MLO synth-2D]
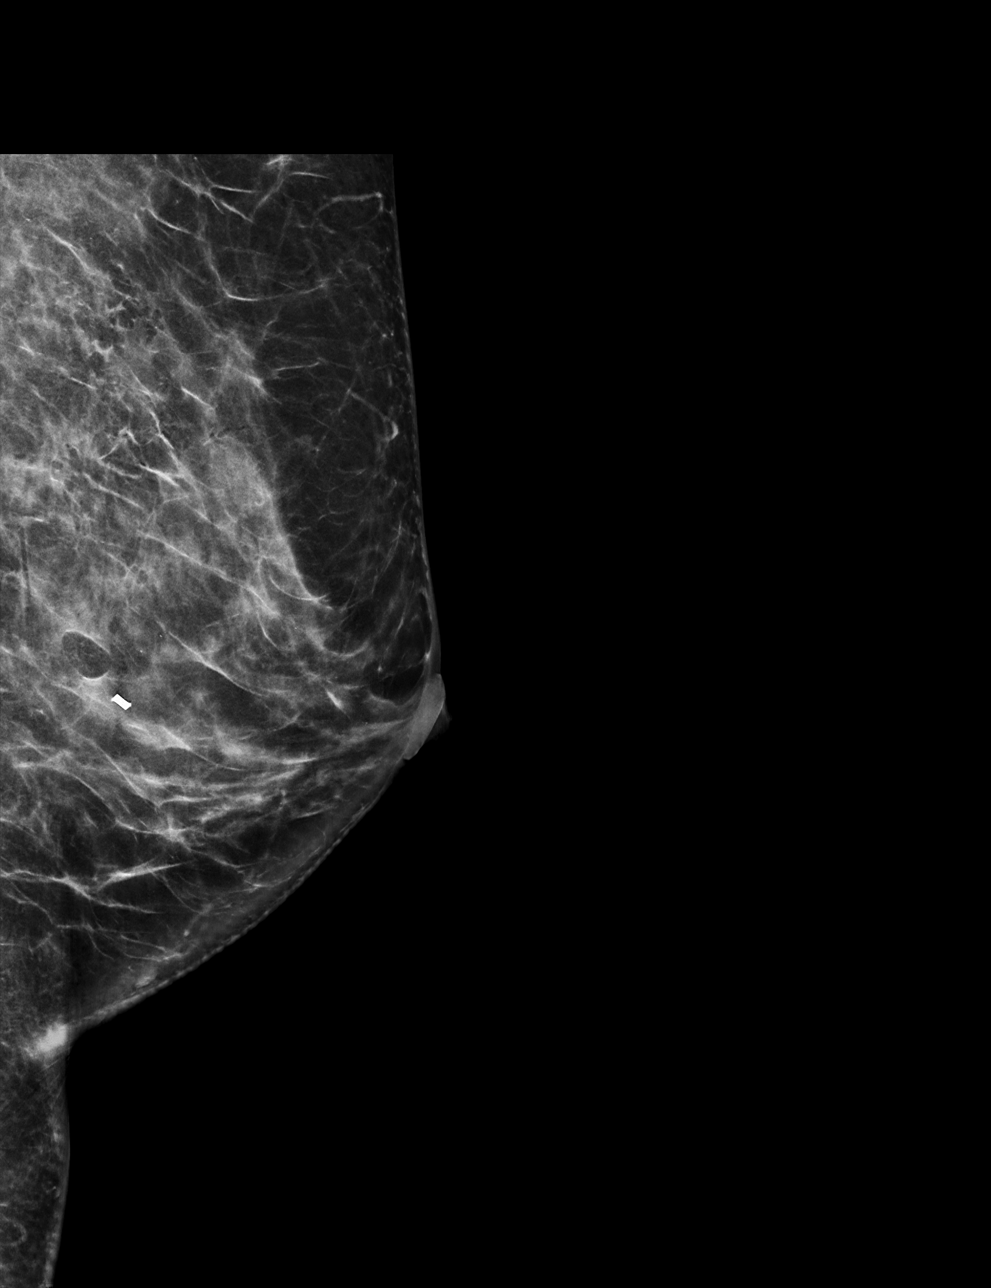

[L MLO tomo · tomo slice 34/67.0]
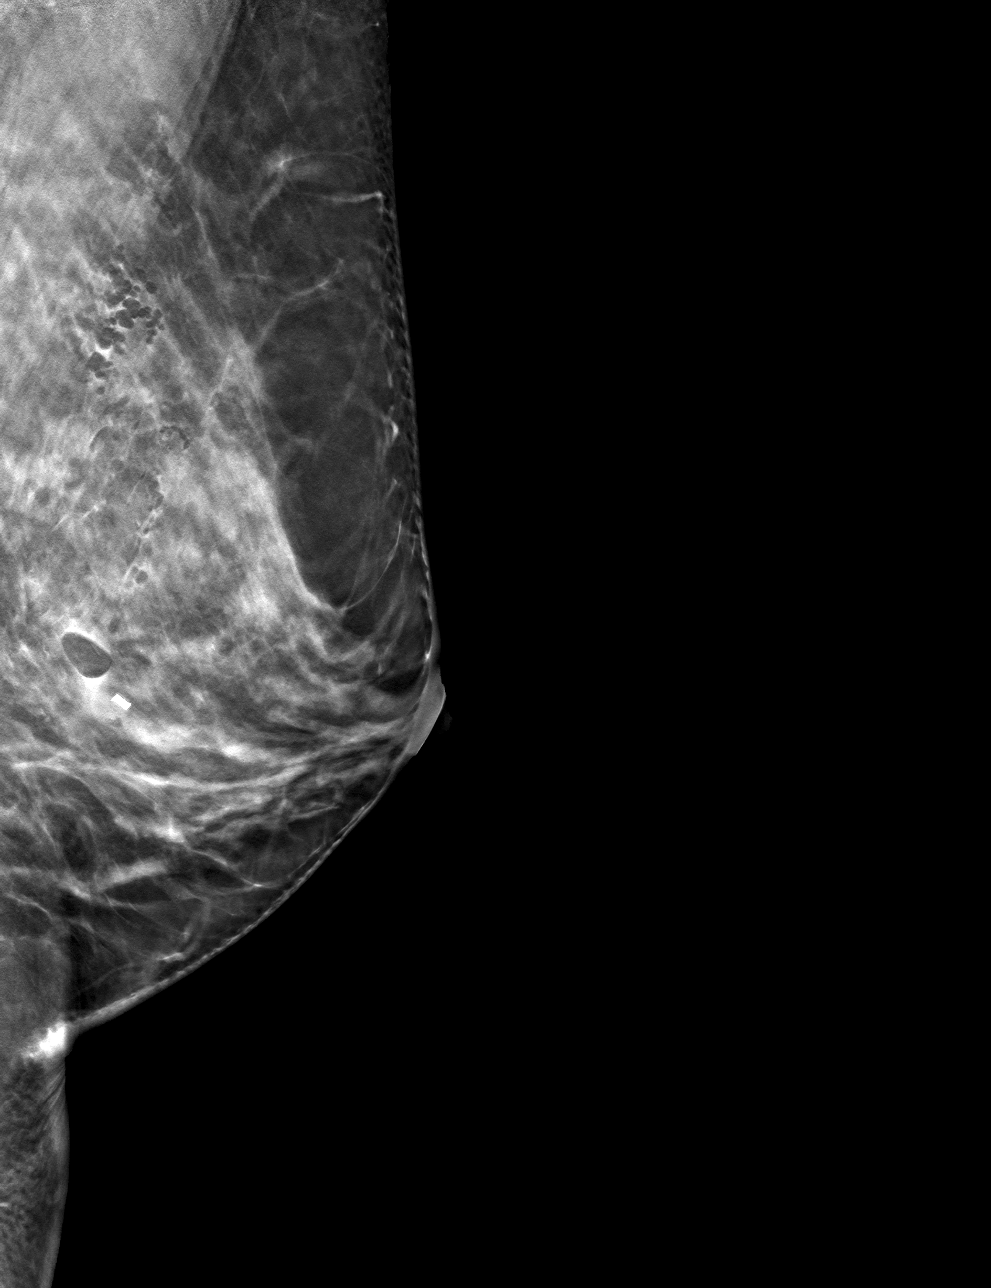

[L CC tomo · tomo slice 38/75.0]
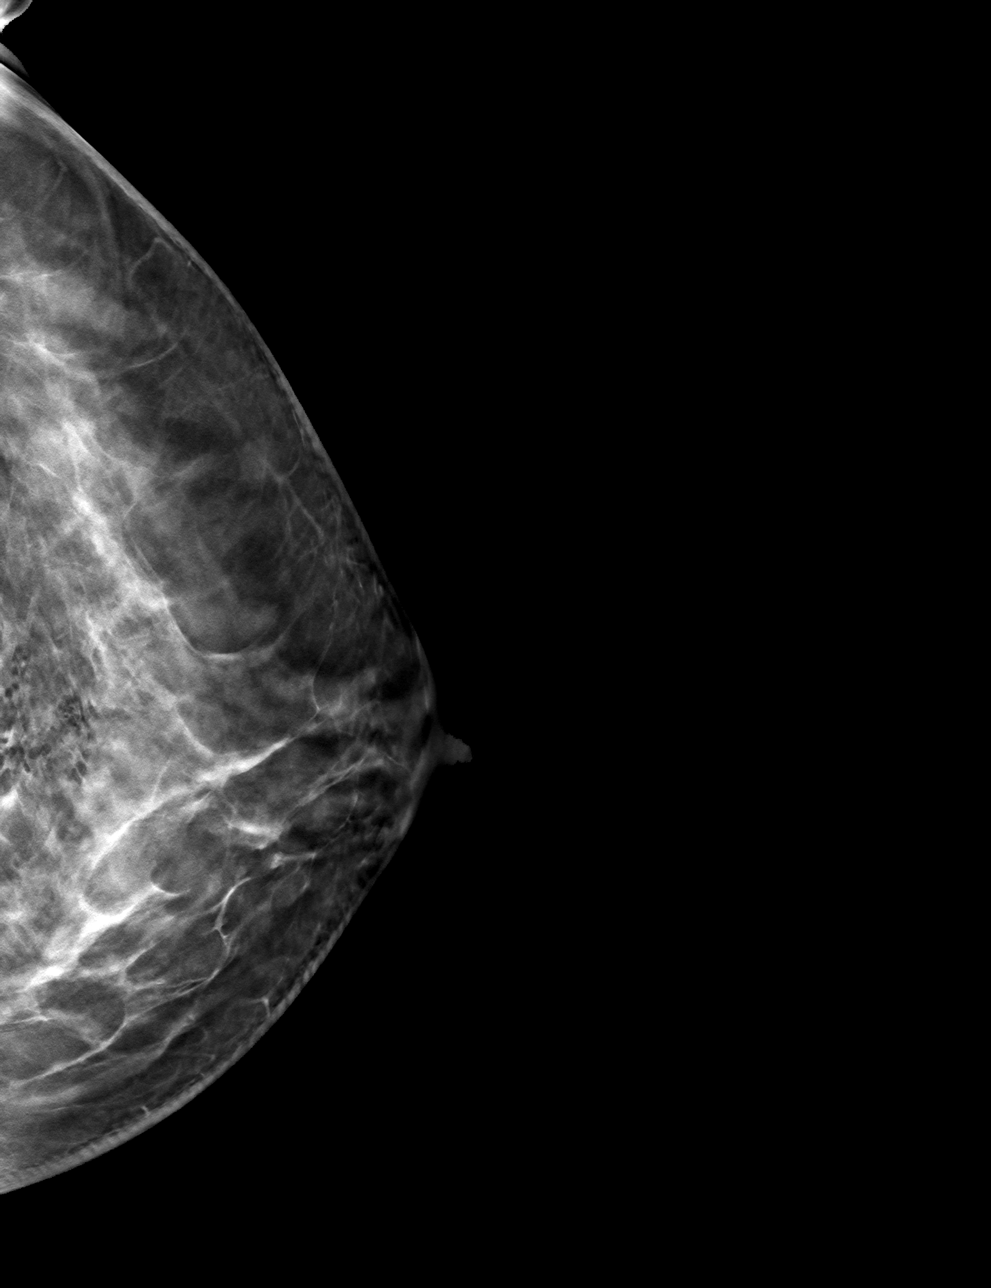

[4 of 12 positions shown; findings below may reference images not displayed]

FINDINGS: 3D Mammographic images were obtained following MRI guided biopsy of
a small peripheral enhancing lesion in the posterior, slightly upper
inner left breast. The biopsy marking clip is in expected position
at the site of biopsy.
IMPRESSION: Appropriate positioning of the cylinder shaped biopsy marking clip
at the site of biopsy in the middle to posterior depth, inner,
slightly upper aspect of the left breast, in the expected location
of the small peripheral enhancing lesion seen on MRI.

Final Assessment: Post Procedure Mammograms for Marker Placement

## 2020-11-10 MED ORDER — GADOBUTROL 1 MMOL/ML IV SOLN
7.0000 mL | Freq: Once | INTRAVENOUS | Status: AC | PRN
  Administered 2020-11-10: 7 mL via INTRAVENOUS

## 2020-11-10 NOTE — Progress Notes (Signed)
[place]  [phone] [phone]    ID: Kim Archer DOB: 10-09-1965  MR#: [id number]  VEH#:[id number]  Patient Care Team: [name], MD as PCP - General (Obstetrics and Gynecology) Lavonta Tillis, Virgie Dad, MD as Consulting Physician (Oncology) Jovita Kussmaul, MD as Consulting Physician (General Surgery) [name], MD as Consulting Physician (Obstetrics and Gynecology) Mauro Kaufmann, RN as Oncology Nurse Navigator Kim Germany, RN as Oncology Nurse Navigator Jovita Kussmaul, MD as Consulting Physician (General Surgery) Camie Hauss, Virgie Dad, MD as Consulting Physician (Oncology) Gery Pray, MD as Consulting Physician (Radiation Oncology) Raina Mina, RPH-CPP (Pharmacist) Chauncey Cruel, MD OTHER MD:  CHIEF COMPLAINT: Triple positive breast cancer  CURRENT TREATMENT: anti-HER2 immunotherapy; denosumab/Xgeva   INTERVAL HISTORY: Kim Archer returns today for follow up and treatment of her triple positive breast cancer.  She is accompanied by her husband Kim Archer.  Since her last visit, she underwent right axilla ultrasound on 10/29/2020 showing no evidence of lymphadenopathy.  She returned for biopsy of the left breast areas yesterday, 11/10/2020. Only one of the two masses was seen at time of biopsy, so only the one biopsy was performed. Pathology results are pending.  We began Zometa with cycle 2 on 08/19/2020.  This is to be repeated every 12 weeks with a dose due today  She met with Dr. Marlou Starks on 10/24/2020 and he is planning a simple lumpectomy, likely no axillary sampling given the negative axillary ultrasound, with the results of the biopsy yesterday pending.   REVIEW OF SYSTEMS: Kim Archer went camping with her husband over the weekend and when she squatted down she really felt her legs hurt.  She is getting a little bit more energy but is still far from where she would like to be.  Her husband is suggesting line dancing as an  exercise.  They do have a treadmill at home which is too boring to use.  She tells me that she has a Archer time concentrating and she will have a phone conversation at work having up on does not have no clue what she just talked about.  She has loose bowel movements a day or 2 after treatment now but nothing else.  Her sense of taste is coming back.  Her mouth is not yet completely normal and she still having a little bit of tearing.  Aside from these issues a detailed review of systems today was stable  COVID 19 VACCINATION STATUS: not vaccinated; infection 10/2019   HISTORY OF CURRENT ILLNESS: From the original intake note:  [place] "Kim Archer" [facility]) was previously seen in the high risk breast cancer clinic on 05/11/2016. She had undergone left lumpectomy on 03/22/2016 showing atypical lobular hyperplasia. She was prescribed tamoxifen at that time, which she believes she took for two years. [She underwent hysterectomy with bilateral salpingo-oophorectomy 03/28/2019, pathology showing leiomyomas and adenomyosis, and may have stopped the tamoxifen around that time].  More recently, she had routine screening mammography on 06/11/2020 showing an area of asymmetry in the right breast. She underwent right diagnostic mammography with tomography and right breast ultrasonography at [facility] on 06/20/2020 showing: breast density category C; 2.4 cm right breast mass at 7 o'clock; one abnormal right axillary lymph node measuring 1.5 cm.  Accordingly on 06/25/2020 she proceeded to biopsy of the right breast area in question. The pathology from this procedure ([id number]) showed: invasive mammary carcinoma, e-cadherin positive, grade 2 or 3. Prognostic indicators significant for: estrogen receptor, 40% positive  and progesterone receptor, >95% positive, both with strong staining intensity. Proliferation marker Ki67 at 40%. HER2 positive by immunohistochemistry (3+).  Biopsy of the right axillary lymph  node performed at the same time showed mammary carcinoma, with no residual lymph node tissue present.  Cancer Staging Malignant neoplasm of lower-outer quadrant of right breast of female, estrogen receptor positive ([place]) Staging form: Breast, AJCC 8th Edition - Clinical stage from 07/02/2020: Stage IV (cT2, cN1, cM1, G3, ER+, PR+, HER2+) - Signed by Chauncey Cruel, MD on 08/04/2020 Stage prefix: Initial diagnosis Histologic grading system: 3 grade system Laterality: Right Staged by: Pathologist and managing physician Stage used in treatment planning: Yes National guidelines used in treatment planning: Yes Type of national guideline used in treatment planning: NCCN  The patient's subsequent history is as detailed below.   PAST MEDICAL HISTORY: Past Medical History:  Diagnosis Date   Allergy    Anemia    due to heavy periods   Atypical ductal hyperplasia of left breast    History of kidney stones    HSV-2 (herpes simplex virus 2) infection    PONV (postoperative nausea and vomiting)    right breast ca 06/2020   Breast cancer    PAST SURGICAL HISTORY: Past Surgical History:  Procedure Laterality Date   BREAST CYST EXCISION Left 03/22/2016   ALH-BENIGN   BREAST LUMPECTOMY WITH RADIOACTIVE SEED LOCALIZATION Left 03/22/2016   Procedure: LEFT BREAST LUMPECTOMY WITH RADIOACTIVE SEED LOCALIZATION;  Surgeon: Autumn Messing III, MD;  Location: [place];  Service: General;  Laterality: Left;   CESAREAN SECTION     x3   COMBINED HYSTEROSCOPY DIAGNOSTIC / D&C  07/17/2018   EYE SURGERY     lt lens replacement   PORTACATH PLACEMENT Left 07/07/2020   Procedure: INSERTION PORT-A-CATH;  Surgeon: Jovita Kussmaul, MD;  Location: [place];  Service: General;  Laterality: Left;   TUBAL LIGATION      FAMILY HISTORY: Family History  Problem Relation Age of Onset   Breast cancer Mother    Melanoma Father    Leukemia Sister    Liver cancer Brother        stomach   Breast cancer  Maternal Aunt    Colon cancer Neg Hx    Colon polyps Neg Hx    Esophageal cancer Neg Hx    Rectal cancer Neg Hx    Stomach cancer Neg Hx   The patient's father died at age 46. He had a history of melanoma. He has a brother, the patient's uncle, with prostate cancer. The patient's mother died in her 64s from urosepsis. She had a history of breast cancer diagnosed in her 26s. The patient has a brother, with cerebral palsy, who had "stomach cancer" metastatic to the liver. The patient has 2 sisters. One of them died from childhood leukemia.    GYNECOLOGIC HISTORY:  No LMP recorded (lmp unknown). Patient has had a hysterectomy. Menarche: 55 years old Age at first live birth: 55 years old [place] P 3 LMP 03/2019, with hysterectomy Contraceptive: used remotely for many years with no complications HRT never used  Hysterectomy? Yes, 03/2019 (path in Epic) BSO? 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 Her husband Kim Archer (goes by "Kim Archer") is Airline pilot for [employer], which manages apartment complexes. The patient's daughter Kim Archer, age 3, works as a Theme park manager in [place]. She has 3 children. She, her husband,  and their three children are living with [place] and [place]. The patient's daughter Kim Archer, age 24, lives in [place]. Kim Archer has legal custody of one of Elina's children. The patient's third child, Kim Quince "Dalbert Mayotte, 53 years old, also lives in [place] and works in Architect. Patient has in total 8 grandchildren. She is a Psychologist, forensic.    ADVANCED DIRECTIVES: In the absence of any documentation to the contrary, the patient's spouse is their HCPOA.    HEALTH MAINTENANCE: Social History   Tobacco Use   Smoking status: Never   Smokeless tobacco: Never  Vaping Use   Vaping Use: Never used  Substance Use Topics   Alcohol use: No   Drug use: No     Colonoscopy:  08/2018 (Dr. Ardis Hughs), recall 2030  PAP: 05/2020  Bone density: 05/2019 (Dr. Christen Butter office)   Allergies  Allergen Reactions   Ciprofloxacin Other (See Comments)    Patient came into office with complaints of difficulty swallowing.  Ate at [employer] prior and took Cipro prior   Sulfa Antibiotics Rash    Current Outpatient Medications  Medication Sig Dispense Refill   Boron 3 MG CAPS Take by mouth.     Calcium 500 MG tablet 1,000 mg.     dexamethasone (DECADRON) 4 MG tablet Take 2 tablets (8 mg total) by mouth 2 (two) times daily. Start the day before Taxotere. Then take daily x 3 days after chemotherapy. 30 tablet 1   Diethylpropion HCl CR 75 MG TB24 diethylpropion ER 75 mg tablet,extended release  TAKE 1 TABLET BY MOUTH EVERY DAY     doxycycline (VIBRA-TABS) 100 MG tablet Take 1 tablet (100 mg total) by mouth 2 (two) times daily. 10 tablet 0   hydrocortisone (ANUSOL-HC) 25 MG suppository Place 1 suppository (25 mg total) rectally daily as needed for hemorrhoids or anal itching. 12 suppository 0   levocetirizine (XYZAL) 5 MG tablet Take 5 mg by mouth every evening.     loratadine (CLARITIN) 10 MG tablet Take 10 mg by mouth daily.     LORazepam (ATIVAN) 0.5 MG tablet TAKE 1 TABLET EVERY 8 HOURS AS NEEDED *MAY INCREASE TO 2 TABLETS EVERY 8 HOURS AS NEEDED 30 tablet 1   LUTEIN PO Take by mouth.     MAGNESIUM PO Take 1 tablet by mouth at bedtime.     Menatetrenone (VITAMIN K2) 100 MCG TABS Take by mouth.     Multiple Vitamin (MULTIVITAMIN WITH MINERALS) TABS tablet Take 1 tablet by mouth daily.     nitrofurantoin, macrocrystal-monohydrate, (MACROBID) 100 MG capsule Take 1 capsule (100 mg total) by mouth 2 (two) times daily. 14 capsule 0   prochlorperazine (COMPAZINE) 10 MG tablet Take 1 tablet (10 mg total) by mouth every 6 (six) hours as needed (Nausea or vomiting). 30 tablet 1   tobramycin-dexamethasone (TOBRADEX) ophthalmic solution Place 1 drop into both eyes in the morning and at  bedtime. 5 mL 0   Ubiquinol 50 MG CAPS Take by mouth daily.     valACYclovir (VALTREX) 500 MG tablet  (Patient not taking: Reported on 07/24/2020)  12   VITAMIN D PO Vitamin D     zinc sulfate 220 (50 Zn) MG capsule Take 220 mg by mouth daily.     No current facility-administered medications for this visit.    OBJECTIVE: White woman who appears stated age  86:   11/11/20 0811  BP: (!) 122/57  Pulse: 63  Resp: 17  Temp: 97.7 F (36.5 C)  SpO2: 100%  Body mass index is 27.27 kg/m.   Wt Readings from Last 3 Encounters:  11/11/20 174 lb 1.6 oz (79 kg)  10/21/20 171 lb 11.2 oz (77.9 kg)  09/30/20 169 lb 14.4 oz (77.1 kg)     ECOG FS:1 - Symptomatic but completely ambulatory  Sclerae unicteric, EOMs intact Wearing a mask No cervical or supraclavicular adenopathy Lungs no rales or rhonchi Heart regular rate and rhythm Abd soft, nontender, positive bowel sounds MSK no focal spinal tenderness, no upper extremity lymphedema Neuro: nonfocal, well oriented, appropriate affect Breasts: The right breast shows no skin or nipple changes of concern and no palpable mass.  The right axilla left breast and left axilla are benign  LAB RESULTS:  CMP     Component Value Date/Time   NA 144 11/11/2020 0757   NA 142 05/11/2016 1508   K 3.9 11/11/2020 0757   K 4.3 05/11/2016 1508   CL 110 11/11/2020 0757   CO2 22 11/11/2020 0757   CO2 26 05/11/2016 1508   GLUCOSE 95 11/11/2020 0757   GLUCOSE 90 05/11/2016 1508   BUN 18 11/11/2020 0757   BUN 10.9 05/11/2016 1508   CREATININE 0.77 11/11/2020 0757   CREATININE 0.77 08/04/2020 1540   CREATININE 1.0 05/11/2016 1508   CALCIUM 9.4 11/11/2020 0757   CALCIUM 9.5 05/11/2016 1508   PROT 6.5 11/11/2020 0757   PROT 7.0 05/11/2016 1508   ALBUMIN 3.9 11/11/2020 0757   ALBUMIN 3.9 05/11/2016 1508   AST 26 11/11/2020 0757   AST 29 08/04/2020 1540   AST 19 05/11/2016 1508   ALT 40 11/11/2020 0757   ALT 28 08/04/2020 1540   ALT 23  05/11/2016 1508   ALKPHOS 87 11/11/2020 0757   ALKPHOS 98 05/11/2016 1508   BILITOT 0.5 11/11/2020 0757   BILITOT 0.3 08/04/2020 1540   BILITOT 0.36 05/11/2016 1508   GFRNONAA >60 11/11/2020 0757   GFRNONAA >60 08/04/2020 1540    No results found for: TOTALPROTELP, ALBUMINELP, A1GS, A2GS, BETS, BETA2SER, GAMS, MSPIKE, SPEI  Lab Results  Component Value Date   WBC 4.0 11/11/2020   NEUTROABS 1.9 11/11/2020   HGB 12.5 11/11/2020   HCT 38.3 11/11/2020   MCV 101.3 (H) 11/11/2020   PLT 186 11/11/2020    No results found for: LABCA2  No components found for: [id number]  No results for input(s): INR in the last 168 hours.  No results found for: LABCA2  No results found for: ZHG992  No results found for: EQA834  No results found for: HDQ222  Lab Results  Component Value Date   CA2729 37.8 08/21/2020    No components found for: HGQUANT  No results found for: CEA1 / No results found for: CEA1   No results found for: AFPTUMOR  No results found for: CHROMOGRNA  No results found for: KPAFRELGTCHN, LAMBDASER, KAPLAMBRATIO (kappa/lambda light chains)  No results found for: HGBA, HGBA2QUANT, HGBFQUANT, HGBSQUAN (Hemoglobinopathy evaluation)   No results found for: LDH  No results found for: IRON, TIBC, IRONPCTSAT (Iron and TIBC)  No results found for: FERRITIN  Urinalysis    Component Value Date/Time   COLORURINE AMBER (A) 10/03/2020 1003   APPEARANCEUR CLEAR 10/03/2020 1003   LABSPEC 1.012 10/03/2020 1003   PHURINE 6.0 10/03/2020 1003   GLUCOSEU NEGATIVE 10/03/2020 1003   HGBUR LARGE (A) 10/03/2020 1003   BILIRUBINUR NEGATIVE 10/03/2020 1003   KETONESUR NEGATIVE 10/03/2020 1003   PROTEINUR NEGATIVE 10/03/2020 1003   NITRITE POSITIVE (A) 10/03/2020 1003   LEUKOCYTESUR LARGE (  A) 10/03/2020 1003    STUDIES: MR BREAST BILATERAL W WO CONTRAST INC CAD  Result Date: 10/21/2020 CLINICAL DATA:  Biopsy-proven RIGHT breast cancer at the 7 o'clock axis and  biopsy-proven lymph node metastasis in the RIGHT axilla (ultrasound-guided biopsies performed on 06/25/2020). Additional biopsy-proven RIGHT breast cancer at the 9 o'clock axis (biopsy performed on 07/11/2020). PET-CT performed on 08/04/2020 describing hypermetabolic lesions in the RIGHT breast and RIGHT axilla and osseous metastases. This was confirmed on MRI of the spine dated 10/07/2020 and described as questionably improved compared to abdominal MRI of 07/26/2020. Currently on chemotherapy.  Assess treatment response. LABS:  Not performed at imaging site. EXAM: BILATERAL BREAST MRI WITH AND WITHOUT CONTRAST TECHNIQUE: Multiplanar, multisequence MR images of both breasts were obtained prior to and following the intravenous administration of 8 ml of Gadavist Three-dimensional MR images were rendered by post-processing of the original MR data on an independent workstation. The three-dimensional MR images were interpreted, and findings are reported in the following complete MRI report for this study. Three dimensional images were evaluated at the independent interpreting workstation using the DynaCAD thin client. COMPARISON:  Previous exams including breast MRI dated 07/07/2020. FINDINGS: Breast composition: c. Heterogeneous fibroglandular tissue. Background parenchymal enhancement: Minimal Right breast: Previously demonstrated masses within the outer RIGHT breast (7 o'clock and 9 o'clock axes) are both resolved, consistent with a complete imaging response to interval treatment. Biopsy clip artifact remains at the 7 and 9 o'clock axes. There are now no enhancing masses, non-mass enhancement or secondary signs of malignancy in the RIGHT breast. Left breast: New ring-enhancing mass within the posterior LEFT breast, inner retroareolar, measuring 1 cm, with persistent enhancement kinetics (series 9, image 65). Additional smaller irregular enhancing mass within the posterior LEFT breast, slightly outer, measuring 5 mm,  with persistent enhancement kinetics (series 9, image 63). Lymph nodes: No abnormal appearing lymph nodes, however, the upper RIGHT axilla is incompletely imaged on today's study. Ancillary findings: RIGHT hepatic lobe mass, better described on abdominal MRI of 07/26/2020. IMPRESSION: 1. New ring-enhancing mass within the posterior LEFT breast, inner retroareolar region, measuring 1 cm (series 9, image 65). This may represent a benign breast cyst with pericystic inflammation, but new contralateral disease cannot be excluded. MRI-guided biopsy is recommended. 2. Additional irregular enhancing mass within the posterior LEFT breast, slightly outer, measuring 5 mm (series 9, image 63). Additional contralateral disease not excluded. MRI-guided biopsy is recommended. 3. Previously demonstrated enhancing masses in the RIGHT breast are no longer visualized, consistent with a complete imaging response to interval therapy. Biopsy clips are seen at the sites of the biopsy-proven invasive carcinomas in the RIGHT breast at the 7 o'clock and 9 o'clock axes. 4. No abnormal appearing lymph nodes, however, the upper RIGHT axilla is incompletely imaged on today's study. Consider RIGHT axillary ultrasound to evaluate axillary lymph node response to interval treatment. RECOMMENDATION: 1. MRI-guided biopsy of the new ring enhancing mass within the posterior LEFT breast, inner retroareolar region, measuring 1 cm. 2. MRI-guided biopsy of the additional irregular enhancing mass within the posterior LEFT breast, slightly outer, measuring 5 mm. 3. Consider RIGHT axillary ultrasound to ensure axillary lymph node response to interval treatment (RIGHT axilla incompletely imaged on today's MRI). BI-RADS CATEGORY  4: Suspicious. Electronically Signed   By: Franki Cabot M.D.   On: 10/21/2020 09:04  [place] AXILLA RIGHT  Result Date: 10/29/2020 CLINICAL DATA:  Follow-up of abnormal right axillary lymph node post neoadjuvant chemotherapy. EXAM:  ULTRASOUND OF THE RIGHT AXILLA COMPARISON:  Previous exam(s). FINDINGS: Ultrasound of the right axilla is performed, showing no abnormally enlarged lymph nodes. The previously biopsy lymph node containing post biopsy clip is not identified with certainty. IMPRESSION: No sonographic evidence of right axillary lymphadenopathy. RECOMMENDATION: Continue with plan of care for known breast cancer. I have discussed the findings and recommendations with the patient. If applicable, a reminder letter will be sent to the patient regarding the next appointment. BI-RADS CATEGORY  2: Benign. Electronically Signed   By: Fidela Salisbury M.D.   On: 10/29/2020 15:50  MM CLIP PLACEMENT LEFT  Result Date: 11/10/2020 CLINICAL DATA:  Evaluate post biopsy marker clip placement following MRI guided biopsy of a small, peripheral enhancing lesion in the posterior, slightly upper inner left breast. EXAM: 3D DIAGNOSTIC LEFT MAMMOGRAM POST MRI BIOPSY COMPARISON:  Previous exam(s). FINDINGS: 3D Mammographic images were obtained following MRI guided biopsy of a small peripheral enhancing lesion in the posterior, slightly upper inner left breast. The biopsy marking clip is in expected position at the site of biopsy. IMPRESSION: Appropriate positioning of the cylinder shaped biopsy marking clip at the site of biopsy in the middle to posterior depth, inner, slightly upper aspect of the left breast, in the expected location of the small peripheral enhancing lesion seen on MRI. Final Assessment: Post Procedure Mammograms for Marker Placement Electronically Signed   By: Lajean Manes M.D.   On: 11/10/2020 09:48  MR LT BREAST BX W LOC DEV 1ST LESION IMAGE BX SPEC MR GUIDE  Result Date: 11/10/2020 CLINICAL DATA:  Patient presents for MRI guided biopsy of a small ring enhancing mass in the posterior left breast and an adjacent smaller enhancing mass. At the time of biopsy, only the ring enhancing mass could be identified, much more subtle and  faint than on the MRI performed on 10/19/2020. The smaller enhancing mass could not be visualized separate from background. For this reason, only the subtle ring-enhancing mass was biopsied. EXAM: MRI GUIDED CORE NEEDLE BIOPSY OF THE LEFT BREAST TECHNIQUE: Multiplanar, multisequence MR imaging of the left breast was performed both before and after administration of intravenous contrast. CONTRAST:  84m GADAVIST GADOBUTROL 1 MMOL/ML IV SOLN COMPARISON:  Previous exams. FINDINGS: I met with the patient, and we discussed the procedure of MRI guided biopsy, including risks, benefits, and alternatives. Specifically, we discussed the risks of infection, bleeding, tissue injury, clip migration, and inadequate sampling. Informed, written consent was given. The usual time out protocol was performed immediately prior to the procedure. Using sterile technique, 1% Lidocaine, MRI guidance, and a 9 gauge vacuum assisted device, biopsy was performed of the subtle ring enhancing mass in the posterior, slightly upper outer quadrant of the left breast, using a lateral approach. At the conclusion of the procedure, a cylinder shaped tissue marker clip was deployed into the biopsy cavity. Follow-up 2-view mammogram was performed and dictated separately. IMPRESSION: MRI guided biopsy of a small left breast lesion. No apparent complications. Electronically Signed   By: DLajean ManesM.D.   On: 11/10/2020 09:47      ELIGIBLE FOR AVAILABLE RESEARCH PROTOCOL: No  ASSESSMENT: 55y.o. [place]  LEFT BREAST ALH (1) status post left lumpectomy 03/22/2016 showing atypical lobular hyperplasia  (2) prophylactic tamoxifen taken from 04/2016 through 03/2019  (a) status post hysterectomy (without bilateral salpingo-oophorectomy) February 2021  (3) genetic testing 05/22/2019 through the my risk hereditary cancer panel offered by [facility] found no deleterious mutations in BRCA, TP53, PALB2 or any of the other genes tested  (  a) variants  of uncertain significance were found in BRIP1 and NBN  RIGHT BREAST CANCER: METASTATIC DISEASE June 2022 (4) right breast lower outer quadrant biopsy 06/25/2020 shows a clinical T2 N1, stage IB invasive ductal carcinoma, grade 2 or 3, triple positive, with an MIB-1 of 40%.  (A) breast MRI 07/07/2020 shows mcT2 N1 disease as well as an enhancing lesion in the sternum (B) non-contrast Chest CT scan 07/23/2020 shows right axillary and retropetoral adenopathy, also several liver hypodensities and very small lytic/sclerotic bone lesions  (C) bone scan 07/25/2020 shows abnormal uptake at T8, T10, midline lower cervical spine, superior LEFT manubrium and posterior LEFT ninth rib  (D) abdominal MRI with and w/o conttrast 07/26/2020 shows a right hepatic 1.2 cm indeterminate lesion, no other lesions of concern in the liver; bone lesions re-demonstrated, larget at T10  (E) PET scan 08/01/2020 shows no hypermetabolism in the 1.2 cm right liver lesion; bony hypermetabolism consistent with known mets noted  (F) biopsy T10 to be performed at the end of chemotherapy  (G) brain MRI 08/12/2020 negative (H) CA 27-29 on 08/19/2020 NONINFORMATIVE  (5) neoadjuvant chemotherapy consisting of docetaxel, carboplatin, trastuzumab and pertuzumab every 21 days x 4 cycles started 07/28/2020, last chemotherapy 09/30/2020  (6) anti-HER2 treatment to be continued indefinitely  (a) echocardiogram on 07/03/2020 shows EF of 55-60%  (b) echo 10/07/2020 shows an ejection fraction in the 60-65% range.  (7) no adjuvant radiation planned (discussed 10/22/2020 conference)  (8) zoledronate started 08/19/2020, to be repeated every 12 weeks  (9) restaging studies:  (A) MRI of the brain with and without contrast 08/12/2020 benign  (B) total spinal MRI 10/07/2020 shows stable to improved disease, no extraosseous extension, pathologic fracture, epidural or intracanalicular involvement  (C) MRI of the breast 10/19/2020 shows a complete  imaging response in the breast, with new ring-enhancing lesions in the left breast with MRI guided biopsy recommended  (10) right lumpectomy with no sentinel lymph node sampling pending  (11) anastrozole +/- abemaciclib to start after surgery   PLAN: Kim Archer tolerated her biopsy yesterday without difficulty.  We do not have the results yet.  I expect them to be benign.   She wanted to know why she would need a lumpectomy since she still has cancer in the bones.  Clearly doing a lumpectomy will not affect her survival.  However it will affect her follow-up.  If there may be cancer left in the breast then I may have to do MRIs more frequently.  If we go in and demonstrate that there has been a complete pathologic response in the breast then I think we could do 1 MRI once a year perhaps and that might be all we need to make sure there is no cancer there.  I think the decision not to do lymph node sampling now that we know the axillary ultrasound is negative is a good decision.  We reviewed her medications and today she will receive Herceptin and Perjeta as well as zoledronate.  We are going to continue the Perjeta at least 1 year unless she develops significant side effects from it.  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  We discussed exercise for her and particularly suggested tai chi.  She will return to see me in about 6 weeks.  She should have had her surgery by then.  Total encounter time 35 minutes.Sarajane Jews C.  Osmel Dykstra, MD 11/11/20 8:45 AM Medical Oncology and Hematology [facility] [place], [place] [address] Tel. [phone]    Fax. [phone]   I, Wilburn Mylar, am acting as scribe for Dr. Virgie Dad. Jontavia Leatherbury.  I, Lurline Del MD, have reviewed the above documentation for accuracy and completeness, and I agree with the  above.   *Total Encounter Time as defined by the Centers for Medicare and Medicaid Services includes, in addition to the face-to-face time of a patient visit (documented in the note above) non-face-to-face time: obtaining and reviewing outside history, ordering and reviewing medications, tests or procedures, care coordination (communications with other health care professionals or caregivers) and documentation in the medical record.

## 2020-11-11 ENCOUNTER — Inpatient Hospital Stay: Payer: 59

## 2020-11-11 ENCOUNTER — Inpatient Hospital Stay (HOSPITAL_BASED_OUTPATIENT_CLINIC_OR_DEPARTMENT_OTHER): Payer: 59 | Admitting: Oncology

## 2020-11-11 ENCOUNTER — Encounter: Payer: Self-pay | Admitting: *Deleted

## 2020-11-11 VITALS — BP 122/57 | HR 63 | Temp 97.7°F | Resp 17 | Wt 174.1 lb

## 2020-11-11 DIAGNOSIS — C50511 Malignant neoplasm of lower-outer quadrant of right female breast: Secondary | ICD-10-CM

## 2020-11-11 DIAGNOSIS — C7951 Secondary malignant neoplasm of bone: Secondary | ICD-10-CM | POA: Diagnosis not present

## 2020-11-11 DIAGNOSIS — Z7189 Other specified counseling: Secondary | ICD-10-CM | POA: Diagnosis not present

## 2020-11-11 DIAGNOSIS — M899 Disorder of bone, unspecified: Secondary | ICD-10-CM

## 2020-11-11 DIAGNOSIS — Z95828 Presence of other vascular implants and grafts: Secondary | ICD-10-CM

## 2020-11-11 DIAGNOSIS — Z170 Estrogen receptor positive status [ER+]: Secondary | ICD-10-CM | POA: Diagnosis not present

## 2020-11-11 DIAGNOSIS — Z5112 Encounter for antineoplastic immunotherapy: Secondary | ICD-10-CM | POA: Diagnosis not present

## 2020-11-11 LAB — COMPREHENSIVE METABOLIC PANEL
ALT: 40 U/L (ref 0–44)
AST: 26 U/L (ref 15–41)
Albumin: 3.9 g/dL (ref 3.5–5.0)
Alkaline Phosphatase: 87 U/L (ref 38–126)
Anion gap: 12 (ref 5–15)
BUN: 18 mg/dL (ref 6–20)
CO2: 22 mmol/L (ref 22–32)
Calcium: 9.4 mg/dL (ref 8.9–10.3)
Chloride: 110 mmol/L (ref 98–111)
Creatinine, Ser: 0.77 mg/dL (ref 0.44–1.00)
GFR, Estimated: >60 mL/min (ref >60)
Glucose, Bld: 95 mg/dL (ref 70–99)
Potassium: 3.9 mmol/L (ref 3.5–5.1)
Sodium: 144 mmol/L (ref 135–145)
Total Bilirubin: 0.5 mg/dL (ref 0.3–1.2)
Total Protein: 6.5 g/dL (ref 6.5–8.1)

## 2020-11-11 LAB — CBC WITH DIFFERENTIAL/PLATELET
Abs Immature Granulocytes: 0 10*3/uL (ref 0.00–0.07)
Basophils Absolute: 0 10*3/uL (ref 0.0–0.1)
Basophils Relative: 1 %
Eosinophils Absolute: 0.2 10*3/uL (ref 0.0–0.5)
Eosinophils Relative: 4 %
HCT: 38.3 % (ref 36.0–46.0)
Hemoglobin: 12.5 g/dL (ref 12.0–15.0)
Immature Granulocytes: 0 %
Lymphocytes Relative: 40 %
Lymphs Abs: 1.6 10*3/uL (ref 0.7–4.0)
MCH: 33.1 pg (ref 26.0–34.0)
MCHC: 32.6 g/dL (ref 30.0–36.0)
MCV: 101.3 fL — ABNORMAL HIGH (ref 80.0–100.0)
Monocytes Absolute: 0.3 10*3/uL (ref 0.1–1.0)
Monocytes Relative: 8 %
Neutro Abs: 1.9 10*3/uL (ref 1.7–7.7)
Neutrophils Relative %: 47 %
Platelets: 186 10*3/uL (ref 150–400)
RBC: 3.78 MIL/uL — ABNORMAL LOW (ref 3.87–5.11)
RDW: 13.6 % (ref 11.5–15.5)
WBC: 4 10*3/uL (ref 4.0–10.5)
nRBC: 0 % (ref 0.0–0.2)

## 2020-11-11 MED ORDER — HEPARIN SOD (PORK) LOCK FLUSH 100 UNIT/ML IV SOLN
500.0000 [IU] | Freq: Once | INTRAVENOUS | Status: AC | PRN
  Administered 2020-11-11: 500 [IU]

## 2020-11-11 MED ORDER — TRASTUZUMAB-ANNS CHEMO 150 MG IV SOLR
6.0000 mg/kg | Freq: Once | INTRAVENOUS | Status: AC
  Administered 2020-11-11: 462 mg via INTRAVENOUS
  Filled 2020-11-11: qty 22

## 2020-11-11 MED ORDER — DIPHENHYDRAMINE HCL 25 MG PO CAPS
25.0000 mg | ORAL_CAPSULE | Freq: Once | ORAL | Status: AC
  Administered 2020-11-11: 25 mg via ORAL
  Filled 2020-11-11: qty 1

## 2020-11-11 MED ORDER — SODIUM CHLORIDE 0.9 % IV SOLN
420.0000 mg | Freq: Once | INTRAVENOUS | Status: AC
  Administered 2020-11-11: 420 mg via INTRAVENOUS
  Filled 2020-11-11: qty 14

## 2020-11-11 MED ORDER — TRASTUZUMAB-DKST CHEMO 150 MG IV SOLR
6.0000 mg/kg | Freq: Once | INTRAVENOUS | Status: DC
  Filled 2020-11-11: qty 22

## 2020-11-11 MED ORDER — ZOLEDRONIC ACID 4 MG/100ML IV SOLN
4.0000 mg | Freq: Once | INTRAVENOUS | Status: AC
  Administered 2020-11-11: 4 mg via INTRAVENOUS
  Filled 2020-11-11: qty 100

## 2020-11-11 MED ORDER — SODIUM CHLORIDE 0.9% FLUSH
10.0000 mL | INTRAVENOUS | Status: AC | PRN
  Administered 2020-11-11: 10 mL

## 2020-11-11 MED ORDER — ACETAMINOPHEN 325 MG PO TABS
650.0000 mg | ORAL_TABLET | Freq: Once | ORAL | Status: AC
  Administered 2020-11-11: 650 mg via ORAL
  Filled 2020-11-11: qty 2

## 2020-11-11 MED ORDER — SODIUM CHLORIDE 0.9 % IV SOLN: Freq: Once | INTRAVENOUS | Status: AC

## 2020-11-11 MED ORDER — SODIUM CHLORIDE 0.9% FLUSH
10.0000 mL | INTRAVENOUS | Status: DC | PRN
  Administered 2020-11-11: 10 mL

## 2020-11-11 NOTE — Patient Instructions (Signed)
[facility] ONCOLOGY  Discharge Instructions: Thank you for choosing [facility] to provide your oncology and hematology care.   If you have a lab appointment with the [facility], please go directly to the [facility] and check in at the registration area.   Wear comfortable clothing and clothing appropriate for easy access to any Portacath or PICC line.   We strive to give you quality time with your provider. You may need to reschedule your appointment if you arrive late (15 or more minutes).  Arriving late affects you and other patients whose appointments are after yours.  Also, if you miss three or more appointments without notifying the office, you may be dismissed from the clinic at the provider's discretion.      For prescription refill requests, have your pharmacy contact our office and allow 72 hours for refills to be completed.    Today you received the following chemotherapy and/or immunotherapy agents Trastuzumab and Perjeta      To help prevent nausea and vomiting after your treatment, we encourage you to take your nausea medication as directed.  BELOW ARE SYMPTOMS THAT SHOULD BE REPORTED IMMEDIATELY: *FEVER GREATER THAN 100.4 F (38 C) OR HIGHER *CHILLS OR SWEATING *NAUSEA AND VOMITING THAT IS NOT CONTROLLED WITH YOUR NAUSEA MEDICATION *UNUSUAL SHORTNESS OF BREATH *UNUSUAL BRUISING OR BLEEDING *URINARY PROBLEMS (pain or burning when urinating, or frequent urination) *BOWEL PROBLEMS (unusual diarrhea, constipation, pain near the anus) TENDERNESS IN MOUTH AND THROAT WITH OR WITHOUT PRESENCE OF ULCERS (sore throat, sores in mouth, or a toothache) UNUSUAL RASH, SWELLING OR PAIN  UNUSUAL VAGINAL DISCHARGE OR ITCHING   Items with * indicate a potential emergency and should be followed up as soon as possible or go to the Emergency Department if any problems should occur.  Please show the CHEMOTHERAPY ALERT CARD or IMMUNOTHERAPY ALERT CARD at  check-in to the Emergency Department and triage nurse.  Should you have questions after your visit or need to cancel or reschedule your appointment, please contact [facility]  Dept: [phone]  and follow the prompts.  Office hours are 8:00 a.m. to 4:30 p.m. Monday - Friday. Please note that voicemails left after 4:00 p.m. may not be returned until the following business day.  We are closed weekends and major holidays. You have access to a nurse at all times for urgent questions. Please call the main number to the clinic Dept: [phone] and follow the prompts.   For any non-urgent questions, you may also contact your provider using MyChart. We now offer e-Visits for anyone 83 and older to request care online for non-urgent symptoms. For details visit [email or web address].   Also download the MyChart app! Go to the app store, search "MyChart", open the app, select [facility], and log in with your MyChart username and password.  Due to Covid, a mask is required upon entering the hospital/clinic. If you do not have a mask, one will be given to you upon arrival. For doctor visits, patients may have 1 support person aged 41 or older with them. For treatment visits, patients cannot have anyone with them due to current Covid guidelines and our immunocompromised population.

## 2020-11-11 NOTE — Progress Notes (Signed)
OK to infuse Pertuzumab and Trastuzumab both over 30 min each. She has gotten these in the past w/o incident.  Kennith Center, Pharm.D., CPP 11/11/2020@10 :40 AM

## 2020-11-12 ENCOUNTER — Ambulatory Visit: Payer: Self-pay | Admitting: General Surgery

## 2020-11-12 DIAGNOSIS — C50511 Malignant neoplasm of lower-outer quadrant of right female breast: Secondary | ICD-10-CM

## 2020-11-13 ENCOUNTER — Telehealth: Payer: Self-pay | Admitting: *Deleted

## 2020-11-13 ENCOUNTER — Ambulatory Visit: Payer: 59

## 2020-11-13 ENCOUNTER — Other Ambulatory Visit: Payer: Self-pay | Admitting: General Surgery

## 2020-11-13 ENCOUNTER — Encounter: Payer: Self-pay | Admitting: *Deleted

## 2020-11-13 DIAGNOSIS — C50511 Malignant neoplasm of lower-outer quadrant of right female breast: Secondary | ICD-10-CM

## 2020-11-13 NOTE — Telephone Encounter (Signed)
Per pt request- scheduled for sx on 11/18

## 2020-12-02 ENCOUNTER — Inpatient Hospital Stay: Payer: 59 | Attending: Oncology

## 2020-12-02 ENCOUNTER — Inpatient Hospital Stay: Payer: 59

## 2020-12-02 ENCOUNTER — Other Ambulatory Visit: Payer: Self-pay

## 2020-12-02 VITALS — BP 123/74 | HR 58 | Temp 98.3°F | Resp 17 | Ht 67.0 in | Wt 172.4 lb

## 2020-12-02 DIAGNOSIS — C7951 Secondary malignant neoplasm of bone: Secondary | ICD-10-CM | POA: Insufficient documentation

## 2020-12-02 DIAGNOSIS — Z170 Estrogen receptor positive status [ER+]: Secondary | ICD-10-CM | POA: Diagnosis not present

## 2020-12-02 DIAGNOSIS — Z5112 Encounter for antineoplastic immunotherapy: Secondary | ICD-10-CM | POA: Diagnosis present

## 2020-12-02 DIAGNOSIS — Z79899 Other long term (current) drug therapy: Secondary | ICD-10-CM | POA: Diagnosis not present

## 2020-12-02 DIAGNOSIS — Z95828 Presence of other vascular implants and grafts: Secondary | ICD-10-CM

## 2020-12-02 DIAGNOSIS — C50511 Malignant neoplasm of lower-outer quadrant of right female breast: Secondary | ICD-10-CM | POA: Insufficient documentation

## 2020-12-02 LAB — COMPREHENSIVE METABOLIC PANEL
ALT: 41 U/L (ref 0–44)
AST: 27 U/L (ref 15–41)
Albumin: 4.2 g/dL (ref 3.5–5.0)
Alkaline Phosphatase: 83 U/L (ref 38–126)
Anion gap: 10 (ref 5–15)
BUN: 13 mg/dL (ref 6–20)
CO2: 25 mmol/L (ref 22–32)
Calcium: 9.4 mg/dL (ref 8.9–10.3)
Chloride: 109 mmol/L (ref 98–111)
Creatinine, Ser: 0.73 mg/dL (ref 0.44–1.00)
GFR, Estimated: >60 mL/min (ref >60)
Glucose, Bld: 91 mg/dL (ref 70–99)
Potassium: 3.7 mmol/L (ref 3.5–5.1)
Sodium: 144 mmol/L (ref 135–145)
Total Bilirubin: 0.4 mg/dL (ref 0.3–1.2)
Total Protein: 7 g/dL (ref 6.5–8.1)

## 2020-12-02 LAB — CBC WITH DIFFERENTIAL/PLATELET
Abs Immature Granulocytes: 0.01 10*3/uL (ref 0.00–0.07)
Basophils Absolute: 0 10*3/uL (ref 0.0–0.1)
Basophils Relative: 1 %
Eosinophils Absolute: 0.1 10*3/uL (ref 0.0–0.5)
Eosinophils Relative: 3 %
HCT: 39.9 % (ref 36.0–46.0)
Hemoglobin: 13.5 g/dL (ref 12.0–15.0)
Immature Granulocytes: 0 %
Lymphocytes Relative: 46 %
Lymphs Abs: 2 10*3/uL (ref 0.7–4.0)
MCH: 32.6 pg (ref 26.0–34.0)
MCHC: 33.8 g/dL (ref 30.0–36.0)
MCV: 96.4 fL (ref 80.0–100.0)
Monocytes Absolute: 0.3 10*3/uL (ref 0.1–1.0)
Monocytes Relative: 6 %
Neutro Abs: 1.9 10*3/uL (ref 1.7–7.7)
Neutrophils Relative %: 44 %
Platelets: 178 10*3/uL (ref 150–400)
RBC: 4.14 MIL/uL (ref 3.87–5.11)
RDW: 11.8 % (ref 11.5–15.5)
WBC: 4.3 10*3/uL (ref 4.0–10.5)
nRBC: 0 % (ref 0.0–0.2)

## 2020-12-02 MED ORDER — HEPARIN SOD (PORK) LOCK FLUSH 100 UNIT/ML IV SOLN
500.0000 [IU] | Freq: Once | INTRAVENOUS | Status: AC | PRN
  Administered 2020-12-02: 500 [IU]

## 2020-12-02 MED ORDER — DIPHENHYDRAMINE HCL 25 MG PO CAPS
25.0000 mg | ORAL_CAPSULE | Freq: Once | ORAL | Status: AC
Start: 2020-12-02 — End: 2020-12-02
  Administered 2020-12-02: 25 mg via ORAL

## 2020-12-02 MED ORDER — ACETAMINOPHEN 325 MG PO TABS
650.0000 mg | ORAL_TABLET | Freq: Once | ORAL | Status: AC
  Administered 2020-12-02: 650 mg via ORAL
  Filled 2020-12-02: qty 2

## 2020-12-02 MED ORDER — DIPHENHYDRAMINE HCL 25 MG PO CAPS
ORAL_CAPSULE | ORAL | Status: AC
  Filled 2020-12-02: qty 1

## 2020-12-02 MED ORDER — SODIUM CHLORIDE 0.9% FLUSH
10.0000 mL | INTRAVENOUS | Status: DC | PRN
  Administered 2020-12-02: 10 mL

## 2020-12-02 MED ORDER — SODIUM CHLORIDE 0.9 % IV SOLN: Freq: Once | INTRAVENOUS | Status: AC

## 2020-12-02 MED ORDER — SODIUM CHLORIDE 0.9% FLUSH
10.0000 mL | Freq: Once | INTRAVENOUS | Status: AC
  Administered 2020-12-02: 10 mL via INTRAVENOUS

## 2020-12-02 MED ORDER — TRASTUZUMAB-ANNS CHEMO 150 MG IV SOLR
6.0000 mg/kg | Freq: Once | INTRAVENOUS | Status: AC
  Administered 2020-12-02: 462 mg via INTRAVENOUS
  Filled 2020-12-02: qty 22

## 2020-12-02 MED ORDER — SODIUM CHLORIDE 0.9 % IV SOLN
420.0000 mg | Freq: Once | INTRAVENOUS | Status: AC
  Administered 2020-12-02: 420 mg via INTRAVENOUS
  Filled 2020-12-02: qty 14

## 2020-12-02 NOTE — Patient Instructions (Signed)
[facility] ONCOLOGY  Discharge Instructions: Thank you for choosing [facility] to provide your oncology and hematology care.   If you have a lab appointment with the [facility], please go directly to the [facility] and check in at the registration area.   Wear comfortable clothing and clothing appropriate for easy access to any Portacath or PICC line.   We strive to give you quality time with your provider. You may need to reschedule your appointment if you arrive late (15 or more minutes).  Arriving late affects you and other patients whose appointments are after yours.  Also, if you miss three or more appointments without notifying the office, you may be dismissed from the clinic at the provider's discretion.      For prescription refill requests, have your pharmacy contact our office and allow 72 hours for refills to be completed.    Today you received the following chemotherapy and/or immunotherapy agents Trastuzumab-anns and Pertuzumab.      To help prevent nausea and vomiting after your treatment, we encourage you to take your nausea medication as directed.  BELOW ARE SYMPTOMS THAT SHOULD BE REPORTED IMMEDIATELY: *FEVER GREATER THAN 100.4 F (38 C) OR HIGHER *CHILLS OR SWEATING *NAUSEA AND VOMITING THAT IS NOT CONTROLLED WITH YOUR NAUSEA MEDICATION *UNUSUAL SHORTNESS OF BREATH *UNUSUAL BRUISING OR BLEEDING *URINARY PROBLEMS (pain or burning when urinating, or frequent urination) *BOWEL PROBLEMS (unusual diarrhea, constipation, pain near the anus) TENDERNESS IN MOUTH AND THROAT WITH OR WITHOUT PRESENCE OF ULCERS (sore throat, sores in mouth, or a toothache) UNUSUAL RASH, SWELLING OR PAIN  UNUSUAL VAGINAL DISCHARGE OR ITCHING   Items with * indicate a potential emergency and should be followed up as soon as possible or go to the Emergency Department if any problems should occur.  Please show the CHEMOTHERAPY ALERT CARD or IMMUNOTHERAPY  ALERT CARD at check-in to the Emergency Department and triage nurse.  Should you have questions after your visit or need to cancel or reschedule your appointment, please contact [facility]  Dept: [phone]  and follow the prompts.  Office hours are 8:00 a.m. to 4:30 p.m. Monday - Friday. Please note that voicemails left after 4:00 p.m. may not be returned until the following business day.  We are closed weekends and major holidays. You have access to a nurse at all times for urgent questions. Please call the main number to the clinic Dept: [phone] and follow the prompts.   For any non-urgent questions, you may also contact your provider using MyChart. We now offer e-Visits for anyone 69 and older to request care online for non-urgent symptoms. For details visit [email or web address].   Also download the MyChart app! Go to the app store, search "MyChart", open the app, select [facility], and log in with your MyChart username and password.  Due to Covid, a mask is required upon entering the hospital/clinic. If you do not have a mask, one will be given to you upon arrival. For doctor visits, patients may have 1 support person aged 62 or older with them. For treatment visits, patients cannot have anyone with them due to current Covid guidelines and our immunocompromised population.

## 2020-12-02 NOTE — Progress Notes (Signed)
30 min post observation performed.  Pt remained WNL and stable at time of discharge.

## 2020-12-09 ENCOUNTER — Encounter: Payer: Self-pay | Admitting: Oncology

## 2020-12-09 NOTE — Telephone Encounter (Signed)
No entry 

## 2020-12-10 ENCOUNTER — Telehealth: Payer: Self-pay

## 2020-12-10 NOTE — Telephone Encounter (Signed)
Returned pt phone call regarding symptoms: sneezing, dry cough, post nasal drip, low grade fever 10/23 only.  Pt stated she was taking tylenol and atrovent nasal spray.  Offered appointment to pt, she denies.  Requested pt call back if symptoms worsen or needs to be seen.  Pt verbalized understanding

## 2020-12-11 ENCOUNTER — Other Ambulatory Visit: Payer: Self-pay

## 2020-12-11 MED ORDER — BENZONATATE 200 MG PO CAPS: 200.0000 mg | ORAL_CAPSULE | Freq: Three times a day (TID) | ORAL | 0 refills | Status: DC | PRN

## 2020-12-11 NOTE — Progress Notes (Signed)
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 Per Wilber Bihari, NP, [facility] called in for pt to preferred phx and advised pt to take Mucinex 12 hr; also advised pt to call if sx worsen. Pt verbalized thanks and understanding.

## 2020-12-21 NOTE — Progress Notes (Signed)
[facility]  Telephone:([phone] Fax:([phone]) [phone]    ID: TRANIKA SCHOLLER DOB: June 07, 1965  MR#: [id number]  ATF#:[id number]  Patient Care Team: [name], MD as PCP - General (Obstetrics and Gynecology) [name], MD as Consulting Physician (Obstetrics and Gynecology) Mauro Kaufmann, RN as Oncology Nurse Navigator Rockwell Germany, RN as Oncology Nurse Navigator Jovita Kussmaul, MD as Consulting Physician (General Surgery) Amal Renbarger, Virgie Dad, MD as Consulting Physician (Oncology) Gery Pray, MD as Consulting Physician (Radiation Oncology) Raina Mina, RPH-CPP (Pharmacist) Larey Dresser, MD as Consulting Physician (Cardiology) Chauncey Cruel, MD OTHER MD:  CHIEF COMPLAINT: Triple positive breast cancer  CURRENT TREATMENT: anti-HER2 immunotherapy; zometa   INTERVAL HISTORY: Kim Archer returns today for follow up and treatment of her triple positive breast cancer.  The interval history is generally unremarkable.  Some days she gets 10,000 steps but many days she is much less active  She continues on Herceptin and Perjeta.  Prior to treatment she used to have a bowel movement every 3 days.  Now she has a "messy" bowel movement most days.  We began Zometa with cycle 2 on 08/19/2020 with a second dose 11/11/2020.  Her next dose will be due in December  She is scheduled for right lumpectomy with no axillary lymph node sampling on 01/02/2021 under Dr. Marlou Starks.   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  She does not have any neuropathy involving the hands.  She also still has some epiphora.  Her hair is coming in not quite as thick as it used to be and certainly much wider.  She is trying some sprays that she thinks may help it grow a little bit more full.  One of her grandchildren had our is to be and she had similar symptoms about 2 weeks ago.  The right big toenail is a little loose but not painful and not inflamed.   A detailed review of systems was otherwise stable.  COVID 19 VACCINATION STATUS: not vaccinated; infection 10/2019   HISTORY OF CURRENT ILLNESS: From the original intake note:  STUTI SANDIN "Kim Archer" [facility]) was previously seen in the high risk breast cancer clinic on 05/11/2016. She had undergone left lumpectomy on 03/22/2016 showing atypical lobular hyperplasia. She was prescribed tamoxifen at that time, which she believes she took for two years. [She underwent hysterectomy with bilateral salpingo-oophorectomy 03/28/2019, pathology showing leiomyomas and adenomyosis, and may have stopped the tamoxifen around that time].  More recently, she had routine screening mammography on 06/11/2020 showing an area of asymmetry in the right breast. She underwent right diagnostic mammography with tomography and right breast ultrasonography at [facility] on 06/20/2020 showing: breast density category C; 2.4 cm right breast mass at 7 o'clock; one abnormal right axillary lymph node measuring 1.5 cm.  Accordingly on 06/25/2020 she proceeded to biopsy of the right breast area in question. The pathology from this procedure ([id number]) showed: invasive mammary carcinoma, e-cadherin positive, grade 2 or 3. Prognostic indicators significant for: estrogen receptor, 40% positive and progesterone receptor, >95% positive, both with strong staining intensity. Proliferation marker Ki67 at 40%. HER2 positive by immunohistochemistry (3+).  Biopsy of the right axillary lymph node performed at the same time showed mammary carcinoma, with no residual lymph node tissue present.  Cancer Staging Malignant neoplasm of lower-outer quadrant of right breast of female, estrogen receptor positive ([place]) Staging form: Breast, AJCC 8th Edition - Clinical stage from 07/02/2020: Stage IV (cT2, cN1, cM1,  G3, ER+, PR+, HER2+) - Signed by Chauncey Cruel, MD on 08/04/2020 Stage prefix: Initial diagnosis Histologic grading system: 3  grade system Laterality: Right Staged by: Pathologist and managing physician Stage used in treatment planning: Yes National guidelines used in treatment planning: Yes Type of national guideline used in treatment planning: NCCN  The patient's subsequent history is as detailed below.   PAST MEDICAL HISTORY: Past Medical History:  Diagnosis Date   Allergy    Anemia    due to heavy periods   Atypical ductal hyperplasia of left breast    History of kidney stones    HSV-2 (herpes simplex virus 2) infection    PONV (postoperative nausea and vomiting)    right breast ca 06/2020   Breast cancer    PAST SURGICAL HISTORY: Past Surgical History:  Procedure Laterality Date   BREAST CYST EXCISION Left 03/22/2016   ALH-BENIGN   BREAST LUMPECTOMY WITH RADIOACTIVE SEED LOCALIZATION Left 03/22/2016   Procedure: LEFT BREAST LUMPECTOMY WITH RADIOACTIVE SEED LOCALIZATION;  Surgeon: Autumn Messing III, MD;  Location: [facility];  Service: General;  Laterality: Left;   CESAREAN SECTION     x3   COMBINED HYSTEROSCOPY DIAGNOSTIC / D&C  07/17/2018   EYE SURGERY     lt lens replacement   PORTACATH PLACEMENT Left 07/07/2020   Procedure: INSERTION PORT-A-CATH;  Surgeon: Jovita Kussmaul, MD;  Location: [facility];  Service: General;  Laterality: Left;   TUBAL LIGATION      FAMILY HISTORY: Family History  Problem Relation Age of Onset   Breast cancer Mother    Melanoma Father    Leukemia Sister    Liver cancer Brother        stomach   Breast cancer Maternal Aunt    Colon cancer Neg Hx    Colon polyps Neg Hx    Esophageal cancer Neg Hx    Rectal cancer Neg Hx    Stomach cancer Neg Hx   The patient's father died at age 18. He had a history of melanoma. He has a brother, the patient's uncle, with prostate cancer. The patient's mother died in her 52s from urosepsis. She had a history of breast cancer diagnosed in her 50s. The patient has a brother, with cerebral palsy, who had "stomach cancer"  metastatic to the liver. The patient has 2 sisters. One of them died from childhood leukemia.    GYNECOLOGIC HISTORY:  No LMP recorded (lmp unknown). Patient has had a hysterectomy. Menarche: 55 years old Age at first live birth: 55 years old [place] P 3 LMP 03/2019, with hysterectomy Contraceptive: used remotely for many years with no complications HRT never used  Hysterectomy? Yes, 03/2019 (path in Epic) BSO? 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 Her husband [name] (goes by "[name]") is Airline pilot for [employer], which manages apartment complexes. The patient's daughter Kearney Hard, age 57, works as a Theme park manager in [place]. She has 3 children. She, her husband, and their three children are living with [place] and [place]. The patient's daughter Moody Bruins, age 72, lives in [place]. Kim Archer has legal custody of one of Elina's children. The patient's third child, Quillian Quince "Dalbert Mayotte, 56 years old, also lives in [place] and works in Architect. Patient has in total 8 grandchildren. She is a Psychologist, forensic.    ADVANCED DIRECTIVES: In the absence of any documentation to the contrary, the patient's spouse is their  HCPOA.    HEALTH MAINTENANCE: Social History   Tobacco Use   Smoking status: Never   Smokeless tobacco: Never  Vaping Use   Vaping Use: Never used  Substance Use Topics   Alcohol use: No   Drug use: No     Colonoscopy: 08/2018 (Dr. Ardis Hughs), recall 2030  PAP: 05/2020  Bone density: 05/2019 (Dr. Christen Butter office)   Allergies  Allergen Reactions   Ciprofloxacin Other (See Comments)    Patient came into office with complaints of difficulty swallowing.  Ate at [employer] prior and took Cipro prior   Sulfa Antibiotics Rash    Current Outpatient Medications  Medication Sig Dispense Refill   gabapentin (NEURONTIN) 100 MG capsule Take 1 capsule (100 mg total) by  mouth at bedtime. 90 capsule 4   benzonatate (TESSALON) 200 MG capsule Take 1 capsule (200 mg total) by mouth 3 (three) times daily as needed for cough. 20 capsule 0   Boron 3 MG CAPS Take by mouth.     Calcium 500 MG tablet 1,000 mg.     loratadine (CLARITIN) 10 MG tablet Take 10 mg by mouth daily.     LUTEIN PO Take by mouth.     MAGNESIUM PO Take 1 tablet by mouth at bedtime.     Menatetrenone (VITAMIN K2) 100 MCG TABS Take by mouth.     Multiple Vitamin (MULTIVITAMIN WITH MINERALS) TABS tablet Take 1 tablet by mouth daily.     tobramycin-dexamethasone (TOBRADEX) ophthalmic solution Place 1 drop into both eyes in the morning and at bedtime. 5 mL 0   Ubiquinol 50 MG CAPS Take by mouth daily.     valACYclovir (VALTREX) 500 MG tablet  (Patient not taking: Reported on 07/24/2020)  12   VITAMIN D PO Vitamin D     zinc sulfate 220 (50 Zn) MG capsule Take 220 mg by mouth daily.     No current facility-administered medications for this visit.    OBJECTIVE: White woman who appears stated age  40:   12/22/20 0830  BP: 108/71  Pulse: 63  Resp: 16  Temp: (!) 97.5 F (36.4 C)  SpO2: 97%       Body mass index is 27.14 kg/m.   Wt Readings from Last 3 Encounters:  12/22/20 173 lb 4.8 oz (78.6 kg)  12/02/20 172 lb 6.4 oz (78.2 kg)  11/11/20 174 lb 1.6 oz (79 kg)     ECOG FS:1 - Symptomatic but completely ambulatory  Sclerae unicteric, EOMs intact Wearing a mask No cervical or supraclavicular adenopathy Lungs no rales or rhonchi Heart regular rate and rhythm Abd soft, nontender, positive bowel sounds MSK no focal spinal tenderness, no upper extremity lymphedema Neuro: nonfocal, well oriented, appropriate affect Breasts: The right breast is slightly fuller than the left.  There is no palpable mass.  There are no skin or nipple changes of concern.  The left breast is status post remote lumpectomy.  There is no evidence of local recurrence.  Both axillae are benign.   LAB  RESULTS:  CMP     Component Value Date/Time   NA 144 12/02/2020 0751   NA 142 05/11/2016 1508   K 3.7 12/02/2020 0751   K 4.3 05/11/2016 1508   CL 109 12/02/2020 0751   CO2 25 12/02/2020 0751   CO2 26 05/11/2016 1508   GLUCOSE 91 12/02/2020 0751   GLUCOSE 90 05/11/2016 1508   BUN 13 12/02/2020 0751   BUN 10.9 05/11/2016 1508   CREATININE 0.73  12/02/2020 0751   CREATININE 0.77 08/04/2020 1540   CREATININE 1.0 05/11/2016 1508   CALCIUM 9.4 12/02/2020 0751   CALCIUM 9.5 05/11/2016 1508   PROT 7.0 12/02/2020 0751   PROT 7.0 05/11/2016 1508   ALBUMIN 4.2 12/02/2020 0751   ALBUMIN 3.9 05/11/2016 1508   AST 27 12/02/2020 0751   AST 29 08/04/2020 1540   AST 19 05/11/2016 1508   ALT 41 12/02/2020 0751   ALT 28 08/04/2020 1540   ALT 23 05/11/2016 1508   ALKPHOS 83 12/02/2020 0751   ALKPHOS 98 05/11/2016 1508   BILITOT 0.4 12/02/2020 0751   BILITOT 0.3 08/04/2020 1540   BILITOT 0.36 05/11/2016 1508   GFRNONAA >60 12/02/2020 0751   GFRNONAA >60 08/04/2020 1540    No results found for: TOTALPROTELP, ALBUMINELP, A1GS, A2GS, BETS, BETA2SER, GAMS, MSPIKE, SPEI  Lab Results  Component Value Date   WBC 4.8 12/22/2020   NEUTROABS 2.5 12/22/2020   HGB 13.0 12/22/2020   HCT 40.0 12/22/2020   MCV 97.1 12/22/2020   PLT 198 12/22/2020    No results found for: LABCA2  No components found for: UXLKGM010  No results for input(s): INR in the last 168 hours.  No results found for: LABCA2  No results found for: UVO536  No results found for: UYQ034  No results found for: VQQ595  Lab Results  Component Value Date   CA2729 37.8 08/21/2020    No components found for: HGQUANT  No results found for: CEA1 / No results found for: CEA1   No results found for: AFPTUMOR  No results found for: CHROMOGRNA  No results found for: KPAFRELGTCHN, LAMBDASER, KAPLAMBRATIO (kappa/lambda light chains)  No results found for: HGBA, HGBA2QUANT, HGBFQUANT, HGBSQUAN (Hemoglobinopathy  evaluation)   No results found for: LDH  No results found for: IRON, TIBC, IRONPCTSAT (Iron and TIBC)  No results found for: FERRITIN  Urinalysis    Component Value Date/Time   COLORURINE AMBER (A) 10/03/2020 1003   APPEARANCEUR CLEAR 10/03/2020 1003   LABSPEC 1.012 10/03/2020 1003   PHURINE 6.0 10/03/2020 1003   GLUCOSEU NEGATIVE 10/03/2020 1003   HGBUR LARGE (A) 10/03/2020 1003   BILIRUBINUR NEGATIVE 10/03/2020 1003   KETONESUR NEGATIVE 10/03/2020 1003   PROTEINUR NEGATIVE 10/03/2020 1003   NITRITE POSITIVE (A) 10/03/2020 1003   LEUKOCYTESUR LARGE (A) 10/03/2020 1003    STUDIES: No results found.    ELIGIBLE FOR AVAILABLE RESEARCH PROTOCOL: No  ASSESSMENT: 55 y.o. [place], [place] woman  LEFT BREAST ATYPICAL LOBULAR HYPERPLASIA (1) status post left lumpectomy 03/22/2016 showing atypical lobular hyperplasia  (2) prophylactic tamoxifen taken from 04/2016 through 03/2019  (a) status post hysterectomy (without bilateral salpingo-oophorectomy) February 2021  (3) genetic testing 05/22/2019 through the my risk hereditary cancer panel offered by [facility] found no deleterious mutations in BRCA, TP53, PALB2 or any of the other genes tested  (a) variants of uncertain significance were found in BRIP1 and NBN  RIGHT BREAST CANCER: METASTATIC DISEASE June 2022 (4) right breast lower outer quadrant biopsy 06/25/2020 shows a clinical T2 N1, stage IB invasive ductal carcinoma, grade 2 or 3, triple positive, with an MIB-1 of 40%.  (A) breast MRI 07/07/2020 shows mcT2 N1 disease as well as an enhancing lesion in the sternum (B) non-contrast Chest CT scan 07/23/2020 shows right axillary and retropectoral adenopathy, also several liver hypodensities and very small lytic/sclerotic bone lesions  (C) bone scan 07/25/2020 shows abnormal uptake at T8, T10, midline lower cervical spine, superior LEFT manubrium and posterior LEFT ninth rib  (  D) abdominal MRI with and w/o conttrast 07/26/2020 shows a  right hepatic 1.2 cm indeterminate lesion, no other lesions of concern in the liver; bone lesions re-demonstrated, largetst at T10  (E) PET scan 08/01/2020 shows no hypermetabolism in the 1.2 cm right liver lesion; bony hypermetabolism consistent with known mets noted  (F) biopsy T10 to be performed at the end of chemotherapy  (G) brain MRI 08/12/2020 negative (H) CA 27-29 on 08/19/2020 NONINFORMATIVE  (5) neoadjuvant chemotherapy consisting of docetaxel, carboplatin, trastuzumab and pertuzumab every 21 days x 4 cycles started 07/28/2020, last chemotherapy 09/30/2020  (6) anti-HER2 treatment to be continued indefinitely  (a) echocardiogram on 07/03/2020 shows EF of 55-60%  (b) echo 10/07/2020 shows an ejection fraction in the 60-65% range.  (7) no adjuvant radiation planned (discussed 10/22/2020 conference)  (8) zoledronate started 08/19/2020, to be repeated every 12 weeks  (9) restaging studies:  (A) MRI of the brain with and without contrast 08/12/2020 benign  (B) total spinal MRI 10/07/2020 shows stable to improved disease, no extraosseous extension, pathologic fracture, epidural or intracanalicular involvement  (C) MRI of the breast 10/19/2020 shows a complete imaging response in the breast, with new ring-enhancing lesions in the left breast with MRI guided biopsy 11/10/2020 showing no evidence of malignancy  (10) right lumpectomy with no sentinel lymph node sampling pending  (11) anastrozole +/- abemaciclib to start after surgery  (A) bone density 05/22/2019 showed a T score of -1.4   PLAN: Kim Archer is doing generally well with her current anti-HER2 treatment.  The plan is to continue the Perjeta for 1 year but the trastuzumab indefinitely so long as there is no significant toxicity and no evidence of disease progression.  She is scheduled for lumpectomy and our expectation is that she will have had a complete pathologic response.  We are not going to do any sentinel lymph node  sampling.  I do not anticipate the need for adjuvant radiation.  When she returns to see me we will discuss anastrozole.  Today I wrote for her to take gabapentin at bedtime 100 mg and if she tolerates it well she can try during the day.  Hopefully this will help a little bit with the neuropathy symptoms.  Total encounter time 25 minutes.Sarajane Jews C. Glendoris Nodarse, MD 12/22/20 8:54 AM Medical Oncology and Hematology [facility] [employer], [place] [address] Tel. [phone]    Fax. [phone]   I, Wilburn Mylar, am acting as scribe for Dr. Virgie Dad. Aries Townley.  I, Lurline Del MD, have reviewed the above documentation for accuracy and completeness, and I agree with the above.   *Total Encounter Time as defined by the Centers for Medicare and Medicaid Services includes, in addition to the face-to-face time of a patient visit (documented in the note above) non-face-to-face time: obtaining and reviewing outside history, ordering and reviewing medications, tests or procedures, care coordination (communications with other health care professionals or caregivers) and documentation in the medical record.

## 2020-12-22 ENCOUNTER — Ambulatory Visit: Payer: 59

## 2020-12-22 ENCOUNTER — Encounter: Payer: Self-pay | Admitting: *Deleted

## 2020-12-22 ENCOUNTER — Inpatient Hospital Stay: Payer: 59 | Attending: Oncology

## 2020-12-22 ENCOUNTER — Other Ambulatory Visit: Payer: Self-pay

## 2020-12-22 ENCOUNTER — Inpatient Hospital Stay: Payer: 59

## 2020-12-22 ENCOUNTER — Inpatient Hospital Stay (HOSPITAL_BASED_OUTPATIENT_CLINIC_OR_DEPARTMENT_OTHER): Payer: 59 | Admitting: Oncology

## 2020-12-22 VITALS — BP 108/71 | HR 63 | Temp 97.5°F | Resp 16 | Ht 67.0 in | Wt 173.3 lb

## 2020-12-22 DIAGNOSIS — Z170 Estrogen receptor positive status [ER+]: Secondary | ICD-10-CM

## 2020-12-22 DIAGNOSIS — Z5112 Encounter for antineoplastic immunotherapy: Secondary | ICD-10-CM | POA: Diagnosis present

## 2020-12-22 DIAGNOSIS — C50511 Malignant neoplasm of lower-outer quadrant of right female breast: Secondary | ICD-10-CM | POA: Insufficient documentation

## 2020-12-22 DIAGNOSIS — Z7189 Other specified counseling: Secondary | ICD-10-CM

## 2020-12-22 DIAGNOSIS — Z79899 Other long term (current) drug therapy: Secondary | ICD-10-CM | POA: Diagnosis not present

## 2020-12-22 DIAGNOSIS — C773 Secondary and unspecified malignant neoplasm of axilla and upper limb lymph nodes: Secondary | ICD-10-CM | POA: Insufficient documentation

## 2020-12-22 DIAGNOSIS — C7951 Secondary malignant neoplasm of bone: Secondary | ICD-10-CM

## 2020-12-22 DIAGNOSIS — Z95828 Presence of other vascular implants and grafts: Secondary | ICD-10-CM

## 2020-12-22 LAB — COMPREHENSIVE METABOLIC PANEL
ALT: 37 U/L (ref 0–44)
AST: 25 U/L (ref 15–41)
Albumin: 3.9 g/dL (ref 3.5–5.0)
Alkaline Phosphatase: 87 U/L (ref 38–126)
Anion gap: 9 (ref 5–15)
BUN: 14 mg/dL (ref 6–20)
CO2: 24 mmol/L (ref 22–32)
Calcium: 9.1 mg/dL (ref 8.9–10.3)
Chloride: 111 mmol/L (ref 98–111)
Creatinine, Ser: 0.79 mg/dL (ref 0.44–1.00)
GFR, Estimated: >60 mL/min (ref >60)
Glucose, Bld: 104 mg/dL — ABNORMAL HIGH (ref 70–99)
Potassium: 3.8 mmol/L (ref 3.5–5.1)
Sodium: 144 mmol/L (ref 135–145)
Total Bilirubin: 0.2 mg/dL — ABNORMAL LOW (ref 0.3–1.2)
Total Protein: 6.6 g/dL (ref 6.5–8.1)

## 2020-12-22 LAB — CBC WITH DIFFERENTIAL/PLATELET
Abs Immature Granulocytes: 0.01 10*3/uL (ref 0.00–0.07)
Basophils Absolute: 0 10*3/uL (ref 0.0–0.1)
Basophils Relative: 0 %
Eosinophils Absolute: 0.1 10*3/uL (ref 0.0–0.5)
Eosinophils Relative: 2 %
HCT: 40 % (ref 36.0–46.0)
Hemoglobin: 13 g/dL (ref 12.0–15.0)
Immature Granulocytes: 0 %
Lymphocytes Relative: 39 %
Lymphs Abs: 1.9 10*3/uL (ref 0.7–4.0)
MCH: 31.6 pg (ref 26.0–34.0)
MCHC: 32.5 g/dL (ref 30.0–36.0)
MCV: 97.1 fL (ref 80.0–100.0)
Monocytes Absolute: 0.3 10*3/uL (ref 0.1–1.0)
Monocytes Relative: 6 %
Neutro Abs: 2.5 10*3/uL (ref 1.7–7.7)
Neutrophils Relative %: 53 %
Platelets: 198 10*3/uL (ref 150–400)
RBC: 4.12 MIL/uL (ref 3.87–5.11)
RDW: 11.6 % (ref 11.5–15.5)
WBC: 4.8 10*3/uL (ref 4.0–10.5)
nRBC: 0 % (ref 0.0–0.2)

## 2020-12-22 MED ORDER — HEPARIN SOD (PORK) LOCK FLUSH 100 UNIT/ML IV SOLN
500.0000 [IU] | Freq: Once | INTRAVENOUS | Status: AC | PRN
  Administered 2020-12-22: 500 [IU]

## 2020-12-22 MED ORDER — SODIUM CHLORIDE 0.9% FLUSH
10.0000 mL | INTRAVENOUS | Status: AC | PRN
  Administered 2020-12-22: 10 mL

## 2020-12-22 MED ORDER — ACETAMINOPHEN 325 MG PO TABS
650.0000 mg | ORAL_TABLET | Freq: Once | ORAL | Status: AC
  Administered 2020-12-22: 650 mg via ORAL
  Filled 2020-12-22: qty 2

## 2020-12-22 MED ORDER — SODIUM CHLORIDE 0.9% FLUSH
10.0000 mL | INTRAVENOUS | Status: DC | PRN
  Administered 2020-12-22: 10 mL

## 2020-12-22 MED ORDER — DIPHENHYDRAMINE HCL 25 MG PO CAPS
25.0000 mg | ORAL_CAPSULE | Freq: Once | ORAL | Status: AC
  Administered 2020-12-22: 25 mg via ORAL
  Filled 2020-12-22: qty 1

## 2020-12-22 MED ORDER — TRASTUZUMAB-ANNS CHEMO 150 MG IV SOLR
6.0000 mg/kg | Freq: Once | INTRAVENOUS | Status: AC
  Administered 2020-12-22: 462 mg via INTRAVENOUS
  Filled 2020-12-22: qty 22

## 2020-12-22 MED ORDER — SODIUM CHLORIDE 0.9 % IV SOLN: Freq: Once | INTRAVENOUS | Status: AC

## 2020-12-22 MED ORDER — GABAPENTIN 100 MG PO CAPS: 100.0000 mg | ORAL_CAPSULE | Freq: Every day | ORAL | 4 refills | Status: DC

## 2020-12-22 MED ORDER — SODIUM CHLORIDE 0.9 % IV SOLN
420.0000 mg | Freq: Once | INTRAVENOUS | Status: AC
  Administered 2020-12-22: 420 mg via INTRAVENOUS
  Filled 2020-12-22: qty 14

## 2020-12-22 NOTE — Patient Instructions (Signed)
[facility]   Discharge Instructions: Thank you for choosing [facility] to provide your oncology and hematology care.   If you have a lab appointment with the [facility], please go directly to the [facility] and check in at the registration area.   Wear comfortable clothing and clothing appropriate for easy access to any Portacath or PICC line.   We strive to give you quality time with your provider. You may need to reschedule your appointment if you arrive late (15 or more minutes).  Arriving late affects you and other patients whose appointments are after yours.  Also, if you miss three or more appointments without notifying the office, you may be dismissed from the clinic at the provider's discretion.      For prescription refill requests, have your pharmacy contact our office and allow 72 hours for refills to be completed.    Today you received the following chemotherapy and/or immunotherapy agents: trastuzumab-anns and pertuzumab.      To help prevent nausea and vomiting after your treatment, we encourage you to take your nausea medication as directed.  BELOW ARE SYMPTOMS THAT SHOULD BE REPORTED IMMEDIATELY: *FEVER GREATER THAN 100.4 F (38 C) OR HIGHER *CHILLS OR SWEATING *NAUSEA AND VOMITING THAT IS NOT CONTROLLED WITH YOUR NAUSEA MEDICATION *UNUSUAL SHORTNESS OF BREATH *UNUSUAL BRUISING OR BLEEDING *URINARY PROBLEMS (pain or burning when urinating, or frequent urination) *BOWEL PROBLEMS (unusual diarrhea, constipation, pain near the anus) TENDERNESS IN MOUTH AND THROAT WITH OR WITHOUT PRESENCE OF ULCERS (sore throat, sores in mouth, or a toothache) UNUSUAL RASH, SWELLING OR PAIN  UNUSUAL VAGINAL DISCHARGE OR ITCHING   Items with * indicate a potential emergency and should be followed up as soon as possible or go to the Emergency Department if any problems should occur.  Please show the CHEMOTHERAPY ALERT CARD or IMMUNOTHERAPY  ALERT CARD at check-in to the Emergency Department and triage nurse.  Should you have questions after your visit or need to cancel or reschedule your appointment, please contact [facility]  Dept: [phone]  and follow the prompts.  Office hours are 8:00 a.m. to 4:30 p.m. Monday - Friday. Please note that voicemails left after 4:00 p.m. may not be returned until the following business day.  We are closed weekends and major holidays. You have access to a nurse at all times for urgent questions. Please call the main number to the clinic Dept: [phone] and follow the prompts.   For any non-urgent questions, you may also contact your provider using MyChart. We now offer e-Visits for anyone 83 and older to request care online for non-urgent symptoms. For details visit [email or web address].   Also download the MyChart app! Go to the app store, search "MyChart", open the app, select [facility], and log in with your MyChart username and password.  Due to Covid, a mask is required upon entering the hospital/clinic. If you do not have a mask, one will be given to you upon arrival. For doctor visits, patients may have 1 support person aged 58 or older with them. For treatment visits, patients cannot have anyone with them due to current Covid guidelines and our immunocompromised population.

## 2020-12-29 ENCOUNTER — Encounter (HOSPITAL_BASED_OUTPATIENT_CLINIC_OR_DEPARTMENT_OTHER): Payer: Self-pay | Admitting: General Surgery

## 2020-12-29 ENCOUNTER — Other Ambulatory Visit: Payer: Self-pay

## 2020-12-29 NOTE — Progress Notes (Signed)

## 2020-12-30 ENCOUNTER — Other Ambulatory Visit: Payer: Self-pay | Admitting: Oncology

## 2020-12-31 ENCOUNTER — Encounter: Payer: Self-pay | Admitting: Oncology

## 2021-01-01 ENCOUNTER — Ambulatory Visit
Admission: RE | Admit: 2021-01-01 | Discharge: 2021-01-01 | Disposition: A | Discharge status: Home / Self Care | Payer: 59 | Source: Ambulatory Visit | Attending: General Surgery | Admitting: General Surgery

## 2021-01-01 ENCOUNTER — Other Ambulatory Visit: Payer: Self-pay

## 2021-01-01 DIAGNOSIS — Z170 Estrogen receptor positive status [ER+]: Secondary | ICD-10-CM

## 2021-01-01 DIAGNOSIS — C50511 Malignant neoplasm of lower-outer quadrant of right female breast: Secondary | ICD-10-CM

## 2021-01-01 IMAGING — MG MM PLC BREAST LOC DEV 1ST LESION INC*R*
8 of 11 series · 8 of 11 positions shown · non-contrast
Comparison: Previous exam(s).

CLINICAL DATA: 55-year-old female with history of invasive mammary
carcinoma of the right breast x 2. Patient presents for seed
localization.

EXAM:
MAMMOGRAPHIC GUIDED RADIOACTIVE SEED LOCALIZATION OF THE RIGHT
BREAST

[R CC (1 of 5)]
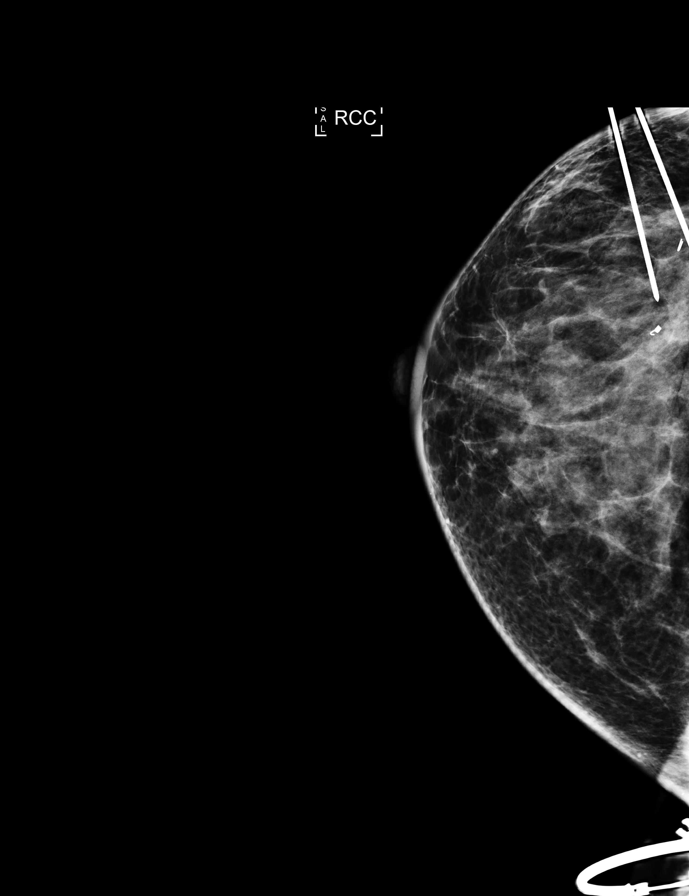

[R LM (1 of 3)]
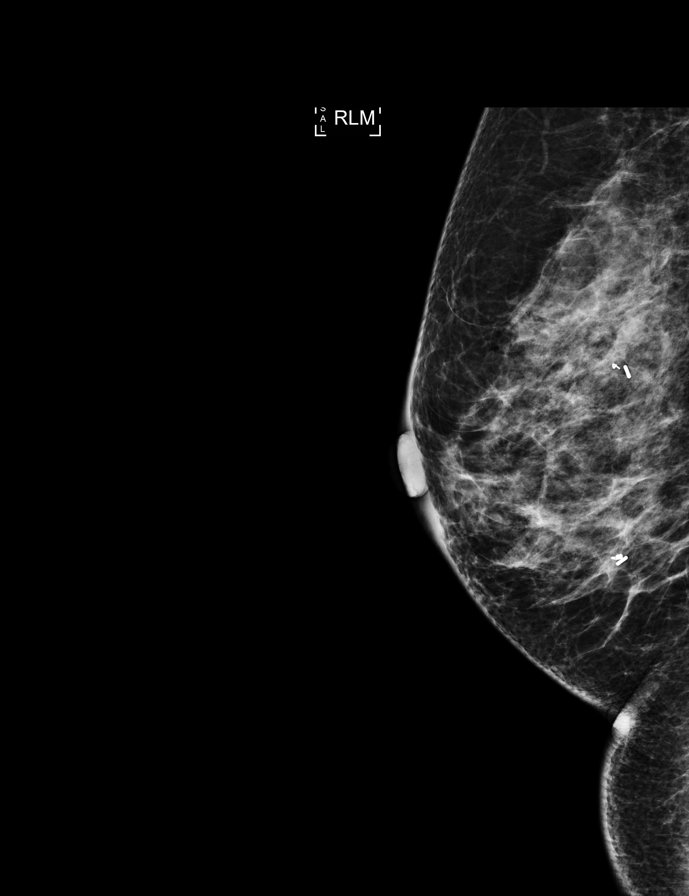

[R LM (2 of 3)]
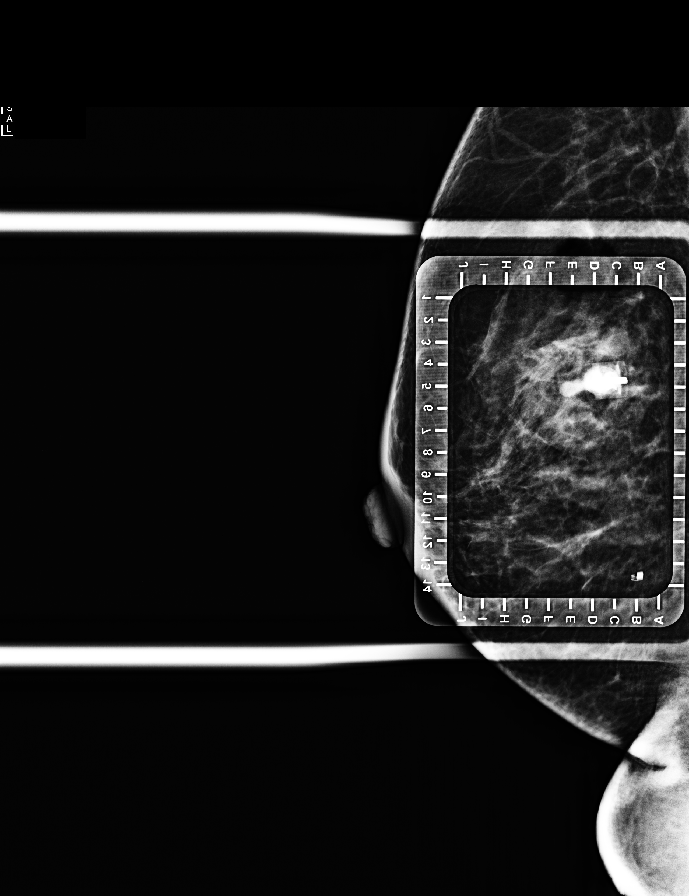

[R CC (2 of 5)]
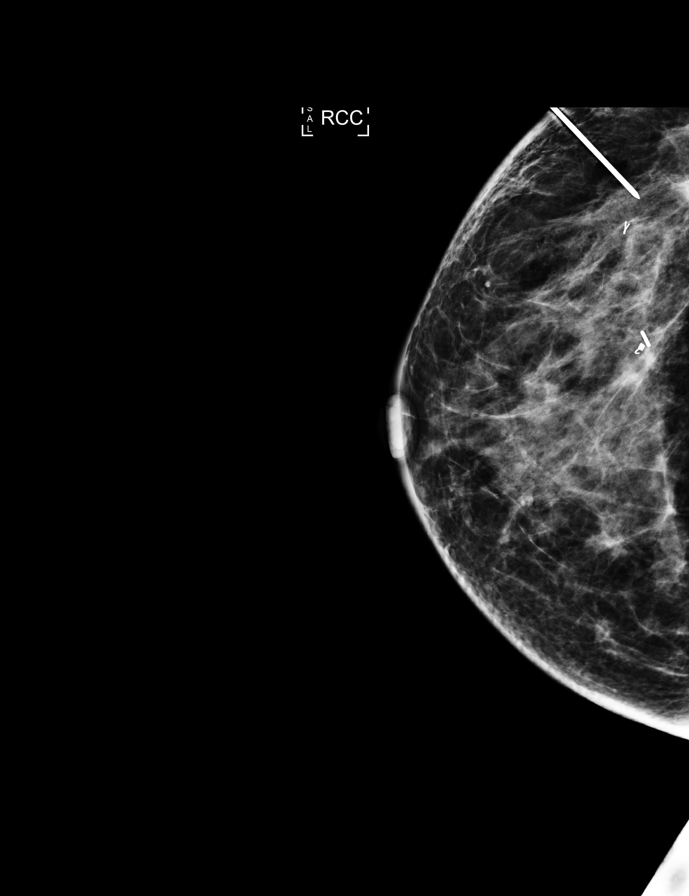

[R CC (3 of 5)]
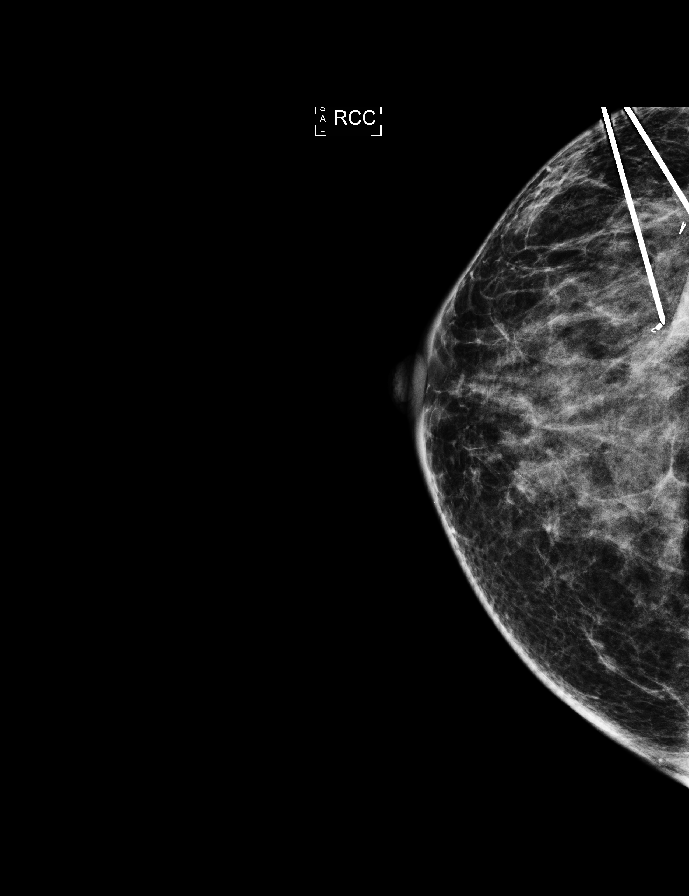

[R LM (3 of 3)]
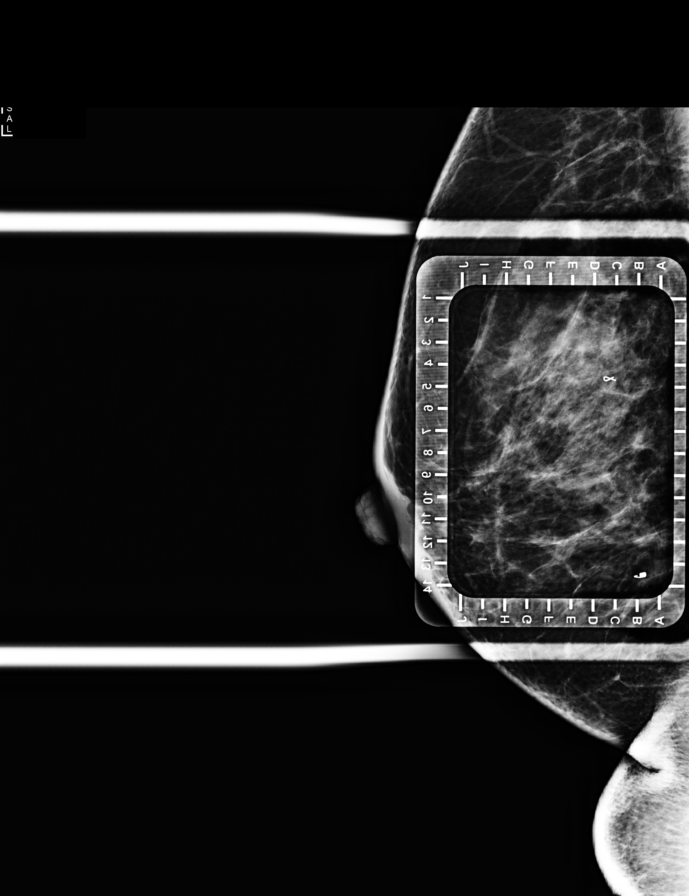

[R CC (4 of 5)]
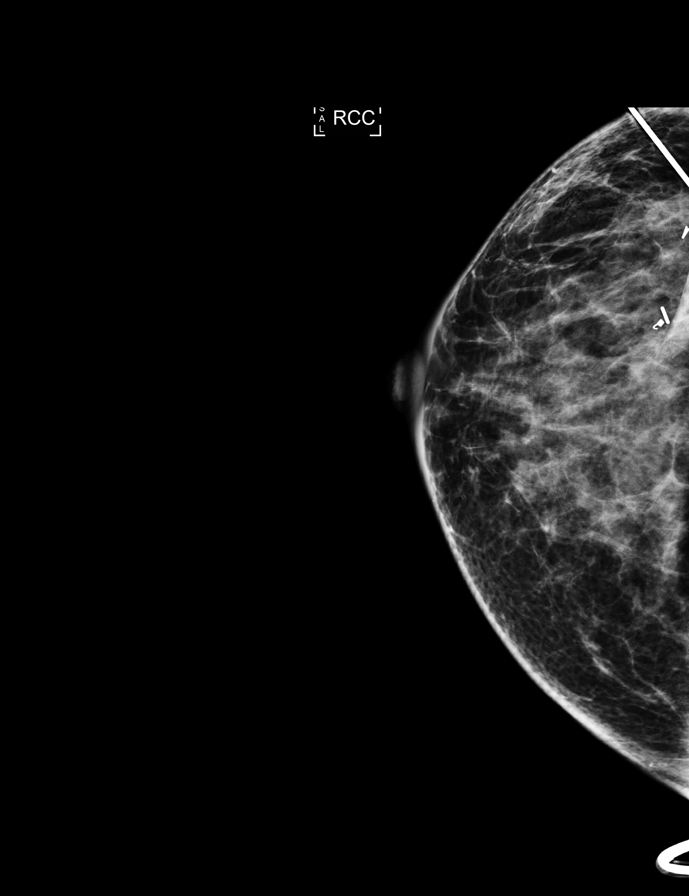

[R CC (5 of 5)]
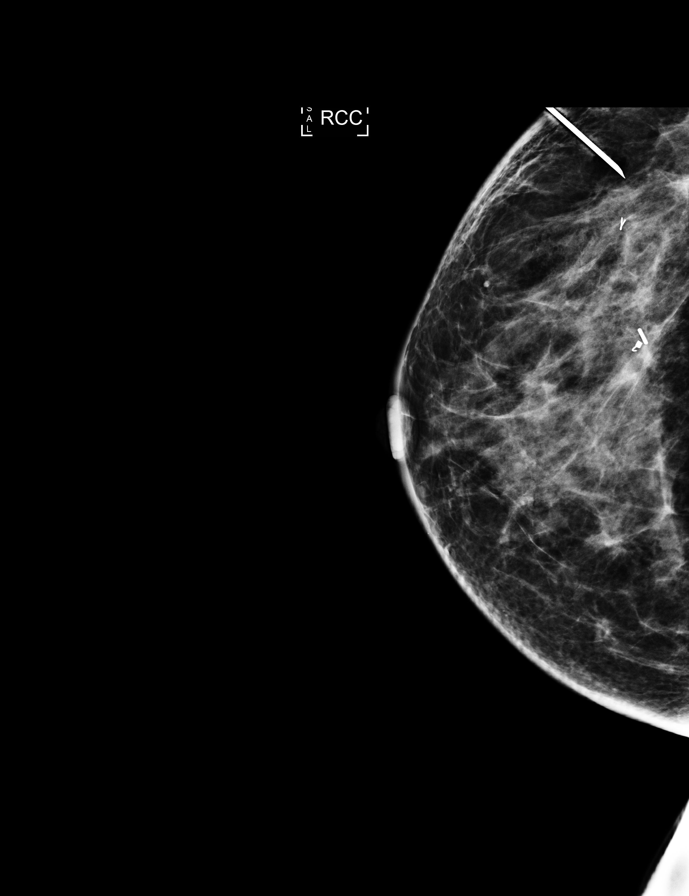

[8 of 11 positions shown; findings below may reference images not displayed]

FINDINGS: Patient presents for radioactive seed localization prior to . I met
with the patient and we discussed the procedure of seed localization
including benefits and alternatives. We discussed the high
likelihood of a successful procedure. We discussed the risks of the
procedure including infection, bleeding, tissue injury and further
surgery. We discussed the low dose of radioactivity involved in the
procedure. Informed, written consent was given.

The usual time-out protocol was performed immediately prior to the
procedure.

1. Using mammographic guidance, sterile technique, 1% lidocaine and
an [GJ] radioactive seed, the coil biopsy marking clip was
localized using a lateral approach. The follow-up mammogram images
confirm the seed in the expected location and were marked for Dr.
AUNTYJATTY.

Follow-up survey of the patient confirms presence of the radioactive
seed.

Order number of [GJ] seed:  [PHONE_NUMBER].

Total activity: 0.261 mCi reference Date: [DATE] [DATE] [DATE]

[DATE]. Using mammographic guidance, sterile technique, 1% lidocaine and
an [GJ] radioactive seed, the ribbon biopsy marking clip was
localized using a lateral approach. The follow-up mammogram images
confirm the seed in the expected location and were marked for Dr.
AUNTYJATTY.

Follow-up survey of the patient confirms presence of the radioactive
seed.

Order number of [GJ] seed:  [PHONE_NUMBER].

Total activity: 0.260 mCi reference Date: [DATE]

The patient tolerated the procedure well and was released from the
[REDACTED]. She was given instructions regarding seed removal.
IMPRESSION: Radioactive seed localization right breast. No apparent
complications.

## 2021-01-02 ENCOUNTER — Ambulatory Visit (HOSPITAL_BASED_OUTPATIENT_CLINIC_OR_DEPARTMENT_OTHER): Payer: 59 | Admitting: Certified Registered"

## 2021-01-02 ENCOUNTER — Encounter (HOSPITAL_BASED_OUTPATIENT_CLINIC_OR_DEPARTMENT_OTHER)
Admission: RE | Disposition: A | Discharge status: Home / Self Care | Payer: Self-pay | Source: Ambulatory Visit | Attending: General Surgery

## 2021-01-02 ENCOUNTER — Ambulatory Visit
Admission: RE | Admit: 2021-01-02 | Discharge: 2021-01-02 | Disposition: A | Discharge status: Home / Self Care | Payer: 59 | Source: Ambulatory Visit | Attending: General Surgery | Admitting: General Surgery

## 2021-01-02 ENCOUNTER — Encounter (HOSPITAL_BASED_OUTPATIENT_CLINIC_OR_DEPARTMENT_OTHER): Payer: Self-pay | Admitting: General Surgery

## 2021-01-02 ENCOUNTER — Ambulatory Visit (HOSPITAL_BASED_OUTPATIENT_CLINIC_OR_DEPARTMENT_OTHER)
Admission: RE | Admit: 2021-01-02 | Discharge: 2021-01-02 | Disposition: A | Discharge status: Home / Self Care | Payer: 59 | Source: Ambulatory Visit | Attending: General Surgery | Admitting: General Surgery

## 2021-01-02 ENCOUNTER — Other Ambulatory Visit: Payer: Self-pay

## 2021-01-02 DIAGNOSIS — Z170 Estrogen receptor positive status [ER+]: Secondary | ICD-10-CM | POA: Diagnosis not present

## 2021-01-02 DIAGNOSIS — Z5111 Encounter for antineoplastic chemotherapy: Secondary | ICD-10-CM | POA: Diagnosis not present

## 2021-01-02 DIAGNOSIS — C50511 Malignant neoplasm of lower-outer quadrant of right female breast: Secondary | ICD-10-CM | POA: Diagnosis present

## 2021-01-02 DIAGNOSIS — C7951 Secondary malignant neoplasm of bone: Secondary | ICD-10-CM | POA: Insufficient documentation

## 2021-01-02 HISTORY — PX: BREAST LUMPECTOMY WITH RADIOACTIVE SEED LOCALIZATION: SHX6424

## 2021-01-02 IMAGING — MG MM BREAST SURGICAL SPECIMEN
1 series · 2 of 2 positions shown · non-contrast
Comparison: Previous exam(s).

CLINICAL DATA: Status post 2 site seed localization of the RIGHT
breast.

EXAM:
SPECIMEN RADIOGRAPH OF THE RIGHT BREAST

[Series 1: R · right · 0.07mm/px · 2 of 2 slices shown]
[im 1/2]
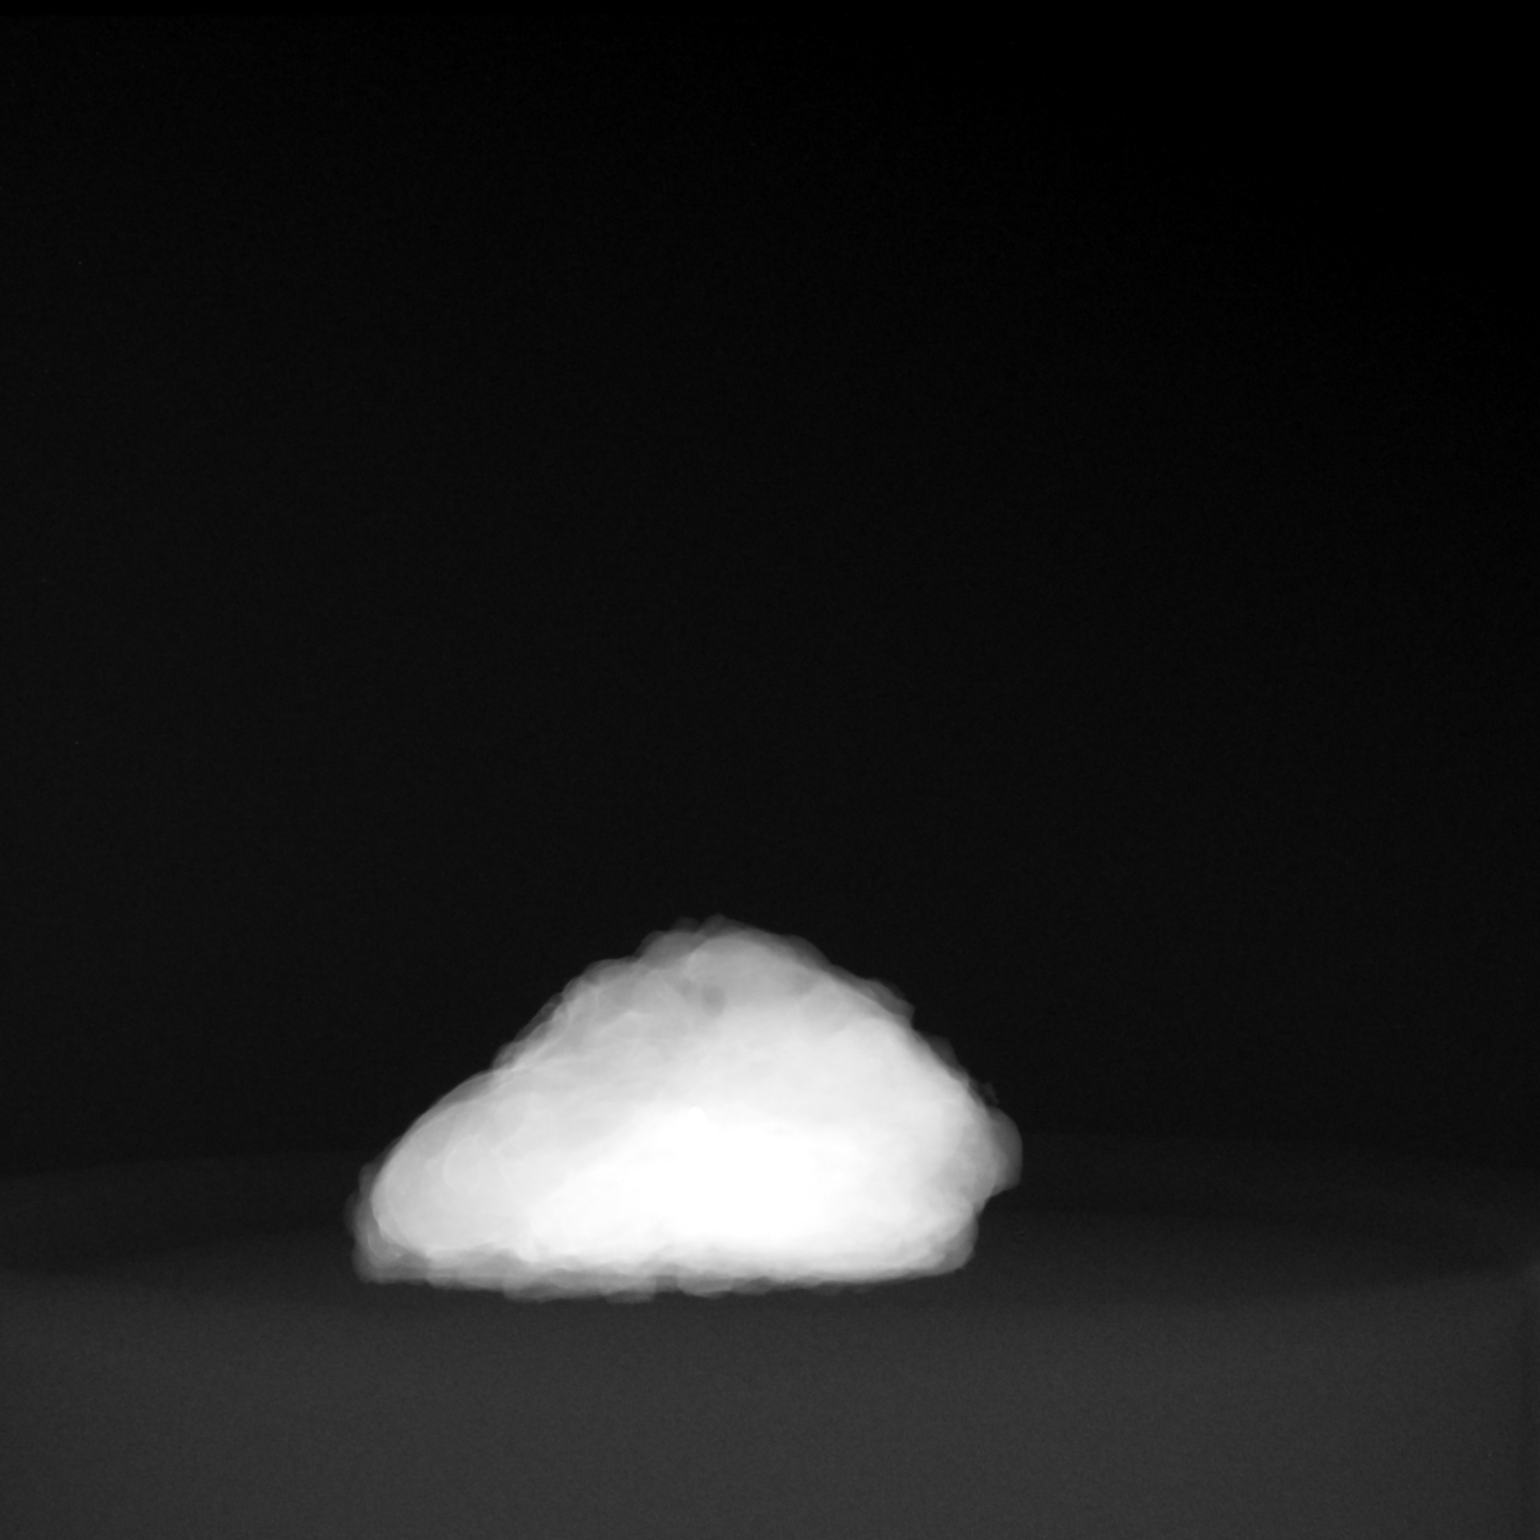
[im 2/2]
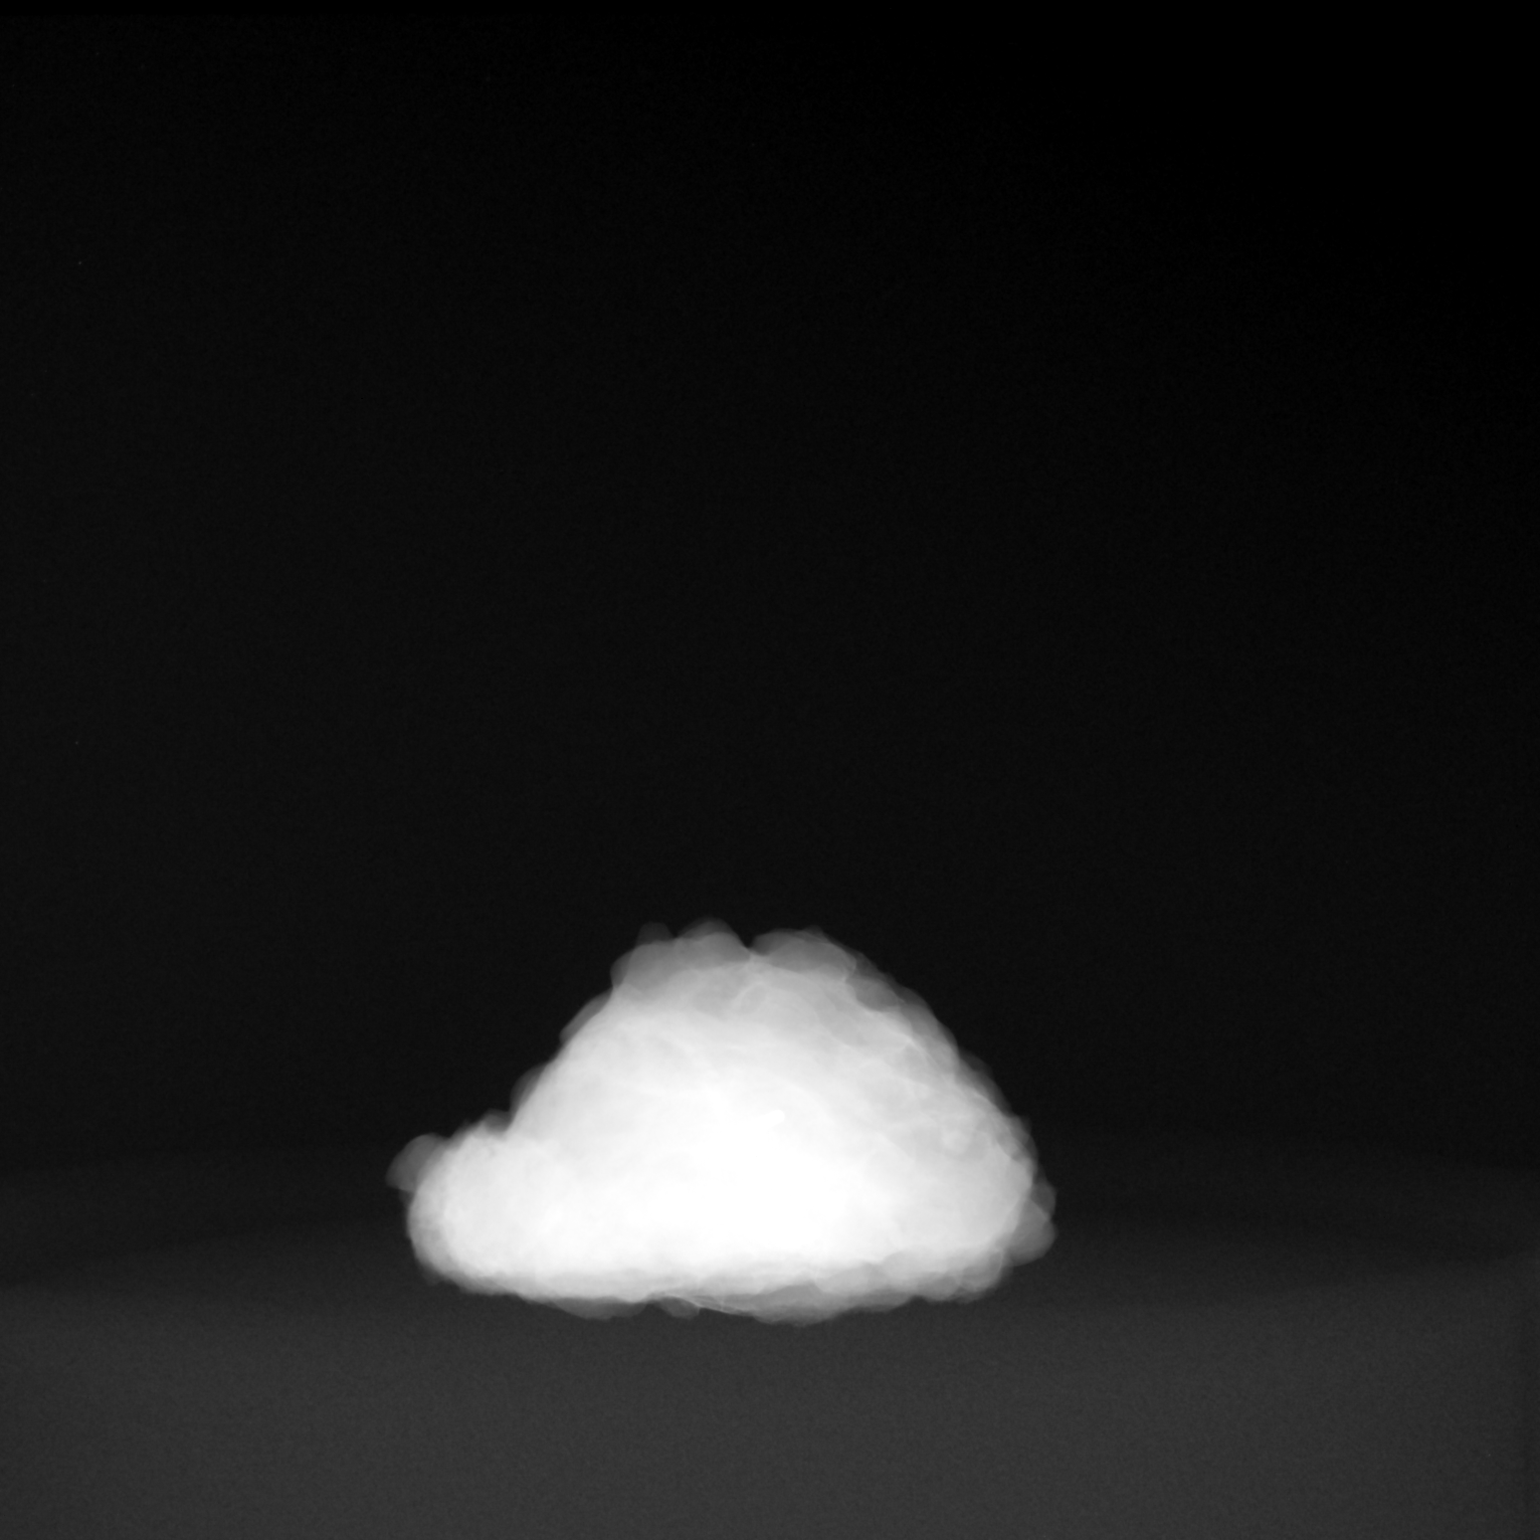

[2 of 2 positions shown; findings below may reference images not displayed]

FINDINGS: Status post excision of the RIGHT ribbon shaped breast. The
radioactive seed and biopsy marker clip are present, completely
intact, and were marked for pathology.

In a separate specimen, the seed and coil shaped clip are present
and intact.

Findings are discussed with the operating room nurse at the time of
interpretation.
IMPRESSION: Radiograph of 2 specimens in the RIGHT breast.

## 2021-01-02 IMAGING — MG MM BREAST SURGICAL SPECIMEN
1 series · 2 of 2 positions shown · non-contrast
Comparison: Previous exam(s).

CLINICAL DATA: Status post 2 site seed localization of the RIGHT
breast.

EXAM:
SPECIMEN RADIOGRAPH OF THE RIGHT BREAST

[Series 1: R · right · 0.07mm/px · 2 of 2 slices shown]
[im 1/2]
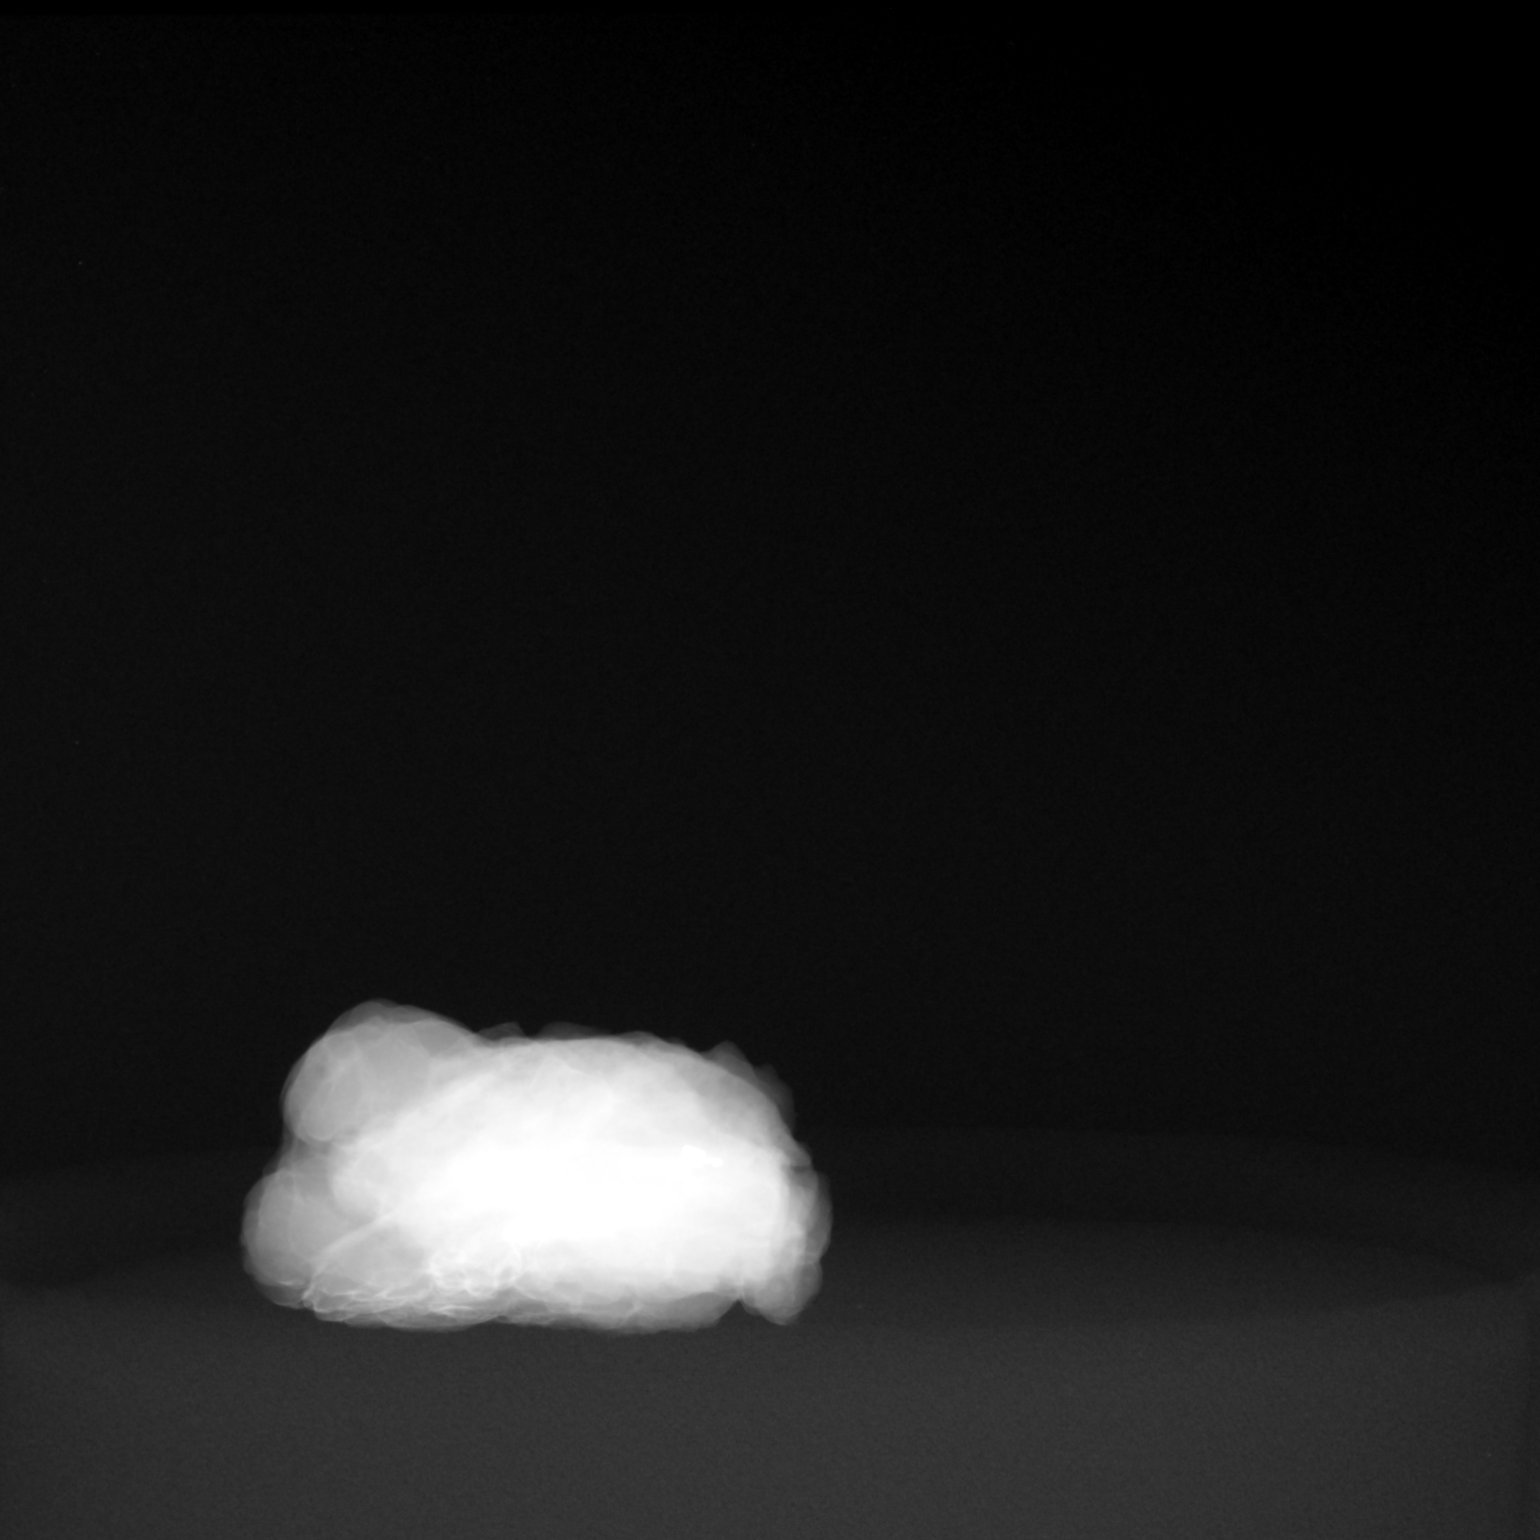
[im 2/2]
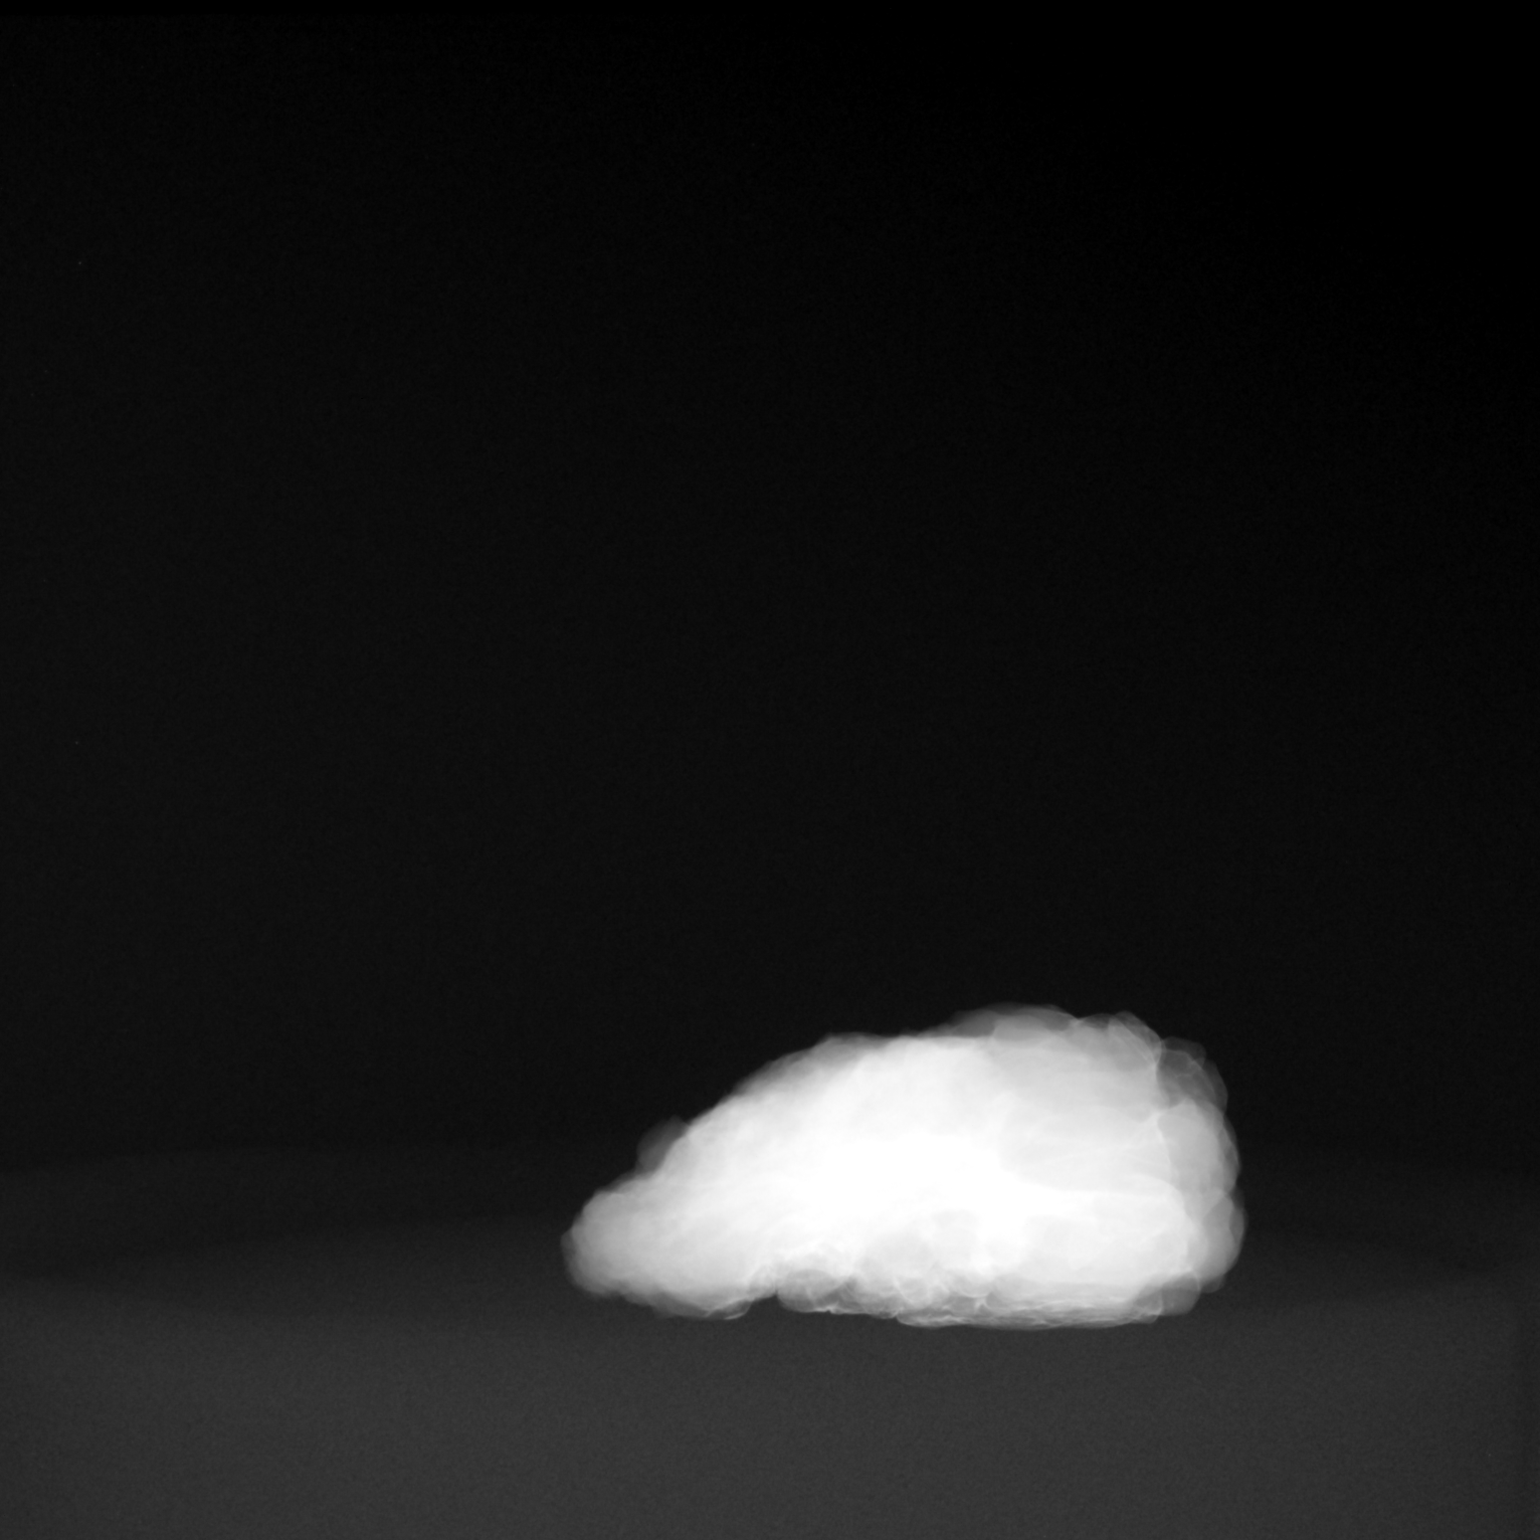

[2 of 2 positions shown; findings below may reference images not displayed]

FINDINGS: Status post excision of the RIGHT ribbon shaped breast. The
radioactive seed and biopsy marker clip are present, completely
intact, and were marked for pathology.

In a separate specimen, the seed and coil shaped clip are present
and intact.

Findings are discussed with the operating room nurse at the time of
interpretation.
IMPRESSION: Radiograph of 2 specimens in the RIGHT breast.

## 2021-01-02 SURGERY — BREAST LUMPECTOMY WITH RADIOACTIVE SEED LOCALIZATION
Anesthesia: General | Site: Breast | Laterality: Right

## 2021-01-02 MED ORDER — CEFAZOLIN SODIUM-DEXTROSE 2-4 GM/100ML-% IV SOLN: 2.0000 g | INTRAVENOUS | Status: DC

## 2021-01-02 MED ORDER — LACTATED RINGERS IV SOLN: INTRAVENOUS | Status: DC | PRN

## 2021-01-02 MED ORDER — LIDOCAINE 2% (20 MG/ML) 5 ML SYRINGE
INTRAMUSCULAR | Status: AC
  Filled 2021-01-02: qty 5

## 2021-01-02 MED ORDER — FENTANYL CITRATE (PF) 100 MCG/2ML IJ SOLN
INTRAMUSCULAR | Status: DC | PRN
  Administered 2021-01-02: 25 ug via INTRAVENOUS
  Administered 2021-01-02: 50 ug via INTRAVENOUS
  Administered 2021-01-02: 25 ug via INTRAVENOUS

## 2021-01-02 MED ORDER — OXYCODONE HCL 5 MG/5ML PO SOLN: 5.0000 mg | Freq: Once | ORAL | Status: DC | PRN

## 2021-01-02 MED ORDER — AMISULPRIDE (ANTIEMETIC) 5 MG/2ML IV SOLN: 10.0000 mg | Freq: Once | INTRAVENOUS | Status: DC | PRN

## 2021-01-02 MED ORDER — BUPIVACAINE-EPINEPHRINE (PF) 0.25% -1:200000 IJ SOLN
INTRAMUSCULAR | Status: AC
  Filled 2021-01-02: qty 30

## 2021-01-02 MED ORDER — MIDAZOLAM HCL 2 MG/2ML IJ SOLN
INTRAMUSCULAR | Status: DC | PRN
  Administered 2021-01-02: 2 mg via INTRAVENOUS

## 2021-01-02 MED ORDER — KETOROLAC TROMETHAMINE 30 MG/ML IJ SOLN
30.0000 mg | Freq: Four times a day (QID) | INTRAMUSCULAR | Status: DC | PRN
  Administered 2021-01-02: 30 mg via INTRAVENOUS

## 2021-01-02 MED ORDER — GABAPENTIN 300 MG PO CAPS
300.0000 mg | ORAL_CAPSULE | ORAL | Status: AC
  Administered 2021-01-02: 300 mg via ORAL

## 2021-01-02 MED ORDER — OXYCODONE HCL 5 MG PO TABS
ORAL_TABLET | ORAL | Status: AC
  Filled 2021-01-02: qty 1

## 2021-01-02 MED ORDER — KETOROLAC TROMETHAMINE 30 MG/ML IJ SOLN
INTRAMUSCULAR | Status: AC
  Filled 2021-01-02: qty 1

## 2021-01-02 MED ORDER — OXYCODONE HCL 5 MG PO TABS: 5.0000 mg | ORAL_TABLET | Freq: Once | ORAL | Status: DC | PRN

## 2021-01-02 MED ORDER — ONDANSETRON HCL 4 MG/2ML IJ SOLN
INTRAMUSCULAR | Status: DC | PRN
  Administered 2021-01-02: 4 mg via INTRAVENOUS

## 2021-01-02 MED ORDER — EPHEDRINE SULFATE 50 MG/ML IJ SOLN
INTRAMUSCULAR | Status: DC | PRN
  Administered 2021-01-02 (×2): 10 mg via INTRAVENOUS

## 2021-01-02 MED ORDER — CHLORHEXIDINE GLUCONATE CLOTH 2 % EX PADS: 6.0000 | MEDICATED_PAD | Freq: Once | CUTANEOUS | Status: DC

## 2021-01-02 MED ORDER — BUPIVACAINE HCL (PF) 0.25 % IJ SOLN
INTRAMUSCULAR | Status: AC
  Filled 2021-01-02: qty 60

## 2021-01-02 MED ORDER — CEFAZOLIN SODIUM-DEXTROSE 2-3 GM-%(50ML) IV SOLR
INTRAVENOUS | Status: DC | PRN
  Administered 2021-01-02: 2 g via INTRAVENOUS

## 2021-01-02 MED ORDER — PROPOFOL 10 MG/ML IV BOLUS
INTRAVENOUS | Status: DC | PRN
  Administered 2021-01-02: 200 mg via INTRAVENOUS

## 2021-01-02 MED ORDER — FENTANYL CITRATE (PF) 100 MCG/2ML IJ SOLN: 25.0000 ug | INTRAMUSCULAR | Status: DC | PRN

## 2021-01-02 MED ORDER — FENTANYL CITRATE (PF) 100 MCG/2ML IJ SOLN
INTRAMUSCULAR | Status: AC
  Filled 2021-01-02: qty 2

## 2021-01-02 MED ORDER — PROMETHAZINE HCL 25 MG/ML IJ SOLN: 6.2500 mg | INTRAMUSCULAR | Status: DC | PRN

## 2021-01-02 MED ORDER — ACETAMINOPHEN 325 MG PO TABS: 325.0000 mg | ORAL_TABLET | ORAL | Status: DC | PRN

## 2021-01-02 MED ORDER — ACETAMINOPHEN 10 MG/ML IV SOLN: 1000.0000 mg | Freq: Once | INTRAVENOUS | Status: DC | PRN

## 2021-01-02 MED ORDER — BUPIVACAINE-EPINEPHRINE (PF) 0.25% -1:200000 IJ SOLN
INTRAMUSCULAR | Status: DC | PRN
  Administered 2021-01-02: 20 mL

## 2021-01-02 MED ORDER — DEXAMETHASONE SODIUM PHOSPHATE 10 MG/ML IJ SOLN
INTRAMUSCULAR | Status: AC
  Filled 2021-01-02: qty 1

## 2021-01-02 MED ORDER — LACTATED RINGERS IV SOLN: INTRAVENOUS | Status: DC

## 2021-01-02 MED ORDER — GABAPENTIN 300 MG PO CAPS
ORAL_CAPSULE | ORAL | Status: AC
  Filled 2021-01-02: qty 1

## 2021-01-02 MED ORDER — ONDANSETRON HCL 4 MG/2ML IJ SOLN
INTRAMUSCULAR | Status: AC
  Filled 2021-01-02: qty 2

## 2021-01-02 MED ORDER — ACETAMINOPHEN 160 MG/5ML PO SOLN: 325.0000 mg | ORAL | Status: DC | PRN

## 2021-01-02 MED ORDER — MIDAZOLAM HCL 2 MG/2ML IJ SOLN
INTRAMUSCULAR | Status: AC
  Filled 2021-01-02: qty 2

## 2021-01-02 MED ORDER — ACETAMINOPHEN 500 MG PO TABS
1000.0000 mg | ORAL_TABLET | ORAL | Status: AC
  Administered 2021-01-02: 1000 mg via ORAL

## 2021-01-02 MED ORDER — ACETAMINOPHEN 500 MG PO TABS
ORAL_TABLET | ORAL | Status: AC
  Filled 2021-01-02: qty 2

## 2021-01-02 MED ORDER — PROPOFOL 10 MG/ML IV BOLUS
INTRAVENOUS | Status: AC
  Filled 2021-01-02: qty 40

## 2021-01-02 MED ORDER — DEXAMETHASONE SODIUM PHOSPHATE 10 MG/ML IJ SOLN
INTRAMUSCULAR | Status: DC | PRN
  Administered 2021-01-02: 5 mg via INTRAVENOUS

## 2021-01-02 MED ORDER — HYDROCODONE-ACETAMINOPHEN 5-325 MG PO TABS: 1.0000 | ORAL_TABLET | Freq: Four times a day (QID) | ORAL | 0 refills | Status: DC | PRN

## 2021-01-02 MED ORDER — CEFAZOLIN SODIUM-DEXTROSE 2-4 GM/100ML-% IV SOLN
INTRAVENOUS | Status: AC
  Filled 2021-01-02: qty 100

## 2021-01-02 MED ORDER — LIDOCAINE HCL (CARDIAC) PF 100 MG/5ML IV SOSY
PREFILLED_SYRINGE | INTRAVENOUS | Status: DC | PRN
  Administered 2021-01-02: 40 mg via INTRAVENOUS

## 2021-01-02 SURGICAL SUPPLY — 41 items
ADH SKN CLS APL DERMABOND .7 (GAUZE/BANDAGES/DRESSINGS) ×1
APL PRP STRL LF DISP 70% ISPRP (MISCELLANEOUS) ×1
APPLIER CLIP 9.375 MED OPEN (MISCELLANEOUS) ×2
APR CLP MED 9.3 20 MLT OPN (MISCELLANEOUS) ×1
BLADE SURG 15 STRL LF DISP TIS (BLADE) ×1 IMPLANT
BLADE SURG 15 STRL SS (BLADE) ×2
CANISTER SUC SOCK COL 7IN (MISCELLANEOUS) ×2 IMPLANT
CANISTER SUCT 1200ML W/VALVE (MISCELLANEOUS) ×2 IMPLANT
CHLORAPREP W/TINT 26 (MISCELLANEOUS) ×2 IMPLANT
CLIP APPLIE 9.375 MED OPEN (MISCELLANEOUS) IMPLANT
COVER BACK TABLE 60X90IN (DRAPES) ×2 IMPLANT
COVER MAYO STAND STRL (DRAPES) ×2 IMPLANT
COVER PROBE W GEL 5X96 (DRAPES) ×2 IMPLANT
DERMABOND ADVANCED (GAUZE/BANDAGES/DRESSINGS) ×1
DERMABOND ADVANCED .7 DNX12 (GAUZE/BANDAGES/DRESSINGS) ×1 IMPLANT
DRAPE LAPAROSCOPIC ABDOMINAL (DRAPES) ×2 IMPLANT
DRAPE UTILITY XL STRL (DRAPES) ×2 IMPLANT
ELECT COATED BLADE 2.86 ST (ELECTRODE) ×2 IMPLANT
ELECT REM PT RETURN 9FT ADLT (ELECTROSURGICAL) ×2
ELECTRODE REM PT RTRN 9FT ADLT (ELECTROSURGICAL) ×1 IMPLANT
GLOVE SURG ENC MOIS LTX SZ7.5 (GLOVE) ×4 IMPLANT
GLOVE SURG UNDER POLY LF SZ6.5 (GLOVE) ×1 IMPLANT
GLOVE SURG UNDER POLY LF SZ7 (GLOVE) ×1 IMPLANT
GOWN STRL REUS W/ TWL LRG LVL3 (GOWN DISPOSABLE) ×2 IMPLANT
GOWN STRL REUS W/TWL LRG LVL3 (GOWN DISPOSABLE) ×4
KIT MARKER MARGIN INK (KITS) ×2 IMPLANT
NDL HYPO 25X1 1.5 SAFETY (NEEDLE) IMPLANT
NEEDLE HYPO 25X1 1.5 SAFETY (NEEDLE) ×2 IMPLANT
NS IRRIG 1000ML POUR BTL (IV SOLUTION) IMPLANT
PACK BASIN DAY SURGERY FS (CUSTOM PROCEDURE TRAY) ×2 IMPLANT
PENCIL SMOKE EVACUATOR (MISCELLANEOUS) ×2 IMPLANT
SLEEVE SCD COMPRESS KNEE MED (STOCKING) ×2 IMPLANT
SPONGE T-LAP 18X18 ~~LOC~~+RFID (SPONGE) ×2 IMPLANT
SUT MON AB 4-0 PC3 18 (SUTURE) ×2 IMPLANT
SUT SILK 2 0 SH (SUTURE) IMPLANT
SUT VICRYL 3-0 CR8 SH (SUTURE) ×2 IMPLANT
SYR CONTROL 10ML LL (SYRINGE) ×1 IMPLANT
TOWEL GREEN STERILE FF (TOWEL DISPOSABLE) ×2 IMPLANT
TRAY FAXITRON CT DISP (TRAY / TRAY PROCEDURE) ×2 IMPLANT
TUBE CONNECTING 20X1/4 (TUBING) ×2 IMPLANT
YANKAUER SUCT BULB TIP NO VENT (SUCTIONS) IMPLANT

## 2021-01-02 NOTE — Anesthesia Procedure Notes (Signed)
Procedure Name: LMA Insertion Date/Time: 01/02/2021 8:49 AM Performed by: Verita Lamb, CRNA Pre-anesthesia Checklist: Patient identified, Emergency Drugs available, Suction available and Patient being monitored Patient Re-evaluated:Patient Re-evaluated prior to induction Oxygen Delivery Method: Circle system utilized Preoxygenation: Pre-oxygenation with 100% oxygen Induction Type: IV induction Ventilation: Mask ventilation without difficulty LMA: LMA inserted LMA Size: 4.0 Number of attempts: 1 Airway Equipment and Method: Bite block Placement Confirmation: positive ETCO2, CO2 detector and breath sounds checked- equal and bilateral Tube secured with: Tape Dental Injury: Teeth and Oropharynx as per pre-operative assessment

## 2021-01-02 NOTE — Op Note (Signed)
01/02/2021  9:38 AM  PATIENT:  Kim Archer  55 y.o. female  PRE-OPERATIVE DIAGNOSIS:  RIGHT BREAST CANCER WITH BONE METS  POST-OPERATIVE DIAGNOSIS:  RIGHT BREAST CANCER WITH BONE METS  PROCEDURE:  Procedure(s): RIGHT BREAST LUMPECTOMY WITH RADIOACTIVE SEED LOCALIZATION X2 (Right)  SURGEON:  Surgeon(s) and Role:    * Jovita Kussmaul, MD - Primary  PHYSICIAN ASSISTANT:   ASSISTANTS: none   ANESTHESIA:   local and general  EBL:  10 mL   BLOOD ADMINISTERED:none  DRAINS: none   LOCAL MEDICATIONS USED:  MARCAINE     SPECIMEN:  Source of Specimen:  right breast tissue inferior/medial with additional medial margin and right breast tissue superior/lateral  DISPOSITION OF SPECIMEN:  PATHOLOGY  COUNTS:  YES  TOURNIQUET:  * No tourniquets in log *  DICTATION: .Dragon Dictation  After informed consent was obtained the patient was brought to the operating room and placed in the supine position on the operating table.  After adequate induction of general anesthesia the patient's right breast was prepped with ChloraPrep, allowed to dry, and draped in usual sterile manner.  An appropriate timeout was performed.  Previously 2 I-125 seeds were placed in the lower outer and upper outer right breast to mark 2 areas of breast cancer that were previously seen.  The patient underwent neoadjuvant therapy and has had a complete radiographic response.  She does have bone metastatic disease so she will not need a note evaluation since her nodes returned to normal radiographically.  The neoprobe was set to I-125 in the area of the 2 radioactive seeds was readily identified.  I elected to make a curvilinear incision with a 15 blade knife along the outer edge of the right breast close to where the 2 seeds were located.  The incision was carried through the skin and subcutaneous tissue sharply with the electrocautery.  Attention was first turned to the more inferior medial location.  The breast tissue  in this area was separated from the subcutaneous fat and skin sharply with the electrocautery.  Once the dissection was beyond the area of the cancer then I removed a circular portion of breast tissue sharply with the electrocautery around the radioactive seed while checking the area of radioactivity frequently.  Once the specimen was removed it was oriented with the appropriate paint colors.  A specimen radiograph was obtained that showed the clip and seed to be within the specimen.  It did look close to the medial margin so an additional medial margin was taken sharply with the electrocautery and marked appropriately.  This tissue was then sent to pathology for further evaluation.  Through the same incision attention was then turned to the more superior and lateral area.  Again the section was carried out between the breast tissue and the subcutaneous fat and skin in this location until the dissection was beyond the area of the cancer.  I then removed a circular portion of breast tissue sharply with electrocautery around the radioactive seed while checking the area of radioactivity frequently.  Once the specimen was removed it was oriented with the appropriate paint colors.  A specimen radiograph was obtained that showed the clip and seed to be near the center of the specimen.  The specimen was then sent to pathology for further evaluation.  Hemostasis was achieved using the Bovie electrocautery.  The wound was irrigated with saline and infiltrated with more quarter percent Marcaine.  The deep layer of the incision was then closed with  layers of interrupted 3-0 Vicryl stitches.  The skin was then closed with a running 4-0 Monocryl subcuticular stitch.  Dermabond dressings were applied.  The patient tolerated the procedure well.  At the end of the case all needle sponge and instrument counts were correct.  The patient was then awakened and taken to recovery in stable condition.  PLAN OF CARE: Discharge to home  after PACU  PATIENT DISPOSITION:  PACU - hemodynamically stable.   Delay start of Pharmacological VTE agent (>24hrs) due to surgical blood loss or risk of bleeding: not applicable

## 2021-01-02 NOTE — Anesthesia Postprocedure Evaluation (Signed)
Anesthesia Post Note  Patient: Kim Archer  Procedure(s) Performed: RIGHT BREAST LUMPECTOMY WITH RADIOACTIVE SEED LOCALIZATION X2 (Right: Breast)     Patient location during evaluation: PACU Anesthesia Type: General Level of consciousness: awake and alert Pain management: pain level controlled Vital Signs Assessment: post-procedure vital signs reviewed and stable Respiratory status: spontaneous breathing, nonlabored ventilation, respiratory function stable and patient connected to nasal cannula oxygen Cardiovascular status: blood pressure returned to baseline and stable Postop Assessment: no apparent nausea or vomiting Anesthetic complications: no   No notable events documented.  Last Vitals:  Vitals:   01/02/21 1015 01/02/21 1027  BP: 120/63 (!) 123/59  Pulse: 75 75  Resp: 15 16  Temp:  36.5 C  SpO2: 98% 97%    Last Pain:  Vitals:   01/02/21 1112  TempSrc:   PainSc: [place]

## 2021-01-02 NOTE — Anesthesia Preprocedure Evaluation (Signed)
Anesthesia Evaluation  Patient identified by MRN, date of birth, ID band Patient awake    Reviewed: Allergy & Precautions, NPO status , Patient's Chart, lab work & pertinent test results  Airway Mallampati: II  TM Distance: >3 FB Neck ROM: Full    Dental  (+) Teeth Intact   Pulmonary neg pulmonary ROS,    breath sounds clear to auscultation       Cardiovascular negative cardio ROS   Rhythm:Regular Rate:Normal     Neuro/Psych negative neurological ROS  negative psych ROS   GI/Hepatic negative GI ROS, Neg liver ROS,   Endo/Other  negative endocrine ROS  Renal/GU negative Renal ROS  negative genitourinary   Musculoskeletal negative musculoskeletal ROS (+)   Abdominal   Peds negative pediatric ROS (+)  Hematology negative hematology ROS (+)   Anesthesia Other Findings   Reproductive/Obstetrics negative OB ROS                             Anesthesia Physical  Anesthesia Plan  ASA: I  Anesthesia Plan: General   Post-op Pain Management:    Induction: Intravenous  PONV Risk Score and Plan:   Airway Management Planned: LMA  Additional Equipment:   Intra-op Plan:   Post-operative Plan: Extubation in OR  Informed Consent:     Dental advisory given  Plan Discussed with: CRNA  Anesthesia Plan Comments:         Anesthesia Quick Evaluation

## 2021-01-02 NOTE — Interval H&P Note (Signed)
History and Physical Interval Note:  01/02/2021 8:08 AM  Kim Archer  has presented today for surgery, with the diagnosis of RIGHT BREAST CANCER.  The various methods of treatment have been discussed with the patient and family. After consideration of risks, benefits and other options for treatment, the patient has consented to  Procedure(s): RIGHT BREAST LUMPECTOMY WITH RADIOACTIVE SEED LOCALIZATION X2 (Right) as a surgical intervention.  The patient's history has been reviewed, patient examined, no change in status, stable for surgery.  I have reviewed the patient's chart and labs.  Questions were answered to the patient's satisfaction.     Autumn Messing III

## 2021-01-02 NOTE — Transfer of Care (Signed)
Immediate Anesthesia Transfer of Care Note  Patient: Kim Archer  Procedure(s) Performed: RIGHT BREAST LUMPECTOMY WITH RADIOACTIVE SEED LOCALIZATION X2 (Right: Breast)  Patient Location: PACU  Anesthesia Type:General  Level of Consciousness: awake and alert   Airway & Oxygen Therapy: Patient Spontanous Breathing and Patient connected to face mask oxygen  Post-op Assessment: Report given to RN and Post -op Vital signs reviewed and stable  Post vital signs: Reviewed and stable  Last Vitals:  Vitals Value Taken Time  BP    Temp    Pulse 67 01/02/21 0944  Resp    SpO2 98 % 01/02/21 0944  Vitals shown include unvalidated device data.  Last Pain:  Vitals:   01/02/21 0714  TempSrc: Oral  PainSc: 0-No pain      Patients Stated Pain Goal: 3 (59/74/16 [id number])  Complications: No notable events documented.

## 2021-01-02 NOTE — H&P (Signed)
PROVIDER: Landry Corporal, MD  MRN: [id number] DOB: Jan 23, 1966 Subjective   Chief Complaint: re-check   History of Present Illness: Kim Archer is a 55 y.o. female who is seen today for right breast cancer. The patient is a 55 year old white female who has a known right breast cancer in the outer aspect that measured 2.9 and 1.5 cm with 3 positive lymph nodes. The cancer was ER and PR positive and HER2 positive with a Ki-67 of 40%. She also had metastatic disease to the bone. She has been receiving neoadjuvant chemotherapy and has tolerated it fairly well. On MRI there is no residual enhancement in the right breast. The MR incompletely imaged in the right axilla also she is scheduled for an ultrasound of the right axilla to look at these nodes. She is also scheduled for biopsy of 2 new small spots that showed up in the left breast.  Review of Systems: A complete review of systems was obtained from the patient. I have reviewed this information and discussed as appropriate with the patient. See HPI as well for other ROS.  ROS   Medical History: No past medical history on file.  Patient Active Problem List  Diagnosis   Malignant neoplasm of lower-outer quadrant of right breast of female, estrogen receptor positive (CMS-HCC)   No past surgical history on file.   Allergies  Allergen Reactions   Ciprofloxacin Other (See Comments)  Patient came into offoice with complaints of difficulty swallowing. Ate at [employer] prior and took Cipro prior Patient came into office with complaints of difficulty swallowing. Ate at [employer] prior and took Cipro prior   Sulfa (Sulfonamide Antibiotics) Unknown and Rash   No current outpatient medications on file prior to visit.   No current facility-administered medications on file prior to visit.   No family history on file.   Social History   Tobacco Use  Smoking Status Not on file  Smokeless Tobacco Not on file    Social History    Socioeconomic History   Marital status: Married   Objective:   Vitals:  BP: 120/72  Pulse: 78  Temp: 36.9 C (98.5 F)  SpO2: 99%  Weight: 78.7 kg (173 lb 9.6 oz)  Height: 170.2 cm ('5\' 7"' )   Body mass index is 27.19 kg/m.  Physical Exam Vitals reviewed.  Constitutional:  General: She is not in acute distress. Appearance: Normal appearance.  HENT:  Head: Normocephalic and atraumatic.  Right Ear: External ear normal.  Left Ear: External ear normal.  Nose: Nose normal.  Mouth/Throat:  Mouth: Mucous membranes are moist.  Pharynx: Oropharynx is clear.  Eyes:  General: No scleral icterus. Extraocular Movements: Extraocular movements intact.  Conjunctiva/sclera: Conjunctivae normal.  Pupils: Pupils are equal, round, and reactive to light.  Cardiovascular:  Rate and Rhythm: Normal rate and regular rhythm.  Pulses: Normal pulses.  Heart sounds: Normal heart sounds.  Pulmonary:  Effort: Pulmonary effort is normal. No respiratory distress.  Breath sounds: Normal breath sounds.  Abdominal:  General: Bowel sounds are normal.  Palpations: Abdomen is soft.  Tenderness: There is no abdominal tenderness.  Musculoskeletal:  General: No swelling, tenderness or deformity. Normal range of motion.  Cervical back: Normal range of motion and neck supple.  Skin: General: Skin is warm and dry.  Coloration: Skin is not jaundiced.  Neurological:  General: No focal deficit present.  Mental Status: She is alert and oriented to person, place, and time.  Psychiatric:  Mood and Affect: Mood normal.  Behavior: Behavior normal.   Breast: There is no palpable mass in either breast. There is no palpable axillary, supraclavicular, or cervical lymphadenopathy  Labs, Imaging and Diagnostic Testing:  Assessment and Plan:  Diagnoses and all orders for this visit:  Malignant neoplasm of lower-outer quadrant of right breast of female, estrogen receptor positive (CMS-HCC)    The patient  has metastatic breast cancer with disease in the bones. She has completed neoadjuvant chemotherapy and will remain on Herceptin indefinitely. At this point we are thinking about trying to remove the primary from the right breast and at this point she is favoring lumpectomy which I feel is reasonable. If the lymph nodes look normal then we will likely avoid lymph node surgery as this will not improve her survival. We will wait for the results of the ultrasound and the biopsies of the left breast lesions and then call her with the results. I have discussed with her in detail the risks and benefits of the operation as well as some of the technical aspects including use of a radioactive seed for localization and she understands and wishes to proceed

## 2021-01-02 NOTE — Discharge Instructions (Signed)

## 2021-01-05 ENCOUNTER — Encounter (HOSPITAL_BASED_OUTPATIENT_CLINIC_OR_DEPARTMENT_OTHER): Payer: Self-pay | Admitting: General Surgery

## 2021-01-07 LAB — SURGICAL PATHOLOGY

## 2021-01-11 NOTE — Progress Notes (Signed)
[facility]  [phone] [phone]    ID: BERENISE HUNTON DOB: 27-Jun-1965  MR#: [id number]  ZDG#:[id number]  Patient Care Team: [name], MD as PCP - General (Obstetrics and Gynecology) [name], MD as Consulting Physician (Obstetrics and Gynecology) Mauro Kaufmann, RN as Oncology Nurse Navigator Rockwell Germany, RN as Oncology Nurse Navigator Jovita Kussmaul, MD as Consulting Physician (General Surgery) Renelda Kilian, Virgie Dad, MD as Consulting Physician (Oncology) Gery Pray, MD as Consulting Physician (Radiation Oncology) Raina Mina, RPH-CPP (Pharmacist) Larey Dresser, MD as Consulting Physician (Cardiology) Chauncey Cruel, MD OTHER MD:  CHIEF COMPLAINT: Triple positive breast cancer  CURRENT TREATMENT: anti-HER2 immunotherapy; zometa   INTERVAL HISTORY: Kim Archer returns today for follow up and treatment of her triple positive breast cancer.  She is accompanied by her husband.  Since her last visit, she underwent right lumpectomy x2 on 01/02/2021 under Dr. Marlou Starks. Pathology from the procedure [id number]) showed:  A. BREAST, RIGHT INFERIOR/MEDIAL, LUMPECTOMY:  - Invasive ductal carcinoma, grade 2* - Resection margins are negative for carcinoma  - See oncology table  - See comment   B. BREAST, RIGHT ADDITIONAL MEDIAL MARGIN FROM INFERIOR/MEDIAL TISSUE,  EXCISION:  - Small foci of invasive ductal carcinoma  - New medial margin is 0.4 cm from carcinoma  - Fibrocystic change  - See comment   C. BREAST, RIGHT LATERAL/SUPERIOR, LUMPECTOMY:  - Foci of invasive ductal carcinoma, grade 2* - Resection margins are negative for carcinoma - closest is the inferior  margin at 1.5 mm    * Tumor size cannot be estimated as no discrete lesion was identified  She continues on Herceptin and Perjeta.  Prior to treatment she used to have a bowel movement every 3 days.  Now she has a "messy" bowel movement most days.  She  has a repeat echocardiogram scheduled 01/21/2021 We began Zometa with cycle 2 on 08/19/2020 with a second dose 11/11/2020.  Her next dose will be due in December   REVIEW OF SYSTEMS: Kim Archer did remarkably well with her surgery.  She had practically no pain certainly no bleeding or fever or swelling and no dehiscence.  She has a little bit of itching at the wound.  She is not walking regularly but she is very active, took care of Thanksgiving, is putting up her Christmas decorations, and is doing some yard work.  She is pretty much at baseline.  Detailed review of systems today was otherwise stable.  COVID 19 VACCINATION STATUS: not vaccinated; infection 10/2019   HISTORY OF CURRENT ILLNESS: From the original intake note:  LORMA HEATER "Kim Archer" [facility]) was previously seen in the high risk breast cancer clinic on 05/11/2016. She had undergone left lumpectomy on 03/22/2016 showing atypical lobular hyperplasia. She was prescribed tamoxifen at that time, which she believes she took for two years. [She underwent hysterectomy with bilateral salpingo-oophorectomy 03/28/2019, pathology showing leiomyomas and adenomyosis, and may have stopped the tamoxifen around that time].  More recently, she had routine screening mammography on 06/11/2020 showing an area of asymmetry in the right breast. She underwent right diagnostic mammography with tomography and right breast ultrasonography at [facility] on 06/20/2020 showing: breast density category C; 2.4 cm right breast mass at 7 o'clock; one abnormal right axillary lymph node measuring 1.5 cm.  Accordingly on 06/25/2020 she proceeded to biopsy of the right breast area in question. The pathology from this procedure ([id number]) showed: invasive mammary carcinoma, e-cadherin positive, grade 2 or 3. Prognostic  indicators significant for: estrogen receptor, 40% positive and progesterone receptor, >95% positive, both with strong staining intensity. Proliferation  marker Ki67 at 40%. HER2 positive by immunohistochemistry (3+).  Biopsy of the right axillary lymph node performed at the same time showed mammary carcinoma, with no residual lymph node tissue present.   Cancer Staging  Malignant neoplasm of lower-outer quadrant of right breast of female, estrogen receptor positive ([place]) Staging form: Breast, AJCC 8th Edition - Clinical stage from 07/02/2020: Stage IV (cT2, cN1, cM1, G3, ER+, PR+, HER2+) - Signed by Chauncey Cruel, MD on 08/04/2020 Stage prefix: Initial diagnosis Histologic grading system: 3 grade system Laterality: Right Staged by: Pathologist and managing physician Stage used in treatment planning: Yes National guidelines used in treatment planning: Yes Type of national guideline used in treatment planning: NCCN  The patient's subsequent history is as detailed below.   PAST MEDICAL HISTORY: Past Medical History:  Diagnosis Date   Allergy    Anemia    due to heavy periods   Atypical ductal hyperplasia of left breast    History of kidney stones    HSV-2 (herpes simplex virus 2) infection    PONV (postoperative nausea and vomiting)    right breast ca 06/2020   Breast cancer    PAST SURGICAL HISTORY: Past Surgical History:  Procedure Laterality Date   BREAST CYST EXCISION Left 03/22/2016   ALH-BENIGN   BREAST LUMPECTOMY WITH RADIOACTIVE SEED LOCALIZATION Left 03/22/2016   Procedure: LEFT BREAST LUMPECTOMY WITH RADIOACTIVE SEED LOCALIZATION;  Surgeon: Autumn Messing III, MD;  Location: [facility];  Service: General;  Laterality: Left;   BREAST LUMPECTOMY WITH RADIOACTIVE SEED LOCALIZATION Right 01/02/2021   Procedure: RIGHT BREAST LUMPECTOMY WITH RADIOACTIVE SEED LOCALIZATION X2;  Surgeon: Jovita Kussmaul, MD;  Location: [facility];  Service: General;  Laterality: Right;   CESAREAN SECTION     x3   COMBINED HYSTEROSCOPY DIAGNOSTIC / D&C  07/17/2018   EYE SURGERY     lt lens replacement   PORTACATH  PLACEMENT Left 07/07/2020   Procedure: INSERTION PORT-A-CATH;  Surgeon: Jovita Kussmaul, MD;  Location: [facility];  Service: General;  Laterality: Left;   TUBAL LIGATION      FAMILY HISTORY: Family History  Problem Relation Age of Onset   Breast cancer Mother    Melanoma Father    Leukemia Sister    Liver cancer Brother        stomach   Breast cancer Maternal Aunt    Colon cancer Neg Hx    Colon polyps Neg Hx    Esophageal cancer Neg Hx    Rectal cancer Neg Hx    Stomach cancer Neg Hx   The patient's father died at age 19. He had a history of melanoma. He has a brother, the patient's uncle, with prostate cancer. The patient's mother died in her 82s from urosepsis. She had a history of breast cancer diagnosed in her 54s. The patient has a brother, with cerebral palsy, who had "stomach cancer" metastatic to the liver. The patient has 2 sisters. One of them died from childhood leukemia.    GYNECOLOGIC HISTORY:  No LMP recorded (lmp unknown). Patient has had a hysterectomy. Menarche: 55 years old Age at first live birth: 55 years old [place] P 3 LMP 03/2019, with hysterectomy Contraceptive: used remotely for many years with no complications HRT never used  Hysterectomy? Yes, 03/2019 (path in Epic) BSO? 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 Her husband [name] (goes by "[name]") is Airline pilot for [employer], which manages apartment complexes. The patient's daughter Kearney Hard, age 75, works as a Theme park manager in [place]. She has 3 children. She, her husband, and their three children are living with [place] and [place]. The patient's daughter Moody Bruins, age 46, lives in [place]. Kim Archer has legal custody of one of Elina's children. The patient's third child, Quillian Quince "Dalbert Mayotte, 56 years old, also lives in [place] and works in Architect. Patient has in total 8 grandchildren.  She is a Psychologist, forensic.    ADVANCED DIRECTIVES: In the absence of any documentation to the contrary, the patient's spouse is their HCPOA.    HEALTH MAINTENANCE: Social History   Tobacco Use   Smoking status: Never   Smokeless tobacco: Never  Vaping Use   Vaping Use: Never used  Substance Use Topics   Alcohol use: No   Drug use: No     Colonoscopy: 08/2018 (Dr. Ardis Hughs), recall 2030  PAP: 05/2020  Bone density: 05/2019 (Dr. Christen Butter office)   Allergies  Allergen Reactions   Ciprofloxacin Other (See Comments)    Patient came into office with complaints of difficulty swallowing.  Ate at [employer] prior and took Cipro prior   Sulfa Antibiotics Rash    Current Outpatient Medications  Medication Sig Dispense Refill   anastrozole (ARIMIDEX) 1 MG tablet Take 1 tablet (1 mg total) by mouth daily. 90 tablet 4   benzonatate (TESSALON) 200 MG capsule Take 1 capsule (200 mg total) by mouth 3 (three) times daily as needed for cough. 20 capsule 0   Boron 3 MG CAPS Take by mouth.     Calcium 500 MG tablet 1,000 mg.     gabapentin (NEURONTIN) 100 MG capsule Take 1 capsule (100 mg total) by mouth at bedtime. 90 capsule 4   loratadine (CLARITIN) 10 MG tablet Take 10 mg by mouth daily.     LUTEIN PO Take by mouth.     MAGNESIUM PO Take 1 tablet by mouth at bedtime.     Menatetrenone (VITAMIN K2) 100 MCG TABS Take by mouth.     Multiple Vitamin (MULTIVITAMIN WITH MINERALS) TABS tablet Take 1 tablet by mouth daily.     tobramycin-dexamethasone (TOBRADEX) ophthalmic solution PLACE 1 DROP INTO BOTH EYES IN THE MORNING AND AT BEDTIME. 5 mL 0   Ubiquinol 50 MG CAPS Take by mouth daily.     valACYclovir (VALTREX) 500 MG tablet   12   VITAMIN D PO Vitamin D     zinc sulfate 220 (50 Zn) MG capsule Take 220 mg by mouth daily.     No current facility-administered medications for this visit.    OBJECTIVE: White woman in no acute distress  Vitals:   01/12/21 0821  BP: 116/61  Pulse: 63  Resp: 16   Temp: 97.9 F (36.6 C)  SpO2: 98%      Body mass index is 27.24 kg/m.   Wt Readings from Last 3 Encounters:  01/12/21 173 lb 14.4 oz (78.9 kg)  01/02/21 171 lb 8.3 oz (77.8 kg)  12/22/20 173 lb 4.8 oz (78.6 kg)     ECOG FS:1 - Symptomatic but completely ambulatory  Hair is coming in salt-and-pepper and quite thick Sclerae unicteric, EOMs intact Wearing a mask No cervical or supraclavicular adenopathy Lungs no rales or rhonchi Heart regular rate and rhythm Abd soft, nontender, positive bowel sounds MSK no focal  spinal tenderness, no upper extremity lymphedema Neuro: nonfocal, well oriented, appropriate affect Breasts: The right breast is status postlumpectomy.  The cosmetic result is excellent.  There is no dehiscence erythema or swelling.  Left breast and both axillae are benign   LAB RESULTS:  CMP     Component Value Date/Time   NA 144 01/12/2021 0808   NA 142 05/11/2016 1508   K 3.9 01/12/2021 0808   K 4.3 05/11/2016 1508   CL 108 01/12/2021 0808   CO2 25 01/12/2021 0808   CO2 26 05/11/2016 1508   GLUCOSE 84 01/12/2021 0808   GLUCOSE 90 05/11/2016 1508   BUN 14 01/12/2021 0808   BUN 10.9 05/11/2016 1508   CREATININE 0.80 01/12/2021 0808   CREATININE 0.77 08/04/2020 1540   CREATININE 1.0 05/11/2016 1508   CALCIUM 9.3 01/12/2021 0808   CALCIUM 9.5 05/11/2016 1508   PROT 6.9 01/12/2021 0808   PROT 7.0 05/11/2016 1508   ALBUMIN 3.9 01/12/2021 0808   ALBUMIN 3.9 05/11/2016 1508   AST 26 01/12/2021 0808   AST 29 08/04/2020 1540   AST 19 05/11/2016 1508   ALT 35 01/12/2021 0808   ALT 28 08/04/2020 1540   ALT 23 05/11/2016 1508   ALKPHOS 86 01/12/2021 0808   ALKPHOS 98 05/11/2016 1508   BILITOT 0.3 01/12/2021 0808   BILITOT 0.3 08/04/2020 1540   BILITOT 0.36 05/11/2016 1508   GFRNONAA >60 01/12/2021 0808   GFRNONAA >60 08/04/2020 1540    No results found for: TOTALPROTELP, ALBUMINELP, A1GS, A2GS, BETS, BETA2SER, GAMS, MSPIKE, SPEI  Lab Results   Component Value Date   WBC 4.9 01/12/2021   NEUTROABS 2.3 01/12/2021   HGB 13.6 01/12/2021   HCT 40.7 01/12/2021   MCV 94.7 01/12/2021   PLT 196 01/12/2021    No results found for: LABCA2  No components found for: [id number]  No results for input(s): INR in the last 168 hours.  No results found for: LABCA2  No results found for: CAN199  No results found for: ZDG644  No results found for: IHK742  Lab Results  Component Value Date   CA2729 37.8 08/21/2020    No components found for: HGQUANT  No results found for: CEA1 / No results found for: CEA1   No results found for: AFPTUMOR  No results found for: CHROMOGRNA  No results found for: KPAFRELGTCHN, LAMBDASER, KAPLAMBRATIO (kappa/lambda light chains)  No results found for: HGBA, HGBA2QUANT, HGBFQUANT, HGBSQUAN (Hemoglobinopathy evaluation)   No results found for: LDH  No results found for: IRON, TIBC, IRONPCTSAT (Iron and TIBC)  No results found for: FERRITIN  Urinalysis    Component Value Date/Time   COLORURINE AMBER (A) 10/03/2020 1003   APPEARANCEUR CLEAR 10/03/2020 1003   LABSPEC 1.012 10/03/2020 1003   PHURINE 6.0 10/03/2020 1003   GLUCOSEU NEGATIVE 10/03/2020 1003   HGBUR LARGE (A) 10/03/2020 1003   BILIRUBINUR NEGATIVE 10/03/2020 1003   KETONESUR NEGATIVE 10/03/2020 1003   [place] 10/03/2020 1003   NITRITE POSITIVE (A) 10/03/2020 1003   LEUKOCYTESUR LARGE (A) 10/03/2020 1003    STUDIES: MM Breast Surgical Specimen  Result Date: 01/02/2021 CLINICAL DATA:  Status post 2 site seed localization of the RIGHT breast. EXAM: SPECIMEN RADIOGRAPH OF THE RIGHT BREAST COMPARISON:  Previous exam(s). FINDINGS: Status post excision of the RIGHT ribbon shaped breast. The radioactive seed and biopsy marker clip are present, completely intact, and were marked for pathology. In a separate specimen, the seed and coil shaped clip are present and intact.  Findings are discussed with the operating room  nurse at the time of interpretation. IMPRESSION: Radiograph of 2 specimens in the RIGHT breast. Electronically Signed   By: Nolon Nations M.D.   On: 01/02/2021 11:19  MM Breast Surgical Specimen  Result Date: 01/02/2021 CLINICAL DATA:  Status post 2 site seed localization of the RIGHT breast. EXAM: SPECIMEN RADIOGRAPH OF THE RIGHT BREAST COMPARISON:  Previous exam(s). FINDINGS: Status post excision of the RIGHT ribbon shaped breast. The radioactive seed and biopsy marker clip are present, completely intact, and were marked for pathology. In a separate specimen, the seed and coil shaped clip are present and intact. Findings are discussed with the operating room nurse at the time of interpretation. IMPRESSION: Radiograph of 2 specimens in the RIGHT breast. Electronically Signed   By: Nolon Nations M.D.   On: 01/02/2021 11:19  MM RT RADIOACTIVE SEED LOC MAMMO GUIDE  Result Date: 01/01/2021 CLINICAL DATA:  55 year old female with history of invasive mammary carcinoma of the right breast x 2. Patient presents for seed localization. EXAM: MAMMOGRAPHIC GUIDED RADIOACTIVE SEED LOCALIZATION OF THE RIGHT BREAST MAMMOGRAPHIC GUIDED RADIOACTIVE SEED LOCALIZATION OF THE RIGHT BREAST COMPARISON:  Previous exam(s). FINDINGS: Patient presents for radioactive seed localization prior to . I met with the patient and we discussed the procedure of seed localization including benefits and alternatives. We discussed the high likelihood of a successful procedure. We discussed the risks of the procedure including infection, bleeding, tissue injury and further surgery. We discussed the low dose of radioactivity involved in the procedure. Informed, written consent was given. The usual time-out protocol was performed immediately prior to the procedure. 1. Using mammographic guidance, sterile technique, 1% lidocaine and an I-125 radioactive seed, the coil biopsy marking clip was localized using a lateral approach. The follow-up  mammogram images confirm the seed in the expected location and were marked for Dr. Marlou Starks. Follow-up survey of the patient confirms presence of the radioactive seed. Order number of I-125 seed:  [id number]. Total activity: 0.261 mCi reference Date: 24 Nov 2020 2. Using mammographic guidance, sterile technique, 1% lidocaine and an I-125 radioactive seed, the ribbon biopsy marking clip was localized using a lateral approach. The follow-up mammogram images confirm the seed in the expected location and were marked for Dr. Marlou Starks. Follow-up survey of the patient confirms presence of the radioactive seed. Order number of I-125 seed:  [id number]. Total activity: 0.260 mCi reference Date: 28 Nov 2020 The patient tolerated the procedure well and was released from the [facility]. She was given instructions regarding seed removal. IMPRESSION: Radioactive seed localization right breast. No apparent complications. Electronically Signed   By: Audie Pinto M.D.   On: 01/01/2021 14:26  MM RT RADIO SEED EA ADD LESION LOC MAMMO  Result Date: 01/01/2021 CLINICAL DATA:  55 year old female with history of invasive mammary carcinoma of the right breast x 2. Patient presents for seed localization. EXAM: MAMMOGRAPHIC GUIDED RADIOACTIVE SEED LOCALIZATION OF THE RIGHT BREAST MAMMOGRAPHIC GUIDED RADIOACTIVE SEED LOCALIZATION OF THE RIGHT BREAST COMPARISON:  Previous exam(s). FINDINGS: Patient presents for radioactive seed localization prior to . I met with the patient and we discussed the procedure of seed localization including benefits and alternatives. We discussed the high likelihood of a successful procedure. We discussed the risks of the procedure including infection, bleeding, tissue injury and further surgery. We discussed the low dose of radioactivity involved in the procedure. Informed, written consent was given. The usual time-out protocol was performed immediately prior to the procedure. 1. Using  mammographic guidance,  sterile technique, 1% lidocaine and an I-125 radioactive seed, the coil biopsy marking clip was localized using a lateral approach. The follow-up mammogram images confirm the seed in the expected location and were marked for Dr. Marlou Starks. Follow-up survey of the patient confirms presence of the radioactive seed. Order number of I-125 seed:  [id number]. Total activity: 0.261 mCi reference Date: 24 Nov 2020 2. Using mammographic guidance, sterile technique, 1% lidocaine and an I-125 radioactive seed, the ribbon biopsy marking clip was localized using a lateral approach. The follow-up mammogram images confirm the seed in the expected location and were marked for Dr. Marlou Starks. Follow-up survey of the patient confirms presence of the radioactive seed. Order number of I-125 seed:  [id number]. Total activity: 0.260 mCi reference Date: 28 Nov 2020 The patient tolerated the procedure well and was released from the [facility]. She was given instructions regarding seed removal. IMPRESSION: Radioactive seed localization right breast. No apparent complications. Electronically Signed   By: Audie Pinto M.D.   On: 01/01/2021 14:26     ELIGIBLE FOR AVAILABLE RESEARCH PROTOCOL: No  ASSESSMENT: 55 y.o. [place], [place] woman  LEFT BREAST ATYPICAL LOBULAR HYPERPLASIA (1) status post left lumpectomy 03/22/2016 showing atypical lobular hyperplasia  (2) prophylactic tamoxifen taken from 04/2016 through 03/2019  (a) status post hysterectomy (without bilateral salpingo-oophorectomy) February 2021  (3) genetic testing 05/22/2019 through the my risk hereditary cancer panel offered by [facility] found no deleterious mutations in BRCA, TP53, PALB2 or any of the other genes tested  (a) variants of uncertain significance were found in BRIP1 and NBN  RIGHT BREAST CANCER: METASTATIC DISEASE June 2022 (4) right breast lower outer quadrant biopsy 06/25/2020 shows a clinical T2 N1, stage IB invasive ductal carcinoma, grade 2 or 3, triple  positive, with an MIB-1 of 40%.  (A) breast MRI 07/07/2020 shows mcT2 N1 disease as well as an enhancing lesion in the sternum (B) non-contrast Chest CT scan 07/23/2020 shows right axillary and retropectoral adenopathy, also several liver hypodensities and very small lytic/sclerotic bone lesions  (C) bone scan 07/25/2020 shows abnormal uptake at T8, T10, midline lower cervical spine, superior LEFT manubrium and posterior LEFT ninth rib  (D) abdominal MRI with and w/o conttrast 07/26/2020 shows a right hepatic 1.2 cm indeterminate lesion, no other lesions of concern in the liver; bone lesions re-demonstrated, largetst at T10  (E) PET scan 08/01/2020 shows no hypermetabolism in the 1.2 cm right liver lesion; bony hypermetabolism consistent with known mets noted  (F) biopsy T10 to be performed at the end of chemotherapy  (G) brain MRI 08/12/2020 negative (H) CA 27-29 on 08/19/2020 NONINFORMATIVE  (5) neoadjuvant chemotherapy consisting of docetaxel, carboplatin, trastuzumab and pertuzumab every 21 days x 4 cycles started 07/28/2020, last chemotherapy 09/30/2020  (6) anti-HER2 treatment to be continued indefinitely  (a) echocardiogram on 07/03/2020 shows EF of 55-60%  (b) echo 10/07/2020 shows an ejection fraction in the 60-65% range  (C) echo 01/21/2021  (7) no adjuvant radiation planned (discussed 10/22/2020 conference)  (8) zoledronate started 08/19/2020, to be repeated every 12 weeks  (9) restaging studies:  (A) MRI of the brain with and without contrast 08/12/2020 benign  (B) total spinal MRI 10/07/2020 shows stable to improved disease, no extraosseous extension, pathologic fracture, epidural or intracanalicular involvement  (C) MRI of the breast 10/19/2020 shows a complete imaging response in the breast, with new ring-enhancing lesions in the left breast with MRI guided biopsy 11/10/2020 showing no evidence of malignancy  (10) right lumpectomy with no  sentinel lymph node sampling  01/02/2021 showed scattered foci of residual invasive ductal carcinoma, grade 2, the largest measuring 0.8 cm.  Margins were negative.  (11) anastrozole started 01/12/2021  (A) bone density 05/22/2019 showed a T score of -1.4  (B) consider adding abemaciclib   PLAN: Kristi did remarkably well with the surgery.  We reviewed the results today and she understands that there is no residual tumor left in the breast to the best of our ability to tell.  We previously discussed her case and decided that she would not need adjuvant radiation since she has metastatic disease.  On the other hand we will obtain yearly mammography with breast ultrasonography on the right to make sure there is no local recurrence of her disease  She is tolerating trastuzumab and pertuzumab well and we will continue that every 3 weeks for at least a year.  After a year we can consider going to every 4 weeks and we also can consider perhaps discontinuing the epratuzumab.  We discussed abemaciclib and this can be added at some point but at this time I think simply starting with the anastrozole and follow-up may be adequate.  We reviewed anastrozole side effects and complications and I am putting the order through mail order facility for her.  I will see her in 3 weeks just to make sure she is tolerating it well and I suggested she take gabapentin at bedtime as prescribed.  I also suggested that she walk on a regular basis and we will review that again when she sees me again in 3 weeks.  Total encounter time 35 minutes.Sarajane Jews C. Augustus Zurawski, MD 01/12/21 9:08 AM Medical Oncology and Hematology [facility] [place], [place] [address] Tel. [phone]    Fax. [phone]   I, Wilburn Mylar, am acting as scribe for Dr. Virgie Dad. Vinod Mikesell.  I, Lurline Del MD, have reviewed the above documentation for accuracy and completeness, and I agree with the above.   *Total Encounter Time as  defined by the Centers for Medicare and Medicaid Services includes, in addition to the face-to-face time of a patient visit (documented in the note above) non-face-to-face time: obtaining and reviewing outside history, ordering and reviewing medications, tests or procedures, care coordination (communications with other health care professionals or caregivers) and documentation in the medical record.

## 2021-01-12 ENCOUNTER — Other Ambulatory Visit: Payer: 59

## 2021-01-12 ENCOUNTER — Inpatient Hospital Stay: Payer: 59

## 2021-01-12 ENCOUNTER — Inpatient Hospital Stay (HOSPITAL_BASED_OUTPATIENT_CLINIC_OR_DEPARTMENT_OTHER): Payer: 59 | Admitting: Oncology

## 2021-01-12 ENCOUNTER — Other Ambulatory Visit: Payer: Self-pay

## 2021-01-12 VITALS — BP 116/61 | HR 63 | Temp 97.9°F | Resp 16 | Ht 67.0 in | Wt 173.9 lb

## 2021-01-12 DIAGNOSIS — C50511 Malignant neoplasm of lower-outer quadrant of right female breast: Secondary | ICD-10-CM

## 2021-01-12 DIAGNOSIS — Z170 Estrogen receptor positive status [ER+]: Secondary | ICD-10-CM

## 2021-01-12 DIAGNOSIS — C7951 Secondary malignant neoplasm of bone: Secondary | ICD-10-CM

## 2021-01-12 DIAGNOSIS — Z7189 Other specified counseling: Secondary | ICD-10-CM

## 2021-01-12 DIAGNOSIS — Z5112 Encounter for antineoplastic immunotherapy: Secondary | ICD-10-CM | POA: Diagnosis not present

## 2021-01-12 LAB — CBC WITH DIFFERENTIAL/PLATELET
Abs Immature Granulocytes: 0.02 10*3/uL (ref 0.00–0.07)
Basophils Absolute: 0 10*3/uL (ref 0.0–0.1)
Basophils Relative: 1 %
Eosinophils Absolute: 0.2 10*3/uL (ref 0.0–0.5)
Eosinophils Relative: 3 %
HCT: 40.7 % (ref 36.0–46.0)
Hemoglobin: 13.6 g/dL (ref 12.0–15.0)
Immature Granulocytes: 0 %
Lymphocytes Relative: 41 %
Lymphs Abs: 2 10*3/uL (ref 0.7–4.0)
MCH: 31.6 pg (ref 26.0–34.0)
MCHC: 33.4 g/dL (ref 30.0–36.0)
MCV: 94.7 fL (ref 80.0–100.0)
Monocytes Absolute: 0.3 10*3/uL (ref 0.1–1.0)
Monocytes Relative: 7 %
Neutro Abs: 2.3 10*3/uL (ref 1.7–7.7)
Neutrophils Relative %: 48 %
Platelets: 196 10*3/uL (ref 150–400)
RBC: 4.3 MIL/uL (ref 3.87–5.11)
RDW: 11.8 % (ref 11.5–15.5)
WBC: 4.9 10*3/uL (ref 4.0–10.5)
nRBC: 0 % (ref 0.0–0.2)

## 2021-01-12 LAB — COMPREHENSIVE METABOLIC PANEL
ALT: 35 U/L (ref 0–44)
AST: 26 U/L (ref 15–41)
Albumin: 3.9 g/dL (ref 3.5–5.0)
Alkaline Phosphatase: 86 U/L (ref 38–126)
Anion gap: 11 (ref 5–15)
BUN: 14 mg/dL (ref 6–20)
CO2: 25 mmol/L (ref 22–32)
Calcium: 9.3 mg/dL (ref 8.9–10.3)
Chloride: 108 mmol/L (ref 98–111)
Creatinine, Ser: 0.8 mg/dL (ref 0.44–1.00)
GFR, Estimated: >60 mL/min (ref >60)
Glucose, Bld: 84 mg/dL (ref 70–99)
Potassium: 3.9 mmol/L (ref 3.5–5.1)
Sodium: 144 mmol/L (ref 135–145)
Total Bilirubin: 0.3 mg/dL (ref 0.3–1.2)
Total Protein: 6.9 g/dL (ref 6.5–8.1)

## 2021-01-12 MED ORDER — SODIUM CHLORIDE 0.9% FLUSH
10.0000 mL | INTRAVENOUS | Status: DC | PRN
  Administered 2021-01-12: 12:00:00 10 mL

## 2021-01-12 MED ORDER — ANASTROZOLE 1 MG PO TABS: 1.0000 mg | ORAL_TABLET | Freq: Every day | ORAL | 4 refills | Status: DC

## 2021-01-12 MED ORDER — TRASTUZUMAB-ANNS CHEMO 150 MG IV SOLR
6.0000 mg/kg | Freq: Once | INTRAVENOUS | Status: AC
  Administered 2021-01-12: 10:00:00 462 mg via INTRAVENOUS
  Filled 2021-01-12: qty 22

## 2021-01-12 MED ORDER — DIPHENHYDRAMINE HCL 25 MG PO CAPS
25.0000 mg | ORAL_CAPSULE | Freq: Once | ORAL | Status: AC
  Administered 2021-01-12: 10:00:00 25 mg via ORAL
  Filled 2021-01-12: qty 1

## 2021-01-12 MED ORDER — HEPARIN SOD (PORK) LOCK FLUSH 100 UNIT/ML IV SOLN
500.0000 [IU] | Freq: Once | INTRAVENOUS | Status: AC | PRN
  Administered 2021-01-12: 12:00:00 500 [IU]

## 2021-01-12 MED ORDER — SODIUM CHLORIDE 0.9 % IV SOLN
420.0000 mg | Freq: Once | INTRAVENOUS | Status: AC
  Administered 2021-01-12: 11:00:00 420 mg via INTRAVENOUS
  Filled 2021-01-12: qty 14

## 2021-01-12 MED ORDER — SODIUM CHLORIDE 0.9 % IV SOLN: Freq: Once | INTRAVENOUS | Status: AC

## 2021-01-12 MED ORDER — ACETAMINOPHEN 325 MG PO TABS
650.0000 mg | ORAL_TABLET | Freq: Once | ORAL | Status: AC
  Administered 2021-01-12: 10:00:00 650 mg via ORAL
  Filled 2021-01-12: qty 2

## 2021-01-12 NOTE — Patient Instructions (Signed)
[facility]  Discharge Instructions: Thank you for choosing [facility] to provide your oncology and hematology care.   If you have a lab appointment with the [facility], please go directly to the [facility] and check in at the registration area.   Wear comfortable clothing and clothing appropriate for easy access to any Portacath or PICC line.   We strive to give you quality time with your provider. You may need to reschedule your appointment if you arrive late (15 or more minutes).  Arriving late affects you and other patients whose appointments are after yours.  Also, if you miss three or more appointments without notifying the office, you may be dismissed from the clinic at the provider's discretion.      For prescription refill requests, have your pharmacy contact our office and allow 72 hours for refills to be completed.    Today you received the following chemotherapy and/or immunotherapy agents trastuzumab and Perjeta      To help prevent nausea and vomiting after your treatment, we encourage you to take your nausea medication as directed.  BELOW ARE SYMPTOMS THAT SHOULD BE REPORTED IMMEDIATELY: *FEVER GREATER THAN 100.4 F (38 C) OR HIGHER *CHILLS OR SWEATING *NAUSEA AND VOMITING THAT IS NOT CONTROLLED WITH YOUR NAUSEA MEDICATION *UNUSUAL SHORTNESS OF BREATH *UNUSUAL BRUISING OR BLEEDING *URINARY PROBLEMS (pain or burning when urinating, or frequent urination) *BOWEL PROBLEMS (unusual diarrhea, constipation, pain near the anus) TENDERNESS IN MOUTH AND THROAT WITH OR WITHOUT PRESENCE OF ULCERS (sore throat, sores in mouth, or a toothache) UNUSUAL RASH, SWELLING OR PAIN  UNUSUAL VAGINAL DISCHARGE OR ITCHING   Items with * indicate a potential emergency and should be followed up as soon as possible or go to the Emergency Department if any problems should occur.  Please show the CHEMOTHERAPY ALERT CARD or IMMUNOTHERAPY ALERT CARD at  check-in to the Emergency Department and triage nurse.  Should you have questions after your visit or need to cancel or reschedule your appointment, please contact [facility]  Dept: [phone]  and follow the prompts.  Office hours are 8:00 a.m. to 4:30 p.m. Monday - Friday. Please note that voicemails left after 4:00 p.m. may not be returned until the following business day.  We are closed weekends and major holidays. You have access to a nurse at all times for urgent questions. Please call the main number to the clinic Dept: [phone] and follow the prompts.   For any non-urgent questions, you may also contact your provider using MyChart. We now offer e-Visits for anyone 21 and older to request care online for non-urgent symptoms. For details visit [email or web address].   Also download the MyChart app! Go to the app store, search "MyChart", open the app, select [facility], and log in with your MyChart username and password.  Due to Covid, a mask is required upon entering the hospital/clinic. If you do not have a mask, one will be given to you upon arrival. For doctor visits, patients may have 1 support person aged 56 or older with them. For treatment visits, patients cannot have anyone with them due to current Covid guidelines and our immunocompromised population.

## 2021-01-12 NOTE — Progress Notes (Signed)
Last echo 10/07/20.  Ok to proceed with treatment per MD.

## 2021-01-13 ENCOUNTER — Encounter: Payer: Self-pay | Admitting: *Deleted

## 2021-01-21 ENCOUNTER — Other Ambulatory Visit: Payer: Self-pay

## 2021-01-21 ENCOUNTER — Ambulatory Visit (HOSPITAL_COMMUNITY): Payer: 59 | Attending: Oncology

## 2021-01-21 DIAGNOSIS — Z0189 Encounter for other specified special examinations: Secondary | ICD-10-CM

## 2021-01-21 DIAGNOSIS — Z7189 Other specified counseling: Secondary | ICD-10-CM | POA: Diagnosis present

## 2021-01-21 DIAGNOSIS — C7951 Secondary malignant neoplasm of bone: Secondary | ICD-10-CM

## 2021-01-21 DIAGNOSIS — Z170 Estrogen receptor positive status [ER+]: Secondary | ICD-10-CM | POA: Diagnosis present

## 2021-01-21 DIAGNOSIS — C50511 Malignant neoplasm of lower-outer quadrant of right female breast: Secondary | ICD-10-CM | POA: Diagnosis not present

## 2021-01-21 LAB — ECHOCARDIOGRAM COMPLETE
Area-P 1/2: 3.53 cm2
S' Lateral: 3.2 cm

## 2021-01-22 NOTE — Progress Notes (Signed)
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  Past/Anticipated interventions by surgeon, if any:  Procedure(s): RIGHT BREAST LUMPECTOMY WITH RADIOACTIVE SEED LOCALIZATION X2 (Right)   SURGEON:      Jovita Kussmaul, MD  Past/Anticipated interventions by medical oncology, if any: Dr [name] CURRENT TREATMENT: anti-HER2 immunotherapy; zometa previously seen in the high risk breast cancer clinic on 05/11/2016. She had undergone left lumpectomy on 03/22/2016 showing atypical lobular hyperplasia. She was prescribed tamoxifen at that time, which she believes she took for two years  Lymphedema issues, if any:  no     Pain issues, if any:  no   SAFETY ISSUES: Prior radiation? no Pacemaker/ICD? no Possible current pregnancy?no, hysterectomy Is the patient on methotrexate? no  Current Complaints / other details:  none    Vitals:   01/28/21 1341  BP: 114/69  Pulse: 71  Resp: 18  Temp: 98.7 F (37.1 C)  SpO2: 98%  Weight: 173 lb 12.8 oz (78.8 kg)  Height: '5\' 7"'  (1.702 m)

## 2021-01-26 ENCOUNTER — Ambulatory Visit: Payer: 59 | Admitting: Physical Therapy

## 2021-01-27 NOTE — Progress Notes (Signed)
Radiation Oncology         [phone] ________________________________  Name: Kim Archer MRN: [id number]  Date: 01/28/2021  DOB: 1965-10-08  Re-Evaluation Note  CC: [name], MD  Magrinat, Virgie Dad, MD    ICD-10-CM   1. Malignant neoplasm of lower-outer quadrant of right breast of female, estrogen receptor positive ([place])  C50.511    Z17.0     2. Bone metastases ([facility])  C79.51       Diagnosis:     Stage IV (cT2, cN1, cM1) Right Breast LOQ, Invasive Ductal Carcinoma, ER+ / PR+ / Her2+, Grade 2    Narrative:  The patient returns today to discuss radiation treatment options. She was seen in the multidisciplinary breast clinic on 07/02/20.   Right breast needle core biopsy at the 9 o'clock position performed on 07/11/20 revealed grade 2 invasive mammary carcinoma measuring 7 mm in the greatest linear extent.  Chest CT on 07/23/20 revealed evidence of bony metastatic disease. This was confirmed by whole body bone scan on 07/23/20 which showed abnormal foci of uptake within T8, T10, midline lower cervical spine, superior left manubrium, and posterior left ninth rib suspicious for osseous metastases. (Imaging is further detailed below).   She has been treated with neoadjuvant chemotherapy consisting of docetaxel, carboplatin, trastuzumab, and pertuzumab on 07/28/2020 through 09/30/2020 under the care of Dr. [name]. . Per Dr. 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 During  follow-up with Dr. 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  She was also started on Zometa.  Bilateral breast MRI on 10/19/20 to assess response to neo-adjuvant chemo revealed a new ring-enhancing mass within the posterior left breast, inner retroareolar region, measuring 1 cm. Mass was noted to possibly represent a benign breast cyst with pericystic inflammation, but new contralateral  disease could not be excluded. An additional irregular enhancing mass was appreciated within the posterior left breast, slightly outer, measuring 5 mm. Otherwise, the previously demonstrated enhancing masses in the right breast were no longer visualized, consistent with a complete imaging response to interval therapy. No abnormal appearing lymph nodes were appreciated, however, the upper right axilla was incompletely imaged. This prompted a right axillary ultrasound on 10/29/20 which revealed no sonographic evidence of right axillary lymphadenopathy.   Subsequently, the patient underwent an additional left breast posterior UIQ biopsy on 11/10/20 which revealed fibrocystic changes with usual ductal hyperplasia and calcifications. No evidence of malignancy was identified.   She opted to proceed with right breat lumpectomy on 01/02/21 under the care of Dr. Marlou Starks. Pathology from the procedure revealed:  --Right inferior/medial lumpectomy: grade 2  invasive ductal carcinoma; resection margins negative for carcinoma. Prognostic indicators significant for:  ER 40% positive, PR >95% positive, both with strong staining intensity; Her2 status positive; proliferation marker Ki67 at 40%; Grade 2.  --Right breast lateral/superior lumpectomy: foci of grade 2 invasive ductal carcinoma; resection margins negative for carcinoma, with the closest margin being 1.5 mm to the inferior margin.  --Right additional medial margin from inferior/medial tissue excision: small foci of invasive ductal carcinoma with fibrocystic changes; new medial margin located 0.4 cm from carcinoma.   Other pertinent imaging since the patient was last seen for consultation is as follows:  --Bilateral breast MRI on 07/07/20 showed the irregularly enhancing mass in the lower outer quadrant of the right breast measuring 2.9 x 1.9 x 1.9 cm, as well as the second suspicious right breast enhancing  mass in the posterolateral breast, above the index  malignancy, measuring 1.5 cm. Three abnormal appearing level 1 right axillary lymph nodes were also seen, 1 of which, (the largest), reflected the biopsy-proven metastatic lymph node. Lastly, a 5 mm enhancing, T2 signal hyperintense well-defined lesion was appreciated in the left upper sternum, noted as nonspecific though possibly reflective of metastatic disease.  --Right breast axilllary [place] on 07/11/20 revealed a suspicious mass in the 9 o'clock region of the right breast.  --Chest CT for staging on 07/23/20 revealed right axillary and retropectoral adenopathy with evidence of prior biopsy in the right breast and right axilla. CT also showed findings suggestive of bony metastatic disease and potential hepatic metastatic disease.  --Whole body bone scan on 07/23/20 showed abnormal foci of uptake within T8, T10, midline lower cervical spine, superior left manubrium, and posterior left ninth rib suspicious for osseous metastases.  --MRI of the liver on 07/26/20 revealed the lesion of concern in the dome of the right hepatic lobe on recent chest CT as indeterminate, with low T2 hyperintensity, homogeneous arterial phase enhancement (and no washout on the delayed Images). Liver lesion was noted to possibly reflect an hepatic adenoma or atypical Hemangioma. Multiple metastases within the thoracolumbar spine and visualized bony pelvis were also again appreciated.  --PET on 08/01/20 demonstrated the hypermetabolic lateral right breast lesion with hypermetabolic lymph nodes in the right axilla consistent with metastatic disease. No other evidence for soft tissue metastases was appreciated.  Specifically, the lesion in the dome of the liver showed no hypermetabolism. However, relatively widespread nodular hypermetabolic uptake in the bony anatomy was again seen, consistent with metastatic involvement.  --MRI of the brain on 08/12/20 showed no evidence of metastatic disease to the brain. --Total spine MRI for  metastasis screening on 10/07/20 revealed scattered osseous metastatic disease involving the thoracolumbar spine as somewhat less pronounced compared to previous abdominal MRI from 07/26/2020, (showing possible improvement). Most prominent of these lesions was seen to measure 2 cm at the T10 vertebral body. No extra osseous extension of tumor, pathologic fracture, or other complications were appreciated, or epidural or intracanalicular involvement. Mild multilevel degenerative spondylosis was also seen, though without significant spinal stenosis or neural impingement.    On review of systems, the patient reports occasional pain in the mid back when standing a lot.  She denies any numbness or weakness in her lower extremities.  She denies any pain within the right or left breast area nipple discharge or bleeding.. She denies headaches or visual difficulties and any other symptoms.    Allergies:  is allergic to ciprofloxacin and sulfa antibiotics.  Meds: Current Outpatient Medications  Medication Sig Dispense Refill   Boron 3 MG CAPS Take by mouth.     Calcium 500 MG tablet 1,000 mg.     ibuprofen (ADVIL) 800 MG tablet ibuprofen 800 mg tablet  TAKE 1 TABLET BY MOUTH THREE TIMES A DAY     ipratropium (ATROVENT) 0.06 % nasal spray Place into both nostrils.     levOCARNitine (CARNITINE PO) Take 1 capsule by mouth daily. L-Carnitine     lidocaine-prilocaine (EMLA) cream Apply topically daily.     loratadine (CLARITIN) 10 MG tablet Take 10 mg by mouth daily.     LUTEIN PO Take by mouth.     MAGNESIUM PO Take 1 tablet by mouth at bedtime.     Menatetrenone (VITAMIN K2) 100 MCG TABS Take by mouth.     Multiple Vitamin (MULTIVITAMIN WITH MINERALS) TABS tablet Take  1 tablet by mouth daily.     OVER THE COUNTER MEDICATION Take 1 capsule by mouth daily. [place] Mushroom 1284m     tobramycin-dexamethasone (TOBRADEX) ophthalmic solution PLACE 1 DROP INTO BOTH EYES IN THE MORNING AND AT BEDTIME. 5 mL 0    Ubiquinol 50 MG CAPS Take by mouth daily.     VITAMIN D PO Vitamin D     zinc sulfate 220 (50 Zn) MG capsule Take 220 mg by mouth daily.     anastrozole (ARIMIDEX) 1 MG tablet Take 1 tablet (1 mg total) by mouth daily. (Patient not taking: Reported on 01/28/2021) 90 tablet 4   benzonatate (TESSALON) 200 MG capsule Take 1 capsule (200 mg total) by mouth 3 (three) times daily as needed for cough. (Patient not taking: Reported on 01/28/2021) 20 capsule 0   gabapentin (NEURONTIN) 100 MG capsule Take 1 capsule (100 mg total) by mouth at bedtime. (Patient not taking: Reported on 01/28/2021) 90 capsule 4   valACYclovir (VALTREX) 500 MG tablet  (Patient not taking: Reported on 01/28/2021)  12   No current facility-administered medications for this encounter.    Physical Findings: The patient is in no acute distress. Patient is alert and oriented.  height is _0  (1.702 m) and weight is 173 lb 12.8 oz (78.8 kg). Her temperature is 98.7 F (37.1 C). Her blood pressure is 114/69 and her pulse is 71. Her respiration is 18 and oxygen saturation is 98%.  Lungs are clear to auscultation bilaterally. Heart has regular rate and rhythm. No palpable cervical, supraclavicular, or axillary adenopathy. Abdomen soft, non-tender, normal bowel sounds. Left breast: no palpable mass, nipple discharge or bleeding. Right breast: Well-healing scar in the lower outer aspect of the breast area.  No signs of infection or drainage.  No dominant mass appreciated in the breast nipple discharge or bleeding.  Lab Findings: Lab Results  Component Value Date   WBC 4.9 01/12/2021   HGB 13.6 01/12/2021   HCT 40.7 01/12/2021   MCV 94.7 01/12/2021   PLT 196 01/12/2021    Radiographic Findings: MM Breast Surgical Specimen  Result Date: 01/02/2021 CLINICAL DATA:  Status post 2 site seed localization of the RIGHT breast. EXAM: SPECIMEN RADIOGRAPH OF THE RIGHT BREAST COMPARISON:  Previous exam(s). FINDINGS: Status post excision of  the RIGHT ribbon shaped breast. The radioactive seed and biopsy marker clip are present, completely intact, and were marked for pathology. In a separate specimen, the seed and coil shaped clip are present and intact. Findings are discussed with the operating room nurse at the time of interpretation. IMPRESSION: Radiograph of 2 specimens in the RIGHT breast. Electronically Signed   By: ENolon NationsM.D.   On: 01/02/2021 11:19  MM Breast Surgical Specimen  Result Date: 01/02/2021 CLINICAL DATA:  Status post 2 site seed localization of the RIGHT breast. EXAM: SPECIMEN RADIOGRAPH OF THE RIGHT BREAST COMPARISON:  Previous exam(s). FINDINGS: Status post excision of the RIGHT ribbon shaped breast. The radioactive seed and biopsy marker clip are present, completely intact, and were marked for pathology. In a separate specimen, the seed and coil shaped clip are present and intact. Findings are discussed with the operating room nurse at the time of interpretation. IMPRESSION: Radiograph of 2 specimens in the RIGHT breast. Electronically Signed   By: ENolon NationsM.D.   On: 01/02/2021 11:19  ECHOCARDIOGRAM COMPLETE  Result Date: 01/21/2021    ECHOCARDIOGRAM REPORT   Patient Name:   Kim SANDLERDate of Exam:  01/21/2021 Medical Rec #:  [id number]         Height:       67.0 in Accession #:    [id number]        Weight:       173.9 lb Date of Birth:  [birth year]         BSA:          1.906 m Patient Age:    62 years          BP:           108/71 mmHg Patient Gender: F                 HR:           64 bpm. Exam Location:  [place] Procedure: 2D Echo, 3D Echo, Cardiac Doppler, Color Doppler and Strain Analysis Indications:    C50.511 Breast cancer  History:        Patient has prior history of Echocardiogram examinations, most                 recent 10/07/2020. Breast cancer. Bone metastasis.  Sonographer:    Basilia Jumbo BS, RDCS Referring Phys: [place]  1. Left ventricular ejection  fraction, by estimation, is 60 to 65%. Left ventricular ejection fraction by 3D volume is 66 %. The left ventricle has normal function. The left ventricle has no regional wall motion abnormalities. Left ventricular diastolic  parameters were normal. The average left ventricular global longitudinal strain is -23.9 %. The global longitudinal strain is normal.  2. Right ventricular systolic function is normal. The right ventricular size is normal. There is normal pulmonary artery systolic pressure. The estimated right ventricular systolic pressure is 20.2 mmHg.  3. The mitral valve is normal in structure. Trivial mitral valve regurgitation. No evidence of mitral stenosis.  4. The aortic valve is tricuspid. Aortic valve regurgitation is not visualized. No aortic stenosis is present.  5. The inferior vena cava is normal in size with greater than 50% respiratory variability, suggesting right atrial pressure of 3 mmHg. Comparison(s): Prior TTE on 10/07/20 with EF 60-65%. GLS -18.1%. No significant change from current study. FINDINGS  Left Ventricle: Left ventricular ejection fraction, by estimation, is 60 to 65%. Left ventricular ejection fraction by 3D volume is 66 %. The left ventricle has normal function. The left ventricle has no regional wall motion abnormalities. The average left ventricular global longitudinal strain is -23.9 %. The global longitudinal strain is normal. The left ventricular internal cavity size was normal in size. There is borderline concentric left ventricular hypertrophy. Left ventricular diastolic parameters were normal. Right Ventricle: The right ventricular size is normal. No increase in right ventricular wall thickness. Right ventricular systolic function is normal. There is normal pulmonary artery systolic pressure. The tricuspid regurgitant velocity is 2.14 m/s, and  with an assumed right atrial pressure of 3 mmHg, the estimated right ventricular systolic pressure is 54.2 mmHg. Left Atrium:  Left atrial size was normal in size. Right Atrium: Right atrial size was normal in size. Pericardium: There is no evidence of pericardial effusion. Mitral Valve: The mitral valve is normal in structure. Trivial mitral valve regurgitation. No evidence of mitral valve stenosis. Tricuspid Valve: The tricuspid valve is normal in structure. Tricuspid valve regurgitation is mild. Aortic Valve: The aortic valve is tricuspid. Aortic valve regurgitation is not visualized. No aortic stenosis is present. Pulmonic Valve: The pulmonic valve was normal in structure. Pulmonic valve regurgitation is  trivial. Aorta: The aortic root and ascending aorta are structurally normal, with no evidence of dilitation. Venous: The inferior vena cava is normal in size with greater than 50% respiratory variability, suggesting right atrial pressure of 3 mmHg. IAS/Shunts: No atrial level shunt detected by color flow Doppler.  LEFT VENTRICLE PLAX 2D LVIDd:         4.70 cm         Diastology LVIDs:         3.20 cm         LV e' medial:    7.53 cm/s LV PW:         0.90 cm         LV E/e' medial:  9.9 LV IVS:        0.90 cm         LV e' lateral:   12.60 cm/s LVOT diam:     2.00 cm         LV E/e' lateral: 5.9 LV SV:         81 LV SV Index:   43              2D LVOT Area:     3.14 cm        Longitudinal                                Strain                                2D Strain GLS  -24.9 %                                (A2C):                                2D Strain GLS  -21.9 %                                (A3C):                                2D Strain GLS  -25.0 %                                (A4C):                                2D Strain GLS  -23.9 %                                Avg:                                 3D Volume EF                                LV 3D EF:    Left  ventricul                                             ar                                             ejection                                              fraction                                             by 3D                                             volume is                                             66 %.                                 3D Volume EF:                                3D EF:        66 %                                LV EDV:       124 ml                                LV ESV:       42 ml                                LV SV:        82 ml RIGHT VENTRICLE             IVC RV Basal diam:  3.80 cm     IVC diam: 1.30 cm RV S prime:     13.80 cm/s TAPSE (M-mode): 1.8 cm RVSP:           21.3 mmHg LEFT ATRIUM             Index        RIGHT ATRIUM           Index LA diam:        4.10 cm 2.15 cm/m   RA Pressure: 3.00 mmHg LA Vol (A2C):   56.9 ml 29.86 ml/m  RA Area:  16.70 cm LA Vol (A4C):   38.2 ml 20.05 ml/m  RA Volume:   56.20 ml  29.49 ml/m LA Biplane Vol: 48.1 ml 25.24 ml/m  AORTIC VALVE LVOT Vmax:   118.00 cm/s LVOT Vmean:  77.600 cm/s LVOT VTI:    0.258 m  AORTA Ao Root diam: 3.00 cm Ao Asc diam:  3.20 cm MITRAL VALVE               TRICUSPID VALVE                            TR Peak grad:   18.3 mmHg MV Decel Time: 215 msec    TR Vmax:        214.00 cm/s MV E velocity: 74.70 cm/s  Estimated RAP:  3.00 mmHg MV A velocity: 62.80 cm/s  RVSP:           21.3 mmHg MV E/A ratio:  1.19                            SHUNTS                            Systemic VTI:  0.26 m                            Systemic Diam: 2.00 cm Gwyndolyn Kaufman MD Electronically signed by Gwyndolyn Kaufman MD Signature Date/Time: 01/21/2021/11:15:33 AM    Final    MM RT RADIOACTIVE SEED LOC MAMMO GUIDE  Result Date: 01/01/2021 CLINICAL DATA:  55 year old female with history of invasive mammary carcinoma of the right breast x 2. Patient presents for seed localization. EXAM: MAMMOGRAPHIC GUIDED RADIOACTIVE SEED LOCALIZATION OF THE RIGHT BREAST MAMMOGRAPHIC GUIDED RADIOACTIVE SEED LOCALIZATION OF THE RIGHT BREAST COMPARISON:  Previous  exam(s). FINDINGS: Patient presents for radioactive seed localization prior to . I met with the patient and we discussed the procedure of seed localization including benefits and alternatives. We discussed the high likelihood of a successful procedure. We discussed the risks of the procedure including infection, bleeding, tissue injury and further surgery. We discussed the low dose of radioactivity involved in the procedure. Informed, written consent was given. The usual time-out protocol was performed immediately prior to the procedure. 1. Using mammographic guidance, sterile technique, 1% lidocaine and an I-125 radioactive seed, the coil biopsy marking clip was localized using a lateral approach. The follow-up mammogram images confirm the seed in the expected location and were marked for Dr. Marlou Starks. Follow-up survey of the patient confirms presence of the radioactive seed. Order number of I-125 seed:  [id number]. Total activity: 0.261 mCi reference Date: 24 Nov 2020 2. Using mammographic guidance, sterile technique, 1% lidocaine and an I-125 radioactive seed, the ribbon biopsy marking clip was localized using a lateral approach. The follow-up mammogram images confirm the seed in the expected location and were marked for Dr. Marlou Starks. Follow-up survey of the patient confirms presence of the radioactive seed. Order number of I-125 seed:  [id number]. Total activity: 0.260 mCi reference Date: 28 Nov 2020 The patient tolerated the procedure well and was released from the [facility]. She was given instructions regarding seed removal. IMPRESSION: Radioactive seed localization right breast. No apparent complications. Electronically Signed   By: Audie Pinto M.D.   On: 01/01/2021 14:26  MM RT RADIO SEED EA ADD  LESION LOC MAMMO  Result Date: 01/01/2021 CLINICAL DATA:  55 year old female with history of invasive mammary carcinoma of the right breast x 2. Patient presents for seed localization. EXAM: MAMMOGRAPHIC GUIDED  RADIOACTIVE SEED LOCALIZATION OF THE RIGHT BREAST MAMMOGRAPHIC GUIDED RADIOACTIVE SEED LOCALIZATION OF THE RIGHT BREAST COMPARISON:  Previous exam(s). FINDINGS: Patient presents for radioactive seed localization prior to . I met with the patient and we discussed the procedure of seed localization including benefits and alternatives. We discussed the high likelihood of a successful procedure. We discussed the risks of the procedure including infection, bleeding, tissue injury and further surgery. We discussed the low dose of radioactivity involved in the procedure. Informed, written consent was given. The usual time-out protocol was performed immediately prior to the procedure. 1. Using mammographic guidance, sterile technique, 1% lidocaine and an I-125 radioactive seed, the coil biopsy marking clip was localized using a lateral approach. The follow-up mammogram images confirm the seed in the expected location and were marked for Dr. Marlou Starks. Follow-up survey of the patient confirms presence of the radioactive seed. Order number of I-125 seed:  [id number]. Total activity: 0.261 mCi reference Date: 24 Nov 2020 2. Using mammographic guidance, sterile technique, 1% lidocaine and an I-125 radioactive seed, the ribbon biopsy marking clip was localized using a lateral approach. The follow-up mammogram images confirm the seed in the expected location and were marked for Dr. Marlou Starks. Follow-up survey of the patient confirms presence of the radioactive seed. Order number of I-125 seed:  [id number]. Total activity: 0.260 mCi reference Date: 28 Nov 2020 The patient tolerated the procedure well and was released from the [facility]. She was given instructions regarding seed removal. IMPRESSION: Radioactive seed localization right breast. No apparent complications. Electronically Signed   By: Audie Pinto M.D.   On: 01/01/2021 14:26   Impression:  Stage IV (cT2, cN1, cM1) Right Breast LOQ, Invasive Ductal Carcinoma, ER+ / PR+ /  Her2+, Grade 2  At the time of the patient's initial work-up she was found to have stage IV disease with diffuse osseous metastasis but no soft tissue metastasis such as lung, liver or brain.  She has had an excellent response to her neoadjuvant chemotherapy.  We discussed that in the setting of bone only involvement, we would expect an extended course of survival.  The patient's situation was discussed at the multidisciplinary breast conference and given this situation of stage IV disease with bone only involvement, the patient should be considered for breast radiation therapy for local control issues as well as potentially along the axillary region given patient's significant involvement on preoperative MRI.  I discussed these issues in detail with the patient.  We also discussed the general course of radiation therapy with and without axillary radiation therapy.  We discussed potential side effects during treatment as well as long-term toxicities of comprehensive and/or breast radiation therapy only.  I recommended she discuss these issues in detail with Dr. [name] whom she will meet with on December 19.  I have asked the patient to call me if she wishes to proceed with radiation therapy as part of her adjuvant treatment in the setting of stage IV disease, bone only involvement.   Plan: Treatment plan pending input from medical oncology.  -----------------------------------  Blair Promise, PhD, MD  This document serves as a record of services personally performed by Gery Pray, MD. It was created on his behalf by Roney Mans, a trained medical scribe. The creation of this record is based on the  scribe's personal observations and the provider's statements to them. This document has been checked and approved by the attending provider.

## 2021-01-28 ENCOUNTER — Ambulatory Visit
Admission: RE | Admit: 2021-01-28 | Discharge: 2021-01-28 | Disposition: A | Discharge status: Home / Self Care | Payer: 59 | Source: Ambulatory Visit | Attending: Radiation Oncology | Admitting: Radiation Oncology

## 2021-01-28 ENCOUNTER — Encounter: Payer: Self-pay | Admitting: Radiation Oncology

## 2021-01-28 ENCOUNTER — Ambulatory Visit: Payer: 59 | Admitting: Radiation Oncology

## 2021-01-28 ENCOUNTER — Ambulatory Visit: Payer: 59

## 2021-01-28 ENCOUNTER — Other Ambulatory Visit: Payer: Self-pay

## 2021-01-28 VITALS — BP 114/69 | HR 71 | Temp 98.7°F | Resp 18 | Ht 67.0 in | Wt 173.8 lb

## 2021-01-28 DIAGNOSIS — C7951 Secondary malignant neoplasm of bone: Secondary | ICD-10-CM | POA: Insufficient documentation

## 2021-01-28 DIAGNOSIS — C50511 Malignant neoplasm of lower-outer quadrant of right female breast: Secondary | ICD-10-CM | POA: Diagnosis present

## 2021-01-28 DIAGNOSIS — Z170 Estrogen receptor positive status [ER+]: Secondary | ICD-10-CM | POA: Diagnosis not present

## 2021-01-28 DIAGNOSIS — Z79899 Other long term (current) drug therapy: Secondary | ICD-10-CM | POA: Diagnosis not present

## 2021-01-28 NOTE — Progress Notes (Signed)
See MD note for nursing evaluation. °

## 2021-02-01 NOTE — Progress Notes (Addendum)
[facility]  [phone] Fax:([phone]    ID: [name] DOB: 1965-05-22  MR#: [id number]  LOV#:[id number]  Patient Care Team: [name], MD as PCP - General (Obstetrics and Gynecology) [name], MD as Consulting Physician (Obstetrics and Gynecology) Mauro Kaufmann, RN as Oncology Nurse Navigator Rockwell Germany, RN as Oncology Nurse Navigator Jovita Kussmaul, MD as Consulting Physician (General Surgery) Jermia Rigsby, Virgie Dad, MD as Consulting Physician (Oncology) Gery Pray, MD as Consulting Physician (Radiation Oncology) Raina Mina, RPH-CPP (Pharmacist) Larey Dresser, MD as Consulting Physician (Cardiology) Chauncey Cruel, MD OTHER MD:  CHIEF COMPLAINT: Triple positive breast cancer  CURRENT TREATMENT: anti-HER2 immunotherapy; zometa   INTERVAL HISTORY: Kim Archer returns today for follow up and treatment of her triple positive breast cancer.  She continues on anti-HER2 immunotherapy  She was supposed to have started anastrozole and I had put the prescription in at the end of November through mail order, but she has not received it.  Since her last visit, she was referred back to Dr. Sondra Come on 01/28/2021. She wished to discuss her options with me 1 more time but she is very comfortable not receiving adjuvant radiation  She continues on Herceptin and Perjeta.  Prior to treatment she used to have a bowel movement every 3 days.  Now she has a "messy" bowel movement most days.  She does not feel she wants to try Questran to "thickening" of the bowel movement or make it less frequent.  She had a repeat echocardiogram on 01/21/2021 showing an ejection fraction of 60-65%. This is stable from prior in 09/2020.  We began Zometa with cycle 2 on 08/19/2020 with a second dose 11/11/2020.  She has had no side effects from this and has a dose due today  REVIEW OF SYSTEMS: Kim Archer likes her hair which is coming in quite currently.   She has Christmas plans for the whole family to come to her house.  She has had no unusual headaches visual changes cough phlegm production pleurisy shortness of breath or change in bladder habits.  She denies bony pain or aches.  She tells me she tried gabapentin 100 mg at bedtime but that made her feel weird so she has stopped that.  She is aware that magnesium can cause loose bowel movements.  A detailed review of systems today was otherwise stable.  COVID 19 VACCINATION STATUS: not vaccinated; infection 10/2019   HISTORY OF CURRENT ILLNESS: From the original intake note:  Kim Archer "Kim Archer) was previously seen in the high risk breast cancer clinic on 05/11/2016. She had undergone left lumpectomy on 03/22/2016 showing atypical lobular hyperplasia. She was prescribed tamoxifen at that time, which she believes she took for two years. [She underwent hysterectomy with bilateral salpingo-oophorectomy 03/28/2019, pathology showing leiomyomas and adenomyosis, and may have stopped the tamoxifen around that time].  More recently, she had routine screening mammography on 06/11/2020 showing an area of asymmetry in the right breast. She underwent right diagnostic mammography with tomography and right breast ultrasonography at [facility] on 06/20/2020 showing: breast density category C; 2.4 cm right breast mass at 7 o'clock; one abnormal right axillary lymph node measuring 1.5 cm.  Accordingly on 06/25/2020 she proceeded to biopsy of the right breast area in question. The pathology from this procedure ([id number]) showed: invasive mammary carcinoma, e-cadherin positive, grade 2 or 3. Prognostic indicators significant for: estrogen receptor, 40% positive and progesterone receptor, >95% positive, both with strong staining intensity.  Proliferation marker Ki67 at 40%. HER2 positive by immunohistochemistry (3+).  Biopsy of the right axillary lymph node performed at the same time showed mammary  carcinoma, with no residual lymph node tissue present.   Cancer Staging  Malignant neoplasm of lower-outer quadrant of right breast of female, estrogen receptor positive ([facility]) Staging form: Breast, AJCC 8th Edition - Clinical stage from 07/02/2020: Stage IV (cT2, cN1, cM1, G3, ER+, PR+, HER2+) - Signed by Chauncey Cruel, MD on 08/04/2020 Stage prefix: Initial diagnosis Histologic grading system: 3 grade system Laterality: Right Staged by: Pathologist and managing physician Stage used in treatment planning: Yes National guidelines used in treatment planning: Yes Type of national guideline used in treatment planning: NCCN  The patient's subsequent history is as detailed below.   PAST MEDICAL HISTORY: Past Medical History:  Diagnosis Date   Allergy    Anemia    due to heavy periods   Atypical ductal hyperplasia of left breast    History of kidney stones    HSV-2 (herpes simplex virus 2) infection    PONV (postoperative nausea and vomiting)    right breast ca 06/2020   Breast cancer    PAST SURGICAL HISTORY: Past Surgical History:  Procedure Laterality Date   BREAST CYST EXCISION Left 03/22/2016   ALH-BENIGN   BREAST LUMPECTOMY WITH RADIOACTIVE SEED LOCALIZATION Left 03/22/2016   Procedure: LEFT BREAST LUMPECTOMY WITH RADIOACTIVE SEED LOCALIZATION;  Surgeon: Autumn Messing III, MD;  Location: [facility];  Service: General;  Laterality: Left;   BREAST LUMPECTOMY WITH RADIOACTIVE SEED LOCALIZATION Right 01/02/2021   Procedure: RIGHT BREAST LUMPECTOMY WITH RADIOACTIVE SEED LOCALIZATION X2;  Surgeon: Jovita Kussmaul, MD;  Location: [facility];  Service: General;  Laterality: Right;   CESAREAN SECTION     x3   COMBINED HYSTEROSCOPY DIAGNOSTIC / D&C  07/17/2018   EYE SURGERY     lt lens replacement   PORTACATH PLACEMENT Left 07/07/2020   Procedure: INSERTION PORT-A-CATH;  Surgeon: Jovita Kussmaul, MD;  Location: [facility];  Service: General;  Laterality: Left;    TUBAL LIGATION      FAMILY HISTORY: Family History  Problem Relation Age of Onset   Breast cancer Mother    Melanoma Father    Leukemia Sister    Liver cancer Brother        stomach   Breast cancer Maternal Aunt    Colon cancer Neg Hx    Colon polyps Neg Hx    Esophageal cancer Neg Hx    Rectal cancer Neg Hx    Stomach cancer Neg Hx   The patient's father died at age 23. He had a history of melanoma. He has a brother, the patient's uncle, with prostate cancer. The patient's mother died in her 1s from urosepsis. She had a history of breast cancer diagnosed in her 39s. The patient has a brother, with cerebral palsy, who had "stomach cancer" metastatic to the liver. The patient has 2 sisters. One of them died from childhood leukemia.    GYNECOLOGIC HISTORY:  No LMP recorded (lmp unknown). Patient has had a hysterectomy. Menarche: 55 years old Age at first live birth: 55 years old [place] P 3 LMP 03/2019, with hysterectomy Contraceptive: used remotely for many years with no complications HRT never used  Hysterectomy? Yes, 03/2019 (path in Epic) BSO? 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 Her husband [name] (goes by [employer]") is vice  president of management for [employer], which manages apartment complexes. The patient's daughter Kearney Hard, age 20, works as a Theme park manager in [place]. She has 3 children. She, her husband, and their three children are living with [place] and J.F. Villareal. The patient's daughter Moody Bruins, age 16, lives in [place]. Kim Archer has legal custody of one of Elina's children. The patient's third child, Quillian Quince "Dalbert Mayotte, 56 years old, also lives in [place] and works in Architect. Patient has in total 8 grandchildren. She is a Psychologist, forensic.    ADVANCED DIRECTIVES: In the absence of any documentation to the contrary, the patient's spouse is their HCPOA.    HEALTH  MAINTENANCE: Social History   Tobacco Use   Smoking status: Never   Smokeless tobacco: Never  Vaping Use   Vaping Use: Never used  Substance Use Topics   Alcohol use: No   Drug use: No     Colonoscopy: 08/2018 (Dr. Ardis Hughs), recall 2030  PAP: 05/2020  Bone density: 05/2019 (Dr. Christen Butter office)   Allergies  Allergen Reactions   Ciprofloxacin Other (See Comments)    Patient came into office with complaints of difficulty swallowing.  Ate at [employer] prior and took Cipro prior   Sulfa Antibiotics Rash    Current Outpatient Medications  Medication Sig Dispense Refill   anastrozole (ARIMIDEX) 1 MG tablet Take 1 tablet (1 mg total) by mouth daily. 30 tablet 3   anastrozole (ARIMIDEX) 1 MG tablet Take 1 tablet (1 mg total) by mouth daily. 90 tablet 4   Boron 3 MG CAPS Take by mouth.     Calcium 500 MG tablet 1,000 mg.     gabapentin (NEURONTIN) 100 MG capsule Take 1 capsule (100 mg total) by mouth at bedtime. (Patient not taking: Reported on 01/28/2021) 90 capsule 4   ibuprofen (ADVIL) 800 MG tablet ibuprofen 800 mg tablet  TAKE 1 TABLET BY MOUTH THREE TIMES A DAY     ipratropium (ATROVENT) 0.06 % nasal spray Place into both nostrils.     levOCARNitine (CARNITINE PO) Take 1 capsule by mouth daily. L-Carnitine     lidocaine-prilocaine (EMLA) cream Apply topically daily.     loratadine (CLARITIN) 10 MG tablet Take 10 mg by mouth daily.     LUTEIN PO Take by mouth.     MAGNESIUM PO Take 1 tablet by mouth at bedtime.     Menatetrenone (VITAMIN K2) 100 MCG TABS Take by mouth.     Multiple Vitamin (MULTIVITAMIN WITH MINERALS) TABS tablet Take 1 tablet by mouth daily.     OVER THE COUNTER MEDICATION Take 1 capsule by mouth daily. [place] Mushroom 1265m     tobramycin-dexamethasone (TOBRADEX) ophthalmic solution PLACE 1 DROP INTO BOTH EYES IN THE MORNING AND AT BEDTIME. 5 mL 0   Ubiquinol 50 MG CAPS Take by mouth daily.     valACYclovir (VALTREX) 500 MG tablet  (Patient not taking:  Reported on 01/28/2021)  12   VITAMIN D PO Vitamin D     zinc sulfate 220 (50 Zn) MG capsule Take 220 mg by mouth daily.     No current facility-administered medications for this visit.   Facility-Administered Medications Ordered in Other Visits  Medication Dose Route Frequency Provider Last Rate Last Admin   0.9 %  sodium chloride infusion   Intravenous Once Arzell Mcgeehan, GVirgie Dad MD       acetaminophen (TYLENOL) tablet 650 mg  650 mg Oral Once Atlas Crossland, GVirgie Dad MD       diphenhydrAMINE (  BENADRYL) capsule 25 mg  25 mg Oral Once Jerimie Mancuso, Virgie Dad, MD       pertuzumab (PERJETA) 420 mg in sodium chloride 0.9 % 250 mL chemo infusion  420 mg Intravenous Once Gianmarco Roye, Virgie Dad, MD       trastuzumab-anns [facility]) 462 mg in sodium chloride 0.9 % 250 mL chemo infusion  6 mg/kg (Order-Specific) Intravenous Once Aireal Slater, Virgie Dad, MD        OBJECTIVE: White woman in no acute distress  Vitals:   02/02/21 0816  BP: 130/65  Pulse: 70  Resp: 17  Temp: (!) 97.5 F (36.4 C)  SpO2: 98%       Body mass index is 27.47 kg/m.   Wt Readings from Last 3 Encounters:  02/02/21 175 lb 6.4 oz (79.6 kg)  01/28/21 173 lb 12.8 oz (78.8 kg)  01/12/21 173 lb 14.4 oz (78.9 kg)     ECOG FS:1 - Symptomatic but completely ambulatory  Sclerae unicteric, EOMs intact Wearing a mask No cervical or supraclavicular adenopathy Lungs no rales or rhonchi Heart regular rate and rhythm Abd soft, nontender, positive bowel sounds MSK no focal spinal tenderness, no upper extremity lymphedema Neuro: nonfocal, well oriented, appropriate affect Breasts: The right breast is status postlumpectomy.  There is a minimal eschar on the incision measuring approximately 1 cm x 2 mm.  There is no evidence of infection.  Left breast is benign.  Both axillae are benign.   LAB RESULTS:  CMP     Component Value Date/Time   NA 145 02/02/2021 0759   NA 142 05/11/2016 1508   K 3.8 02/02/2021 0759   K 4.3 05/11/2016 1508   CL  110 02/02/2021 0759   CO2 25 02/02/2021 0759   CO2 26 05/11/2016 1508   GLUCOSE 99 02/02/2021 0759   GLUCOSE 90 05/11/2016 1508   BUN 17 02/02/2021 0759   BUN 10.9 05/11/2016 1508   CREATININE 0.81 02/02/2021 0759   CREATININE 0.77 08/04/2020 1540   CREATININE 1.0 05/11/2016 1508   CALCIUM 9.2 02/02/2021 0759   CALCIUM 9.5 05/11/2016 1508   PROT 7.1 02/02/2021 0759   PROT 7.0 05/11/2016 1508   ALBUMIN 4.0 02/02/2021 0759   ALBUMIN 3.9 05/11/2016 1508   AST 24 02/02/2021 0759   AST 29 08/04/2020 1540   AST 19 05/11/2016 1508   ALT 31 02/02/2021 0759   ALT 28 08/04/2020 1540   ALT 23 05/11/2016 1508   ALKPHOS 89 02/02/2021 0759   ALKPHOS 98 05/11/2016 1508   BILITOT 0.3 02/02/2021 0759   BILITOT 0.3 08/04/2020 1540   BILITOT 0.36 05/11/2016 1508   GFRNONAA >60 02/02/2021 0759   GFRNONAA >60 08/04/2020 1540    No results found for: TOTALPROTELP, ALBUMINELP, A1GS, A2GS, BETS, BETA2SER, GAMS, MSPIKE, SPEI  Lab Results  Component Value Date   WBC 5.2 02/02/2021   NEUTROABS 2.6 02/02/2021   HGB 13.5 02/02/2021   HCT 40.7 02/02/2021   MCV 93.6 02/02/2021   PLT 197 02/02/2021    No results found for: LABCA2  No components found for: [id number]  No results for input(s): INR in the last 168 hours.  No results found for: LABCA2  No results found for: NOT771  No results found for: HAF790  No results found for: XYB338  Lab Results  Component Value Date   CA2729 37.8 08/21/2020    No components found for: HGQUANT  No results found for: CEA1 / No results found for: CEA1   No results found for:  AFPTUMOR  No results found for: CHROMOGRNA  No results found for: KPAFRELGTCHN, LAMBDASER, KAPLAMBRATIO (kappa/lambda light chains)  No results found for: HGBA, HGBA2QUANT, HGBFQUANT, HGBSQUAN (Hemoglobinopathy evaluation)   No results found for: LDH  No results found for: IRON, TIBC, IRONPCTSAT (Iron and TIBC)  No results found for: FERRITIN  Urinalysis     Component Value Date/Time   COLORURINE AMBER (A) 10/03/2020 1003   APPEARANCEUR CLEAR 10/03/2020 1003   LABSPEC 1.012 10/03/2020 1003   PHURINE 6.0 10/03/2020 1003   GLUCOSEU NEGATIVE 10/03/2020 1003   HGBUR LARGE (A) 10/03/2020 1003   BILIRUBINUR NEGATIVE 10/03/2020 1003   [place] 10/03/2020 1003   PROTEINUR NEGATIVE 10/03/2020 1003   NITRITE POSITIVE (A) 10/03/2020 1003   LEUKOCYTESUR LARGE (A) 10/03/2020 1003    STUDIES: ECHOCARDIOGRAM COMPLETE  Result Date: 01/21/2021    ECHOCARDIOGRAM REPORT   Patient Name:   KEELEIGH TERRIS Date of Exam: 01/21/2021 Medical Rec #:  [id number]         Height:       67.0 in Accession #:    [id number]        Weight:       173.9 lb Date of Birth:  November 06, 1965         BSA:          1.906 m Patient Age:    65 years          BP:           108/71 mmHg Patient Gender: F                 HR:           64 bpm. Exam Location:  [facility] Procedure: 2D Echo, 3D Echo, Cardiac Doppler, Color Doppler and Strain Analysis Indications:    C50.511 Breast cancer  History:        Patient has prior history of Echocardiogram examinations, most                 recent 10/07/2020. Breast cancer. Bone metastasis.  Sonographer:    Basilia Jumbo BS, RDCS Referring Phys: [place]  1. Left ventricular ejection fraction, by estimation, is 60 to 65%. Left ventricular ejection fraction by 3D volume is 66 %. The left ventricle has normal function. The left ventricle has no regional wall motion abnormalities. Left ventricular diastolic  parameters were normal. The average left ventricular global longitudinal strain is -23.9 %. The global longitudinal strain is normal.  2. Right ventricular systolic function is normal. The right ventricular size is normal. There is normal pulmonary artery systolic pressure. The estimated right ventricular systolic pressure is 68.1 mmHg.  3. The mitral valve is normal in structure. Trivial mitral valve regurgitation. No evidence  of mitral stenosis.  4. The aortic valve is tricuspid. Aortic valve regurgitation is not visualized. No aortic stenosis is present.  5. The inferior vena cava is normal in size with greater than 50% respiratory variability, suggesting right atrial pressure of 3 mmHg. Comparison(s): Prior TTE on 10/07/20 with EF 60-65%. GLS -18.1%. No significant change from current study. FINDINGS  Left Ventricle: Left ventricular ejection fraction, by estimation, is 60 to 65%. Left ventricular ejection fraction by 3D volume is 66 %. The left ventricle has normal function. The left ventricle has no regional wall motion abnormalities. The average left ventricular global longitudinal strain is -23.9 %. The global longitudinal strain is normal. The left ventricular internal cavity size was normal in size.  There is borderline concentric left ventricular hypertrophy. Left ventricular diastolic parameters were normal. Right Ventricle: The right ventricular size is normal. No increase in right ventricular wall thickness. Right ventricular systolic function is normal. There is normal pulmonary artery systolic pressure. The tricuspid regurgitant velocity is 2.14 m/s, and  with an assumed right atrial pressure of 3 mmHg, the estimated right ventricular systolic pressure is 33.2 mmHg. Left Atrium: Left atrial size was normal in size. Right Atrium: Right atrial size was normal in size. Pericardium: There is no evidence of pericardial effusion. Mitral Valve: The mitral valve is normal in structure. Trivial mitral valve regurgitation. No evidence of mitral valve stenosis. Tricuspid Valve: The tricuspid valve is normal in structure. Tricuspid valve regurgitation is mild. Aortic Valve: The aortic valve is tricuspid. Aortic valve regurgitation is not visualized. No aortic stenosis is present. Pulmonic Valve: The pulmonic valve was normal in structure. Pulmonic valve regurgitation is trivial. Aorta: The aortic root and ascending aorta are structurally  normal, with no evidence of dilitation. Venous: The inferior vena cava is normal in size with greater than 50% respiratory variability, suggesting right atrial pressure of 3 mmHg. IAS/Shunts: No atrial level shunt detected by color flow Doppler.  LEFT VENTRICLE PLAX 2D LVIDd:         4.70 cm         Diastology LVIDs:         3.20 cm         LV e' medial:    7.53 cm/s LV PW:         0.90 cm         LV E/e' medial:  9.9 LV IVS:        0.90 cm         LV e' lateral:   12.60 cm/s LVOT diam:     2.00 cm         LV E/e' lateral: 5.9 LV SV:         81 LV SV Index:   43              2D LVOT Area:     3.14 cm        Longitudinal                                Strain                                2D Strain GLS  -24.9 %                                (A2C):                                2D Strain GLS  -21.9 %                                (A3C):                                2D Strain GLS  -25.0 %                                (  A4C):                                2D Strain GLS  -23.9 %                                Avg:                                 3D Volume EF                                LV 3D EF:    Left                                             ventricul                                             ar                                             ejection                                             fraction                                             by 3D                                             volume is                                             66 %.                                 3D Volume EF:                                3D EF:        66 %                                LV EDV:       124 ml  LV ESV:       42 ml                                LV SV:        82 ml RIGHT VENTRICLE             IVC RV Basal diam:  3.80 cm     IVC diam: 1.30 cm RV S prime:     13.80 cm/s TAPSE (M-mode): 1.8 cm RVSP:           21.3 mmHg LEFT ATRIUM             Index        RIGHT ATRIUM            Index LA diam:        4.10 cm 2.15 cm/m   RA Pressure: 3.00 mmHg LA Vol (A2C):   56.9 ml 29.86 ml/m  RA Area:     16.70 cm LA Vol (A4C):   38.2 ml 20.05 ml/m  RA Volume:   56.20 ml  29.49 ml/m LA Biplane Vol: 48.1 ml 25.24 ml/m  AORTIC VALVE LVOT Vmax:   118.00 cm/s LVOT Vmean:  77.600 cm/s LVOT VTI:    0.258 m  AORTA Ao Root diam: 3.00 cm Ao Asc diam:  3.20 cm MITRAL VALVE               TRICUSPID VALVE                            TR Peak grad:   18.3 mmHg MV Decel Time: 215 msec    TR Vmax:        214.00 cm/s MV E velocity: 74.70 cm/s  Estimated RAP:  3.00 mmHg MV A velocity: 62.80 cm/s  RVSP:           21.3 mmHg MV E/A ratio:  1.19                            SHUNTS                            Systemic VTI:  0.26 m                            Systemic Diam: 2.00 cm Gwyndolyn Kaufman MD Electronically signed by Gwyndolyn Kaufman MD Signature Date/Time: 01/21/2021/11:15:33 AM    Final       ELIGIBLE FOR AVAILABLE RESEARCH PROTOCOL: No  ASSESSMENT: 55 y.o. [place], [place] woman  LEFT BREAST ATYPICAL LOBULAR HYPERPLASIA (1) status post left lumpectomy 03/22/2016 showing atypical lobular hyperplasia  (2) prophylactic tamoxifen taken from 04/2016 through 03/2019  (a) status post hysterectomy (without bilateral salpingo-oophorectomy) February 2021  (3) genetic testing 05/22/2019 through the my risk hereditary cancer panel offered by [facility] found no deleterious mutations in BRCA, TP53, PALB2 or any of the other genes tested  (a) variants of uncertain significance were found in BRIP1 and NBN  RIGHT BREAST CANCER: METASTATIC DISEASE June 2022 (4) right breast lower outer quadrant biopsy 06/25/2020 shows a clinical T2 N1, stage IB invasive ductal carcinoma, grade 2 or 3, triple positive, with an MIB-1 of 40%.  (A) breast MRI 07/07/2020 shows mcT2 N1 disease as well as an  enhancing lesion in the sternum (B) non-contrast Chest CT scan 07/23/2020 shows right axillary and retropectoral adenopathy, also several  liver hypodensities and very small lytic/sclerotic bone lesions  (C) bone scan 07/25/2020 shows abnormal uptake at T8, T10, midline lower cervical spine, superior LEFT manubrium and posterior LEFT ninth rib  (D) abdominal MRI with and w/o conttrast 07/26/2020 shows a right hepatic 1.2 cm indeterminate lesion, no other lesions of concern in the liver; bone lesions re-demonstrated, largetst at T10  (E) PET scan 08/01/2020 shows no hypermetabolism in the 1.2 cm right liver lesion; bony hypermetabolism consistent with known mets noted  (F) biopsy T10 to be performed at the end of chemotherapy  (G) brain MRI 08/12/2020 negative (H) CA 27-29 on 08/19/2020 NONINFORMATIVE  (5) neoadjuvant chemotherapy consisting of docetaxel, carboplatin, trastuzumab and pertuzumab every 21 days x 4 cycles started 07/28/2020, last chemotherapy 09/30/2020  (6) anti-HER2 treatment to be continued indefinitely  (a) echocardiogram on 07/03/2020 shows EF of 55-60%  (b) echo 10/07/2020 shows an ejection fraction in the 60-65% range  (C) echo 01/21/2021 shows an ejection fraction in the 60-65 % range  (7) no adjuvant radiation planned (discussed 10/22/2020 conference and patient met with Rad Onc 01/28/2021)  (8) zoledronate started 08/19/2020, to be repeated every 12 weeks  (9) restaging studies:  (A) MRI of the brain with and without contrast 08/12/2020 benign  (B) total spinal MRI 10/07/2020 shows stable to improved disease, no extraosseous extension, pathologic fracture, epidural or intracanalicular involvement  (C) MRI of the breast 10/19/2020 shows a complete imaging response in the breast, with new ring-enhancing lesions in the left breast with MRI guided biopsy 11/10/2020 showing no evidence of malignancy  (10) right lumpectomy with no sentinel lymph node sampling 01/02/2021 showed scattered foci of residual invasive ductal carcinoma, grade 2, the largest measuring 0.8 cm.  Margins were negative. (mypT1b ycN0)  (11)  anastrozole started 02/03/2021  (A) bone density 05/22/2019 showed a T score of -1.4  (B) consider adding abemaciclib at some point   PLAN: Kim Archer is now a little over a month out from surgery for her right breast cancer.  She had a very good response to chemoimmunotherapy, and we discussed again today that doing radiation to the right breast would not impact survival.  If she had not had surgery to the right breast I would be more worried about a local recurrence, but since she did have surgery I think all we need is close follow-up.  I would probably do a bilateral breast mammography with right breast ultrasonography in March and repeat the right breast mammography and ultrasonography in September and continue that for a couple of years then perhaps go to yearly.  Alternatively we could alternate breast MRI with bilateral breast mammography and either way I think we would be assured that if there is any evidence of recurrence or progression on the right side we would catch it early enough to prevent chest wall recurrence  I don't know why she would not have received her anastrozole yet since that was placed more than 3 weeks ago but I went ahead and placed the prescription both to her local pharmacy (30 days) so she can start it now, and to her mail order pharmacy  She will receive Zometa today and we will continue that on an every 47-monthbasis.  She already [place] January.  She knows to call for any other issue that may develop before then.  Total encounter time 25 minutes.Sarajane Jews C. Tylique Aull, MD 02/02/21 8:41 AM Medical Oncology and Hematology [facility] [place], [place] [address] Tel. [phone]    Fax. [phone]   I, Wilburn Mylar, am acting as scribe for Dr. Virgie Dad. Dereona Kolodny.  I, Lurline Del MD, have reviewed the above documentation for accuracy and completeness, and I agree  with the above.   *Total Encounter Time as defined by the Centers for Medicare and Medicaid Services includes, in addition to the face-to-face time of a patient visit (documented in the note above) non-face-to-face time: obtaining and reviewing outside history, ordering and reviewing medications, tests or procedures, care coordination (communications with other health care professionals or caregivers) and documentation in the medical record.

## 2021-02-02 ENCOUNTER — Other Ambulatory Visit: Payer: Self-pay

## 2021-02-02 ENCOUNTER — Inpatient Hospital Stay: Payer: 59 | Attending: Oncology

## 2021-02-02 ENCOUNTER — Inpatient Hospital Stay (HOSPITAL_BASED_OUTPATIENT_CLINIC_OR_DEPARTMENT_OTHER): Payer: 59 | Admitting: Oncology

## 2021-02-02 ENCOUNTER — Inpatient Hospital Stay: Payer: 59

## 2021-02-02 VITALS — BP 130/65 | HR 70 | Temp 97.5°F | Resp 17 | Wt 175.4 lb

## 2021-02-02 DIAGNOSIS — Z79899 Other long term (current) drug therapy: Secondary | ICD-10-CM | POA: Diagnosis not present

## 2021-02-02 DIAGNOSIS — C50511 Malignant neoplasm of lower-outer quadrant of right female breast: Secondary | ICD-10-CM

## 2021-02-02 DIAGNOSIS — Z170 Estrogen receptor positive status [ER+]: Secondary | ICD-10-CM | POA: Diagnosis not present

## 2021-02-02 DIAGNOSIS — Z5112 Encounter for antineoplastic immunotherapy: Secondary | ICD-10-CM | POA: Insufficient documentation

## 2021-02-02 DIAGNOSIS — Z7189 Other specified counseling: Secondary | ICD-10-CM

## 2021-02-02 DIAGNOSIS — C7951 Secondary malignant neoplasm of bone: Secondary | ICD-10-CM | POA: Diagnosis not present

## 2021-02-02 DIAGNOSIS — C773 Secondary and unspecified malignant neoplasm of axilla and upper limb lymph nodes: Secondary | ICD-10-CM | POA: Diagnosis not present

## 2021-02-02 DIAGNOSIS — M899 Disorder of bone, unspecified: Secondary | ICD-10-CM

## 2021-02-02 DIAGNOSIS — Z95828 Presence of other vascular implants and grafts: Secondary | ICD-10-CM

## 2021-02-02 LAB — COMPREHENSIVE METABOLIC PANEL
ALT: 31 U/L (ref 0–44)
AST: 24 U/L (ref 15–41)
Albumin: 4 g/dL (ref 3.5–5.0)
Alkaline Phosphatase: 89 U/L (ref 38–126)
Anion gap: 10 (ref 5–15)
BUN: 17 mg/dL (ref 6–20)
CO2: 25 mmol/L (ref 22–32)
Calcium: 9.2 mg/dL (ref 8.9–10.3)
Chloride: 110 mmol/L (ref 98–111)
Creatinine, Ser: 0.81 mg/dL (ref 0.44–1.00)
GFR, Estimated: >60 mL/min (ref >60)
Glucose, Bld: 99 mg/dL (ref 70–99)
Potassium: 3.8 mmol/L (ref 3.5–5.1)
Sodium: 145 mmol/L (ref 135–145)
Total Bilirubin: 0.3 mg/dL (ref 0.3–1.2)
Total Protein: 7.1 g/dL (ref 6.5–8.1)

## 2021-02-02 LAB — CBC WITH DIFFERENTIAL/PLATELET
Abs Immature Granulocytes: 0.01 10*3/uL (ref 0.00–0.07)
Basophils Absolute: 0 10*3/uL (ref 0.0–0.1)
Basophils Relative: 0 %
Eosinophils Absolute: 0.3 10*3/uL (ref 0.0–0.5)
Eosinophils Relative: 6 %
HCT: 40.7 % (ref 36.0–46.0)
Hemoglobin: 13.5 g/dL (ref 12.0–15.0)
Immature Granulocytes: 0 %
Lymphocytes Relative: 39 %
Lymphs Abs: 2 10*3/uL (ref 0.7–4.0)
MCH: 31 pg (ref 26.0–34.0)
MCHC: 33.2 g/dL (ref 30.0–36.0)
MCV: 93.6 fL (ref 80.0–100.0)
Monocytes Absolute: 0.3 10*3/uL (ref 0.1–1.0)
Monocytes Relative: 6 %
Neutro Abs: 2.6 10*3/uL (ref 1.7–7.7)
Neutrophils Relative %: 49 %
Platelets: 197 10*3/uL (ref 150–400)
RBC: 4.35 MIL/uL (ref 3.87–5.11)
RDW: 11.9 % (ref 11.5–15.5)
WBC: 5.2 10*3/uL (ref 4.0–10.5)
nRBC: 0 % (ref 0.0–0.2)

## 2021-02-02 MED ORDER — ZOLEDRONIC ACID 4 MG/100ML IV SOLN
4.0000 mg | Freq: Once | INTRAVENOUS | Status: AC
  Administered 2021-02-02: 09:00:00 4 mg via INTRAVENOUS

## 2021-02-02 MED ORDER — SODIUM CHLORIDE 0.9 % IV SOLN: Freq: Once | INTRAVENOUS | Status: AC

## 2021-02-02 MED ORDER — SODIUM CHLORIDE 0.9% FLUSH
10.0000 mL | INTRAVENOUS | Status: DC | PRN
  Administered 2021-02-02: 08:00:00 10 mL via INTRAVENOUS

## 2021-02-02 MED ORDER — ANASTROZOLE 1 MG PO TABS: 1.0000 mg | ORAL_TABLET | Freq: Every day | ORAL | 4 refills | Status: DC

## 2021-02-02 MED ORDER — ANASTROZOLE 1 MG PO TABS: 1.0000 mg | ORAL_TABLET | Freq: Every day | ORAL | 3 refills | Status: DC

## 2021-02-02 MED ORDER — DIPHENHYDRAMINE HCL 25 MG PO CAPS
25.0000 mg | ORAL_CAPSULE | Freq: Once | ORAL | Status: AC
  Administered 2021-02-02: 09:00:00 25 mg via ORAL

## 2021-02-02 MED ORDER — ACETAMINOPHEN 325 MG PO TABS
650.0000 mg | ORAL_TABLET | Freq: Once | ORAL | Status: AC
  Administered 2021-02-02: 09:00:00 650 mg via ORAL

## 2021-02-02 MED ORDER — SODIUM CHLORIDE 0.9 % IV SOLN
420.0000 mg | Freq: Once | INTRAVENOUS | Status: AC
  Administered 2021-02-02: 10:00:00 420 mg via INTRAVENOUS
  Filled 2021-02-02: qty 14

## 2021-02-02 MED ORDER — TRASTUZUMAB-ANNS CHEMO 150 MG IV SOLR
6.0000 mg/kg | Freq: Once | INTRAVENOUS | Status: AC
  Administered 2021-02-02: 09:00:00 462 mg via INTRAVENOUS
  Filled 2021-02-02: qty 22

## 2021-02-02 NOTE — Patient Instructions (Signed)
[facility]COL[facility]  Discharge Instructions: Thank you for choosing [facility] to provide your oncology and hematology care.   If you have a lab appointment with the [facility], please go directly to the [facility] and check in at the registration area.   Wear comfortable clothing and clothing appropriate for easy access to any Portacath or PICC line.   We strive to give you quality time with your provider. You may need to reschedule your appointment if you arrive late (15 or more minutes).  Arriving late affects you and other patients whose appointments are after yours.  Also, if you miss three or more appointments without notifying the office, you may be dismissed from the clinic at the providers discretion.      For prescription refill requests, have your pharmacy contact our office and allow 72 hours for refills to be completed.    Today you received the following chemotherapy and/or immunotherapy agents trastuzumab and Perjeta      To help prevent nausea and vomiting after your treatment, we encourage you to take your nausea medication as directed.  BELOW ARE SYMPTOMS THAT SHOULD BE REPORTED IMMEDIATELY: *FEVER GREATER THAN 100.4 F (38 C) OR HIGHER *CHILLS OR SWEATING *NAUSEA AND VOMITING THAT IS NOT CONTROLLED WITH YOUR NAUSEA MEDICATION *UNUSUAL SHORTNESS OF BREATH *UNUSUAL BRUISING OR BLEEDING *URINARY PROBLEMS (pain or burning when urinating, or frequent urination) *BOWEL PROBLEMS (unusual diarrhea, constipation, pain near the anus) TENDERNESS IN MOUTH AND THROAT WITH OR WITHOUT PRESENCE OF ULCERS (sore throat, sores in mouth, or a toothache) UNUSUAL RASH, SWELLING OR PAIN  UNUSUAL VAGINAL DISCHARGE OR ITCHING   Items with * indicate a potential emergency and should be followed up as soon as possible or go to the Emergency Department if any problems should occur.  Please show the CHEMOTHERAPY ALERT CARD or IMMUNOTHERAPY ALERT CARD at  check-in to the Emergency Department and triage nurse.  Should you have questions after your visit or need to cancel or reschedule your appointment, please contact [facility]   Dept: [phone]  and follow the prompts.  Office hours are 8:00 a.m. to 4:30 p.m. Monday - Friday. Please note that voicemails left after 4:00 p.m. may not be returned until the following business day.  We are closed weekends and major holidays. You have access to a nurse at all times for urgent questions. Please call the main number to the clinic Dept: [phone] and follow the prompts.   For any non-urgent questions, you may also contact your provider using MyChart. We now offer e-Visits for anyone 33 and older to request care online for non-urgent symptoms. For details visit [email or web address].   Also download the MyChart app! Go to the app store, search "MyChart", open the app, select [facility], and log in with your MyChart username and password.  Due to Covid, a mask is required upon entering the hospital/clinic. If you do not have a mask, one will be given to you upon arrival. For doctor visits, patients may have 1 support person aged 90 or older with them. For treatment visits, patients cannot have anyone with them due to current Covid guidelines and our immunocompromised population.

## 2021-02-10 ENCOUNTER — Telehealth: Payer: Self-pay

## 2021-02-10 MED ORDER — MOLNUPIRAVIR EUA 200MG CAPSULE
4.0000 | ORAL_CAPSULE | Freq: Two times a day (BID) | ORAL | 0 refills | Status: AC
Start: 2021-02-10 — End: 2021-02-15

## 2021-02-10 NOTE — Telephone Encounter (Signed)
Pt called and states she tested positive for COVID 02/08/21. Advised pt to take recommended dose of Vitamin C 1000 mg, Zinc 50 mg and Vitamin D3 5000 units. Molnupiravir sent to pts preferred phx per Dr Chryl Heck. Pt verbalized thanks and understanding.

## 2021-02-17 ENCOUNTER — Encounter: Payer: Self-pay | Admitting: Oncology

## 2021-02-20 ENCOUNTER — Encounter: Payer: Self-pay | Admitting: Oncology

## 2021-02-22 NOTE — Assessment & Plan Note (Signed)
Metastatic Breast cancer ER/PR and Her 2 Positive: S/p NAC with TCHP and BCS Current Treatment: HP maintenance with Anastrozole and Xgeva  10/08/20: Scattered osseous metastatic disease involving the thoracolumbar spine as detailed above, somewhat less pronounced as compared to previous abdominal MRI from 07/26/2020, and may be improved. Most prominent of these lesions measures 2 cm at the T10 vertebral body.  RTC every 3 weeks for herceptin and Perjeta maintenance and every 6 weeks for MD visits

## 2021-02-23 ENCOUNTER — Inpatient Hospital Stay: Payer: Commercial Managed Care - PPO

## 2021-02-23 ENCOUNTER — Other Ambulatory Visit: Payer: 59

## 2021-02-23 ENCOUNTER — Ambulatory Visit: Payer: 59

## 2021-02-23 ENCOUNTER — Other Ambulatory Visit: Payer: Self-pay

## 2021-02-23 ENCOUNTER — Ambulatory Visit: Payer: 59 | Admitting: Hematology and Oncology

## 2021-02-23 ENCOUNTER — Inpatient Hospital Stay: Payer: Commercial Managed Care - PPO | Attending: Oncology

## 2021-02-23 ENCOUNTER — Inpatient Hospital Stay (HOSPITAL_BASED_OUTPATIENT_CLINIC_OR_DEPARTMENT_OTHER): Payer: Commercial Managed Care - PPO | Admitting: Hematology and Oncology

## 2021-02-23 ENCOUNTER — Encounter: Payer: Self-pay | Admitting: *Deleted

## 2021-02-23 VITALS — BP 118/65 | HR 64 | Temp 97.7°F | Resp 18 | Ht 67.0 in | Wt 174.9 lb

## 2021-02-23 VITALS — BP 107/59 | HR 59 | Temp 98.9°F | Resp 16

## 2021-02-23 DIAGNOSIS — C50511 Malignant neoplasm of lower-outer quadrant of right female breast: Secondary | ICD-10-CM

## 2021-02-23 DIAGNOSIS — Z5112 Encounter for antineoplastic immunotherapy: Secondary | ICD-10-CM | POA: Diagnosis present

## 2021-02-23 DIAGNOSIS — Z95828 Presence of other vascular implants and grafts: Secondary | ICD-10-CM

## 2021-02-23 DIAGNOSIS — Z79899 Other long term (current) drug therapy: Secondary | ICD-10-CM | POA: Insufficient documentation

## 2021-02-23 DIAGNOSIS — Z170 Estrogen receptor positive status [ER+]: Secondary | ICD-10-CM | POA: Insufficient documentation

## 2021-02-23 DIAGNOSIS — C7951 Secondary malignant neoplasm of bone: Secondary | ICD-10-CM

## 2021-02-23 LAB — COMPREHENSIVE METABOLIC PANEL
ALT: 26 U/L (ref 0–44)
AST: 18 U/L (ref 15–41)
Albumin: 4.2 g/dL (ref 3.5–5.0)
Alkaline Phosphatase: 79 U/L (ref 38–126)
Anion gap: 7 (ref 5–15)
BUN: 15 mg/dL (ref 6–20)
CO2: 28 mmol/L (ref 22–32)
Calcium: 9.7 mg/dL (ref 8.9–10.3)
Chloride: 108 mmol/L (ref 98–111)
Creatinine, Ser: 0.8 mg/dL (ref 0.44–1.00)
GFR, Estimated: >60 mL/min (ref >60)
Glucose, Bld: 100 mg/dL — ABNORMAL HIGH (ref 70–99)
Potassium: 3.9 mmol/L (ref 3.5–5.1)
Sodium: 143 mmol/L (ref 135–145)
Total Bilirubin: 0.4 mg/dL (ref 0.3–1.2)
Total Protein: 6.9 g/dL (ref 6.5–8.1)

## 2021-02-23 LAB — CBC WITH DIFFERENTIAL/PLATELET
Abs Immature Granulocytes: 0.02 10*3/uL (ref 0.00–0.07)
Basophils Absolute: 0 10*3/uL (ref 0.0–0.1)
Basophils Relative: 1 %
Eosinophils Absolute: 0.4 10*3/uL (ref 0.0–0.5)
Eosinophils Relative: 8 %
HCT: 39.8 % (ref 36.0–46.0)
Hemoglobin: 12.9 g/dL (ref 12.0–15.0)
Immature Granulocytes: 0 %
Lymphocytes Relative: 36 %
Lymphs Abs: 1.9 10*3/uL (ref 0.7–4.0)
MCH: 30 pg (ref 26.0–34.0)
MCHC: 32.4 g/dL (ref 30.0–36.0)
MCV: 92.6 fL (ref 80.0–100.0)
Monocytes Absolute: 0.3 10*3/uL (ref 0.1–1.0)
Monocytes Relative: 6 %
Neutro Abs: 2.6 10*3/uL (ref 1.7–7.7)
Neutrophils Relative %: 49 %
Platelets: 232 10*3/uL (ref 150–400)
RBC: 4.3 MIL/uL (ref 3.87–5.11)
RDW: 12.3 % (ref 11.5–15.5)
WBC: 5.3 10*3/uL (ref 4.0–10.5)
nRBC: 0 % (ref 0.0–0.2)

## 2021-02-23 MED ORDER — SODIUM CHLORIDE 0.9% FLUSH
10.0000 mL | INTRAVENOUS | Status: DC | PRN
  Administered 2021-02-23: 10 mL via INTRAVENOUS

## 2021-02-23 MED ORDER — DIPHENHYDRAMINE HCL 25 MG PO CAPS
25.0000 mg | ORAL_CAPSULE | Freq: Once | ORAL | Status: AC
  Administered 2021-02-23: 25 mg via ORAL
  Filled 2021-02-23: qty 1

## 2021-02-23 MED ORDER — SODIUM CHLORIDE 0.9 % IV SOLN: Freq: Once | INTRAVENOUS | Status: AC

## 2021-02-23 MED ORDER — SODIUM CHLORIDE 0.9% FLUSH
10.0000 mL | INTRAVENOUS | Status: DC | PRN
  Administered 2021-02-23: 10 mL

## 2021-02-23 MED ORDER — SODIUM CHLORIDE 0.9 % IV SOLN
420.0000 mg | Freq: Once | INTRAVENOUS | Status: AC
  Administered 2021-02-23: 420 mg via INTRAVENOUS
  Filled 2021-02-23: qty 14

## 2021-02-23 MED ORDER — HEPARIN SOD (PORK) LOCK FLUSH 100 UNIT/ML IV SOLN
500.0000 [IU] | Freq: Once | INTRAVENOUS | Status: AC | PRN
  Administered 2021-02-23: 500 [IU]

## 2021-02-23 MED ORDER — ACETAMINOPHEN 325 MG PO TABS
650.0000 mg | ORAL_TABLET | Freq: Once | ORAL | Status: AC
  Administered 2021-02-23: 650 mg via ORAL
  Filled 2021-02-23: qty 2

## 2021-02-23 MED ORDER — TRASTUZUMAB-ANNS CHEMO 150 MG IV SOLR
6.0000 mg/kg | Freq: Once | INTRAVENOUS | Status: AC
  Administered 2021-02-23: 462 mg via INTRAVENOUS
  Filled 2021-02-23: qty 22

## 2021-02-23 NOTE — Patient Instructions (Addendum)
[facility]  Discharge Instructions: Thank you for choosing [facility] to provide your oncology and hematology care.   If you have a lab appointment with the [facility], please go directly to the [facility] and check in at the registration area.   Wear comfortable clothing and clothing appropriate for easy access to any Portacath or PICC line.   We strive to give you quality time with your provider. You may need to reschedule your appointment if you arrive late (15 or more minutes).  Arriving late affects you and other patients whose appointments are after yours.  Also, if you miss three or more appointments without notifying the office, you may be dismissed from the clinic at the providers discretion.      For prescription refill requests, have your pharmacy contact our office and allow 72 hours for refills to be completed.    Today you received the following chemotherapy and/or immunotherapy agents: Trastuzumab (Kanjinti) and Pertuzumab (Perjeta).   To help prevent nausea and vomiting after your treatment, we encourage you to take your nausea medication as directed.  BELOW ARE SYMPTOMS THAT SHOULD BE REPORTED IMMEDIATELY: *FEVER GREATER THAN 100.4 F (38 C) OR HIGHER *CHILLS OR SWEATING *NAUSEA AND VOMITING THAT IS NOT CONTROLLED WITH YOUR NAUSEA MEDICATION *UNUSUAL SHORTNESS OF BREATH *UNUSUAL BRUISING OR BLEEDING *URINARY PROBLEMS (pain or burning when urinating, or frequent urination) *BOWEL PROBLEMS (unusual diarrhea, constipation, pain near the anus) TENDERNESS IN MOUTH AND THROAT WITH OR WITHOUT PRESENCE OF ULCERS (sore throat, sores in mouth, or a toothache) UNUSUAL RASH, SWELLING OR PAIN  UNUSUAL VAGINAL DISCHARGE OR ITCHING   Items with * indicate a potential emergency and should be followed up as soon as possible or go to the Emergency Department if any problems should occur.  Please show the CHEMOTHERAPY ALERT CARD or  IMMUNOTHERAPY ALERT CARD at check-in to the Emergency Department and triage nurse.  Should you have questions after your visit or need to cancel or reschedule your appointment, please contact [facility]  Dept: [phone]  and follow the prompts.  Office hours are 8:00 a.m. to 4:30 p.m. Monday - Friday. Please note that voicemails left after 4:00 p.m. may not be returned until the following business day.  We are closed weekends and major holidays. You have access to a nurse at all times for urgent questions. Please call the main number to the clinic Dept: [phone] and follow the prompts.   For any non-urgent questions, you may also contact your provider using MyChart. We now offer e-Visits for anyone 7 and older to request care online for non-urgent symptoms. For details visit [email or web address].   Also download the MyChart app! Go to the app store, search "MyChart", open the app, select [facility], and log in with your MyChart username and password.  Due to Covid, a mask is required upon entering the hospital/clinic. If you do not have a mask, one will be given to you upon arrival. For doctor visits, patients may have 1 support person aged 57 or older with them. For treatment visits, patients cannot have anyone with them due to current Covid guidelines and our immunocompromised population.

## 2021-02-23 NOTE — Progress Notes (Signed)
Patient Care Team: [name], MD as PCP - General (Obstetrics and Gynecology) [name], MD as Consulting Physician (Obstetrics and Gynecology) Mauro Kaufmann, RN as Oncology Nurse Navigator Carlynn Spry, Charlott Holler, RN as Oncology Nurse Navigator Jovita Kussmaul, MD as Consulting Physician (General Surgery) Gery Pray, MD as Consulting Physician (Radiation Oncology) Raina Mina, RPH-CPP (Pharmacist) Larey Dresser, MD as Consulting Physician (Cardiology) Nicholas Lose, MD as Medical Oncologist (Hematology and Oncology)  DIAGNOSIS:  Encounter Diagnosis  Name Primary?   Malignant neoplasm of lower-outer quadrant of right breast of female, estrogen receptor positive ([place])     SUMMARY OF ONCOLOGIC HISTORY: Oncology History  Malignant neoplasm of lower-outer quadrant of right breast of female, estrogen receptor positive ([place])  03/22/2016 Surgery   left lumpectomy 03/22/2016 showing atypical lobular hyperplasia prophylactic tamoxifen taken from 04/2016 through 03/2019   06/06/2019 Genetic Testing   Negative genetic testing:  No pathogenic variants detected on the [facility] panel, ordered by Dr. Helane Rima at [facility]. Two variants of uncertain significance (VUS) were detected - one in the BRIP1 gene called c.2863A>C and a second in the NBN gene called c.643C>T. The report date is 06/06/2019.  The [facility] gene panel offered by [employer] includes sequencing and deletion/duplication testing of the following 35 genes: APC, ATM, AXIN2, BARD1, BMPR1A, BRCA1, BRCA2, BRIP1, CDH1, CDK4, CDKN2A, CHEK2, EPCAM (large rearrangement only), HOXB13 (sequencing only), GALNT12, MLH1, MSH2, MSH3 (excluding repetitive portions of exon 1), MSH6, MUTYH, NBN, NTHL1, PALB2, PMS2, PTEN, RAD51C, RAD51D, RNF43, RPS20, SMAD4, STK11, and TP53. Sequencing was performed for select regions of POLE and POLD1, and large rearrangement analysis was performed for select  regions of GREM1.     06/25/2020 Relapse/Recurrence   right breast lower outer quadrant biopsy 06/25/2020 shows a clinical T2 N1, stage IB invasive ductal carcinoma, grade 2 or 3, triple positive, with an MIB-1 of 40%   07/02/2020 Cancer Staging   Staging form: Breast, AJCC 8th Edition - Clinical stage from 07/02/2020: Stage IV (cT2, cN1, cM1, G3, ER+, PR+, HER2+) - Signed by Chauncey Cruel, MD on 08/04/2020 Stage prefix: Initial diagnosis Histologic grading system: 3 grade system Laterality: Right Staged by: Pathologist and managing physician Stage used in treatment planning: Yes National guidelines used in treatment planning: Yes Type of national guideline used in treatment planning: NCCN    07/16/2020 - 09/30/2020 Chemotherapy   neoadjuvant chemotherapy consisting of docetaxel, carboplatin, trastuzumab and pertuzumab every 21 days x 4 cycles started 07/28/2020, last chemotherapy 09/30/2020       08/01/2020 PET scan   PET scan 08/01/2020 shows no hypermetabolism in the 1.2 cm right liver lesion; bony hypermetabolism consistent with known mets   08/19/2020 - 10/21/2020 Chemotherapy   Patient is on Treatment Plan : BREAST  Trastuzumab + Pertuzumab q21d      10/07/2020 Imaging    total spinal MRI 10/07/2020 shows stable to improved disease, no extraosseous extension, pathologic fracture, epidural or intracanalicular involvement MRI of the breast 10/19/2020 shows a complete imaging response in the breast, with new ring-enhancing lesions in the left breast with MRI guided biopsy 11/10/2020 showing no evidence of malignancy   11/11/2020 -  Chemotherapy   Patient is on Treatment Plan : BREAST Trastuzumab + Pertuzumab q21d     01/02/2021 Surgery   right lumpectomy with no sentinel lymph node sampling 01/02/2021 showed scattered foci of residual invasive ductal carcinoma, grade 2, the largest measuring 0.8 cm.  Margins were negative   01/02/2021 -  Anti-estrogen oral therapy   anastrozole  started 01/12/2021   Bone metastases ([place])  08/04/2020 Initial Diagnosis   Bone metastases ([place])   11/11/2020 -  Chemotherapy   Patient is on Treatment Plan : BREAST Trastuzumab + Pertuzumab q21d       CHIEF COMPLIANT: Herceptin Perjeta, anastrozole  INTERVAL HISTORY: [facility] is a 56 year old above-mentioned is metastatic breast cancer is currently on Herceptin and Perjeta maintenance.  She is also been on anastrozole since January 2023.  She is tolerating anastrozole reasonably well.  So far does not have any hot flashes.  Her major complaint has been runny nose and congestion.  She had COVID in December and has recovered from it for the most part except for the sinus congestion.  She takes a inhaler for that.   ALLERGIES:  is allergic to ciprofloxacin and sulfa antibiotics.  MEDICATIONS:  Current Outpatient Medications  Medication Sig Dispense Refill   anastrozole (ARIMIDEX) 1 MG tablet Take 1 tablet (1 mg total) by mouth daily. 30 tablet 3   anastrozole (ARIMIDEX) 1 MG tablet Take 1 tablet (1 mg total) by mouth daily. 90 tablet 4   Boron 3 MG CAPS Take by mouth.     Calcium 500 MG tablet 1,000 mg.     ibuprofen (ADVIL) 800 MG tablet ibuprofen 800 mg tablet  TAKE 1 TABLET BY MOUTH THREE TIMES A DAY     ipratropium (ATROVENT) 0.06 % nasal spray Place into both nostrils.     levOCARNitine (CARNITINE PO) Take 1 capsule by mouth daily. L-Carnitine     lidocaine-prilocaine (EMLA) cream Apply topically daily.     loratadine (CLARITIN) 10 MG tablet Take 10 mg by mouth daily.     LUTEIN PO Take by mouth.     MAGNESIUM PO Take 1 tablet by mouth at bedtime.     Menatetrenone (VITAMIN K2) 100 MCG TABS Take by mouth.     Multiple Vitamin (MULTIVITAMIN WITH MINERALS) TABS tablet Take 1 tablet by mouth daily.     OVER THE COUNTER MEDICATION Take 1 capsule by mouth daily. [place] Mushroom 1235m     Ubiquinol 50 MG CAPS Take by mouth daily.     VITAMIN D PO Vitamin D     zinc  sulfate 220 (50 Zn) MG capsule Take 220 mg by mouth daily.     No current facility-administered medications for this visit.    PHYSICAL EXAMINATION: ECOG PERFORMANCE STATUS: 1 - Symptomatic but completely ambulatory  Vitals:   02/23/21 0919  BP: 118/65  Pulse: 64  Resp: 18  Temp: 97.7 F (36.5 C)  SpO2: 98%   Filed Weights   02/23/21 0919  Weight: 174 lb 14.4 oz (79.3 kg)      LABORATORY DATA:  I have reviewed the data as listed CMP Latest Ref Rng & Units 02/02/2021 01/12/2021 12/22/2020  Glucose 70 - 99 mg/dL 99 84 104(H)  BUN 6 - 20 mg/dL '17 14 14  ' Creatinine 0.44 - 1.00 mg/dL 0.81 0.80 0.79  Sodium 135 - 145 mmol/L 145 144 144  Potassium 3.5 - 5.1 mmol/L 3.8 3.9 3.8  Chloride 98 - 111 mmol/L 110 108 111  CO2 22 - 32 mmol/L '25 25 24  ' Calcium 8.9 - 10.3 mg/dL 9.2 9.3 9.1  Total Protein 6.5 - 8.1 g/dL 7.1 6.9 6.6  Total Bilirubin 0.3 - 1.2 mg/dL 0.3 0.3 0.2(L)  Alkaline Phos 38 - 126 U/L 89 86 87  AST 15 - 41 U/L 24 26  25  ALT 0 - 44 U/L 31 35 37    Lab Results  Component Value Date   WBC 5.2 02/02/2021   HGB 13.5 02/02/2021   HCT 40.7 02/02/2021   MCV 93.6 02/02/2021   PLT 197 02/02/2021   NEUTROABS 2.6 02/02/2021    ASSESSMENT & PLAN:  Malignant neoplasm of lower-outer quadrant of right breast of female, estrogen receptor positive ([place]) Metastatic Breast cancer ER/PR and Her 2 Positive: S/p NAC with TCHP and BCS Current Treatment: HP maintenance with Anastrozole and Xgeva  10/08/20: Scattered osseous metastatic disease involving the thoracolumbar spine as detailed above, somewhat less pronounced as compared to previous abdominal MRI from 07/26/2020, and may be improved. Most prominent of these lesions measures 2 cm at the T10 vertebral body.  Nasal congestion: I discussed with her about adding Sudafed decongestant to help alleviate the symptoms.  She takes some nasal spray as well.  I discussed with her about obtaining CT chest abdomen pelvis with contrast  prior to her next treatment to assess response to therapy.  RTC every 3 weeks for herceptin and Perjeta maintenance and every 6 weeks for MD visits   No orders of the defined types were placed in this encounter.  The patient has a good understanding of the overall plan. she agrees with it. she will call with any problems that may develop before the next visit here. Total time spent: 45 mins including face to face time and time spent for planning, charting and co-ordination of care   Harriette Ohara, MD 02/23/21

## 2021-02-24 ENCOUNTER — Encounter: Payer: Self-pay | Admitting: Oncology

## 2021-02-24 ENCOUNTER — Telehealth: Payer: Self-pay | Admitting: Hematology and Oncology

## 2021-02-24 NOTE — Telephone Encounter (Signed)
Scheduled appointment per 1/9 los. Patient is aware. 

## 2021-02-26 ENCOUNTER — Encounter: Payer: Self-pay | Admitting: Oncology

## 2021-03-09 ENCOUNTER — Other Ambulatory Visit: Payer: Self-pay | Admitting: Oncology

## 2021-03-12 ENCOUNTER — Other Ambulatory Visit: Payer: Self-pay

## 2021-03-12 ENCOUNTER — Ambulatory Visit (HOSPITAL_COMMUNITY)
Admission: RE | Admit: 2021-03-12 | Discharge: 2021-03-12 | Disposition: A | Discharge status: Home / Self Care | Payer: Commercial Managed Care - PPO | Source: Ambulatory Visit | Attending: Hematology and Oncology | Admitting: Hematology and Oncology

## 2021-03-12 DIAGNOSIS — C7951 Secondary malignant neoplasm of bone: Secondary | ICD-10-CM | POA: Diagnosis present

## 2021-03-12 DIAGNOSIS — Z170 Estrogen receptor positive status [ER+]: Secondary | ICD-10-CM | POA: Diagnosis present

## 2021-03-12 DIAGNOSIS — C50511 Malignant neoplasm of lower-outer quadrant of right female breast: Secondary | ICD-10-CM | POA: Insufficient documentation

## 2021-03-12 IMAGING — CT CT CHEST-ABD-PELV W/ CM
2 of 5 series · 12 of 36 positions shown, 14 images · IV contrast (APPLIED)
Comparison: PET-CT [DATE], abdominal MRI [DATE] and chest
CT [DATE].

CLINICAL DATA: Invasive stage IV breast cancer diagnosed in [DATE]. Chemotherapy completed 4 months ago. Immediate therapy on
going. Assess treatment response.

EXAM:
CT CHEST, ABDOMEN, AND PELVIS WITH CONTRAST
TECHNIQUE: Multidetector CT imaging of the chest, abdomen and pelvis was
performed following the standard protocol during bolus
administration of intravenous contrast.

[Series 2: cap with · axial · 0.88mm/px · z∈[-483,+37]mm · 9 of 132 slices shown, 11 images]
[im 14/132  mediastinal]
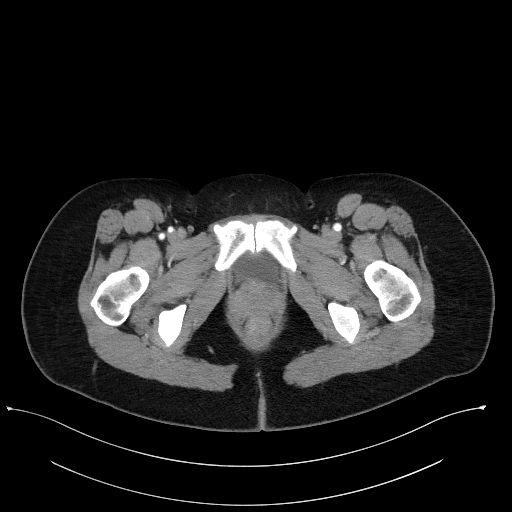
[im 14/132  bone]
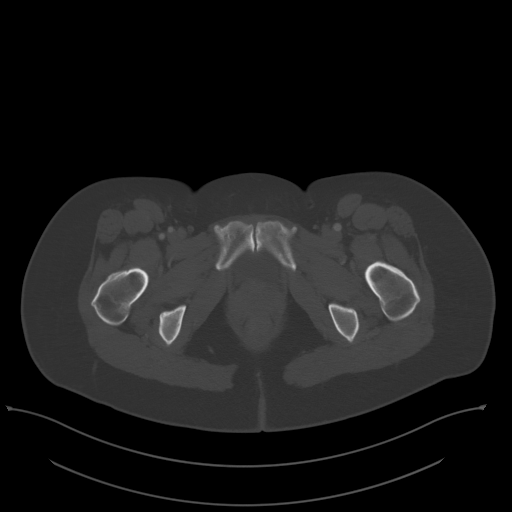
[im 27/132  mediastinal]
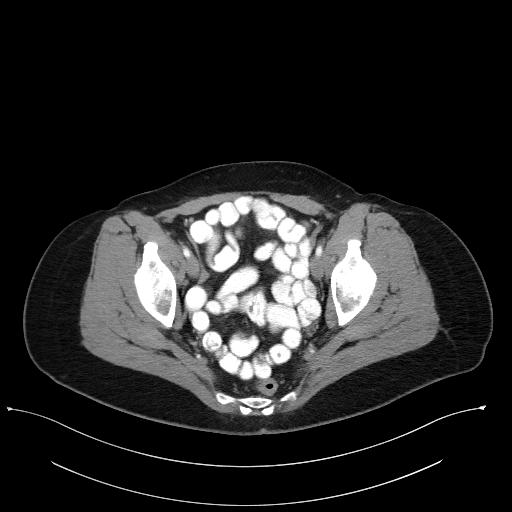
[im 40/132  mediastinal]
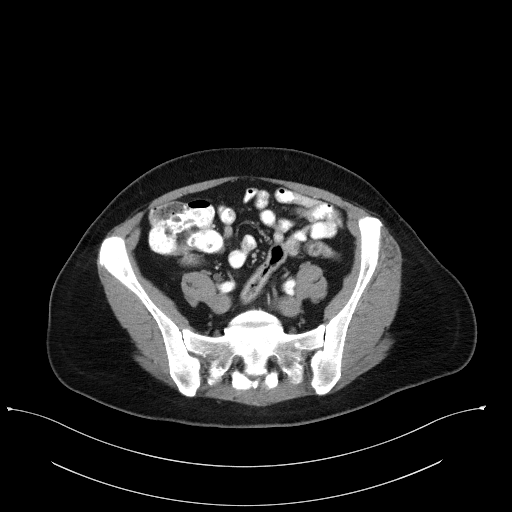
[im 53/132  mediastinal]
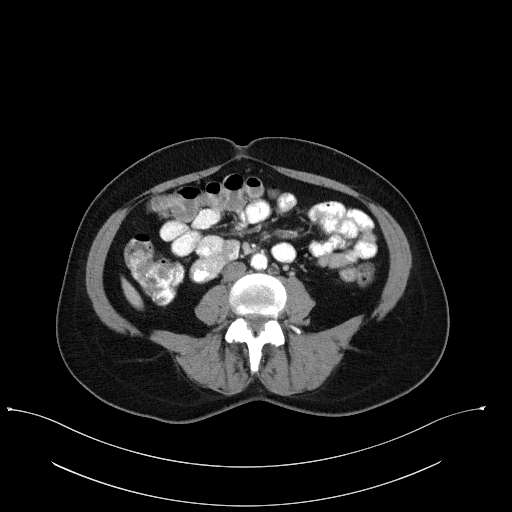
[im 66/132  mediastinal]
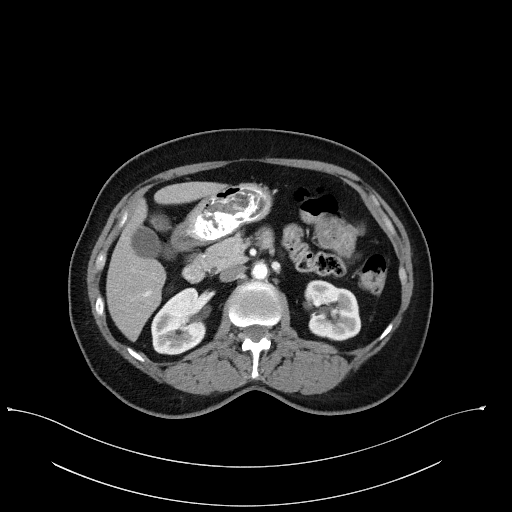
[im 79/132  mediastinal]
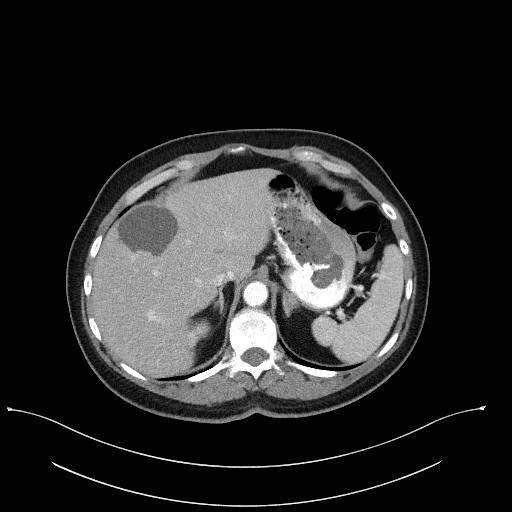
[im 92/132  mediastinal]
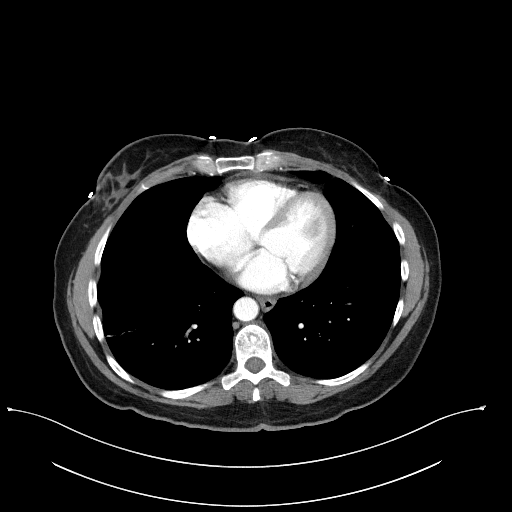
[im 105/132  mediastinal]
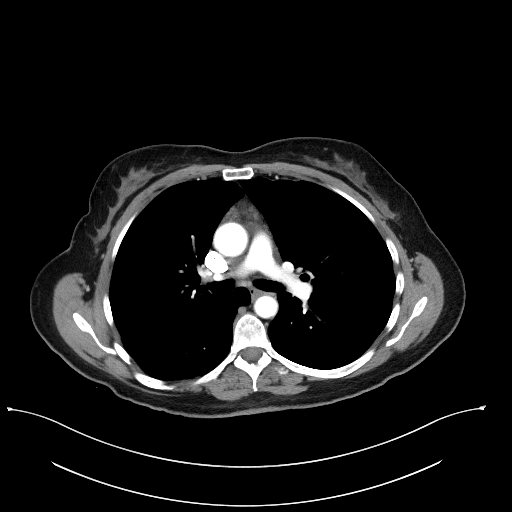
[im 118/132  mediastinal]
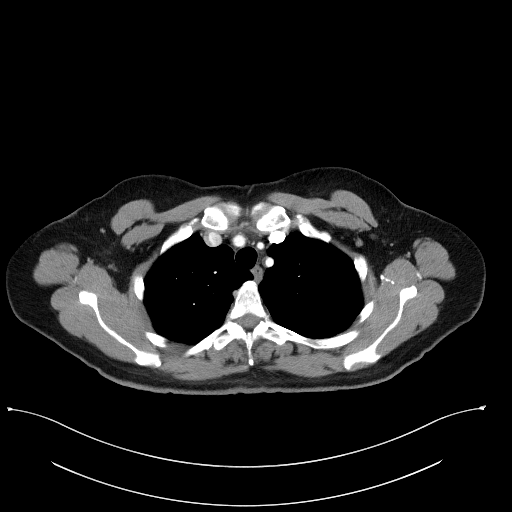
[im 118/132  bone]
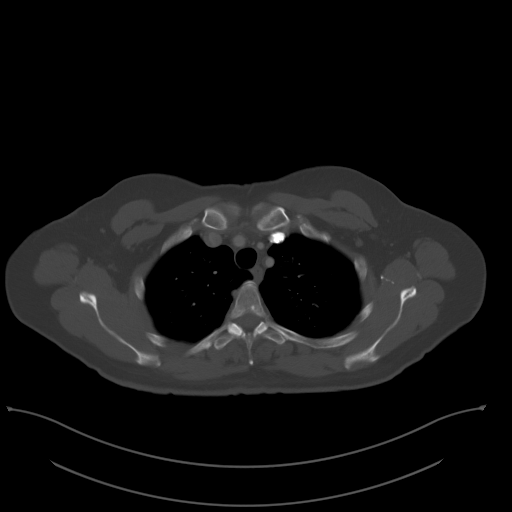

[Series 5: coronals · coronal · 0.74mm/px · 3 of 141 slices shown]
[im 29/141  mediastinal]
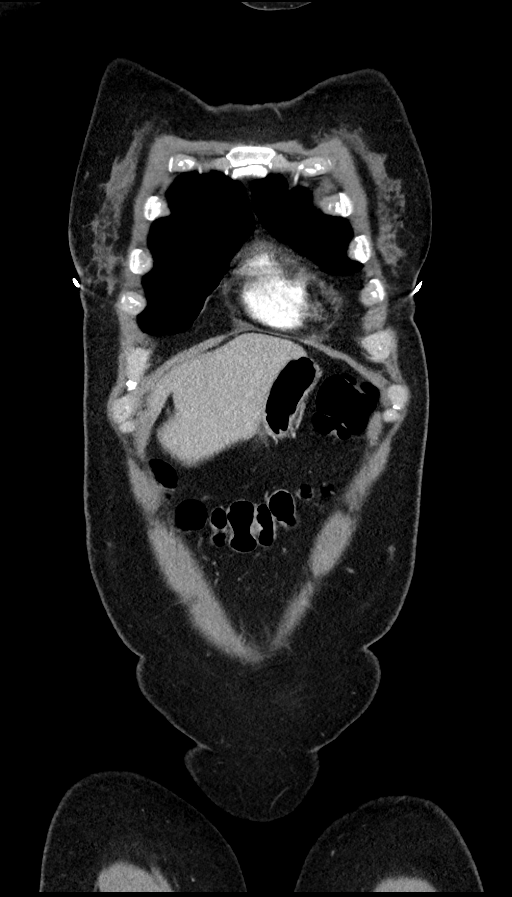
[im 57/141  mediastinal]
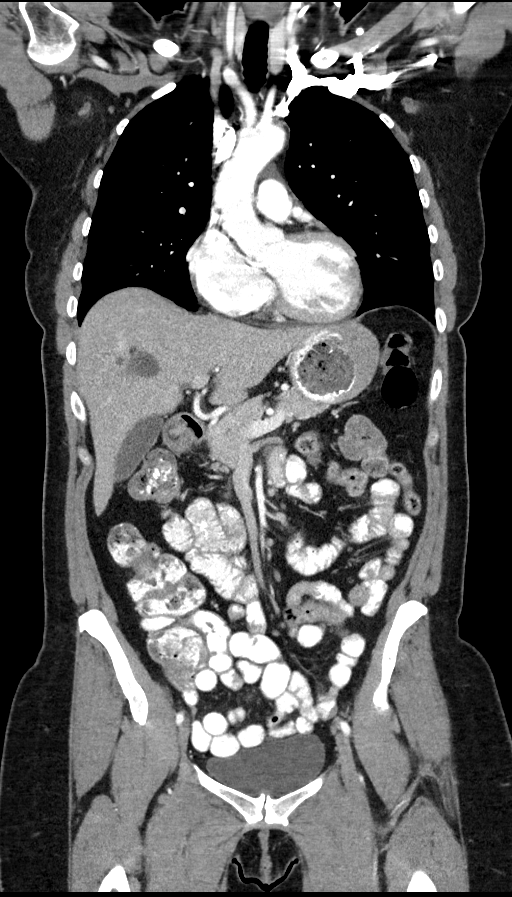
[im 85/141  mediastinal]
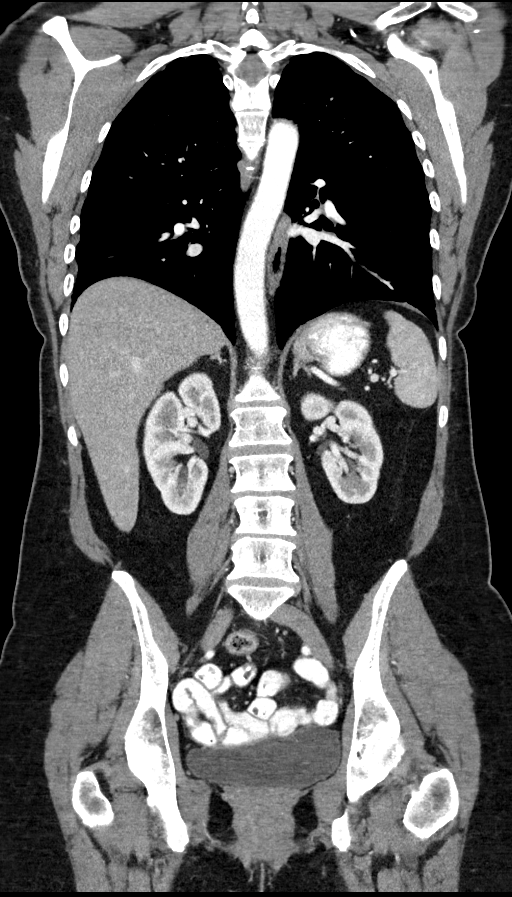

[12 of 36 positions shown; findings below may reference images not displayed]

RADIATION DOSE REDUCTION: This exam was performed according to the
departmental dose-optimization program which includes automated
exposure control, adjustment of the mA and/or kV according to
patient size and/or use of iterative reconstruction technique.

CONTRAST:  100mL OMNIPAQUE IOHEXOL 300 MG/ML  SOLN
FINDINGS: CT CHEST FINDINGS

Cardiovascular: No significant vascular findings. Left IJ
Port-A-Cath extends to the lower SVC level. The heart size is
normal. There is no pericardial effusion.

Mediastinum/Nodes: There are no enlarged mediastinal, hilar,
axillary or internal mammary lymph nodes. The previously
demonstrated right axillary and subpectoral lymph nodes have
resolved. A surgical clip is present within the right axilla. The
thyroid gland and trachea appear unremarkable. There is a stable
small hiatal hernia.

Lungs/Pleura: No pleural effusion or pneumothorax. There is a new
area of linear atelectasis anteriorly in the right lower lobe.
Additional areas of linear atelectasis or scarring at both lung
bases are unchanged. No suspicious pulmonary nodules.

Musculoskeletal/Chest wall: No chest wall mass. Predominately
sclerotic lesion involving the anterior left aspect of the T10
vertebral body is unchanged, not hypermetabolic on prior PET-CT and
probably an atypical hemangioma. However, there is an 8 mm sclerotic
lesion within the T7 vertebral body, best seen on image 100/6 which
was not demonstrated previously, suspicious for a treated
metastasis. No enlarging lytic lesion or pathologic fracture
identified.

CT ABDOMEN AND PELVIS FINDINGS

Hepatobiliary: Previously demonstrated dominant hepatic cysts are
stable, largest 5.8 cm anteriorly on image 55/2. The previously
demonstrated indeterminate lesion in the dome of the right hepatic
lobe is no longer visualized. More laterally in the dome of the
right lobe, there is a subtle hypervascular lesion measuring
approximately 7 mm on image 48/2 which is probably an incidental
transient hepatic attenuation difference (vascular shunt) based on
previous MRI. No new or enlarging hepatic lesions are identified. No
evidence of gallstones, gallbladder wall thickening or biliary
dilatation.

Pancreas: Unremarkable. No pancreatic ductal dilatation or
surrounding inflammatory changes.

Spleen: Normal in size without focal abnormality.

Adrenals/Urinary Tract: Both adrenal glands appear normal. There are
stable small renal cysts. No evidence of urinary tract calculus or
hydronephrosis. The bladder appears unremarkable for its degree of
distention.

Stomach/Bowel: Enteric contrast was administered and has passed into
the proximal colon. The stomach appears unremarkable for its degree
of distension. No evidence of bowel wall thickening, distention or
surrounding inflammatory change. Prominent stool throughout the
colon.

Vascular/Lymphatic: There are no enlarged abdominal or pelvic lymph
nodes. Mild aortic and branch vessel atherosclerosis. No acute
vascular findings.

Reproductive: Status post hysterectomy. Probable residual ovarian
tissue bilaterally, stable. No adnexal mass.

Other: No ascites or peritoneal nodularity. Small umbilical hernia
containing only fat.

Musculoskeletal: There are scattered sclerotic lesions within the
lumbar spine, most notable within the L1 vertebral body measuring 12
mm on image 62/2. This was lytic on previous PET-CT and is
consistent with a treated metastasis. No enlarging lytic lesions or
pathologic fractures identified. Mild lower lumbar spondylosis.
IMPRESSION: 1. Previously demonstrated right axillary and subpectoral lymph
nodes have resolved. No evidence of local recurrence or progressive
metastatic disease in the chest, abdomen or pelvis.
2. There are multiple newly sclerotic lesions within the spine which
were previously lytic, consistent with treated osseous metastases.
No evidence of pathologic fracture or epidural tumor.
3. The previously demonstrated indeterminate lesion within the dome
of the right hepatic lobe is no longer visualized. This could
reflect a treated metastasis or occult finding.
4. Stable incidental findings including hepatic and renal cysts and
Aortic Atherosclerosis ([BR]-[BR]).

## 2021-03-12 MED ORDER — SODIUM CHLORIDE (PF) 0.9 % IJ SOLN
INTRAMUSCULAR | Status: AC
  Filled 2021-03-12: qty 50

## 2021-03-12 MED ORDER — IOHEXOL 300 MG/ML  SOLN
100.0000 mL | Freq: Once | INTRAMUSCULAR | Status: AC | PRN
  Administered 2021-03-12: 100 mL via INTRAVENOUS

## 2021-03-14 NOTE — Progress Notes (Signed)
Patient Care Team: [facility], [facility] as PCP - General [name], MD as Consulting Physician (Obstetrics and Gynecology) Mauro Kaufmann, RN as Oncology Nurse Navigator Rockwell Germany, RN as Oncology Nurse Navigator Jovita Kussmaul, MD as Consulting Physician (General Surgery) Gery Pray, MD as Consulting Physician (Radiation Oncology) Raina Mina, RPH-CPP (Pharmacist) Larey Dresser, MD as Consulting Physician (Cardiology) Nicholas Lose, MD as Medical Oncologist (Hematology and Oncology)  DIAGNOSIS:    ICD-10-CM   1. Malignant neoplasm of lower-outer quadrant of right breast of female, estrogen receptor positive ([place])  C50.511    Z17.0       SUMMARY OF ONCOLOGIC HISTORY: Oncology History  Malignant neoplasm of lower-outer quadrant of right breast of female, estrogen receptor positive ([place])  03/22/2016 Surgery   left lumpectomy 03/22/2016 showing atypical lobular hyperplasia prophylactic tamoxifen taken from 04/2016 through 03/2019   06/06/2019 Genetic Testing   Negative genetic testing:  No pathogenic variants detected on the [facility] panel, ordered by Dr. Helane Rima at [facility]. Two variants of uncertain significance (VUS) were detected - one in the BRIP1 gene called c.2863A>C and a second in the NBN gene called c.643C>T. The report date is 06/06/2019.  The [facility] gene panel offered by [employer] includes sequencing and deletion/duplication testing of the following 35 genes: APC, ATM, AXIN2, BARD1, BMPR1A, BRCA1, BRCA2, BRIP1, CDH1, CDK4, CDKN2A, CHEK2, EPCAM (large rearrangement only), HOXB13 (sequencing only), GALNT12, MLH1, MSH2, MSH3 (excluding repetitive portions of exon 1), MSH6, MUTYH, NBN, NTHL1, PALB2, PMS2, PTEN, RAD51C, RAD51D, RNF43, RPS20, SMAD4, STK11, and TP53. Sequencing was performed for select regions of POLE and POLD1, and large rearrangement analysis was performed for select regions of GREM1.      06/25/2020 Relapse/Recurrence   right breast lower outer quadrant biopsy 06/25/2020 shows a clinical T2 N1, stage IB invasive ductal carcinoma, grade 2 or 3, triple positive, with an MIB-1 of 40%   07/02/2020 Cancer Staging   Staging form: Breast, AJCC 8th Edition - Clinical stage from 07/02/2020: Stage IV (cT2, cN1, cM1, G3, ER+, PR+, HER2+) - Signed by Chauncey Cruel, MD on 08/04/2020 Stage prefix: Initial diagnosis Histologic grading system: 3 grade system Laterality: Right Staged by: Pathologist and managing physician Stage used in treatment planning: Yes National guidelines used in treatment planning: Yes Type of national guideline used in treatment planning: NCCN    07/16/2020 - 09/30/2020 Chemotherapy   neoadjuvant chemotherapy consisting of docetaxel, carboplatin, trastuzumab and pertuzumab every 21 days x 4 cycles started 07/28/2020, last chemotherapy 09/30/2020       08/01/2020 PET scan   PET scan 08/01/2020 shows no hypermetabolism in the 1.2 cm right liver lesion; bony hypermetabolism consistent with known mets   08/19/2020 - 10/21/2020 Chemotherapy   Patient is on Treatment Plan : BREAST  Trastuzumab + Pertuzumab q21d      10/07/2020 Imaging    total spinal MRI 10/07/2020 shows stable to improved disease, no extraosseous extension, pathologic fracture, epidural or intracanalicular involvement MRI of the breast 10/19/2020 shows a complete imaging response in the breast, with new ring-enhancing lesions in the left breast with MRI guided biopsy 11/10/2020 showing no evidence of malignancy   11/11/2020 -  Chemotherapy   Patient is on Treatment Plan : BREAST Trastuzumab + Pertuzumab q21d     01/02/2021 Surgery   right lumpectomy with no sentinel lymph node sampling 01/02/2021 showed scattered foci of residual invasive ductal carcinoma, grade 2, the largest measuring 0.8 cm.  Margins were  negative   01/02/2021 -  Anti-estrogen oral therapy   anastrozole started 01/12/2021    Bone metastases ([place])  08/04/2020 Initial Diagnosis   Bone metastases ([place])   11/11/2020 -  Chemotherapy   Patient is on Treatment Plan : BREAST Trastuzumab + Pertuzumab q21d       CHIEF COMPLIANT: Herceptin Perjeta, anastrozole  INTERVAL HISTORY: Kim Archer is a 56 y.o. with above-mentioned history of metastatic breast cancer is currently on Herceptin and Perjeta maintenance. She has also been on anastrozole since January 2023. She presents to the clinic today for treatment.  Hot flashes from anastrozole.  Tolerating Herceptin Perjeta fairly well.  ALLERGIES:  is allergic to ciprofloxacin and sulfa antibiotics.  MEDICATIONS:  Current Outpatient Medications  Medication Sig Dispense Refill   anastrozole (ARIMIDEX) 1 MG tablet Take 1 tablet (1 mg total) by mouth daily. 30 tablet 3   anastrozole (ARIMIDEX) 1 MG tablet Take 1 tablet (1 mg total) by mouth daily. 90 tablet 4   Boron 3 MG CAPS Take by mouth.     Calcium 500 MG tablet 1,000 mg.     ibuprofen (ADVIL) 800 MG tablet ibuprofen 800 mg tablet  TAKE 1 TABLET BY MOUTH THREE TIMES A DAY     ipratropium (ATROVENT) 0.06 % nasal spray Place into both nostrils.     levOCARNitine (CARNITINE PO) Take 1 capsule by mouth daily. L-Carnitine     lidocaine-prilocaine (EMLA) cream Apply topically daily.     loratadine (CLARITIN) 10 MG tablet Take 10 mg by mouth daily.     LUTEIN PO Take by mouth.     MAGNESIUM PO Take 1 tablet by mouth at bedtime.     Menatetrenone (VITAMIN K2) 100 MCG TABS Take by mouth.     Multiple Vitamin (MULTIVITAMIN WITH MINERALS) TABS tablet Take 1 tablet by mouth daily.     OVER THE COUNTER MEDICATION Take 1 capsule by mouth daily. [place] Mane Mushroom 1245m     Ubiquinol 50 MG CAPS Take by mouth daily.     VITAMIN D PO Vitamin D     zinc sulfate 220 (50 Zn) MG capsule Take 220 mg by mouth daily.     No current facility-administered medications for this visit.    PHYSICAL EXAMINATION: ECOG PERFORMANCE  STATUS: 1 - Symptomatic but completely ambulatory  Vitals:   03/16/21 0907  BP: (!) 108/50  Pulse: 70  Resp: 16  Temp: 97.6 F (36.4 C)  SpO2: 99%   Filed Weights   03/16/21 0907  Weight: 176 lb 4 oz (79.9 kg)    LABORATORY DATA:  I have reviewed the data as listed CMP Latest Ref Rng & Units 02/23/2021 02/02/2021 01/12/2021  Glucose 70 - 99 mg/dL 100(H) 99 84  BUN 6 - 20 mg/dL '15 17 14  ' Creatinine 0.44 - 1.00 mg/dL 0.80 0.81 0.80  Sodium 135 - 145 mmol/L 143 145 144  Potassium 3.5 - 5.1 mmol/L 3.9 3.8 3.9  Chloride 98 - 111 mmol/L 108 110 108  CO2 22 - 32 mmol/L '28 25 25  ' Calcium 8.9 - 10.3 mg/dL 9.7 9.2 9.3  Total Protein 6.5 - 8.1 g/dL 6.9 7.1 6.9  Total Bilirubin 0.3 - 1.2 mg/dL 0.4 0.3 0.3  Alkaline Phos 38 - 126 U/L 79 89 86  AST 15 - 41 U/L '18 24 26  ' ALT 0 - 44 U/L 26 31 35    Lab Results  Component Value Date   WBC 5.7 03/16/2021   HGB 12.9  03/16/2021   HCT 38.7 03/16/2021   MCV 92.6 03/16/2021   PLT 216 03/16/2021   NEUTROABS 3.0 03/16/2021    ASSESSMENT & PLAN:  Malignant neoplasm of lower-outer quadrant of right breast of female, estrogen receptor positive ([place]) Metastatic Breast cancer ER/PR and Her 2 Positive: S/p NAC with TCHP and BCS Current Treatment: HP maintenance with Anastrozole and Xgeva   10/08/20: Scattered osseous metastatic disease involving the thoracolumbar spine as detailed above, somewhat less pronounced as compared to previous abdominal MRI from 07/26/2020, and may be improved. Most prominent of these lesions measures 2 cm at the T10 vertebral body.  03/14/2021: CT CAP: Previously demonstrated right axillary and subpectoral lymph nodes resolved.  Bone metastases have become sclerotic suggestive of response to treatment.  Indeterminate lesion right hepatic lobe: Not visualized  Continue with Herceptin Perjeta maintenance along with anastrozole and Xgeva Zometa will be every 3 months.  Chronic allergies sinusitis and dizziness: We will  refer her to [facility] for evaluation. Return to clinic every 3 weeks for Herceptin Perjeta and every 6 weeks to follow-up with me.   No orders of the defined types were placed in this encounter.  The patient has a good understanding of the overall plan. she agrees with it. she will call with any problems that may develop before the next visit here.  Total time spent: 30 mins including face to face time and time spent for planning, charting and coordination of care  Rulon Eisenmenger, MD, MPH 03/16/2021  I, Thana Ates, am acting as scribe for Dr. Nicholas Lose.  I have reviewed the above documentation for accuracy and completeness, and I agree with the above.

## 2021-03-16 ENCOUNTER — Other Ambulatory Visit: Payer: Self-pay

## 2021-03-16 ENCOUNTER — Other Ambulatory Visit: Payer: Self-pay | Admitting: Hematology and Oncology

## 2021-03-16 ENCOUNTER — Encounter: Payer: Self-pay | Admitting: *Deleted

## 2021-03-16 ENCOUNTER — Inpatient Hospital Stay: Payer: Commercial Managed Care - PPO

## 2021-03-16 ENCOUNTER — Inpatient Hospital Stay (HOSPITAL_BASED_OUTPATIENT_CLINIC_OR_DEPARTMENT_OTHER): Payer: Commercial Managed Care - PPO | Admitting: Hematology and Oncology

## 2021-03-16 ENCOUNTER — Ambulatory Visit: Payer: 59

## 2021-03-16 ENCOUNTER — Other Ambulatory Visit: Payer: 59

## 2021-03-16 VITALS — BP 125/66 | HR 69 | Temp 98.3°F | Resp 16

## 2021-03-16 DIAGNOSIS — Z170 Estrogen receptor positive status [ER+]: Secondary | ICD-10-CM

## 2021-03-16 DIAGNOSIS — C50511 Malignant neoplasm of lower-outer quadrant of right female breast: Secondary | ICD-10-CM

## 2021-03-16 DIAGNOSIS — Z5112 Encounter for antineoplastic immunotherapy: Secondary | ICD-10-CM | POA: Diagnosis not present

## 2021-03-16 DIAGNOSIS — C7951 Secondary malignant neoplasm of bone: Secondary | ICD-10-CM

## 2021-03-16 DIAGNOSIS — R42 Dizziness and giddiness: Secondary | ICD-10-CM

## 2021-03-16 DIAGNOSIS — Z95828 Presence of other vascular implants and grafts: Secondary | ICD-10-CM

## 2021-03-16 LAB — CBC WITH DIFFERENTIAL/PLATELET
Abs Immature Granulocytes: 0.03 10*3/uL (ref 0.00–0.07)
Basophils Absolute: 0 10*3/uL (ref 0.0–0.1)
Basophils Relative: 1 %
Eosinophils Absolute: 0.4 10*3/uL (ref 0.0–0.5)
Eosinophils Relative: 7 %
HCT: 38.7 % (ref 36.0–46.0)
Hemoglobin: 12.9 g/dL (ref 12.0–15.0)
Immature Granulocytes: 1 %
Lymphocytes Relative: 32 %
Lymphs Abs: 1.8 10*3/uL (ref 0.7–4.0)
MCH: 30.9 pg (ref 26.0–34.0)
MCHC: 33.3 g/dL (ref 30.0–36.0)
MCV: 92.6 fL (ref 80.0–100.0)
Monocytes Absolute: 0.3 10*3/uL (ref 0.1–1.0)
Monocytes Relative: 6 %
Neutro Abs: 3 10*3/uL (ref 1.7–7.7)
Neutrophils Relative %: 53 %
Platelets: 216 10*3/uL (ref 150–400)
RBC: 4.18 MIL/uL (ref 3.87–5.11)
RDW: 13.2 % (ref 11.5–15.5)
WBC: 5.7 10*3/uL (ref 4.0–10.5)
nRBC: 0 % (ref 0.0–0.2)

## 2021-03-16 LAB — COMPREHENSIVE METABOLIC PANEL
ALT: 21 U/L (ref 0–44)
AST: 16 U/L (ref 15–41)
Albumin: 4.2 g/dL (ref 3.5–5.0)
Alkaline Phosphatase: 82 U/L (ref 38–126)
Anion gap: 5 (ref 5–15)
BUN: 9 mg/dL (ref 6–20)
CO2: 29 mmol/L (ref 22–32)
Calcium: 9.7 mg/dL (ref 8.9–10.3)
Chloride: 111 mmol/L (ref 98–111)
Creatinine, Ser: 0.82 mg/dL (ref 0.44–1.00)
GFR, Estimated: >60 mL/min (ref >60)
Glucose, Bld: 89 mg/dL (ref 70–99)
Potassium: 3.9 mmol/L (ref 3.5–5.1)
Sodium: 145 mmol/L (ref 135–145)
Total Bilirubin: 0.4 mg/dL (ref 0.3–1.2)
Total Protein: 6.9 g/dL (ref 6.5–8.1)

## 2021-03-16 MED ORDER — TRASTUZUMAB-ANNS CHEMO 150 MG IV SOLR
6.0000 mg/kg | Freq: Once | INTRAVENOUS | Status: AC
  Administered 2021-03-16: 462 mg via INTRAVENOUS
  Filled 2021-03-16: qty 22

## 2021-03-16 MED ORDER — ACETAMINOPHEN 325 MG PO TABS
650.0000 mg | ORAL_TABLET | Freq: Once | ORAL | Status: AC
  Administered 2021-03-16: 650 mg via ORAL
  Filled 2021-03-16: qty 2

## 2021-03-16 MED ORDER — SODIUM CHLORIDE 0.9% FLUSH
10.0000 mL | INTRAVENOUS | Status: AC | PRN
  Administered 2021-03-16: 10 mL

## 2021-03-16 MED ORDER — SODIUM CHLORIDE 0.9 % IV SOLN: Freq: Once | INTRAVENOUS | Status: AC

## 2021-03-16 MED ORDER — DIPHENHYDRAMINE HCL 25 MG PO CAPS
25.0000 mg | ORAL_CAPSULE | Freq: Once | ORAL | Status: AC
  Administered 2021-03-16: 25 mg via ORAL
  Filled 2021-03-16: qty 1

## 2021-03-16 MED ORDER — SODIUM CHLORIDE 0.9 % IV SOLN
420.0000 mg | Freq: Once | INTRAVENOUS | Status: AC
  Administered 2021-03-16: 420 mg via INTRAVENOUS
  Filled 2021-03-16: qty 14

## 2021-03-16 MED ORDER — HEPARIN SOD (PORK) LOCK FLUSH 100 UNIT/ML IV SOLN
500.0000 [IU] | Freq: Once | INTRAVENOUS | Status: AC | PRN
  Administered 2021-03-16: 500 [IU]

## 2021-03-16 MED ORDER — SODIUM CHLORIDE 0.9% FLUSH
10.0000 mL | INTRAVENOUS | Status: DC | PRN
  Administered 2021-03-16: 10 mL

## 2021-03-16 NOTE — Progress Notes (Signed)
Faxed referral to [facility] ENT per MD, [phone]

## 2021-03-16 NOTE — Assessment & Plan Note (Signed)
Metastatic Breast cancer ER/PR and Her 2 Positive: S/p NAC with TCHP and BCS Current Treatment: HP maintenance with Anastrozole and Xgeva  10/08/20: Scattered osseous metastatic disease involving the thoracolumbar spine as detailed above, somewhat less pronounced as compared to previous abdominal MRI from 07/26/2020, and may be improved. Most prominent of these lesions measures 2 cm at the T10 vertebral body.  03/14/2021: CT CAP: Previously demonstrated right axillary and subpectoral lymph nodes resolved.  Bone metastases have become sclerotic suggestive of response to treatment.  Indeterminate lesion right hepatic lobe: Not visualized  Continue with Herceptin Perjeta maintenance along with anastrozole and Delton See

## 2021-03-16 NOTE — Patient Instructions (Signed)
[place] ONCOLOGY  Discharge Instructions: Thank you for choosing [facility] to provide your oncology and hematology care.   If you have a lab appointment with the [facility], please go directly to the [facility] and check in at the registration area.   Wear comfortable clothing and clothing appropriate for easy access to any Portacath or PICC line.   We strive to give you quality time with your provider. You may need to reschedule your appointment if you arrive late (15 or more minutes).  Arriving late affects you and other patients whose appointments are after yours.  Also, if you miss three or more appointments without notifying the office, you may be dismissed from the clinic at the providers discretion.      For prescription refill requests, have your pharmacy contact our office and allow 72 hours for refills to be completed.    Today you received the following chemotherapy and/or immunotherapy agents: Kanjinti & Perjerta   To help prevent nausea and vomiting after your treatment, we encourage you to take your nausea medication as directed.  BELOW ARE SYMPTOMS THAT SHOULD BE REPORTED IMMEDIATELY: *FEVER GREATER THAN 100.4 F (38 C) OR HIGHER *CHILLS OR SWEATING *NAUSEA AND VOMITING THAT IS NOT CONTROLLED WITH YOUR NAUSEA MEDICATION *UNUSUAL SHORTNESS OF BREATH *UNUSUAL BRUISING OR BLEEDING *URINARY PROBLEMS (pain or burning when urinating, or frequent urination) *BOWEL PROBLEMS (unusual diarrhea, constipation, pain near the anus) TENDERNESS IN MOUTH AND THROAT WITH OR WITHOUT PRESENCE OF ULCERS (sore throat, sores in mouth, or a toothache) UNUSUAL RASH, SWELLING OR PAIN  UNUSUAL VAGINAL DISCHARGE OR ITCHING   Items with * indicate a potential emergency and should be followed up as soon as possible or go to the Emergency Department if any problems should occur.  Please show the CHEMOTHERAPY ALERT CARD or IMMUNOTHERAPY ALERT CARD at  check-in to the Emergency Department and triage nurse.  Should you have questions after your visit or need to cancel or reschedule your appointment, please contact [facility]  Dept: [phone]  and follow the prompts.  Office hours are 8:00 a.m. to 4:30 p.m. Monday - Friday. Please note that voicemails left after 4:00 p.m. may not be returned until the following business day.  We are closed weekends and major holidays. You have access to a nurse at all times for urgent questions. Please call the main number to the clinic Dept: [phone] and follow the prompts.   For any non-urgent questions, you may also contact your provider using MyChart. We now offer e-Visits for anyone 74 and older to request care online for non-urgent symptoms. For details visit [email or web address].   Also download the MyChart app! Go to the app store, search "MyChart", open the app, select [facility], and log in with your MyChart username and password.  Due to Covid, a mask is required upon entering the hospital/clinic. If you do not have a mask, one will be given to you upon arrival. For doctor visits, patients may have 1 support person aged 18 or older with them. For treatment visits, patients cannot have anyone with them due to current Covid guidelines and our immunocompromised population.

## 2021-03-20 ENCOUNTER — Other Ambulatory Visit: Payer: Self-pay | Admitting: *Deleted

## 2021-03-30 ENCOUNTER — Encounter: Payer: Self-pay | Admitting: *Deleted

## 2021-04-06 ENCOUNTER — Encounter: Payer: Self-pay | Admitting: Hematology and Oncology

## 2021-04-07 ENCOUNTER — Inpatient Hospital Stay: Payer: Commercial Managed Care - PPO

## 2021-04-07 ENCOUNTER — Other Ambulatory Visit: Payer: Self-pay

## 2021-04-07 ENCOUNTER — Inpatient Hospital Stay: Payer: Commercial Managed Care - PPO | Attending: Hematology and Oncology

## 2021-04-07 VITALS — BP 115/71 | HR 59 | Temp 98.3°F | Resp 16 | Wt 177.2 lb

## 2021-04-07 DIAGNOSIS — Z79899 Other long term (current) drug therapy: Secondary | ICD-10-CM | POA: Diagnosis not present

## 2021-04-07 DIAGNOSIS — C7951 Secondary malignant neoplasm of bone: Secondary | ICD-10-CM | POA: Diagnosis not present

## 2021-04-07 DIAGNOSIS — Z5112 Encounter for antineoplastic immunotherapy: Secondary | ICD-10-CM | POA: Diagnosis present

## 2021-04-07 DIAGNOSIS — C50511 Malignant neoplasm of lower-outer quadrant of right female breast: Secondary | ICD-10-CM | POA: Insufficient documentation

## 2021-04-07 DIAGNOSIS — Z95828 Presence of other vascular implants and grafts: Secondary | ICD-10-CM

## 2021-04-07 DIAGNOSIS — Z170 Estrogen receptor positive status [ER+]: Secondary | ICD-10-CM | POA: Insufficient documentation

## 2021-04-07 LAB — CBC WITH DIFFERENTIAL/PLATELET
Abs Immature Granulocytes: 0.02 10*3/uL (ref 0.00–0.07)
Basophils Absolute: 0 10*3/uL (ref 0.0–0.1)
Basophils Relative: 0 %
Eosinophils Absolute: 0.3 10*3/uL (ref 0.0–0.5)
Eosinophils Relative: 4 %
HCT: 38.6 % (ref 36.0–46.0)
Hemoglobin: 12.8 g/dL (ref 12.0–15.0)
Immature Granulocytes: 0 %
Lymphocytes Relative: 25 %
Lymphs Abs: 1.7 10*3/uL (ref 0.7–4.0)
MCH: 31.1 pg (ref 26.0–34.0)
MCHC: 33.2 g/dL (ref 30.0–36.0)
MCV: 93.7 fL (ref 80.0–100.0)
Monocytes Absolute: 0.3 10*3/uL (ref 0.1–1.0)
Monocytes Relative: 5 %
Neutro Abs: 4.3 10*3/uL (ref 1.7–7.7)
Neutrophils Relative %: 66 %
Platelets: 218 10*3/uL (ref 150–400)
RBC: 4.12 MIL/uL (ref 3.87–5.11)
RDW: 13 % (ref 11.5–15.5)
WBC: 6.7 10*3/uL (ref 4.0–10.5)
nRBC: 0 % (ref 0.0–0.2)

## 2021-04-07 LAB — COMPREHENSIVE METABOLIC PANEL
ALT: 22 U/L (ref 0–44)
AST: 18 U/L (ref 15–41)
Albumin: 4.3 g/dL (ref 3.5–5.0)
Alkaline Phosphatase: 95 U/L (ref 38–126)
Anion gap: 7 (ref 5–15)
BUN: 15 mg/dL (ref 6–20)
CO2: 29 mmol/L (ref 22–32)
Calcium: 9.6 mg/dL (ref 8.9–10.3)
Chloride: 107 mmol/L (ref 98–111)
Creatinine, Ser: 0.84 mg/dL (ref 0.44–1.00)
GFR, Estimated: >60 mL/min (ref >60)
Glucose, Bld: 109 mg/dL — ABNORMAL HIGH (ref 70–99)
Potassium: 3.9 mmol/L (ref 3.5–5.1)
Sodium: 143 mmol/L (ref 135–145)
Total Bilirubin: 0.5 mg/dL (ref 0.3–1.2)
Total Protein: 7.1 g/dL (ref 6.5–8.1)

## 2021-04-07 MED ORDER — SODIUM CHLORIDE 0.9 % IV SOLN
420.0000 mg | Freq: Once | INTRAVENOUS | Status: AC
  Administered 2021-04-07: 420 mg via INTRAVENOUS
  Filled 2021-04-07: qty 14

## 2021-04-07 MED ORDER — HEPARIN SOD (PORK) LOCK FLUSH 100 UNIT/ML IV SOLN
500.0000 [IU] | Freq: Once | INTRAVENOUS | Status: AC | PRN
  Administered 2021-04-07: 500 [IU]

## 2021-04-07 MED ORDER — ACETAMINOPHEN 325 MG PO TABS
650.0000 mg | ORAL_TABLET | Freq: Once | ORAL | Status: AC
  Administered 2021-04-07: 650 mg via ORAL
  Filled 2021-04-07: qty 2

## 2021-04-07 MED ORDER — SODIUM CHLORIDE 0.9 % IV SOLN: Freq: Once | INTRAVENOUS | Status: AC

## 2021-04-07 MED ORDER — DIPHENHYDRAMINE HCL 25 MG PO CAPS
25.0000 mg | ORAL_CAPSULE | Freq: Once | ORAL | Status: AC
  Administered 2021-04-07: 25 mg via ORAL
  Filled 2021-04-07: qty 1

## 2021-04-07 MED ORDER — TRASTUZUMAB-ANNS CHEMO 150 MG IV SOLR
6.0000 mg/kg | Freq: Once | INTRAVENOUS | Status: AC
  Administered 2021-04-07: 462 mg via INTRAVENOUS
  Filled 2021-04-07: qty 22

## 2021-04-07 MED ORDER — SODIUM CHLORIDE 0.9% FLUSH
10.0000 mL | INTRAVENOUS | Status: DC | PRN
  Administered 2021-04-07: 10 mL via INTRAVENOUS

## 2021-04-07 MED ORDER — SODIUM CHLORIDE 0.9% FLUSH
10.0000 mL | INTRAVENOUS | Status: DC | PRN
  Administered 2021-04-07: 10 mL

## 2021-04-07 NOTE — Patient Instructions (Signed)
[facility] ONCOLOGY  Discharge Instructions: Thank you for choosing [facility] to provide your oncology and hematology care.   If you have a lab appointment with the [facility], please go directly to the [facility] and check in at the registration area.   Wear comfortable clothing and clothing appropriate for easy access to any Portacath or PICC line.   We strive to give you quality time with your provider. You may need to reschedule your appointment if you arrive late (15 or more minutes).  Arriving late affects you and other patients whose appointments are after yours.  Also, if you miss three or more appointments without notifying the office, you may be dismissed from the clinic at the providers discretion.      For prescription refill requests, have your pharmacy contact our office and allow 72 hours for refills to be completed.    Today you received the following chemotherapy and/or immunotherapy agents: Trastuzumab (Kanjinti) and Pertuzumab (Perjeta).   To help prevent nausea and vomiting after your treatment, we encourage you to take your nausea medication as directed.  BELOW ARE SYMPTOMS THAT SHOULD BE REPORTED IMMEDIATELY: *FEVER GREATER THAN 100.4 F (38 C) OR HIGHER *CHILLS OR SWEATING *NAUSEA AND VOMITING THAT IS NOT CONTROLLED WITH YOUR NAUSEA MEDICATION *UNUSUAL SHORTNESS OF BREATH *UNUSUAL BRUISING OR BLEEDING *URINARY PROBLEMS (pain or burning when urinating, or frequent urination) *BOWEL PROBLEMS (unusual diarrhea, constipation, pain near the anus) TENDERNESS IN MOUTH AND THROAT WITH OR WITHOUT PRESENCE OF ULCERS (sore throat, sores in mouth, or a toothache) UNUSUAL RASH, SWELLING OR PAIN  UNUSUAL VAGINAL DISCHARGE OR ITCHING   Items with * indicate a potential emergency and should be followed up as soon as possible or go to the Emergency Department if any problems should occur.  Please show the CHEMOTHERAPY ALERT CARD or  IMMUNOTHERAPY ALERT CARD at check-in to the Emergency Department and triage nurse.  Should you have questions after your visit or need to cancel or reschedule your appointment, please contact [facility]  Dept: [phone]  and follow the prompts.  Office hours are 8:00 a.m. to 4:30 p.m. Monday - Friday. Please note that voicemails left after 4:00 p.m. may not be returned until the following business day.  We are closed weekends and major holidays. You have access to a nurse at all times for urgent questions. Please call the main number to the clinic Dept: [phone] and follow the prompts.   For any non-urgent questions, you may also contact your provider using MyChart. We now offer e-Visits for anyone 23 and older to request care online for non-urgent symptoms. For details visit [email or web address].   Also download the MyChart app! Go to the app store, search "MyChart", open the app, select [facility], and log in with your MyChart username and password.  Due to Covid, a mask is required upon entering the hospital/clinic. If you do not have a mask, one will be given to you upon arrival. For doctor visits, patients may have 1 support person aged 68 or older with them. For treatment visits, patients cannot have anyone with them due to current Covid guidelines and our immunocompromised population.

## 2021-04-14 ENCOUNTER — Encounter (HOSPITAL_COMMUNITY): Payer: Self-pay

## 2021-04-14 ENCOUNTER — Other Ambulatory Visit: Payer: Self-pay | Admitting: Adult Health

## 2021-04-15 ENCOUNTER — Encounter: Payer: Self-pay | Admitting: Hematology and Oncology

## 2021-04-28 ENCOUNTER — Other Ambulatory Visit: Payer: Self-pay

## 2021-04-28 ENCOUNTER — Inpatient Hospital Stay: Payer: Commercial Managed Care - PPO | Attending: Hematology and Oncology

## 2021-04-28 ENCOUNTER — Encounter: Payer: Self-pay | Admitting: Adult Health

## 2021-04-28 ENCOUNTER — Inpatient Hospital Stay: Payer: Commercial Managed Care - PPO

## 2021-04-28 ENCOUNTER — Inpatient Hospital Stay (HOSPITAL_BASED_OUTPATIENT_CLINIC_OR_DEPARTMENT_OTHER): Payer: Commercial Managed Care - PPO | Admitting: Adult Health

## 2021-04-28 VITALS — BP 114/61 | HR 65 | Temp 97.7°F | Resp 16 | Ht 67.0 in | Wt 178.0 lb

## 2021-04-28 VITALS — BP 118/75 | HR 68 | Temp 97.8°F | Resp 18

## 2021-04-28 DIAGNOSIS — Z170 Estrogen receptor positive status [ER+]: Secondary | ICD-10-CM

## 2021-04-28 DIAGNOSIS — C7951 Secondary malignant neoplasm of bone: Secondary | ICD-10-CM | POA: Diagnosis not present

## 2021-04-28 DIAGNOSIS — Z95828 Presence of other vascular implants and grafts: Secondary | ICD-10-CM

## 2021-04-28 DIAGNOSIS — C50511 Malignant neoplasm of lower-outer quadrant of right female breast: Secondary | ICD-10-CM

## 2021-04-28 DIAGNOSIS — B009 Herpesviral infection, unspecified: Secondary | ICD-10-CM | POA: Insufficient documentation

## 2021-04-28 DIAGNOSIS — Z5112 Encounter for antineoplastic immunotherapy: Secondary | ICD-10-CM | POA: Diagnosis not present

## 2021-04-28 DIAGNOSIS — Z79899 Other long term (current) drug therapy: Secondary | ICD-10-CM | POA: Diagnosis not present

## 2021-04-28 DIAGNOSIS — I700 Atherosclerosis of aorta: Secondary | ICD-10-CM

## 2021-04-28 DIAGNOSIS — M899 Disorder of bone, unspecified: Secondary | ICD-10-CM

## 2021-04-28 DIAGNOSIS — Z79811 Long term (current) use of aromatase inhibitors: Secondary | ICD-10-CM | POA: Diagnosis not present

## 2021-04-28 DIAGNOSIS — Z7189 Other specified counseling: Secondary | ICD-10-CM

## 2021-04-28 LAB — CBC WITH DIFFERENTIAL/PLATELET
Abs Immature Granulocytes: 0.01 10*3/uL (ref 0.00–0.07)
Basophils Absolute: 0 10*3/uL (ref 0.0–0.1)
Basophils Relative: 0 %
Eosinophils Absolute: 0.3 10*3/uL (ref 0.0–0.5)
Eosinophils Relative: 6 %
HCT: 39.5 % (ref 36.0–46.0)
Hemoglobin: 13.1 g/dL (ref 12.0–15.0)
Immature Granulocytes: 0 %
Lymphocytes Relative: 37 %
Lymphs Abs: 1.9 10*3/uL (ref 0.7–4.0)
MCH: 31 pg (ref 26.0–34.0)
MCHC: 33.2 g/dL (ref 30.0–36.0)
MCV: 93.4 fL (ref 80.0–100.0)
Monocytes Absolute: 0.2 10*3/uL (ref 0.1–1.0)
Monocytes Relative: 5 %
Neutro Abs: 2.6 10*3/uL (ref 1.7–7.7)
Neutrophils Relative %: 52 %
Platelets: 215 10*3/uL (ref 150–400)
RBC: 4.23 MIL/uL (ref 3.87–5.11)
RDW: 12.3 % (ref 11.5–15.5)
WBC: 5 10*3/uL (ref 4.0–10.5)
nRBC: 0 % (ref 0.0–0.2)

## 2021-04-28 LAB — COMPREHENSIVE METABOLIC PANEL
ALT: 32 U/L (ref 0–44)
AST: 20 U/L (ref 15–41)
Albumin: 4.2 g/dL (ref 3.5–5.0)
Alkaline Phosphatase: 90 U/L (ref 38–126)
Anion gap: 9 (ref 5–15)
BUN: 15 mg/dL (ref 6–20)
CO2: 27 mmol/L (ref 22–32)
Calcium: 9.8 mg/dL (ref 8.9–10.3)
Chloride: 107 mmol/L (ref 98–111)
Creatinine, Ser: 0.83 mg/dL (ref 0.44–1.00)
GFR, Estimated: >60 mL/min (ref >60)
Glucose, Bld: 131 mg/dL — ABNORMAL HIGH (ref 70–99)
Potassium: 3.6 mmol/L (ref 3.5–5.1)
Sodium: 143 mmol/L (ref 135–145)
Total Bilirubin: 0.4 mg/dL (ref 0.3–1.2)
Total Protein: 6.8 g/dL (ref 6.5–8.1)

## 2021-04-28 MED ORDER — HEPARIN SOD (PORK) LOCK FLUSH 100 UNIT/ML IV SOLN
500.0000 [IU] | Freq: Once | INTRAVENOUS | Status: AC | PRN
  Administered 2021-04-28: 500 [IU]

## 2021-04-28 MED ORDER — SODIUM CHLORIDE 0.9% FLUSH
10.0000 mL | INTRAVENOUS | Status: AC | PRN
  Administered 2021-04-28: 10 mL

## 2021-04-28 MED ORDER — SODIUM CHLORIDE 0.9% FLUSH
10.0000 mL | INTRAVENOUS | Status: DC | PRN
  Administered 2021-04-28: 10 mL

## 2021-04-28 MED ORDER — DIPHENHYDRAMINE HCL 25 MG PO CAPS
25.0000 mg | ORAL_CAPSULE | Freq: Once | ORAL | Status: AC
  Administered 2021-04-28: 25 mg via ORAL
  Filled 2021-04-28: qty 1

## 2021-04-28 MED ORDER — ANASTROZOLE 1 MG PO TABS: 1.0000 mg | ORAL_TABLET | Freq: Every day | ORAL | 3 refills | Status: DC

## 2021-04-28 MED ORDER — SODIUM CHLORIDE 0.9 % IV SOLN: Freq: Once | INTRAVENOUS | Status: AC

## 2021-04-28 MED ORDER — ACETAMINOPHEN 325 MG PO TABS
650.0000 mg | ORAL_TABLET | Freq: Once | ORAL | Status: AC
  Administered 2021-04-28: 650 mg via ORAL
  Filled 2021-04-28: qty 2

## 2021-04-28 MED ORDER — ZOLEDRONIC ACID 4 MG/100ML IV SOLN
4.0000 mg | Freq: Once | INTRAVENOUS | Status: AC
  Administered 2021-04-28: 4 mg via INTRAVENOUS
  Filled 2021-04-28: qty 100

## 2021-04-28 MED ORDER — TRASTUZUMAB-ANNS CHEMO 150 MG IV SOLR
6.0000 mg/kg | Freq: Once | INTRAVENOUS | Status: AC
  Administered 2021-04-28: 462 mg via INTRAVENOUS
  Filled 2021-04-28: qty 22

## 2021-04-28 MED ORDER — SODIUM CHLORIDE 0.9 % IV SOLN
420.0000 mg | Freq: Once | INTRAVENOUS | Status: AC
  Administered 2021-04-28: 420 mg via INTRAVENOUS
  Filled 2021-04-28: qty 14

## 2021-04-28 NOTE — Progress Notes (Signed)
[facility] Follow up: ?  ? ?[facility], [facility] ?[address] ?[place] [address] ? ? ?DIAGNOSIS:  Cancer Staging  ?Malignant neoplasm of lower-outer quadrant of right breast of female, estrogen receptor positive ([place]) ?Staging form: Breast, AJCC 8th Edition ?- Clinical stage from 07/02/2020: Stage IV (cT2, cN1, cM1, G3, ER+, PR+, HER2+) - Signed by Chauncey Cruel, MD on 08/04/2020 ?Stage prefix: Initial diagnosis ?Histologic grading system: 3 grade system ?Laterality: Right ?Staged by: Pathologist and managing physician ?Stage used in treatment planning: Yes ?National guidelines used in treatment planning: Yes ?Type of national guideline used in treatment planning: NCCN ? ? ?SUMMARY OF ONCOLOGIC HISTORY: ?Oncology History  ?Malignant neoplasm of lower-outer quadrant of right breast of female, estrogen receptor positive ([place])  ?03/22/2016 Surgery  ? left lumpectomy 03/22/2016 showing atypical lobular hyperplasia ?prophylactic tamoxifen taken from 04/2016 through 03/2019 ?  ?06/06/2019 Genetic Testing  ? Negative genetic testing:  No pathogenic variants detected on the [facility] panel, ordered by Dr. Helane Rima at [facility]. Two variants of uncertain significance (VUS) were detected - one in the BRIP1 gene called c.2863A>C and a second in the NBN gene called c.643C>T. The report date is 06/06/2019. ? ?The [facility] gene panel offered by [employer] includes sequencing and deletion/duplication testing of the following 35 genes: APC, ATM, AXIN2, BARD1, BMPR1A, BRCA1, BRCA2, BRIP1, CDH1, CDK4, CDKN2A, CHEK2, EPCAM (large rearrangement only), HOXB13 (sequencing only), GALNT12, MLH1, MSH2, MSH3 (excluding repetitive portions of exon 1), MSH6, MUTYH, NBN, NTHL1, PALB2, PMS2, PTEN, RAD51C, RAD51D, RNF43, RPS20, SMAD4, STK11, and TP53. Sequencing was performed for select regions of POLE and POLD1, and large rearrangement analysis was performed for select  regions of GREM1.   ?  ?06/25/2020 Relapse/Recurrence  ? right breast lower outer quadrant biopsy 06/25/2020 shows a clinical T2 N1, stage IB invasive ductal carcinoma, grade 2 or 3, triple positive, with an MIB-1 of 40% ?  ?07/02/2020 Cancer Staging  ? Staging form: Breast, AJCC 8th Edition ?- Clinical stage from 07/02/2020: Stage IV (cT2, cN1, cM1, G3, ER+, PR+, HER2+) - Signed by Chauncey Cruel, MD on 08/04/2020 ?Stage prefix: Initial diagnosis ?Histologic grading system: 3 grade system ?Laterality: Right ?Staged by: Pathologist and managing physician ?Stage used in treatment planning: Yes ?National guidelines used in treatment planning: Yes ?Type of national guideline used in treatment planning: NCCN ? ?  ?07/16/2020 - 09/30/2020 Chemotherapy  ? neoadjuvant chemotherapy consisting of docetaxel, carboplatin, trastuzumab and pertuzumab every 21 days x 4 cycles started 07/28/2020, last chemotherapy 09/30/2020 ? ?  ? ?  ?08/01/2020 PET scan  ? PET scan 08/01/2020 shows no hypermetabolism in the 1.2 cm right liver lesion; bony hypermetabolism consistent with known mets ?  ?08/19/2020 - 10/21/2020 Chemotherapy  ? Patient is on Treatment Plan : BREAST  Trastuzumab + Pertuzumab q21d   ?   ?10/07/2020 Imaging  ?  total spinal MRI 10/07/2020 shows stable to improved disease, no extraosseous extension, pathologic fracture, epidural or intracanalicular involvement ?MRI of the breast 10/19/2020 shows a complete imaging response in the breast, with new ring-enhancing lesions in the left breast with MRI guided biopsy 11/10/2020 showing no evidence of malignancy ?  ?11/11/2020 -  Chemotherapy  ? Patient is on Treatment Plan : BREAST Trastuzumab + Pertuzumab q21d  ?   ?01/02/2021 Surgery  ? right lumpectomy with no sentinel lymph node sampling 01/02/2021 showed scattered foci of residual invasive ductal carcinoma, grade 2, the largest measuring 0.8 cm.  Margins were negative ?  ?01/02/2021 -  Anti-estrogen oral therapy  ? anastrozole  started 01/12/2021 ?  ?Bone metastases ([place])  ?08/04/2020 Initial Diagnosis  ? Bone metastases ([place]) ?  ?11/11/2020 -  Chemotherapy  ? Patient is on Treatment Plan : BREAST Trastuzumab + Pertuzumab q21d  ?   ? ? ?CURRENT THERAPY: Anastrozole daily; Herceptin/Perjeta every 3 weeks; Zometa every 3 months ? ?INTERVAL HISTORY: ?Kim Archer 56 y.o. female returns for follow up of her metastatic breast cancer.  She is doing well today.  She is taking anastrozole daily with good tolerance.  She continues on Hercpetin/Perjeta without any issues, and is also due for Zometa that she receives every 12 weeks without any issues.  Her calcium is 9.8 today.  She is doing well and has a normal activity level and works full time at an [employer].  She does have some joint aches worse in the morning, but otherwise is without questions or concerns.   ? ? ?Patient Active Problem List  ? Diagnosis Date Noted  ? 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 Aortic atherosclerosis ([place]) 04/28/2021  ? Bone metastases ([place]) 08/04/2020  ? Bone lesion 07/08/2020  ? Malignant neoplasm of lower-outer quadrant of right breast of female, estrogen receptor positive ([place]) 06/27/2020  ? Goals of care, counseling/discussion 06/06/2019  ? History of partial hysterectomy 03/28/2019  ? 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 ? ?is allergic to ciprofloxacin and sulfa antibiotics. ? ?MEDICAL HISTORY: ?Past Medical History:  ?Diagnosis Date  ? Allergy   ? Anemia   ? due to heavy periods  ? Atypical ductal hyperplasia of left breast   ? History of kidney stones   ? HSV-2 (herpes simplex virus 2) infection   ? PONV (postoperative nausea and vomiting)   ? right breast ca 06/2020  ? Breast cancer  ? ? ?SURGICAL HISTORY: ?Past Surgical History:  ?Procedure Laterality Date  ? BREAST CYST EXCISION Left 03/22/2016  ? ALH-BENIGN  ? BREAST LUMPECTOMY WITH RADIOACTIVE SEED LOCALIZATION Left 03/22/2016  ? Procedure: LEFT BREAST LUMPECTOMY WITH  RADIOACTIVE SEED LOCALIZATION;  Surgeon: Autumn Messing III, MD;  Location: [facility];  Service: General;  Laterality: Left;  ? BREAST LUMPECTOMY WITH RADIOACTIVE SEED LOCALIZATION Right 01/02/2021  ? Procedure: RIGHT BREAST LUMPECTOMY WITH RADIOACTIVE SEED LOCALIZATION X2;  Surgeon: Jovita Kussmaul, MD;  Location: [facility];  Service: General;  Laterality: Right;  ? CESAREAN SECTION    ? x3  ? COMBINED HYSTEROSCOPY DIAGNOSTIC / D&C  07/17/2018  ? EYE SURGERY    ? lt lens replacement  ? PORTACATH PLACEMENT Left 07/07/2020  ? Procedure: INSERTION PORT-A-CATH;  Surgeon: Jovita Kussmaul, MD;  Location: [facility];  Service: General;  Laterality: Left;  ? TUBAL LIGATION    ? ? ?SOCIAL HISTORY: ?Social History  ? ?Socioeconomic History  ? Marital status: Married  ?  Spouse name: Not on file  ? Number of children: Not on file  ? Years of education: Not on file  ? Highest education level: Not on file  ?Occupational History  ? Not on file  ?Tobacco Use  ? Smoking status: Never  ? Smokeless tobacco: Never  ?Vaping Use  ? Vaping Use: Never used  ?Substance and Sexual Activity  ? Alcohol use: No  ? Drug use: No  ? Sexual activity: Not on file  ?  Comment: tubal ligation  ?Other Topics Concern  ? Not on file  ?Social History Narrative  ?  Not on file  ? ?Social Determinants of Health  ? ?Financial Resource Strain: Low Risk   ? Difficulty of Paying Living Expenses: Not very hard  ?Food Insecurity: No Food Insecurity  ? Worried About Charity fundraiser in the Last Year: Never true  ? Ran Out of Food in the Last Year: Never true  ?Transportation Needs: No Transportation Needs  ? Lack of Transportation (Medical): No  ? Lack of Transportation (Non-Medical): No  ?Physical Activity: Not on file  ?Stress: Not on file  ?Social Connections: Not on file  ?Intimate Partner Violence: Not on file  ? ? ?FAMILY HISTORY: ?Family History  ?Problem Relation Age of Onset  ? Breast cancer Mother   ? Melanoma Father   ? Leukemia  Sister   ? Liver cancer Brother   ?     stomach  ? Breast cancer Maternal Aunt   ? Colon cancer Neg Hx   ? Colon polyps Neg Hx   ? Esophageal cancer Neg Hx   ? Rectal cancer Neg Hx   ? Stomach cancer Neg Hx   ? ? ?[place]

## 2021-04-28 NOTE — Progress Notes (Signed)
Per Mendel Ryder NP, ok to treat with Echo results from 01/21/21. ?

## 2021-04-28 NOTE — Assessment & Plan Note (Signed)
Metastatic Breast cancer ER/PR and Her 2 Positive: ?S/p NAC with TCHP and BCS ?Current Treatment: HP maintenance with Anastrozole and Zometa ?? ?10/08/20: Scattered osseous metastatic disease involving the thoracolumbar spine as detailed above, somewhat less pronounced as compared to previous abdominal MRI from 07/26/2020, and may be improved. Most prominent of these lesions measures 2 cm at the T10 vertebral body. ? ?03/14/2021: CT CAP: Previously demonstrated right axillary and subpectoral lymph nodes resolved.  Bone metastases have become sclerotic suggestive of response to treatment.  Indeterminate lesion right hepatic lobe: Not visualized ? ?Kim Archer continues on treatment with no sign of clinical progression of her metastatic breast cancer.  She will continue taking anastrozole daily, Herceptin Perjeta every 3 weeks, and is due for Zometa today. ? ?Kim Archer has some muscle cramps from time to time.  I recommended that she increase her water intake today.  We discussed that she will receive Zometa today.  I recommended that she take a little extra calcium today.  Her calcium level is 9.8. ? ?She is due for her echocardiogram and orders were placed for this.  Her CT scan did show some aortic atherosclerosis, and we will ask Dr. Algernon Huxley to address this when he follows up with her about her echo and Herceptin Perjeta. ? ?Kim Archer will return in 3 weeks for Herceptin Perjeta and in 6 weeks for labs, follow-up with Dr. Lindi Adie, Herceptin and Perjeta. ?

## 2021-04-28 NOTE — Progress Notes (Signed)
Pt monitored for 30 minutes post Pertuzumab. VSS. No complaints upon discharge.  ?

## 2021-04-28 NOTE — Patient Instructions (Signed)
[facility]  Discharge Instructions: Thank you for choosing [facility] to provide your oncology and hematology care.   If you have a lab appointment with the Cancer Center, please go directly to the Cancer Center and check in at the registration area.   Wear comfortable clothing and clothing appropriate for easy access to any Portacath or PICC line.   We strive to give you quality time with your provider. You may need to reschedule your appointment if you arrive late (15 or more minutes).  Arriving late affects you and other patients whose appointments are after yours.  Also, if you miss three or more appointments without notifying the office, you may be dismissed from the clinic at the provider's discretion.      For prescription refill requests, have your pharmacy contact our office and allow 72 hours for refills to be completed.    Today you received the following chemotherapy and/or immunotherapy agents: Trastuzumab, Pertuzumab.      To help prevent nausea and vomiting after your treatment, we encourage you to take your nausea medication as directed.  BELOW ARE SYMPTOMS THAT SHOULD BE REPORTED IMMEDIATELY: *FEVER GREATER THAN 100.4 F (38 C) OR HIGHER *CHILLS OR SWEATING *NAUSEA AND VOMITING THAT IS NOT CONTROLLED WITH YOUR NAUSEA MEDICATION *UNUSUAL SHORTNESS OF BREATH *UNUSUAL BRUISING OR BLEEDING *URINARY PROBLEMS (pain or burning when urinating, or frequent urination) *BOWEL PROBLEMS (unusual diarrhea, constipation, pain near the anus) TENDERNESS IN MOUTH AND THROAT WITH OR WITHOUT PRESENCE OF ULCERS (sore throat, sores in mouth, or a toothache) UNUSUAL RASH, SWELLING OR PAIN  UNUSUAL VAGINAL DISCHARGE OR ITCHING   Items with * indicate a potential emergency and should be followed up as soon as possible or go to the Emergency Department if any problems should occur.  Please show the CHEMOTHERAPY ALERT CARD or IMMUNOTHERAPY ALERT CARD  at check-in to the Emergency Department and triage nurse.  Should you have questions after your visit or need to cancel or reschedule your appointment, please contact [facility]  Dept: [phone]  and follow the prompts.  Office hours are 8:00 a.m. to 4:30 p.m. Monday - Friday. Please note that voicemails left after 4:00 p.m. may not be returned until the following business day.  We are closed weekends and major holidays. You have access to a nurse at all times for urgent questions. Please call the main number to the clinic Dept: [phone] and follow the prompts.   For any non-urgent questions, you may also contact your provider using MyChart. We now offer e-Visits for anyone 18 and older to request care online for non-urgent symptoms. For details visit [email or web address].   Also download the MyChart app! Go to the app store, search "MyChart", open the app, select [facility], and log in with your MyChart username and password.  Due to Covid, a mask is required upon entering the hospital/clinic. If you do not have a mask, one will be given to you upon arrival. For doctor visits, patients may have 1 support person aged 18 or older with them. For treatment visits, patients cannot have anyone with them due to current Covid guidelines and our immunocompromised population.  

## 2021-04-29 ENCOUNTER — Other Ambulatory Visit (HOSPITAL_COMMUNITY): Payer: Commercial Managed Care - PPO

## 2021-05-01 ENCOUNTER — Other Ambulatory Visit (HOSPITAL_COMMUNITY): Payer: Commercial Managed Care - PPO

## 2021-05-12 ENCOUNTER — Ambulatory Visit (HOSPITAL_COMMUNITY)
Admission: RE | Admit: 2021-05-12 | Discharge: 2021-05-12 | Disposition: A | Discharge status: Home / Self Care | Payer: Commercial Managed Care - PPO | Source: Ambulatory Visit | Attending: Adult Health | Admitting: Adult Health

## 2021-05-12 DIAGNOSIS — Z170 Estrogen receptor positive status [ER+]: Secondary | ICD-10-CM | POA: Insufficient documentation

## 2021-05-12 DIAGNOSIS — C50511 Malignant neoplasm of lower-outer quadrant of right female breast: Secondary | ICD-10-CM | POA: Insufficient documentation

## 2021-05-12 DIAGNOSIS — Z01818 Encounter for other preprocedural examination: Secondary | ICD-10-CM | POA: Insufficient documentation

## 2021-05-12 DIAGNOSIS — Z0189 Encounter for other specified special examinations: Secondary | ICD-10-CM | POA: Diagnosis not present

## 2021-05-12 LAB — ECHOCARDIOGRAM COMPLETE
AR max vel: 2.4 cm2
AV Peak grad: 8.9 mmHg
Ao pk vel: 1.49 m/s
Area-P 1/2: 4.12 cm2
Calc EF: 62.6 %
S' Lateral: 3 cm
Single Plane A2C EF: 64.8 %
Single Plane A4C EF: 63.1 %

## 2021-05-17 ENCOUNTER — Encounter: Payer: Self-pay | Admitting: Hematology and Oncology

## 2021-05-18 ENCOUNTER — Other Ambulatory Visit: Payer: Self-pay | Admitting: *Deleted

## 2021-05-18 ENCOUNTER — Inpatient Hospital Stay: Payer: Commercial Managed Care - PPO | Attending: Hematology and Oncology

## 2021-05-18 ENCOUNTER — Other Ambulatory Visit: Payer: Self-pay

## 2021-05-18 ENCOUNTER — Inpatient Hospital Stay: Payer: Commercial Managed Care - PPO

## 2021-05-18 VITALS — BP 128/60 | HR 61 | Temp 98.5°F | Resp 16 | Wt 178.8 lb

## 2021-05-18 DIAGNOSIS — Z79899 Other long term (current) drug therapy: Secondary | ICD-10-CM | POA: Diagnosis not present

## 2021-05-18 DIAGNOSIS — Z5112 Encounter for antineoplastic immunotherapy: Secondary | ICD-10-CM | POA: Diagnosis present

## 2021-05-18 DIAGNOSIS — C50511 Malignant neoplasm of lower-outer quadrant of right female breast: Secondary | ICD-10-CM | POA: Insufficient documentation

## 2021-05-18 DIAGNOSIS — Z95828 Presence of other vascular implants and grafts: Secondary | ICD-10-CM

## 2021-05-18 DIAGNOSIS — Z170 Estrogen receptor positive status [ER+]: Secondary | ICD-10-CM | POA: Diagnosis not present

## 2021-05-18 DIAGNOSIS — C7951 Secondary malignant neoplasm of bone: Secondary | ICD-10-CM | POA: Insufficient documentation

## 2021-05-18 LAB — CBC WITH DIFFERENTIAL/PLATELET
Abs Immature Granulocytes: 0.02 10*3/uL (ref 0.00–0.07)
Basophils Absolute: 0 10*3/uL (ref 0.0–0.1)
Basophils Relative: 1 %
Eosinophils Absolute: 0.3 10*3/uL (ref 0.0–0.5)
Eosinophils Relative: 5 %
HCT: 40 % (ref 36.0–46.0)
Hemoglobin: 13.1 g/dL (ref 12.0–15.0)
Immature Granulocytes: 0 %
Lymphocytes Relative: 35 %
Lymphs Abs: 1.9 10*3/uL (ref 0.7–4.0)
MCH: 30.8 pg (ref 26.0–34.0)
MCHC: 32.8 g/dL (ref 30.0–36.0)
MCV: 93.9 fL (ref 80.0–100.0)
Monocytes Absolute: 0.3 10*3/uL (ref 0.1–1.0)
Monocytes Relative: 5 %
Neutro Abs: 2.9 10*3/uL (ref 1.7–7.7)
Neutrophils Relative %: 54 %
Platelets: 214 10*3/uL (ref 150–400)
RBC: 4.26 MIL/uL (ref 3.87–5.11)
RDW: 12.1 % (ref 11.5–15.5)
WBC: 5.4 10*3/uL (ref 4.0–10.5)
nRBC: 0 % (ref 0.0–0.2)

## 2021-05-18 LAB — COMPREHENSIVE METABOLIC PANEL
ALT: 30 U/L (ref 0–44)
AST: 22 U/L (ref 15–41)
Albumin: 4.2 g/dL (ref 3.5–5.0)
Alkaline Phosphatase: 90 U/L (ref 38–126)
Anion gap: 6 (ref 5–15)
BUN: 19 mg/dL (ref 6–20)
CO2: 29 mmol/L (ref 22–32)
Calcium: 9.6 mg/dL (ref 8.9–10.3)
Chloride: 110 mmol/L (ref 98–111)
Creatinine, Ser: 0.77 mg/dL (ref 0.44–1.00)
GFR, Estimated: >60 mL/min (ref >60)
Glucose, Bld: 75 mg/dL (ref 70–99)
Potassium: 3.9 mmol/L (ref 3.5–5.1)
Sodium: 145 mmol/L (ref 135–145)
Total Bilirubin: 0.3 mg/dL (ref 0.3–1.2)
Total Protein: 7.1 g/dL (ref 6.5–8.1)

## 2021-05-18 MED ORDER — ACETAMINOPHEN 325 MG PO TABS
650.0000 mg | ORAL_TABLET | Freq: Once | ORAL | Status: AC
  Administered 2021-05-18: 650 mg via ORAL
  Filled 2021-05-18: qty 2

## 2021-05-18 MED ORDER — SODIUM CHLORIDE 0.9% FLUSH
10.0000 mL | INTRAVENOUS | Status: DC | PRN
  Administered 2021-05-18: 10 mL

## 2021-05-18 MED ORDER — SODIUM CHLORIDE 0.9 % IV SOLN
420.0000 mg | Freq: Once | INTRAVENOUS | Status: AC
  Administered 2021-05-18: 420 mg via INTRAVENOUS
  Filled 2021-05-18: qty 14

## 2021-05-18 MED ORDER — DIPHENHYDRAMINE HCL 25 MG PO CAPS
25.0000 mg | ORAL_CAPSULE | Freq: Once | ORAL | Status: AC
  Administered 2021-05-18: 25 mg via ORAL
  Filled 2021-05-18: qty 1

## 2021-05-18 MED ORDER — TRASTUZUMAB-ANNS CHEMO 150 MG IV SOLR
6.0000 mg/kg | Freq: Once | INTRAVENOUS | Status: AC
  Administered 2021-05-18: 462 mg via INTRAVENOUS
  Filled 2021-05-18: qty 22

## 2021-05-18 MED ORDER — SODIUM CHLORIDE 0.9 % IV SOLN: Freq: Once | INTRAVENOUS | Status: AC

## 2021-05-18 MED ORDER — HEPARIN SOD (PORK) LOCK FLUSH 100 UNIT/ML IV SOLN
500.0000 [IU] | Freq: Once | INTRAVENOUS | Status: AC | PRN
  Administered 2021-05-18: 500 [IU]

## 2021-05-18 MED ORDER — SODIUM CHLORIDE 0.9% FLUSH
10.0000 mL | INTRAVENOUS | Status: AC | PRN
  Administered 2021-05-18: 10 mL

## 2021-05-18 NOTE — Patient Instructions (Signed)
[facility]  Discharge Instructions: ?Thank you for choosing [facility] to provide your oncology and hematology care.  ? ?If you have a lab appointment with the [facility], please go directly to the [facility] and check in at the registration area. ?  ?Wear comfortable clothing and clothing appropriate for easy access to any Portacath or PICC line.  ? ?We strive to give you quality time with your provider. You may need to reschedule your appointment if you arrive late (15 or more minutes).  Arriving late affects you and other patients whose appointments are after yours.  Also, if you miss three or more appointments without notifying the office, you may be dismissed from the clinic at the provider?s discretion.    ?  ?For prescription refill requests, have your pharmacy contact our office and allow 72 hours for refills to be completed.   ? ?Today you received the following chemotherapy and/or immunotherapy agents: Trastuzumab and Pertuzumab    ?  ?To help prevent nausea and vomiting after your treatment, we encourage you to take your nausea medication as directed. ? ?BELOW ARE SYMPTOMS THAT SHOULD BE REPORTED IMMEDIATELY: ?*FEVER GREATER THAN 100.4 F (38 ?C) OR HIGHER ?*CHILLS OR SWEATING ?*NAUSEA AND VOMITING THAT IS NOT CONTROLLED WITH YOUR NAUSEA MEDICATION ?*UNUSUAL SHORTNESS OF BREATH ?*UNUSUAL BRUISING OR BLEEDING ?*URINARY PROBLEMS (pain or burning when urinating, or frequent urination) ?*BOWEL PROBLEMS (unusual diarrhea, constipation, pain near the anus) ?TENDERNESS IN MOUTH AND THROAT WITH OR WITHOUT PRESENCE OF ULCERS (sore throat, sores in mouth, or a toothache) ?UNUSUAL RASH, SWELLING OR PAIN  ?UNUSUAL VAGINAL DISCHARGE OR ITCHING  ? ?Items with * indicate a potential emergency and should be followed up as soon as possible or go to the Emergency Department if any problems should occur. ? ?Please show the CHEMOTHERAPY ALERT CARD or IMMUNOTHERAPY ALERT  CARD at check-in to the Emergency Department and triage nurse. ? ?Should you have questions after your visit or need to cancel or reschedule your appointment, please contact [facility]  Dept: [phone]  and follow the prompts.  Office hours are 8:00 a.m. to 4:30 p.m. Monday - Friday. Please note that voicemails left after 4:00 p.m. may not be returned until the following business day.  We are closed weekends and major holidays. You have access to a nurse at all times for urgent questions. Please call the main number to the clinic Dept: [phone] and follow the prompts. ? ? ?For any non-urgent questions, you may also contact your provider using MyChart. We now offer e-Visits for anyone 94 and older to request care online for non-urgent symptoms. For details visit [email or web address]. ?  ?Also download the MyChart app! Go to the app store, search "MyChart", open the app, select [facility], and log in with your MyChart username and password. ? ?Due to Covid, a mask is required upon entering the hospital/clinic. If you do not have a mask, one will be given to you upon arrival. For doctor visits, patients may have 1 support person aged 57 or older with them. For treatment visits, patients cannot have anyone with them due to current Covid guidelines and our immunocompromised population.  ? ?

## 2021-05-25 NOTE — Progress Notes (Signed)
? ?Patient Care Team: ?[facility], [facility] as PCP - General ?[name], MD as Consulting Physician (Obstetrics and Gynecology) ?Jovita Kussmaul, MD as Consulting Physician (General Surgery) ?Gery Pray, MD as Consulting Physician (Radiation Oncology) ?Larey Dresser, MD as Consulting Physician (Cardiology) ?Nicholas Lose, MD as Medical Oncologist (Hematology and Oncology) ? ?DIAGNOSIS:  ?Encounter Diagnosis  ?Name Primary?  ? Malignant neoplasm of lower-outer quadrant of right breast of female, estrogen receptor positive ([place]) Yes  ? ? ?SUMMARY OF ONCOLOGIC HISTORY: ?Oncology History  ?Malignant neoplasm of lower-outer quadrant of right breast of female, estrogen receptor positive ([place])  ?03/22/2016 Surgery  ? left lumpectomy 03/22/2016 showing atypical lobular hyperplasia ?prophylactic tamoxifen taken from 04/2016 through 03/2019 ?  ?06/06/2019 Genetic Testing  ? Negative genetic testing:  No pathogenic variants detected on the [facility] panel, ordered by Dr. Helane Rima at [facility]. Two variants of uncertain significance (VUS) were detected - one in the BRIP1 gene called c.2863A>C and a second in the NBN gene called c.643C>T. The report date is 06/06/2019. ? ?The [facility] gene panel offered by [employer] includes sequencing and deletion/duplication testing of the following 35 genes: APC, ATM, AXIN2, BARD1, BMPR1A, BRCA1, BRCA2, BRIP1, CDH1, CDK4, CDKN2A, CHEK2, EPCAM (large rearrangement only), HOXB13 (sequencing only), GALNT12, MLH1, MSH2, MSH3 (excluding repetitive portions of exon 1), MSH6, MUTYH, NBN, NTHL1, PALB2, PMS2, PTEN, RAD51C, RAD51D, RNF43, RPS20, SMAD4, STK11, and TP53. Sequencing was performed for select regions of POLE and POLD1, and large rearrangement analysis was performed for select regions of GREM1.   ?  ?06/25/2020 Relapse/Recurrence  ? right breast lower outer quadrant biopsy 06/25/2020 shows a clinical T2 N1, stage IB invasive ductal  carcinoma, grade 2 or 3, triple positive, with an MIB-1 of 40% ?  ?07/02/2020 Cancer Staging  ? Staging form: Breast, AJCC 8th Edition ?- Clinical stage from 07/02/2020: Stage IV (cT2, cN1, cM1, G3, ER+, PR+, HER2+) - Signed by Chauncey Cruel, MD on 08/04/2020 ?Stage prefix: Initial diagnosis ?Histologic grading system: 3 grade system ?Laterality: Right ?Staged by: Pathologist and managing physician ?Stage used in treatment planning: Yes ?National guidelines used in treatment planning: Yes ?Type of national guideline used in treatment planning: NCCN ? ?  ?07/16/2020 - 09/30/2020 Chemotherapy  ? neoadjuvant chemotherapy consisting of docetaxel, carboplatin, trastuzumab and pertuzumab every 21 days x 4 cycles started 07/28/2020, last chemotherapy 09/30/2020 ? ?  ? ?  ?08/01/2020 PET scan  ? PET scan 08/01/2020 shows no hypermetabolism in the 1.2 cm right liver lesion; bony hypermetabolism consistent with known mets ?  ?08/19/2020 - 10/21/2020 Chemotherapy  ? Patient is on Treatment Plan : BREAST  Trastuzumab + Pertuzumab q21d   ? ?  ?  ?10/07/2020 Imaging  ?  total spinal MRI 10/07/2020 shows stable to improved disease, no extraosseous extension, pathologic fracture, epidural or intracanalicular involvement ?MRI of the breast 10/19/2020 shows a complete imaging response in the breast, with new ring-enhancing lesions in the left breast with MRI guided biopsy 11/10/2020 showing no evidence of malignancy ?  ?11/11/2020 -  Chemotherapy  ? Patient is on Treatment Plan : BREAST Trastuzumab + Pertuzumab q21d  ? ?  ?  ?01/02/2021 Surgery  ? right lumpectomy with no sentinel lymph node sampling 01/02/2021 showed scattered foci of residual invasive ductal carcinoma, grade 2, the largest measuring 0.8 cm.  Margins were negative ?  ?01/02/2021 -  Anti-estrogen oral therapy  ? anastrozole started 01/12/2021 ?  ?Malignant neoplasm metastatic to bone [facility])  ?08/04/2020  Initial Diagnosis  ? Bone metastases ([place]) ? ?  ?11/11/2020 -  Chemotherapy   ? Patient is on Treatment Plan : BREAST Trastuzumab + Pertuzumab q21d  ? ?  ?  ? ? ?CHIEF COMPLIANT: Herceptin Perjeta, anastrozole with Xgeva ? ?INTERVAL HISTORY: Kim Archer is a  56 y.o. with above-mentioned history of metastatic breast cancer is currently on Herceptin and Perjeta maintenance. She presents to the clinic today for treatment and follow-up. She has manageable diarrhea. She states that she does has hot flashes a couple times of day and night. She concerned about the joint pain in her knuckles and her knees. She states when she gets up that's when it bothers her the most. ?She is also feeling symptoms of depression with tearfulness.  She gets diarrhea from urgent.  Intermittent tenderness in the right breast ? ?ALLERGIES:  is allergic to ciprofloxacin and sulfa antibiotics. ? ?MEDICATIONS:  ?Current Outpatient Medications  ?Medication Sig Dispense Refill  ? Biotin 5 MG CAPS Take 1 capsule (5 mg total) by mouth daily.  0  ? letrozole (FEMARA) 2.5 MG tablet Take 1 tablet (2.5 mg total) by mouth daily. 90 tablet 3  ? venlafaxine XR (EFFEXOR-XR) 37.5 MG 24 hr capsule Take 1 capsule (37.5 mg total) by mouth daily with breakfast. 90 capsule 3  ? Boron 3 MG CAPS Take by mouth.    ? Calcium 500 MG tablet Take 600 mg by mouth.    ? chlorpheniramine-HYDROcodone 10-8 MG/5ML hydrocodone 10 mg-chlorpheniramine 8 mg/5 mL oral susp extend.rel 12hr ? TAKE 5 ML BY MOUTH EVERY 12 HOURS AS NEEDED    ? ibuprofen (ADVIL) 800 MG tablet ibuprofen 800 mg tablet ? TAKE 1 TABLET BY MOUTH THREE TIMES A DAY    ? ipratropium (ATROVENT) 0.06 % nasal spray Place into both nostrils.    ? levOCARNitine (CARNITINE PO) Take 1 capsule by mouth daily. L-Carnitine    ? lidocaine-prilocaine (EMLA) cream Apply topically daily.    ? loratadine (CLARITIN) 10 MG tablet Take 10 mg by mouth daily.    ? LORazepam (ATIVAN) 0.5 MG tablet lorazepam 0.5 mg tablet ? TAKE 1 TABLET BY MOUTH EVERY 8 HOURS AS NEEDED MAY INCREASE TO 2 TABLETS AS  NEEDED    ? LUTEIN PO Take by mouth.    ? MAGNESIUM PO Take 400 mg by mouth at bedtime.    ? Menatetrenone (VITAMIN K2) 100 MCG TABS Take by mouth.    ? Multiple Vitamin (MULTIVITAMIN WITH MINERALS) TABS tablet Take 1 tablet by mouth daily.    ? OVER THE COUNTER MEDICATION Take 1 capsule by mouth daily. [place] Mushroom 1235m    ? tobramycin-dexamethasone (TOBRADEX) ophthalmic solution every 4 (four) hours while awake.    ? Ubiquinol 50 MG CAPS Take by mouth daily.    ? VITAMIN D PO 50 mcg.    ? zinc sulfate 220 (50 Zn) MG capsule Take 220 mg by mouth daily.    ? ?No current facility-administered medications for this visit.  ? ? ?PHYSICAL EXAMINATION: ?ECOG PERFORMANCE STATUS: 1 - Symptomatic but completely ambulatory ? ?Vitals:  ? 06/08/21 0809  ?BP: (!) 114/51  ?Pulse: 62  ?Resp: 18  ?Temp: (!) 97.3 ?F (36.3 ?C)  ?SpO2: 99%  ? ?Filed Weights  ? 06/08/21 0809  ?Weight: 176 lb 4.8 oz (80 kg)  ? ?  ? ?LABORATORY DATA:  ?I have reviewed the data as listed ? ?  Latest Ref Rng & Units 06/08/2021  ?  7:55  AM 05/18/2021  ?  8:38 AM 04/28/2021  ?  8:51 AM  ?CMP  ?Glucose 70 - 99 mg/dL 120   75   131    ?BUN 6 - 20 mg/dL '19   19   15    ' ?Creatinine 0.44 - 1.00 mg/dL 0.93   0.77   0.83    ?Sodium 135 - 145 mmol/L 142   145   143    ?Potassium 3.5 - 5.1 mmol/L 3.6   3.9   3.6    ?Chloride 98 - 111 mmol/L 107   110   107    ?CO2 22 - 32 mmol/L '28   29   27    ' ?Calcium 8.9 - 10.3 mg/dL 9.7   9.6   9.8    ?Total Protein 6.5 - 8.1 g/dL 7.0   7.1   6.8    ?Total Bilirubin 0.3 - 1.2 mg/dL 0.3   0.3   0.4    ?Alkaline Phos 38 - 126 U/L 88   90   90    ?AST 15 - 41 U/L '20   22   20    ' ?ALT 0 - 44 U/L 29   30   32    ? ? ?Lab Results  ?Component Value Date  ? WBC 5.2 06/08/2021  ? HGB 13.6 06/08/2021  ? HCT 41.1 06/08/2021  ? MCV 92.8 06/08/2021  ? PLT 207 06/08/2021  ? NEUTROABS 2.6 06/08/2021  ? ? ?ASSESSMENT & PLAN:  ?Malignant neoplasm of lower-outer quadrant of right breast of female, estrogen receptor positive ([place]) ?Metastatic  Breast cancer ER/PR and Her 2 Positive: ?S/p NAC with TCHP and BCS ?Current Treatment: HP maintenance with Anastrozole and Xgeva switched to letrozole 06/08/2021 ? ?Toxicities:  ?1.  Joint pains and stiffness:

## 2021-06-05 ENCOUNTER — Encounter: Payer: Self-pay | Admitting: Hematology and Oncology

## 2021-06-05 DIAGNOSIS — Z95828 Presence of other vascular implants and grafts: Secondary | ICD-10-CM | POA: Insufficient documentation

## 2021-06-08 ENCOUNTER — Inpatient Hospital Stay (HOSPITAL_BASED_OUTPATIENT_CLINIC_OR_DEPARTMENT_OTHER): Payer: Commercial Managed Care - PPO | Admitting: Hematology and Oncology

## 2021-06-08 ENCOUNTER — Inpatient Hospital Stay: Payer: Commercial Managed Care - PPO

## 2021-06-08 ENCOUNTER — Other Ambulatory Visit: Payer: Self-pay

## 2021-06-08 VITALS — BP 114/51 | HR 62 | Temp 97.3°F | Resp 18 | Ht 67.0 in | Wt 176.3 lb

## 2021-06-08 DIAGNOSIS — M899 Disorder of bone, unspecified: Secondary | ICD-10-CM

## 2021-06-08 DIAGNOSIS — C50511 Malignant neoplasm of lower-outer quadrant of right female breast: Secondary | ICD-10-CM | POA: Diagnosis not present

## 2021-06-08 DIAGNOSIS — Z170 Estrogen receptor positive status [ER+]: Secondary | ICD-10-CM | POA: Diagnosis not present

## 2021-06-08 DIAGNOSIS — C7951 Secondary malignant neoplasm of bone: Secondary | ICD-10-CM

## 2021-06-08 DIAGNOSIS — Z95828 Presence of other vascular implants and grafts: Secondary | ICD-10-CM

## 2021-06-08 DIAGNOSIS — Z5112 Encounter for antineoplastic immunotherapy: Secondary | ICD-10-CM | POA: Diagnosis not present

## 2021-06-08 DIAGNOSIS — Z7189 Other specified counseling: Secondary | ICD-10-CM

## 2021-06-08 LAB — CBC WITH DIFFERENTIAL (CANCER CENTER ONLY)
Abs Immature Granulocytes: 0.01 10*3/uL (ref 0.00–0.07)
Basophils Absolute: 0 10*3/uL (ref 0.0–0.1)
Basophils Relative: 0 %
Eosinophils Absolute: 0.2 10*3/uL (ref 0.0–0.5)
Eosinophils Relative: 4 %
HCT: 41.1 % (ref 36.0–46.0)
Hemoglobin: 13.6 g/dL (ref 12.0–15.0)
Immature Granulocytes: 0 %
Lymphocytes Relative: 40 %
Lymphs Abs: 2.1 10*3/uL (ref 0.7–4.0)
MCH: 30.7 pg (ref 26.0–34.0)
MCHC: 33.1 g/dL (ref 30.0–36.0)
MCV: 92.8 fL (ref 80.0–100.0)
Monocytes Absolute: 0.3 10*3/uL (ref 0.1–1.0)
Monocytes Relative: 6 %
Neutro Abs: 2.6 10*3/uL (ref 1.7–7.7)
Neutrophils Relative %: 50 %
Platelet Count: 207 10*3/uL (ref 150–400)
RBC: 4.43 MIL/uL (ref 3.87–5.11)
RDW: 11.8 % (ref 11.5–15.5)
WBC Count: 5.2 10*3/uL (ref 4.0–10.5)
nRBC: 0 % (ref 0.0–0.2)

## 2021-06-08 LAB — CMP (CANCER CENTER ONLY)
ALT: 29 U/L (ref 0–44)
AST: 20 U/L (ref 15–41)
Albumin: 4.2 g/dL (ref 3.5–5.0)
Alkaline Phosphatase: 88 U/L (ref 38–126)
Anion gap: 7 (ref 5–15)
BUN: 19 mg/dL (ref 6–20)
CO2: 28 mmol/L (ref 22–32)
Calcium: 9.7 mg/dL (ref 8.9–10.3)
Chloride: 107 mmol/L (ref 98–111)
Creatinine: 0.93 mg/dL (ref 0.44–1.00)
GFR, Estimated: >60 mL/min (ref >60)
Glucose, Bld: 120 mg/dL — ABNORMAL HIGH (ref 70–99)
Potassium: 3.6 mmol/L (ref 3.5–5.1)
Sodium: 142 mmol/L (ref 135–145)
Total Bilirubin: 0.3 mg/dL (ref 0.3–1.2)
Total Protein: 7 g/dL (ref 6.5–8.1)

## 2021-06-08 MED ORDER — TRASTUZUMAB-ANNS CHEMO 150 MG IV SOLR
6.0000 mg/kg | Freq: Once | INTRAVENOUS | Status: AC
  Administered 2021-06-08: 462 mg via INTRAVENOUS
  Filled 2021-06-08: qty 22

## 2021-06-08 MED ORDER — ACETAMINOPHEN 325 MG PO TABS
650.0000 mg | ORAL_TABLET | Freq: Once | ORAL | Status: AC
  Administered 2021-06-08: 650 mg via ORAL
  Filled 2021-06-08: qty 2

## 2021-06-08 MED ORDER — HEPARIN SOD (PORK) LOCK FLUSH 100 UNIT/ML IV SOLN
500.0000 [IU] | Freq: Once | INTRAVENOUS | Status: AC | PRN
  Administered 2021-06-08: 500 [IU]

## 2021-06-08 MED ORDER — SODIUM CHLORIDE 0.9% FLUSH
10.0000 mL | INTRAVENOUS | Status: DC | PRN
  Administered 2021-06-08: 10 mL

## 2021-06-08 MED ORDER — DIPHENHYDRAMINE HCL 25 MG PO CAPS
25.0000 mg | ORAL_CAPSULE | Freq: Once | ORAL | Status: AC
  Administered 2021-06-08: 25 mg via ORAL
  Filled 2021-06-08: qty 1

## 2021-06-08 MED ORDER — SODIUM CHLORIDE 0.9 % IV SOLN: Freq: Once | INTRAVENOUS | Status: AC

## 2021-06-08 MED ORDER — BIOTIN 5 MG PO CAPS: 1.0000 | ORAL_CAPSULE | Freq: Every day | ORAL | 0 refills | Status: AC

## 2021-06-08 MED ORDER — SODIUM CHLORIDE 0.9 % IV SOLN
420.0000 mg | Freq: Once | INTRAVENOUS | Status: AC
  Administered 2021-06-08: 420 mg via INTRAVENOUS
  Filled 2021-06-08: qty 14

## 2021-06-08 MED ORDER — VENLAFAXINE HCL ER 37.5 MG PO CP24: 37.5000 mg | ORAL_CAPSULE | Freq: Every day | ORAL | 3 refills | Status: DC

## 2021-06-08 MED ORDER — LETROZOLE 2.5 MG PO TABS: 2.5000 mg | ORAL_TABLET | Freq: Every day | ORAL | 3 refills | Status: DC

## 2021-06-08 MED ORDER — SODIUM CHLORIDE 0.9% FLUSH
10.0000 mL | Freq: Once | INTRAVENOUS | Status: AC
  Administered 2021-06-08: 10 mL

## 2021-06-08 NOTE — Assessment & Plan Note (Addendum)
Metastatic Breast cancer ER/PR and Her 2 Positive: ?S/p NAC with TCHP and BCS ?Current Treatment: HP maintenance with Anastrozole and Xgeva switched to letrozole 06/08/2021 ? ?Toxicities:  ?1.  Joint pains and stiffness: Recommended switching from anastrozole to letrozole ?2. hot flashes ?3.  Depression/tearfulness: Sent a prescription for Effexor.  Encouraged her to see a therapist or counselor. ?4.  Diarrhea from Perjeta: Manageable ? ?03/14/2021: CT CAP: Previously demonstrated right axillary and subpectoral lymph nodes resolved.  Bone metastases have become sclerotic suggestive of response to treatment.  Indeterminate lesion right hepatic lobe: Not visualized ?? ?Patient has a wedding in June and she would like to do the scans in July ?Continue with Herceptin Perjeta maintenance along with anastrozole and Xgeva ?Zometa will be every 3 months. ?

## 2021-06-08 NOTE — Patient Instructions (Signed)
[facility]  Discharge Instructions: ?Thank you for choosing [facility] to provide your oncology and hematology care.  ? ?If you have a lab appointment with the [facility], please go directly to the [facility] and check in at the registration area. ?  ?Wear comfortable clothing and clothing appropriate for easy access to any Portacath or PICC line.  ? ?We strive to give you quality time with your provider. You may need to reschedule your appointment if you arrive late (15 or more minutes).  Arriving late affects you and other patients whose appointments are after yours.  Also, if you miss three or more appointments without notifying the office, you may be dismissed from the clinic at the provider?s discretion.    ?  ?For prescription refill requests, have your pharmacy contact our office and allow 72 hours for refills to be completed.   ? ?Today you received the following chemotherapy and/or immunotherapy agents: Perjeta, Kanjinti    ?  ?To help prevent nausea and vomiting after your treatment, we encourage you to take your nausea medication as directed. ? ?BELOW ARE SYMPTOMS THAT SHOULD BE REPORTED IMMEDIATELY: ?*FEVER GREATER THAN 100.4 F (38 ?C) OR HIGHER ?*CHILLS OR SWEATING ?*NAUSEA AND VOMITING THAT IS NOT CONTROLLED WITH YOUR NAUSEA MEDICATION ?*UNUSUAL SHORTNESS OF BREATH ?*UNUSUAL BRUISING OR BLEEDING ?*URINARY PROBLEMS (pain or burning when urinating, or frequent urination) ?*BOWEL PROBLEMS (unusual diarrhea, constipation, pain near the anus) ?TENDERNESS IN MOUTH AND THROAT WITH OR WITHOUT PRESENCE OF ULCERS (sore throat, sores in mouth, or a toothache) ?UNUSUAL RASH, SWELLING OR PAIN  ?UNUSUAL VAGINAL DISCHARGE OR ITCHING  ? ?Items with * indicate a potential emergency and should be followed up as soon as possible or go to the Emergency Department if any problems should occur. ? ?Please show the CHEMOTHERAPY ALERT CARD or IMMUNOTHERAPY ALERT CARD at  check-in to the Emergency Department and triage nurse. ? ?Should you have questions after your visit or need to cancel or reschedule your appointment, please contact [facility]  Dept: [phone]  and follow the prompts.  Office hours are 8:00 a.m. to 4:30 p.m. Monday - Friday. Please note that voicemails left after 4:00 p.m. may not be returned until the following business day.  We are closed weekends and major holidays. You have access to a nurse at all times for urgent questions. Please call the main number to the clinic Dept: [phone] and follow the prompts. ? ? ?For any non-urgent questions, you may also contact your provider using MyChart. We now offer e-Visits for anyone 12 and older to request care online for non-urgent symptoms. For details visit [email or web address]. ?  ?Also download the MyChart app! Go to the app store, search "MyChart", open the app, select [facility], and log in with your MyChart username and password. ? ?Due to Covid, a mask is required upon entering the hospital/clinic. If you do not have a mask, one will be given to you upon arrival. For doctor visits, patients may have 1 support person aged 24 or older with them. For treatment visits, patients cannot have anyone with them due to current Covid guidelines and our immunocompromised population.  ? ?

## 2021-06-12 ENCOUNTER — Ambulatory Visit
Admission: RE | Admit: 2021-06-12 | Discharge: 2021-06-12 | Disposition: A | Discharge status: Home / Self Care | Payer: Commercial Managed Care - PPO | Source: Ambulatory Visit | Attending: Adult Health | Admitting: Adult Health

## 2021-06-12 DIAGNOSIS — C50511 Malignant neoplasm of lower-outer quadrant of right female breast: Secondary | ICD-10-CM

## 2021-06-12 IMAGING — MG DIGITAL DIAGNOSTIC BILAT W/ TOMO W/ CAD
6 of 11 series · 6 of 31 positions shown · non-contrast
Comparison: Previous exam(s).

CLINICAL DATA: Right lumpectomy in [DS]

EXAM:
DIGITAL DIAGNOSTIC BILATERAL MAMMOGRAM WITH TOMOSYNTHESIS AND CAD
TECHNIQUE: Bilateral digital diagnostic mammography and breast tomosynthesis
was performed. The images were evaluated with computer-aided
detection.

[R MLO]
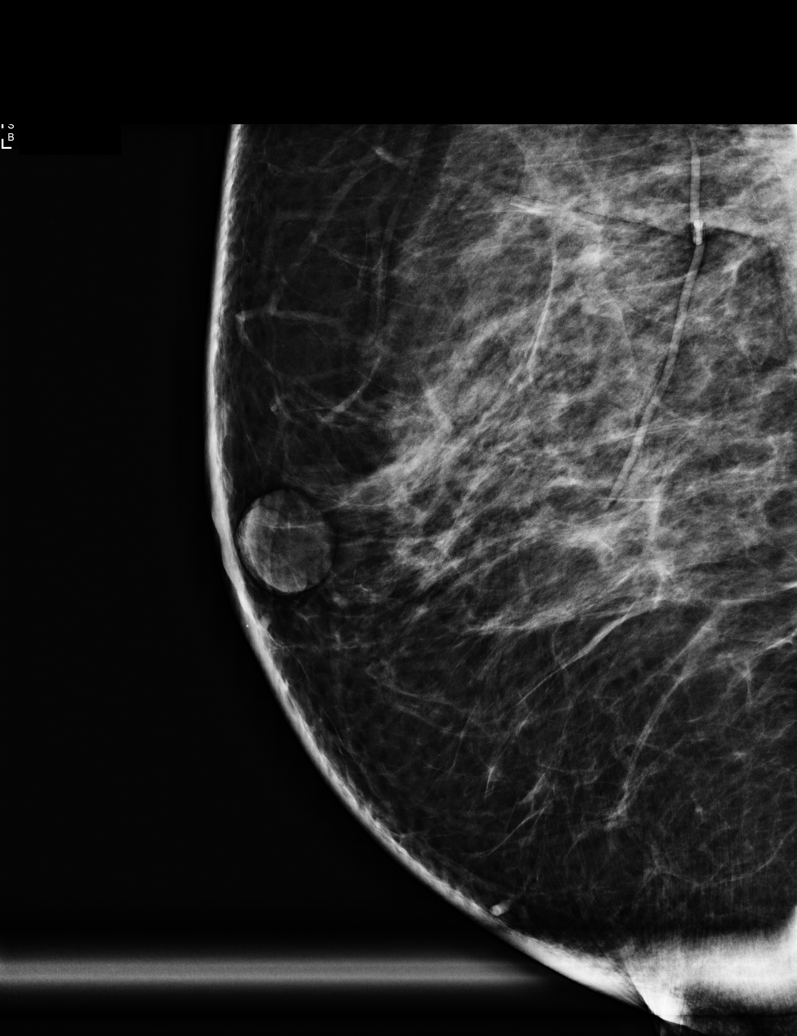

[R MLO synth-2D]
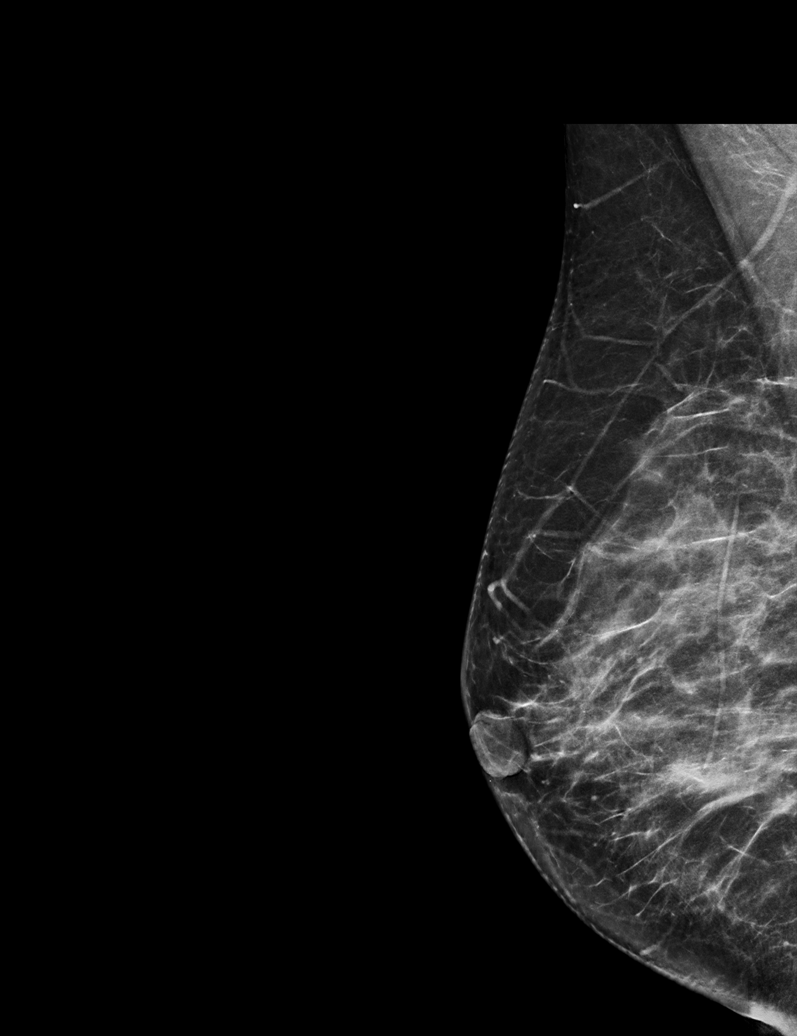

[R CC synth-2D (1 of 2)]
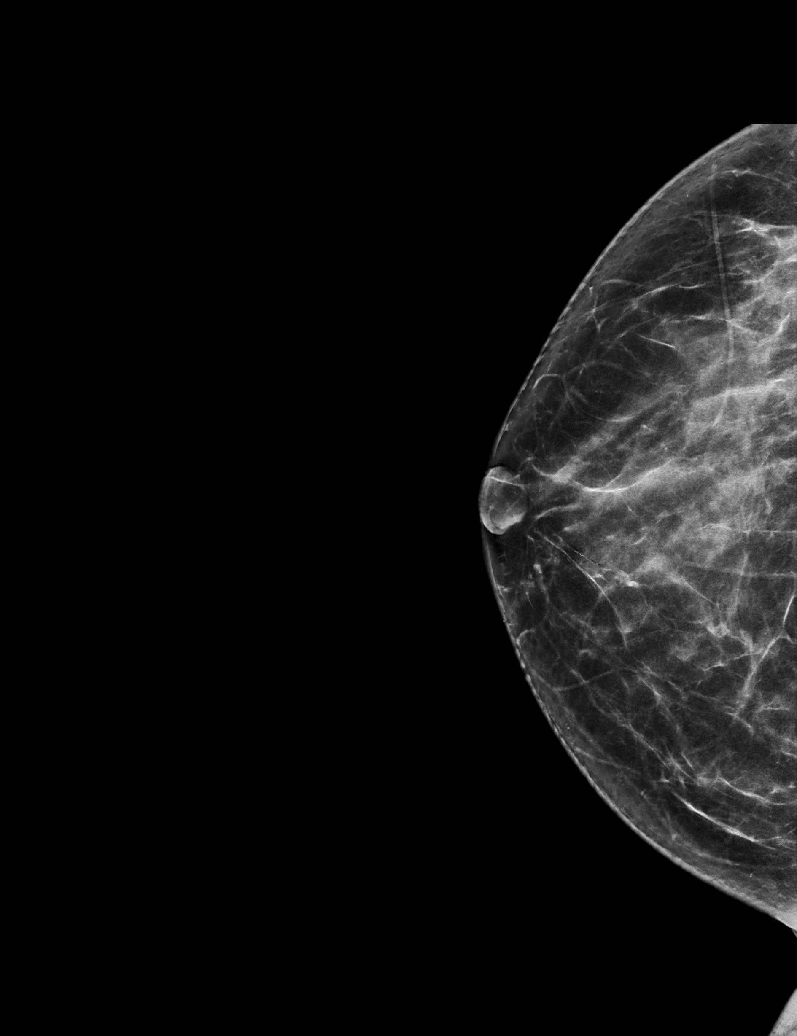

[L MLO synth-2D]
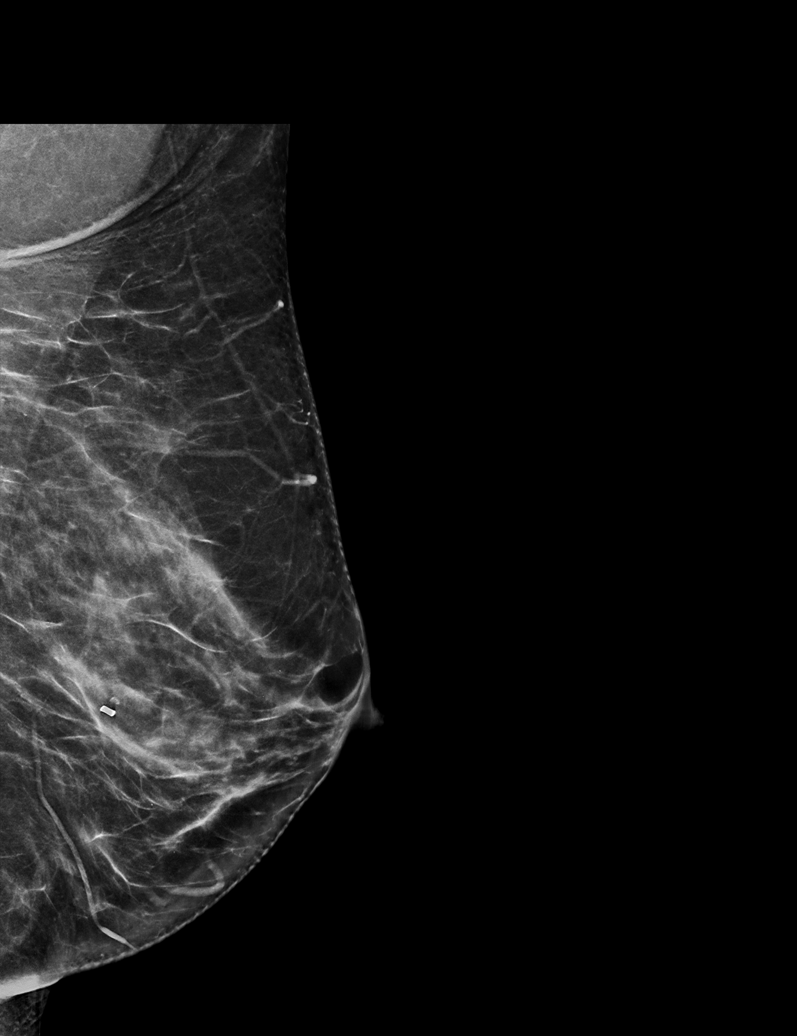

[L CC synth-2D]
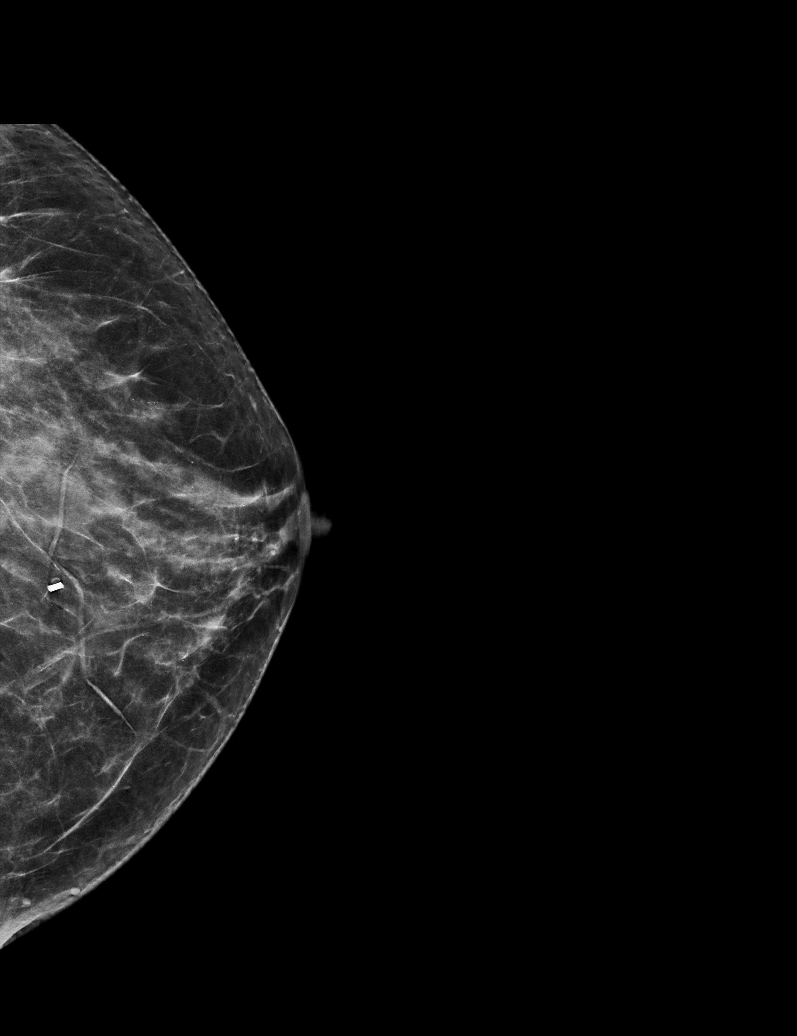

[R CC synth-2D (2 of 2)]
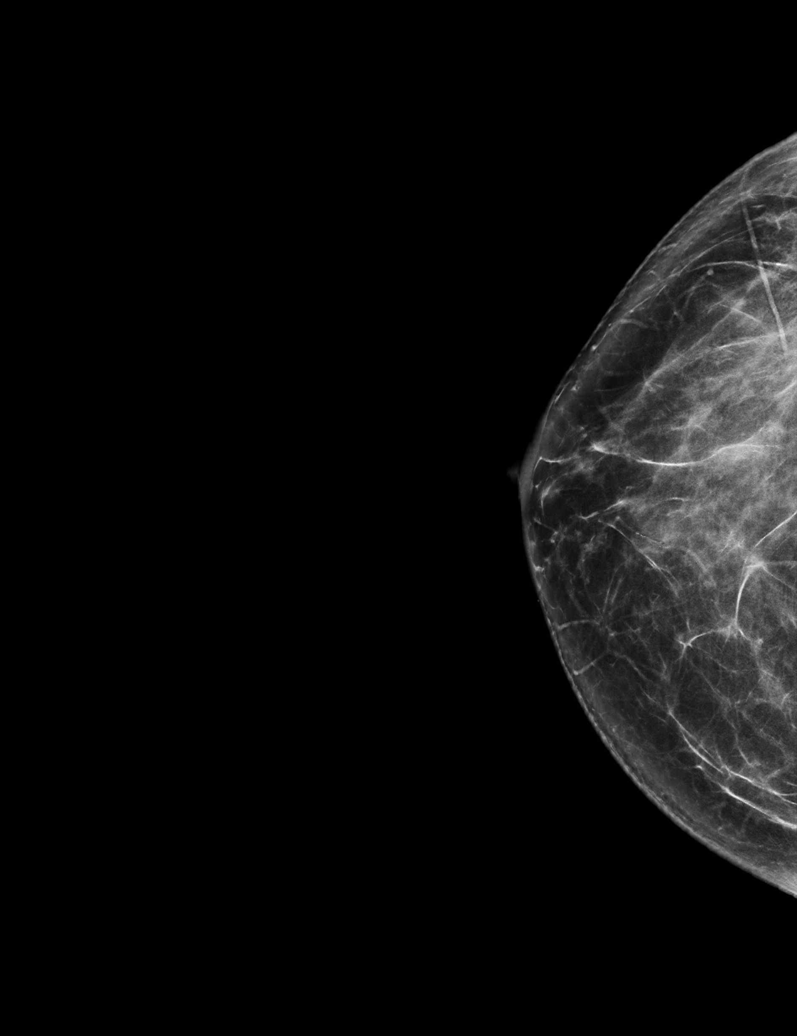

[6 of 31 positions shown; findings below may reference images not displayed]

ACR Breast Density Category c: The breast tissue is heterogeneously
dense, which may obscure small masses.
FINDINGS: The right lumpectomy site appears as expected. No suspicious masses,
calcifications, or distortion are identified in either breast.
IMPRESSION: No evidence recurrence.  No evidence of malignancy in either breast.

RECOMMENDATION:
Annual diagnostic mammography.

I have discussed the findings and recommendations with the patient.
If applicable, a reminder letter will be sent to the patient
regarding the next appointment.

BI-RADS CATEGORY  2: Benign.

## 2021-06-29 ENCOUNTER — Inpatient Hospital Stay: Payer: Commercial Managed Care - PPO | Attending: Hematology and Oncology

## 2021-06-29 ENCOUNTER — Inpatient Hospital Stay: Payer: Commercial Managed Care - PPO

## 2021-06-29 ENCOUNTER — Other Ambulatory Visit: Payer: Self-pay

## 2021-06-29 VITALS — BP 112/69 | HR 65 | Temp 98.3°F | Resp 16 | Wt 178.5 lb

## 2021-06-29 DIAGNOSIS — C50511 Malignant neoplasm of lower-outer quadrant of right female breast: Secondary | ICD-10-CM

## 2021-06-29 DIAGNOSIS — Z170 Estrogen receptor positive status [ER+]: Secondary | ICD-10-CM | POA: Diagnosis not present

## 2021-06-29 DIAGNOSIS — C7951 Secondary malignant neoplasm of bone: Secondary | ICD-10-CM

## 2021-06-29 DIAGNOSIS — Z79899 Other long term (current) drug therapy: Secondary | ICD-10-CM | POA: Diagnosis not present

## 2021-06-29 DIAGNOSIS — Z95828 Presence of other vascular implants and grafts: Secondary | ICD-10-CM

## 2021-06-29 DIAGNOSIS — Z7189 Other specified counseling: Secondary | ICD-10-CM

## 2021-06-29 DIAGNOSIS — Z5112 Encounter for antineoplastic immunotherapy: Secondary | ICD-10-CM | POA: Diagnosis present

## 2021-06-29 DIAGNOSIS — M899 Disorder of bone, unspecified: Secondary | ICD-10-CM

## 2021-06-29 LAB — CBC WITH DIFFERENTIAL (CANCER CENTER ONLY)
Abs Immature Granulocytes: 0.01 10*3/uL (ref 0.00–0.07)
Basophils Absolute: 0 10*3/uL (ref 0.0–0.1)
Basophils Relative: 0 %
Eosinophils Absolute: 0.2 10*3/uL (ref 0.0–0.5)
Eosinophils Relative: 3 %
HCT: 39.1 % (ref 36.0–46.0)
Hemoglobin: 13.3 g/dL (ref 12.0–15.0)
Immature Granulocytes: 0 %
Lymphocytes Relative: 39 %
Lymphs Abs: 2 10*3/uL (ref 0.7–4.0)
MCH: 31.1 pg (ref 26.0–34.0)
MCHC: 34 g/dL (ref 30.0–36.0)
MCV: 91.6 fL (ref 80.0–100.0)
Monocytes Absolute: 0.3 10*3/uL (ref 0.1–1.0)
Monocytes Relative: 5 %
Neutro Abs: 2.7 10*3/uL (ref 1.7–7.7)
Neutrophils Relative %: 53 %
Platelet Count: 200 10*3/uL (ref 150–400)
RBC: 4.27 MIL/uL (ref 3.87–5.11)
RDW: 12 % (ref 11.5–15.5)
WBC Count: 5.2 10*3/uL (ref 4.0–10.5)
nRBC: 0 % (ref 0.0–0.2)

## 2021-06-29 LAB — CMP (CANCER CENTER ONLY)
ALT: 25 U/L (ref 0–44)
AST: 21 U/L (ref 15–41)
Albumin: 4.2 g/dL (ref 3.5–5.0)
Alkaline Phosphatase: 75 U/L (ref 38–126)
Anion gap: 7 (ref 5–15)
BUN: 22 mg/dL — ABNORMAL HIGH (ref 6–20)
CO2: 28 mmol/L (ref 22–32)
Calcium: 9.7 mg/dL (ref 8.9–10.3)
Chloride: 107 mmol/L (ref 98–111)
Creatinine: 0.94 mg/dL (ref 0.44–1.00)
GFR, Estimated: >60 mL/min (ref >60)
Glucose, Bld: 116 mg/dL — ABNORMAL HIGH (ref 70–99)
Potassium: 3.6 mmol/L (ref 3.5–5.1)
Sodium: 142 mmol/L (ref 135–145)
Total Bilirubin: 0.4 mg/dL (ref 0.3–1.2)
Total Protein: 6.8 g/dL (ref 6.5–8.1)

## 2021-06-29 MED ORDER — HEPARIN SOD (PORK) LOCK FLUSH 100 UNIT/ML IV SOLN
500.0000 [IU] | Freq: Once | INTRAVENOUS | Status: AC | PRN
  Administered 2021-06-29: 500 [IU]

## 2021-06-29 MED ORDER — SODIUM CHLORIDE 0.9 % IV SOLN: Freq: Once | INTRAVENOUS | Status: AC

## 2021-06-29 MED ORDER — SODIUM CHLORIDE 0.9% FLUSH
10.0000 mL | INTRAVENOUS | Status: DC | PRN
  Administered 2021-06-29: 10 mL

## 2021-06-29 MED ORDER — SODIUM CHLORIDE 0.9 % IV SOLN
420.0000 mg | Freq: Once | INTRAVENOUS | Status: AC
  Administered 2021-06-29: 420 mg via INTRAVENOUS
  Filled 2021-06-29: qty 14

## 2021-06-29 MED ORDER — TRASTUZUMAB-ANNS CHEMO 150 MG IV SOLR
6.0000 mg/kg | Freq: Once | INTRAVENOUS | Status: AC
  Administered 2021-06-29: 462 mg via INTRAVENOUS
  Filled 2021-06-29: qty 22

## 2021-06-29 MED ORDER — DIPHENHYDRAMINE HCL 25 MG PO CAPS
25.0000 mg | ORAL_CAPSULE | Freq: Once | ORAL | Status: AC
  Administered 2021-06-29: 25 mg via ORAL
  Filled 2021-06-29: qty 1

## 2021-06-29 MED ORDER — SODIUM CHLORIDE 0.9% FLUSH
10.0000 mL | Freq: Once | INTRAVENOUS | Status: AC
  Administered 2021-06-29: 10 mL

## 2021-06-29 MED ORDER — ACETAMINOPHEN 325 MG PO TABS
650.0000 mg | ORAL_TABLET | Freq: Once | ORAL | Status: AC
  Administered 2021-06-29: 650 mg via ORAL
  Filled 2021-06-29: qty 2

## 2021-06-29 NOTE — Patient Instructions (Signed)
[facility] ?  Discharge Instructions: ?Thank you for choosing [facility] to provide your oncology and hematology care.  ? ?If you have a lab appointment with the [facility], please go directly to the [facility] and check in at the registration area. ?  ?Wear comfortable clothing and clothing appropriate for easy access to any Portacath or PICC line.  ? ?We strive to give you quality time with your provider. You may need to reschedule your appointment if you arrive late (15 or more minutes).  Arriving late affects you and other patients whose appointments are after yours.  Also, if you miss three or more appointments without notifying the office, you may be dismissed from the clinic at the provider?s discretion.    ?  ?For prescription refill requests, have your pharmacy contact our office and allow 72 hours for refills to be completed.   ? ?Today you received the following chemotherapy and/or immunotherapy agents: trastuzumab-anns and pertuzumab    ?  ?To help prevent nausea and vomiting after your treatment, we encourage you to take your nausea medication as directed. ? ?BELOW ARE SYMPTOMS THAT SHOULD BE REPORTED IMMEDIATELY: ?*FEVER GREATER THAN 100.4 F (38 ?C) OR HIGHER ?*CHILLS OR SWEATING ?*NAUSEA AND VOMITING THAT IS NOT CONTROLLED WITH YOUR NAUSEA MEDICATION ?*UNUSUAL SHORTNESS OF BREATH ?*UNUSUAL BRUISING OR BLEEDING ?*URINARY PROBLEMS (pain or burning when urinating, or frequent urination) ?*BOWEL PROBLEMS (unusual diarrhea, constipation, pain near the anus) ?TENDERNESS IN MOUTH AND THROAT WITH OR WITHOUT PRESENCE OF ULCERS (sore throat, sores in mouth, or a toothache) ?UNUSUAL RASH, SWELLING OR PAIN  ?UNUSUAL VAGINAL DISCHARGE OR ITCHING  ? ?Items with * indicate a potential emergency and should be followed up as soon as possible or go to the Emergency Department if any problems should occur. ? ?Please show the CHEMOTHERAPY ALERT CARD or IMMUNOTHERAPY  ALERT CARD at check-in to the Emergency Department and triage nurse. ? ?Should you have questions after your visit or need to cancel or reschedule your appointment, please contact [facility]  Dept: [phone]  and follow the prompts.  Office hours are 8:00 a.m. to 4:30 p.m. Monday - Friday. Please note that voicemails left after 4:00 p.m. may not be returned until the following business day.  We are closed weekends and major holidays. You have access to a nurse at all times for urgent questions. Please call the main number to the clinic Dept: [phone] and follow the prompts. ? ? ?For any non-urgent questions, you may also contact your provider using MyChart. We now offer e-Visits for anyone 76 and older to request care online for non-urgent symptoms. For details visit [email or web address]. ?  ?Also download the MyChart app! Go to the app store, search "MyChart", open the app, select [facility], and log in with your MyChart username and password. ? ?Due to Covid, a mask is required upon entering the hospital/clinic. If you do not have a mask, one will be given to you upon arrival. For doctor visits, patients may have 1 support person aged 79 or older with them. For treatment visits, patients cannot have anyone with them due to current Covid guidelines and our immunocompromised population.  ? ?

## 2021-07-03 ENCOUNTER — Other Ambulatory Visit: Payer: Self-pay | Admitting: *Deleted

## 2021-07-03 MED ORDER — NITROFURANTOIN MONOHYD MACRO 100 MG PO CAPS: 100.0000 mg | ORAL_CAPSULE | Freq: Two times a day (BID) | ORAL | 0 refills | Status: DC

## 2021-07-03 MED ORDER — FLUCONAZOLE 150 MG PO TABS: 150.0000 mg | ORAL_TABLET | Freq: Every day | ORAL | 0 refills | Status: DC

## 2021-07-03 NOTE — Progress Notes (Signed)
Received call from pt with complaint of burning with urination and frequency.  Pt states she has had several UTI's in the past and the symptoms she is currently experiencing are the same.  Pt denies fever at this time.  Per provider, verbal orders received for pt to receive Macrobid 100 mg p.o BID 7 days as well as Diflucan 150 mg p.o for yeast infection prevention.  Prescription sent to pharmacy on file.  Pt educated and verbalized understanding.

## 2021-07-20 ENCOUNTER — Inpatient Hospital Stay (HOSPITAL_BASED_OUTPATIENT_CLINIC_OR_DEPARTMENT_OTHER): Payer: Commercial Managed Care - PPO | Admitting: Adult Health

## 2021-07-20 ENCOUNTER — Inpatient Hospital Stay: Payer: Commercial Managed Care - PPO | Attending: Hematology and Oncology

## 2021-07-20 ENCOUNTER — Inpatient Hospital Stay: Payer: Commercial Managed Care - PPO

## 2021-07-20 ENCOUNTER — Other Ambulatory Visit: Payer: Self-pay

## 2021-07-20 ENCOUNTER — Encounter: Payer: Self-pay | Admitting: Adult Health

## 2021-07-20 VITALS — BP 112/63 | HR 51 | Temp 97.7°F | Resp 16 | Ht 67.0 in | Wt 177.7 lb

## 2021-07-20 DIAGNOSIS — Z79811 Long term (current) use of aromatase inhibitors: Secondary | ICD-10-CM | POA: Diagnosis not present

## 2021-07-20 DIAGNOSIS — C7951 Secondary malignant neoplasm of bone: Secondary | ICD-10-CM

## 2021-07-20 DIAGNOSIS — Z95828 Presence of other vascular implants and grafts: Secondary | ICD-10-CM

## 2021-07-20 DIAGNOSIS — C50511 Malignant neoplasm of lower-outer quadrant of right female breast: Secondary | ICD-10-CM | POA: Diagnosis not present

## 2021-07-20 DIAGNOSIS — Z170 Estrogen receptor positive status [ER+]: Secondary | ICD-10-CM

## 2021-07-20 DIAGNOSIS — Z79899 Other long term (current) drug therapy: Secondary | ICD-10-CM | POA: Insufficient documentation

## 2021-07-20 DIAGNOSIS — M899 Disorder of bone, unspecified: Secondary | ICD-10-CM

## 2021-07-20 DIAGNOSIS — Z5112 Encounter for antineoplastic immunotherapy: Secondary | ICD-10-CM | POA: Diagnosis present

## 2021-07-20 DIAGNOSIS — Z7189 Other specified counseling: Secondary | ICD-10-CM

## 2021-07-20 LAB — CBC WITH DIFFERENTIAL (CANCER CENTER ONLY)
Abs Immature Granulocytes: 0.02 10*3/uL (ref 0.00–0.07)
Basophils Absolute: 0 10*3/uL (ref 0.0–0.1)
Basophils Relative: 1 %
Eosinophils Absolute: 0.1 10*3/uL (ref 0.0–0.5)
Eosinophils Relative: 2 %
HCT: 38.1 % (ref 36.0–46.0)
Hemoglobin: 12.7 g/dL (ref 12.0–15.0)
Immature Granulocytes: 0 %
Lymphocytes Relative: 34 %
Lymphs Abs: 1.9 10*3/uL (ref 0.7–4.0)
MCH: 30.7 pg (ref 26.0–34.0)
MCHC: 33.3 g/dL (ref 30.0–36.0)
MCV: 92 fL (ref 80.0–100.0)
Monocytes Absolute: 0.4 10*3/uL (ref 0.1–1.0)
Monocytes Relative: 7 %
Neutro Abs: 3.1 10*3/uL (ref 1.7–7.7)
Neutrophils Relative %: 56 %
Platelet Count: 206 10*3/uL (ref 150–400)
RBC: 4.14 MIL/uL (ref 3.87–5.11)
RDW: 12.2 % (ref 11.5–15.5)
WBC Count: 5.6 10*3/uL (ref 4.0–10.5)
nRBC: 0 % (ref 0.0–0.2)

## 2021-07-20 LAB — CMP (CANCER CENTER ONLY)
ALT: 22 U/L (ref 0–44)
AST: 19 U/L (ref 15–41)
Albumin: 4.1 g/dL (ref 3.5–5.0)
Alkaline Phosphatase: 81 U/L (ref 38–126)
Anion gap: 5 (ref 5–15)
BUN: 16 mg/dL (ref 6–20)
CO2: 31 mmol/L (ref 22–32)
Calcium: 9.8 mg/dL (ref 8.9–10.3)
Chloride: 107 mmol/L (ref 98–111)
Creatinine: 0.78 mg/dL (ref 0.44–1.00)
GFR, Estimated: >60 mL/min (ref >60)
Glucose, Bld: 116 mg/dL — ABNORMAL HIGH (ref 70–99)
Potassium: 4 mmol/L (ref 3.5–5.1)
Sodium: 143 mmol/L (ref 135–145)
Total Bilirubin: 0.3 mg/dL (ref 0.3–1.2)
Total Protein: 6.6 g/dL (ref 6.5–8.1)

## 2021-07-20 MED ORDER — ACETAMINOPHEN 325 MG PO TABS
650.0000 mg | ORAL_TABLET | Freq: Once | ORAL | Status: AC
  Administered 2021-07-20: 650 mg via ORAL

## 2021-07-20 MED ORDER — SODIUM CHLORIDE 0.9% FLUSH
10.0000 mL | Freq: Once | INTRAVENOUS | Status: AC
  Administered 2021-07-20: 10 mL

## 2021-07-20 MED ORDER — SODIUM CHLORIDE 0.9 % IV SOLN
420.0000 mg | Freq: Once | INTRAVENOUS | Status: AC
  Administered 2021-07-20: 420 mg via INTRAVENOUS
  Filled 2021-07-20: qty 14

## 2021-07-20 MED ORDER — SODIUM CHLORIDE 0.9 % IV SOLN: Freq: Once | INTRAVENOUS | Status: AC

## 2021-07-20 MED ORDER — TRASTUZUMAB-ANNS CHEMO 150 MG IV SOLR
6.0000 mg/kg | Freq: Once | INTRAVENOUS | Status: AC
  Administered 2021-07-20: 462 mg via INTRAVENOUS
  Filled 2021-07-20: qty 22

## 2021-07-20 MED ORDER — DIPHENHYDRAMINE HCL 25 MG PO CAPS
25.0000 mg | ORAL_CAPSULE | Freq: Once | ORAL | Status: AC
  Administered 2021-07-20: 25 mg via ORAL

## 2021-07-20 MED ORDER — ZOLEDRONIC ACID 4 MG/100ML IV SOLN
4.0000 mg | Freq: Once | INTRAVENOUS | Status: DC
  Administered 2021-07-20: 4 mg via INTRAVENOUS

## 2021-07-20 NOTE — Progress Notes (Signed)
Patient will receive Zometa on 7/17 per MD.  Acquanetta Belling, RPH, BCPS, BCOP 07/20/2021 12:01 PM

## 2021-07-20 NOTE — Patient Instructions (Signed)
[facility]   Discharge Instructions: Thank you for choosing [facility] to provide your oncology and hematology care.   If you have a lab appointment with the [facility], please go directly to the [facility] and check in at the registration area.   Wear comfortable clothing and clothing appropriate for easy access to any Portacath or PICC line.   We strive to give you quality time with your provider. You may need to reschedule your appointment if you arrive late (15 or more minutes).  Arriving late affects you and other patients whose appointments are after yours.  Also, if you miss three or more appointments without notifying the office, you may be dismissed from the clinic at the provider's discretion.      For prescription refill requests, have your pharmacy contact our office and allow 72 hours for refills to be completed.    Today you received the following chemotherapy and/or immunotherapy agents: trastuzumab-anns and pertuzumab      To help prevent nausea and vomiting after your treatment, we encourage you to take your nausea medication as directed.  BELOW ARE SYMPTOMS THAT SHOULD BE REPORTED IMMEDIATELY: *FEVER GREATER THAN 100.4 F (38 C) OR HIGHER *CHILLS OR SWEATING *NAUSEA AND VOMITING THAT IS NOT CONTROLLED WITH YOUR NAUSEA MEDICATION *UNUSUAL SHORTNESS OF BREATH *UNUSUAL BRUISING OR BLEEDING *URINARY PROBLEMS (pain or burning when urinating, or frequent urination) *BOWEL PROBLEMS (unusual diarrhea, constipation, pain near the anus) TENDERNESS IN MOUTH AND THROAT WITH OR WITHOUT PRESENCE OF ULCERS (sore throat, sores in mouth, or a toothache) UNUSUAL RASH, SWELLING OR PAIN  UNUSUAL VAGINAL DISCHARGE OR ITCHING   Items with * indicate a potential emergency and should be followed up as soon as possible or go to the Emergency Department if any problems should occur.  Please show the CHEMOTHERAPY ALERT CARD or IMMUNOTHERAPY  ALERT CARD at check-in to the Emergency Department and triage nurse.  Should you have questions after your visit or need to cancel or reschedule your appointment, please contact [facility]  Dept: [phone]  and follow the prompts.  Office hours are 8:00 a.m. to 4:30 p.m. Monday - Friday. Please note that voicemails left after 4:00 p.m. may not be returned until the following business day.  We are closed weekends and major holidays. You have access to a nurse at all times for urgent questions. Please call the main number to the clinic Dept: [phone] and follow the prompts.   For any non-urgent questions, you may also contact your provider using MyChart. We now offer e-Visits for anyone 57 and older to request care online for non-urgent symptoms. For details visit [email or web address].   Also download the MyChart app! Go to the app store, search "MyChart", open the app, select [facility], and log in with your MyChart username and password.  Due to Covid, a mask is required upon entering the hospital/clinic. If you do not have a mask, one will be given to you upon arrival. For doctor visits, patients may have 1 support person aged 57 or older with them. For treatment visits, patients cannot have anyone with them due to current Covid guidelines and our immunocompromised population.

## 2021-07-20 NOTE — Assessment & Plan Note (Signed)
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  Joint pains and stiffness: Unchanged since changing to letrozole treatment. 2.  Diarrhea from Perjeta remains stable.  Kim Archer is doing moderately well today.  She has no clinical sign of breast cancer progression.  She will proceed with her next treatment of Herceptin and Perjeta.  She is due to receive Zometa today as well.  She will continue taking letrozole daily.  Her restaging scans that are due August 29, 2021 have not yet been scheduled.  I asked my nurse to help get these arranged.  I also placed orders for her to undergo echocardiogram in the next couple weeks.  Kim Archer will return in 3 weeks for treatment and in 6 weeks for labs, follow-up with Dr. Lindi Adie, treatment.

## 2021-07-20 NOTE — Progress Notes (Signed)
Kim Archer:    Group, [facility] [address] [place] [address]   DIAGNOSIS:  Cancer Staging  Malignant neoplasm of lower-outer quadrant of right breast of female, estrogen receptor positive ([facility] [place]) Staging form: Breast, AJCC 8th Edition - Clinical stage from 07/02/2020: Stage IV (cT2, cN1, cM1, G3, ER+, PR+, HER2+) - Signed by Chauncey Cruel, MD on 08/04/2020 Stage prefix: Initial diagnosis Histologic grading system: 3 grade system Laterality: Right Staged by: Pathologist and managing physician Stage used in treatment planning: Yes National guidelines used in treatment planning: Yes Type of national guideline used in treatment planning: NCCN   SUMMARY OF ONCOLOGIC HISTORY: Oncology History  Malignant neoplasm of lower-outer quadrant of right breast of female, estrogen receptor positive ([facility])  03/22/2016 Surgery   left lumpectomy 03/22/2016 showing atypical lobular hyperplasia prophylactic tamoxifen taken from 04/2016 through 03/2019   06/06/2019 Genetic Testing   Negative genetic testing:  No pathogenic variants detected on the [facility] panel, ordered by Dr. Helane Rima at [facility]. Two variants of uncertain significance (VUS) were detected - one in the BRIP1 gene called c.2863A>C and a second in the NBN gene called c.643C>T. The report date is 06/06/2019.  The [facility] gene panel offered by [employer] includes sequencing and deletion/duplication testing of the following 35 genes: APC, ATM, AXIN2, BARD1, BMPR1A, BRCA1, BRCA2, BRIP1, CDH1, CDK4, CDKN2A, CHEK2, EPCAM (large rearrangement only), HOXB13 (sequencing only), GALNT12, MLH1, MSH2, MSH3 (excluding repetitive portions of exon 1), MSH6, MUTYH, NBN, NTHL1, PALB2, PMS2, PTEN, RAD51C, RAD51D, RNF43, RPS20, SMAD4, STK11, and TP53. Sequencing was performed for select regions of POLE and POLD1, and large rearrangement analysis was performed for select  regions of GREM1.     06/25/2020 Relapse/Recurrence   right breast lower outer quadrant biopsy 06/25/2020 shows a clinical T2 N1, stage IB invasive ductal carcinoma, grade 2 or 3, triple positive, with an MIB-1 of 40%   07/02/2020 Cancer Staging   Staging form: Breast, AJCC 8th Edition - Clinical stage from 07/02/2020: Stage IV (cT2, cN1, cM1, G3, ER+, PR+, HER2+) - Signed by Chauncey Cruel, MD on 08/04/2020 Stage prefix: Initial diagnosis Histologic grading system: 3 grade system Laterality: Right Staged by: Pathologist and managing physician Stage used in treatment planning: Yes National guidelines used in treatment planning: Yes Type of national guideline used in treatment planning: NCCN    07/16/2020 - 09/30/2020 Chemotherapy   neoadjuvant chemotherapy consisting of docetaxel, carboplatin, trastuzumab and pertuzumab every 21 days x 4 cycles started 07/28/2020, last chemotherapy 09/30/2020       08/01/2020 PET scan   PET scan 08/01/2020 shows no hypermetabolism in the 1.2 cm right liver lesion; bony hypermetabolism consistent with known mets   08/19/2020 - 10/21/2020 Chemotherapy   Patient is on Treatment Plan : BREAST  Trastuzumab + Pertuzumab q21d       10/07/2020 Imaging    total spinal MRI 10/07/2020 shows stable to improved disease, no extraosseous extension, pathologic fracture, epidural or intracanalicular involvement MRI of the breast 10/19/2020 shows a complete imaging response in the breast, with new ring-enhancing lesions in the left breast with MRI guided biopsy 11/10/2020 showing no evidence of malignancy   11/11/2020 -  Chemotherapy   Patient is on Treatment Plan : BREAST Trastuzumab + Pertuzumab q21d      01/02/2021 Surgery   right lumpectomy with no sentinel lymph node sampling 01/02/2021 showed scattered foci of residual invasive ductal carcinoma, grade 2, the largest measuring 0.8  cm.  Margins were negative   01/02/2021 -  Anti-estrogen oral therapy   anastrozole  started 01/12/2021   Malignant neoplasm metastatic to bone ([facility] [facility])  08/04/2020 Initial Diagnosis   Bone metastases ([facility])    11/11/2020 -  Chemotherapy   Patient is on Treatment Plan : BREAST Trastuzumab + Pertuzumab q21d        CURRENT THERAPY: Herceptin, Perjeta, letrozole, Zometa  INTERVAL HISTORY: Kim Archer 56 y.o. female returns for follow-Archer of her metastatic breast cancer.  She is currently receiving Herceptin Perjeta letrozole and Zometa.    Her most recent echocardiogram was completed on May 12, 2021 and showed normal left ventricular ejection fraction of 60 to 65%.  Her global longitudinal strain was normal at that time as well.  She was changed to letrozole at her last appointment by Dr. Payton Mccallum she tells me that she has not noticed a significant change in her leg pain however.  Otherwise she is feeling moderately well and is tolerating treatment without significant difficulty.  Her next restaging scans are ordered to be completed mid-July since she is going to a wedding with her husband in [place] in a couple of weeks.   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  MEDICAL HISTORY: Past Medical History:  Diagnosis Date   Allergy    Anemia    due to heavy periods   Atypical ductal hyperplasia of left breast    History of kidney stones    HSV-2 (herpes simplex virus 2) infection    PONV (postoperative nausea and  vomiting)    right breast ca 06/2020   Breast cancer    SURGICAL HISTORY: Past Surgical History:  Procedure Laterality Date   BREAST CYST EXCISION Left 03/22/2016   ALH-BENIGN   BREAST LUMPECTOMY WITH RADIOACTIVE SEED LOCALIZATION Left 03/22/2016   Procedure: LEFT BREAST LUMPECTOMY WITH RADIOACTIVE SEED LOCALIZATION;  Surgeon: Autumn Messing III, MD;  Location: [facility];  Service: General;  Laterality: Left;   BREAST LUMPECTOMY WITH RADIOACTIVE SEED LOCALIZATION Right 01/02/2021   Procedure: RIGHT BREAST LUMPECTOMY WITH RADIOACTIVE SEED LOCALIZATION X2;  Surgeon: Jovita Kussmaul, MD;  Location: [facility];  Service: General;  Laterality: Right;   CESAREAN SECTION     x3   COMBINED HYSTEROSCOPY DIAGNOSTIC / D&C  07/17/2018   EYE SURGERY     lt lens replacement   PORTACATH PLACEMENT Left 07/07/2020   Procedure: INSERTION PORT-A-CATH;  Surgeon: Jovita Kussmaul, MD;  Location: [facility] OR;  Service: General;  Laterality: Left;   TUBAL LIGATION      SOCIAL HISTORY: Social History   Socioeconomic History   Marital status: Married    Spouse name: Not on file   Number of children: Not on file   Years of education: Not on file   Highest education level: Not on file  Occupational History   Not on file  Tobacco Use   Smoking status: Never   Smokeless tobacco: Never  Vaping Use  Vaping Use: Never used  Substance and Sexual Activity   Alcohol use: No   Drug use: No   Sexual activity: Not on file    Comment: tubal ligation  Other Topics Concern   Not on file  Social History Narrative   Not on file   Social Determinants of Health   Financial Resource Strain: Not on file  Food Insecurity: Not on file  Transportation Needs: Not on file  Physical Activity: Not on file  Stress: Not on file  Social Connections: Not on file  Intimate Partner Violence: Not on file    FAMILY HISTORY: Family History  Problem Relation Age of Onset   Breast cancer Mother     Melanoma Father    Leukemia Sister    Liver cancer Brother        stomach   Breast cancer Maternal Aunt    Colon cancer Neg Hx    Colon polyps Neg Hx    Esophageal cancer Neg Hx    Rectal cancer Neg Hx    Stomach cancer Neg Hx     Review of Systems  Constitutional:  Positive for fatigue. Negative for appetite change, chills, fever and unexpected weight change.  HENT:   Negative for hearing loss, lump/mass and trouble swallowing.   Eyes:  Negative for eye problems and icterus.  Respiratory:  Negative for chest tightness, cough and shortness of breath.   Cardiovascular:  Negative for chest pain, leg swelling and palpitations.  Gastrointestinal:  Positive for diarrhea (manageable). Negative for abdominal distention, abdominal pain, constipation, nausea and vomiting.  Endocrine: Negative for hot flashes.  Genitourinary:  Negative for difficulty urinating.   Musculoskeletal:  Positive for arthralgias.  Skin:  Negative for itching and rash.  Neurological:  Negative for dizziness, extremity weakness, headaches and numbness.  Hematological:  Negative for adenopathy. Does not bruise/bleed easily.  Psychiatric/Behavioral:  Negative for depression. The patient is not nervous/anxious.      PHYSICAL EXAMINATION  ECOG PERFORMANCE STATUS: 1 - Symptomatic but completely ambulatory  Vitals:   07/20/21 1119  BP: 112/63  Pulse: (!) 51  Resp: 16  Temp: 97.7 F (36.5 C)  SpO2: 100%    Physical Exam Constitutional:      General: She is not in acute distress.    Appearance: Normal appearance. She is not toxic-appearing.  HENT:     Head: Normocephalic and atraumatic.  Eyes:     General: No scleral icterus. Cardiovascular:     Rate and Rhythm: Normal rate and regular rhythm.     Pulses: Normal pulses.     Heart sounds: Normal heart sounds.  Pulmonary:     Effort: Pulmonary effort is normal.     Breath sounds: Normal breath sounds.  Abdominal:     General: Abdomen is flat. Bowel  sounds are normal. There is no distension.     Palpations: Abdomen is soft.     Tenderness: There is no abdominal tenderness.  Musculoskeletal:        General: No swelling.     Cervical back: Neck supple.  Lymphadenopathy:     Cervical: No cervical adenopathy.  Skin:    General: Skin is warm and dry.     Findings: No rash.  Neurological:     General: No focal deficit present.     Mental Status: She is alert.  Psychiatric:        Mood and Affect: Mood normal.        Behavior: Behavior normal.  LABORATORY DATA:  CBC    Component Value Date/Time   WBC 5.6 07/20/2021 1114   WBC 5.4 05/18/2021 0838   RBC 4.14 07/20/2021 1114   HGB 12.7 07/20/2021 1114   HGB 12.7 05/11/2016 1508   HCT 38.1 07/20/2021 1114   HCT 38.9 05/11/2016 1508   PLT 206 07/20/2021 1114   PLT 244 05/11/2016 1508   MCV 92.0 07/20/2021 1114   MCV 88.9 05/11/2016 1508   MCH 30.7 07/20/2021 1114   MCHC 33.3 07/20/2021 1114   RDW 12.2 07/20/2021 1114   RDW 16.8 (H) 05/11/2016 1508   LYMPHSABS 1.9 07/20/2021 1114   LYMPHSABS 2.7 05/11/2016 1508   MONOABS 0.4 07/20/2021 1114   MONOABS 0.5 05/11/2016 1508   EOSABS 0.1 07/20/2021 1114   EOSABS 0.2 05/11/2016 1508   BASOSABS 0.0 07/20/2021 1114   BASOSABS 0.0 05/11/2016 1508    CMP     Component Value Date/Time   NA 142 06/29/2021 0815   NA 142 05/11/2016 1508   K 3.6 06/29/2021 0815   K 4.3 05/11/2016 1508   CL 107 06/29/2021 0815   CO2 28 06/29/2021 0815   CO2 26 05/11/2016 1508   GLUCOSE 116 (H) 06/29/2021 0815   GLUCOSE 90 05/11/2016 1508   BUN 22 (H) 06/29/2021 0815   BUN 10.9 05/11/2016 1508   CREATININE 0.94 06/29/2021 0815   CREATININE 1.0 05/11/2016 1508   CALCIUM 9.7 06/29/2021 0815   CALCIUM 9.5 05/11/2016 1508   PROT 6.8 06/29/2021 0815   PROT 7.0 05/11/2016 1508   ALBUMIN 4.2 06/29/2021 0815   ALBUMIN 3.9 05/11/2016 1508   AST 21 06/29/2021 0815   AST 19 05/11/2016 1508   ALT 25 06/29/2021 0815   ALT 23 05/11/2016 1508    ALKPHOS 75 06/29/2021 0815   ALKPHOS 98 05/11/2016 1508   BILITOT 0.4 06/29/2021 0815   BILITOT 0.36 05/11/2016 1508   GFRNONAA >60 06/29/2021 0815      ASSESSMENT and THERAPY PLAN:   Malignant neoplasm of lower-outer quadrant of right breast of female, estrogen receptor positive ([place]) 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  Joint pains and stiffness: Unchanged since changing to letrozole treatment. 2.  Diarrhea from Perjeta remains stable.  Kim Archer is doing moderately well today.  She has no clinical sign of breast cancer progression.  She will proceed with her next treatment of Herceptin and Perjeta.  She is due to receive Zometa today as well.  She will continue taking letrozole daily.  Her restaging scans that are due August 29, 2021 have not yet been scheduled.  I asked my nurse to help get these arranged.  I also placed orders for her to undergo echocardiogram in the next couple weeks.  Kim Archer will return in 3 weeks for treatment and in 6 weeks for labs, follow-Archer with Dr. Lindi Adie, treatment.     All questions were answered. The patient knows to call the clinic with any problems, questions or concerns. We can certainly see the patient much sooner if necessary.  Total encounter time:20 minutes*in face-to-face visit time, chart review, lab review, care coordination, order entry, and documentation of the encounter time.    Wilber Bihari, NP 07/20/21 11:59 AM Medical Oncology and Hematology [facility] [place], [place] [address] Tel. [phone]    Fax. [phone]  *Total Encounter Time as defined  by the Centers for Medicare and Medicaid Services includes, in addition to the face-to-face time of a patient visit (documented in the note above) non-face-to-face time: obtaining and reviewing  outside history, ordering and reviewing medications, tests or procedures, care coordination (communications with other health care professionals or caregivers) and documentation in the medical record.

## 2021-07-21 ENCOUNTER — Telehealth: Payer: Self-pay

## 2021-07-21 NOTE — Telephone Encounter (Signed)
Pt is scheduled for CT C/A/P 08/14/21 at 0915. Pt will arrive at 0715 to drink clear contrast. Tedra Coupe in xray support is aware. She also knows to remain NPO 4 hrs prior. Pt was also scheduled for echo 6/14 at 1000 but pt states she will be going out of town that day so she was provided with number to echo scheduling to r/s. She knows to schedule the echo before her next tx scheduled for 6/26. Pt knows to call with any further questions/concerns.

## 2021-07-29 ENCOUNTER — Other Ambulatory Visit (HOSPITAL_COMMUNITY): Payer: Commercial Managed Care - PPO

## 2021-08-03 ENCOUNTER — Ambulatory Visit (HOSPITAL_COMMUNITY)
Admission: RE | Admit: 2021-08-03 | Discharge: 2021-08-03 | Disposition: A | Discharge status: Home / Self Care | Payer: Commercial Managed Care - PPO | Source: Ambulatory Visit | Attending: Adult Health | Admitting: Adult Health

## 2021-08-03 DIAGNOSIS — C50911 Malignant neoplasm of unspecified site of right female breast: Secondary | ICD-10-CM | POA: Insufficient documentation

## 2021-08-03 DIAGNOSIS — C7951 Secondary malignant neoplasm of bone: Secondary | ICD-10-CM | POA: Insufficient documentation

## 2021-08-03 DIAGNOSIS — Z0189 Encounter for other specified special examinations: Secondary | ICD-10-CM

## 2021-08-03 LAB — ECHOCARDIOGRAM COMPLETE
AR max vel: 2.48 cm2
AV Area VTI: 2.4 cm2
AV Area mean vel: 2.43 cm2
AV Mean grad: 4 mmHg
AV Peak grad: 7.2 mmHg
Ao pk vel: 1.34 m/s
Area-P 1/2: 3.83 cm2
S' Lateral: 3 cm

## 2021-08-10 ENCOUNTER — Inpatient Hospital Stay: Payer: Commercial Managed Care - PPO

## 2021-08-10 ENCOUNTER — Other Ambulatory Visit: Payer: Self-pay

## 2021-08-10 VITALS — BP 124/72 | HR 63 | Temp 97.9°F | Resp 18 | Wt 178.8 lb

## 2021-08-10 DIAGNOSIS — C7951 Secondary malignant neoplasm of bone: Secondary | ICD-10-CM

## 2021-08-10 DIAGNOSIS — C50511 Malignant neoplasm of lower-outer quadrant of right female breast: Secondary | ICD-10-CM

## 2021-08-10 DIAGNOSIS — Z7189 Other specified counseling: Secondary | ICD-10-CM

## 2021-08-10 DIAGNOSIS — Z95828 Presence of other vascular implants and grafts: Secondary | ICD-10-CM

## 2021-08-10 DIAGNOSIS — Z5112 Encounter for antineoplastic immunotherapy: Secondary | ICD-10-CM | POA: Diagnosis not present

## 2021-08-10 DIAGNOSIS — M899 Disorder of bone, unspecified: Secondary | ICD-10-CM

## 2021-08-10 LAB — CMP (CANCER CENTER ONLY)
ALT: 30 U/L (ref 0–44)
AST: 31 U/L (ref 15–41)
Albumin: 4.3 g/dL (ref 3.5–5.0)
Alkaline Phosphatase: 83 U/L (ref 38–126)
Anion gap: 7 (ref 5–15)
BUN: 17 mg/dL (ref 6–20)
CO2: 29 mmol/L (ref 22–32)
Calcium: 9.7 mg/dL (ref 8.9–10.3)
Chloride: 107 mmol/L (ref 98–111)
Creatinine: 0.86 mg/dL (ref 0.44–1.00)
GFR, Estimated: >60 mL/min (ref >60)
Glucose, Bld: 119 mg/dL — ABNORMAL HIGH (ref 70–99)
Potassium: 4 mmol/L (ref 3.5–5.1)
Sodium: 143 mmol/L (ref 135–145)
Total Bilirubin: 0.4 mg/dL (ref 0.3–1.2)
Total Protein: 7 g/dL (ref 6.5–8.1)

## 2021-08-10 LAB — CBC WITH DIFFERENTIAL (CANCER CENTER ONLY)
Abs Immature Granulocytes: 0.01 10*3/uL (ref 0.00–0.07)
Basophils Absolute: 0 10*3/uL (ref 0.0–0.1)
Basophils Relative: 1 %
Eosinophils Absolute: 0.1 10*3/uL (ref 0.0–0.5)
Eosinophils Relative: 3 %
HCT: 39.8 % (ref 36.0–46.0)
Hemoglobin: 13.5 g/dL (ref 12.0–15.0)
Immature Granulocytes: 0 %
Lymphocytes Relative: 35 %
Lymphs Abs: 1.8 10*3/uL (ref 0.7–4.0)
MCH: 31.2 pg (ref 26.0–34.0)
MCHC: 33.9 g/dL (ref 30.0–36.0)
MCV: 91.9 fL (ref 80.0–100.0)
Monocytes Absolute: 0.3 10*3/uL (ref 0.1–1.0)
Monocytes Relative: 6 %
Neutro Abs: 2.8 10*3/uL (ref 1.7–7.7)
Neutrophils Relative %: 55 %
Platelet Count: 201 10*3/uL (ref 150–400)
RBC: 4.33 MIL/uL (ref 3.87–5.11)
RDW: 12.7 % (ref 11.5–15.5)
WBC Count: 5.1 10*3/uL (ref 4.0–10.5)
nRBC: 0 % (ref 0.0–0.2)

## 2021-08-10 MED ORDER — DIPHENHYDRAMINE HCL 25 MG PO CAPS
25.0000 mg | ORAL_CAPSULE | Freq: Once | ORAL | Status: AC
  Administered 2021-08-10: 25 mg via ORAL
  Filled 2021-08-10: qty 1

## 2021-08-10 MED ORDER — SODIUM CHLORIDE 0.9% FLUSH
10.0000 mL | Freq: Once | INTRAVENOUS | Status: AC
  Administered 2021-08-10: 10 mL

## 2021-08-10 MED ORDER — ACETAMINOPHEN 325 MG PO TABS
650.0000 mg | ORAL_TABLET | Freq: Once | ORAL | Status: AC
  Administered 2021-08-10: 650 mg via ORAL
  Filled 2021-08-10: qty 2

## 2021-08-10 MED ORDER — TRASTUZUMAB-ANNS CHEMO 150 MG IV SOLR
6.0000 mg/kg | Freq: Once | INTRAVENOUS | Status: AC
  Administered 2021-08-10: 462 mg via INTRAVENOUS
  Filled 2021-08-10: qty 22

## 2021-08-10 MED ORDER — SODIUM CHLORIDE 0.9 % IV SOLN: Freq: Once | INTRAVENOUS | Status: AC

## 2021-08-10 MED ORDER — SODIUM CHLORIDE 0.9 % IV SOLN
420.0000 mg | Freq: Once | INTRAVENOUS | Status: AC
  Administered 2021-08-10: 420 mg via INTRAVENOUS
  Filled 2021-08-10: qty 14

## 2021-08-10 MED ORDER — SODIUM CHLORIDE 0.9% FLUSH
10.0000 mL | INTRAVENOUS | Status: DC | PRN
  Administered 2021-08-10: 10 mL

## 2021-08-10 MED ORDER — HEPARIN SOD (PORK) LOCK FLUSH 100 UNIT/ML IV SOLN
500.0000 [IU] | Freq: Once | INTRAVENOUS | Status: AC | PRN
  Administered 2021-08-10: 500 [IU]

## 2021-08-14 ENCOUNTER — Ambulatory Visit (HOSPITAL_COMMUNITY)
Admission: RE | Admit: 2021-08-14 | Discharge: 2021-08-14 | Disposition: A | Discharge status: Home / Self Care | Payer: Commercial Managed Care - PPO | Source: Ambulatory Visit | Attending: Hematology and Oncology | Admitting: Hematology and Oncology

## 2021-08-14 DIAGNOSIS — C50511 Malignant neoplasm of lower-outer quadrant of right female breast: Secondary | ICD-10-CM | POA: Insufficient documentation

## 2021-08-14 DIAGNOSIS — Z170 Estrogen receptor positive status [ER+]: Secondary | ICD-10-CM | POA: Insufficient documentation

## 2021-08-14 MED ORDER — IOHEXOL 9 MG/ML PO SOLN: 500.0000 mL | ORAL | Status: AC

## 2021-08-14 MED ORDER — IOHEXOL 300 MG/ML  SOLN
100.0000 mL | Freq: Once | INTRAMUSCULAR | Status: AC | PRN
Start: 2021-08-14 — End: 2021-08-14
  Administered 2021-08-14: 100 mL via INTRAVENOUS

## 2021-08-14 MED ORDER — SODIUM CHLORIDE (PF) 0.9 % IJ SOLN
INTRAMUSCULAR | Status: AC
  Filled 2021-08-14: qty 50

## 2021-08-14 MED ORDER — IOHEXOL 9 MG/ML PO SOLN
ORAL | Status: AC
  Filled 2021-08-14: qty 1000

## 2021-08-17 NOTE — Progress Notes (Signed)
Patient Care Team: [facility], [facility] as PCP - General [name], MD as Consulting Physician (Obstetrics and Gynecology) Jovita Kussmaul, MD as Consulting Physician (General Surgery) Gery Pray, MD as Consulting Physician (Radiation Oncology) Larey Dresser, MD as Consulting Physician (Cardiology) Nicholas Lose, MD as Medical Oncologist (Hematology and Oncology)  DIAGNOSIS:  Encounter Diagnosis  Name Primary?   Malignant neoplasm of lower-outer quadrant of right breast of female, estrogen receptor positive ([place])     SUMMARY OF ONCOLOGIC HISTORY: Oncology History  Malignant neoplasm of lower-outer quadrant of right breast of female, estrogen receptor positive ([place])  03/22/2016 Surgery   left lumpectomy 03/22/2016 showing atypical lobular hyperplasia prophylactic tamoxifen taken from 04/2016 through 03/2019   06/06/2019 Genetic Testing   Negative genetic testing:  No pathogenic variants detected on the [facility] panel, ordered by Dr. Helane Rima at [facility]. Two variants of uncertain significance (VUS) were detected - one in the BRIP1 gene called c.2863A>C and a second in the NBN gene called c.643C>T. The report date is 06/06/2019.  The [facility] gene panel offered by [employer] includes sequencing and deletion/duplication testing of the following 35 genes: APC, ATM, AXIN2, BARD1, BMPR1A, BRCA1, BRCA2, BRIP1, CDH1, CDK4, CDKN2A, CHEK2, EPCAM (large rearrangement only), HOXB13 (sequencing only), GALNT12, MLH1, MSH2, MSH3 (excluding repetitive portions of exon 1), MSH6, MUTYH, NBN, NTHL1, PALB2, PMS2, PTEN, RAD51C, RAD51D, RNF43, RPS20, SMAD4, STK11, and TP53. Sequencing was performed for select regions of POLE and POLD1, and large rearrangement analysis was performed for select regions of GREM1.     06/25/2020 Relapse/Recurrence   right breast lower outer quadrant biopsy 06/25/2020 shows a clinical T2 N1, stage IB invasive ductal  carcinoma, grade 2 or 3, triple positive, with an MIB-1 of 40%   07/02/2020 Cancer Staging   Staging form: Breast, AJCC 8th Edition - Clinical stage from 07/02/2020: Stage IV (cT2, cN1, cM1, G3, ER+, PR+, HER2+) - Signed by Chauncey Cruel, MD on 08/04/2020 Stage prefix: Initial diagnosis Histologic grading system: 3 grade system Laterality: Right Staged by: Pathologist and managing physician Stage used in treatment planning: Yes National guidelines used in treatment planning: Yes Type of national guideline used in treatment planning: NCCN   07/16/2020 - 09/30/2020 Chemotherapy   neoadjuvant chemotherapy consisting of docetaxel, carboplatin, trastuzumab and pertuzumab every 21 days x 4 cycles started 07/28/2020, last chemotherapy 09/30/2020       08/01/2020 PET scan   PET scan 08/01/2020 shows no hypermetabolism in the 1.2 cm right liver lesion; bony hypermetabolism consistent with known mets   08/19/2020 - 10/21/2020 Chemotherapy   Patient is on Treatment Plan : BREAST  Trastuzumab + Pertuzumab q21d      10/07/2020 Imaging    total spinal MRI 10/07/2020 shows stable to improved disease, no extraosseous extension, pathologic fracture, epidural or intracanalicular involvement MRI of the breast 10/19/2020 shows a complete imaging response in the breast, with new ring-enhancing lesions in the left breast with MRI guided biopsy 11/10/2020 showing no evidence of malignancy   11/11/2020 -  Chemotherapy   Patient is on Treatment Plan : BREAST Trastuzumab + Pertuzumab q21d     01/02/2021 Surgery   right lumpectomy with no sentinel lymph node sampling 01/02/2021 showed scattered foci of residual invasive ductal carcinoma, grade 2, the largest measuring 0.8 cm.  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 with above-mentioned history of metastatic breast cancer is currently on Herceptin and Perjeta maintenance. She presents to the clinic today for a follow-up. States that she is having manageable diarrhea. She has 1-2 a day. She complains of spots on her arm looks like pimples. Denies rash. She had some concerns about what feeds on estrogen so she can be more careful on what she intakes. She complains of her speech she states sometimes she gets tongue tide. She has noticed it since she has been on chemo. She complains of left arm aching and radiating pain started a couple of weeks ago. She is lifting weight for exercising.  ALLERGIES:  is allergic to ciprofloxacin, ciprofibrate, gabapentin, sulfa antibiotics, and venlafaxine.  MEDICATIONS:  Current Outpatient Medications  Medication Sig Dispense Refill   Biotin 5 MG CAPS Take 1 capsule (5 mg total) by mouth daily.  0   Boron 3 MG CAPS Take by mouth.     Calcium 500 MG tablet Take 600 mg by mouth.     chlorpheniramine-HYDROcodone 10-8 MG/5ML hydrocodone 10 mg-chlorpheniramine 8 mg/5 mL oral susp extend.rel 12hr  TAKE 5 ML BY MOUTH EVERY 12 HOURS AS NEEDED     fluconazole (DIFLUCAN) 150 MG tablet Take 1 tablet (150 mg total) by mouth daily. 2 tablet 0   ibuprofen (ADVIL) 800 MG tablet ibuprofen 800 mg tablet  TAKE 1 TABLET BY MOUTH THREE TIMES A DAY     ipratropium (ATROVENT) 0.06 % nasal spray Place into both nostrils.     letrozole (FEMARA) 2.5 MG tablet Take 1 tablet (2.5 mg total) by mouth daily. 90 tablet 3   levOCARNitine (CARNITINE PO) Take 1 capsule by mouth daily. L-Carnitine     lidocaine-prilocaine (EMLA) cream Apply topically daily.     loratadine (CLARITIN) 10 MG tablet Take 10 mg by mouth daily.     LORazepam (ATIVAN) 0.5 MG tablet lorazepam 0.5 mg tablet   TAKE 1 TABLET BY MOUTH EVERY 8 HOURS AS NEEDED MAY INCREASE TO 2 TABLETS AS NEEDED     LUTEIN PO Take by mouth.     MAGNESIUM PO Take 400 mg by mouth at bedtime.     Menatetrenone (VITAMIN K2) 100 MCG TABS Take by mouth.     Multiple Vitamin (MULTIVITAMIN WITH MINERALS) TABS tablet Take 1 tablet by mouth daily.     OVER THE COUNTER MEDICATION Take 1 capsule by mouth daily. [place] Mushroom 1229m     tobramycin-dexamethasone (TOBRADEX) ophthalmic solution every 4 (four) hours while awake.     Ubiquinol 50 MG CAPS Take by mouth daily.     VITAMIN D PO 50 mcg.     zinc sulfate 220 (50 Zn) MG capsule Take 220 mg by mouth daily.     No current facility-administered medications for this visit.    PHYSICAL EXAMINATION: ECOG PERFORMANCE STATUS: 1 - Symptomatic but completely ambulatory  Vitals:   08/31/21 0901  BP: 134/80  Pulse: 60  Resp: 17  Temp: (!) 97.3 F (36.3 C)  SpO2: 99%   Filed Weights   08/31/21 0901  Weight: 178 lb 6.4 oz (80.9 kg)      LABORATORY DATA:  I have reviewed the data as listed    Latest  Ref Rng & Units 08/10/2021    8:28 AM 07/20/2021   11:14 AM 06/29/2021    8:15 AM  CMP  Glucose 70 - 99 mg/dL 119  116  116   BUN 6 - 20 mg/dL _0 Creatinine 0.44 - 1.00 mg/dL 0.86  0.78  0.94   Sodium 135 - 145 mmol/L 143  143  142   Potassium 3.5 - 5.1 mmol/L 4.0  4.0  3.6   Chloride 98 - 111 mmol/L 107  107  107   CO2 22 - 32 mmol/L _1 Calcium 8.9 - 10.3 mg/dL 9.7  9.8  9.7   Total Protein 6.5 - 8.1 g/dL 7.0  6.6  6.8   Total Bilirubin 0.3 - 1.2 mg/dL 0.4  0.3  0.4   Alkaline Phos 38 - 126 U/L 83  81  75   AST 15 - 41 U/L _2 ALT 0 - 44 U/L _3 Lab Results  Component Value Date   WBC 5.3 08/31/2021   HGB 13.3 08/31/2021   HCT 39.4 08/31/2021   MCV 91.0 08/31/2021   PLT 203 08/31/2021   NEUTROABS 2.9 08/31/2021    ASSESSMENT & PLAN:  Malignant neoplasm of lower-outer quadrant of right breast of female, estrogen  receptor positive ([place]) Metastatic Breast cancer ER/PR and Her 2 Positive: S/p NAC with TCHP and BCS Current Treatment: HP maintenance with Anastrozole and Xgeva switched to letrozole 06/08/2021   Toxicities:  1.  Joint pains and stiffness:on letrozole 2. hot flashes 3.  Depression/tearfulness: Stopped it because of jitteriness 4.  Diarrhea from Perjeta: Manageable   03/14/2021: CT CAP: Previously demonstrated right axillary and subpectoral lymph nodes resolved.  Bone metastases have become sclerotic suggestive of response to treatment.  Indeterminate lesion right hepatic lobe: Not visualized  08/15/2021: CT CAP: No findings of recurrence of breast cancer in the chest.  Stable sclerotic bone lesions consistent with treated metastatic disease.  No abnormalities in the liver.  Right arm pain: Could be related to her work.  I sent the prescription for lymphedema sleeve.  Continue with Herceptin Perjeta maintenance along with anastrozole and Xgeva Zometa will be every 3 months.       No orders of the defined types were placed in this encounter.  The patient has a good understanding of the overall plan. she agrees with it. she will call with any problems that may develop before the next visit here. Total time spent: 30 mins including face to face time and time spent for planning, charting and co-ordination of care   Harriette Ohara, MD 08/31/21    I Gardiner Coins am scribing for Dr. Lindi Adie  I have reviewed the above documentation for accuracy and completeness, and I agree with the above.

## 2021-08-19 ENCOUNTER — Telehealth: Payer: Self-pay | Admitting: *Deleted

## 2021-08-19 NOTE — Telephone Encounter (Signed)
Disability paperwork completed by this nurse today currently placed in designated mail bin for collaborative pick up.  Awaiting provider review and signature.

## 2021-08-20 ENCOUNTER — Telehealth: Payer: Self-pay | Admitting: *Deleted

## 2021-08-20 NOTE — Telephone Encounter (Signed)
Message from "NISHITA ISAACKS [phone]) calling about form brought in on last visit June 26th.  Have not heard from anyone about this.  Please call."  Reached Arnetha Courser to advise of form awaiting provider review and signature.  Needs required [facility] authorization for use and disclosure signed on next visit or may e-mal to [email or web address].   "Lets do e-mal to expedite this.  Do you also have e-mail address of [email or web address]?  Send authorization to this e-mail."  E-mail sent and successfully returned at 11:27 am.

## 2021-08-24 NOTE — Telephone Encounter (Signed)
E-mail received from Arnetha Courser that perhaps a 'typo error' with leave start and end dates the same (07/16/2021). Corrected question number 8 with end date as 07/17/2022, e-mailed to patient.   Fax number used '[phone]' was noted from previous form in Media is a phone extension.  Form ready for pick-up on next appointment.

## 2021-08-31 ENCOUNTER — Inpatient Hospital Stay: Payer: Commercial Managed Care - PPO

## 2021-08-31 ENCOUNTER — Inpatient Hospital Stay: Payer: Commercial Managed Care - PPO | Attending: Hematology and Oncology | Admitting: Hematology and Oncology

## 2021-08-31 ENCOUNTER — Other Ambulatory Visit: Payer: Self-pay

## 2021-08-31 DIAGNOSIS — C7951 Secondary malignant neoplasm of bone: Secondary | ICD-10-CM | POA: Insufficient documentation

## 2021-08-31 DIAGNOSIS — Z79811 Long term (current) use of aromatase inhibitors: Secondary | ICD-10-CM | POA: Diagnosis not present

## 2021-08-31 DIAGNOSIS — Z79899 Other long term (current) drug therapy: Secondary | ICD-10-CM | POA: Diagnosis not present

## 2021-08-31 DIAGNOSIS — C50511 Malignant neoplasm of lower-outer quadrant of right female breast: Secondary | ICD-10-CM

## 2021-08-31 DIAGNOSIS — M899 Disorder of bone, unspecified: Secondary | ICD-10-CM

## 2021-08-31 DIAGNOSIS — Z5112 Encounter for antineoplastic immunotherapy: Secondary | ICD-10-CM | POA: Insufficient documentation

## 2021-08-31 DIAGNOSIS — Z95828 Presence of other vascular implants and grafts: Secondary | ICD-10-CM

## 2021-08-31 DIAGNOSIS — Z170 Estrogen receptor positive status [ER+]: Secondary | ICD-10-CM | POA: Diagnosis not present

## 2021-08-31 DIAGNOSIS — Z7189 Other specified counseling: Secondary | ICD-10-CM

## 2021-08-31 LAB — CBC WITH DIFFERENTIAL (CANCER CENTER ONLY)
Abs Immature Granulocytes: 0.02 10*3/uL (ref 0.00–0.07)
Basophils Absolute: 0 10*3/uL (ref 0.0–0.1)
Basophils Relative: 1 %
Eosinophils Absolute: 0.2 10*3/uL (ref 0.0–0.5)
Eosinophils Relative: 3 %
HCT: 39.4 % (ref 36.0–46.0)
Hemoglobin: 13.3 g/dL (ref 12.0–15.0)
Immature Granulocytes: 0 %
Lymphocytes Relative: 36 %
Lymphs Abs: 1.9 10*3/uL (ref 0.7–4.0)
MCH: 30.7 pg (ref 26.0–34.0)
MCHC: 33.8 g/dL (ref 30.0–36.0)
MCV: 91 fL (ref 80.0–100.0)
Monocytes Absolute: 0.3 10*3/uL (ref 0.1–1.0)
Monocytes Relative: 6 %
Neutro Abs: 2.9 10*3/uL (ref 1.7–7.7)
Neutrophils Relative %: 54 %
Platelet Count: 203 10*3/uL (ref 150–400)
RBC: 4.33 MIL/uL (ref 3.87–5.11)
RDW: 12.5 % (ref 11.5–15.5)
WBC Count: 5.3 10*3/uL (ref 4.0–10.5)
nRBC: 0 % (ref 0.0–0.2)

## 2021-08-31 LAB — CMP (CANCER CENTER ONLY)
ALT: 28 U/L (ref 0–44)
AST: 23 U/L (ref 15–41)
Albumin: 4.4 g/dL (ref 3.5–5.0)
Alkaline Phosphatase: 79 U/L (ref 38–126)
Anion gap: 5 (ref 5–15)
BUN: 17 mg/dL (ref 6–20)
CO2: 29 mmol/L (ref 22–32)
Calcium: 9.9 mg/dL (ref 8.9–10.3)
Chloride: 107 mmol/L (ref 98–111)
Creatinine: 0.79 mg/dL (ref 0.44–1.00)
GFR, Estimated: >60 mL/min (ref >60)
Glucose, Bld: 94 mg/dL (ref 70–99)
Potassium: 3.9 mmol/L (ref 3.5–5.1)
Sodium: 141 mmol/L (ref 135–145)
Total Bilirubin: 0.4 mg/dL (ref 0.3–1.2)
Total Protein: 7.1 g/dL (ref 6.5–8.1)

## 2021-08-31 MED ORDER — DIPHENHYDRAMINE HCL 25 MG PO CAPS
25.0000 mg | ORAL_CAPSULE | Freq: Once | ORAL | Status: AC
  Administered 2021-08-31: 25 mg via ORAL
  Filled 2021-08-31: qty 1

## 2021-08-31 MED ORDER — SODIUM CHLORIDE 0.9 % IV SOLN: Freq: Once | INTRAVENOUS | Status: AC

## 2021-08-31 MED ORDER — SODIUM CHLORIDE 0.9% FLUSH
10.0000 mL | INTRAVENOUS | Status: DC | PRN
  Administered 2021-08-31: 10 mL

## 2021-08-31 MED ORDER — ACETAMINOPHEN 325 MG PO TABS
650.0000 mg | ORAL_TABLET | Freq: Once | ORAL | Status: AC
  Administered 2021-08-31: 650 mg via ORAL
  Filled 2021-08-31: qty 2

## 2021-08-31 MED ORDER — SODIUM CHLORIDE 0.9 % IV SOLN
420.0000 mg | Freq: Once | INTRAVENOUS | Status: AC
  Administered 2021-08-31: 420 mg via INTRAVENOUS
  Filled 2021-08-31: qty 14

## 2021-08-31 MED ORDER — TRASTUZUMAB-ANNS CHEMO 150 MG IV SOLR
6.0000 mg/kg | Freq: Once | INTRAVENOUS | Status: AC
  Administered 2021-08-31: 462 mg via INTRAVENOUS
  Filled 2021-08-31: qty 22

## 2021-08-31 MED ORDER — HEPARIN SOD (PORK) LOCK FLUSH 100 UNIT/ML IV SOLN
500.0000 [IU] | Freq: Once | INTRAVENOUS | Status: AC | PRN
  Administered 2021-08-31: 500 [IU]

## 2021-08-31 MED ORDER — SODIUM CHLORIDE 0.9% FLUSH
10.0000 mL | Freq: Once | INTRAVENOUS | Status: AC
  Administered 2021-08-31: 10 mL

## 2021-08-31 NOTE — Patient Instructions (Signed)
[facility] ONCOLOGY  Discharge Instructions: Thank you for choosing [facility] to provide your oncology and hematology care.   If you have a lab appointment with the [facility], please go directly to the [facility] and check in at the registration area.   Wear comfortable clothing and clothing appropriate for easy access to any Portacath or PICC line.   We strive to give you quality time with your provider. You may need to reschedule your appointment if you arrive late (15 or more minutes).  Arriving late affects you and other patients whose appointments are after yours.  Also, if you miss three or more appointments without notifying the office, you may be dismissed from the clinic at the provider's discretion.      For prescription refill requests, have your pharmacy contact our office and allow 72 hours for refills to be completed.    Today you received the following chemotherapy and/or immunotherapy agents herceptin, perjeta      To help prevent nausea and vomiting after your treatment, we encourage you to take your nausea medication as directed.  BELOW ARE SYMPTOMS THAT SHOULD BE REPORTED IMMEDIATELY: *FEVER GREATER THAN 100.4 F (38 C) OR HIGHER *CHILLS OR SWEATING *NAUSEA AND VOMITING THAT IS NOT CONTROLLED WITH YOUR NAUSEA MEDICATION *UNUSUAL SHORTNESS OF BREATH *UNUSUAL BRUISING OR BLEEDING *URINARY PROBLEMS (pain or burning when urinating, or frequent urination) *BOWEL PROBLEMS (unusual diarrhea, constipation, pain near the anus) TENDERNESS IN MOUTH AND THROAT WITH OR WITHOUT PRESENCE OF ULCERS (sore throat, sores in mouth, or a toothache) UNUSUAL RASH, SWELLING OR PAIN  UNUSUAL VAGINAL DISCHARGE OR ITCHING   Items with * indicate a potential emergency and should be followed up as soon as possible or go to the Emergency Department if any problems should occur.  Please show the CHEMOTHERAPY ALERT CARD or IMMUNOTHERAPY ALERT CARD at  check-in to the Emergency Department and triage nurse.  Should you have questions after your visit or need to cancel or reschedule your appointment, please contact [facility]  Dept: [phone]  and follow the prompts.  Office hours are 8:00 a.m. to 4:30 p.m. Monday - Friday. Please note that voicemails left after 4:00 p.m. may not be returned until the following business day.  We are closed weekends and major holidays. You have access to a nurse at all times for urgent questions. Please call the main number to the clinic Dept: [phone] and follow the prompts.   For any non-urgent questions, you may also contact your provider using MyChart. We now offer e-Visits for anyone 61 and older to request care online for non-urgent symptoms. For details visit [email or web address].   Also download the MyChart app! Go to the app store, search "MyChart", open the app, select [facility], and log in with your MyChart username and password.  Masks are optional in the cancer centers. If you would like for your care team to wear a mask while they are taking care of you, please let them know. For doctor visits, patients may have with them one support person who is at least 57 years old. At this time, visitors are not allowed in the infusion area.

## 2021-08-31 NOTE — Assessment & Plan Note (Addendum)
Metastatic Breast cancer ER/PR and Her 2 Positive: S/p NAC with TCHP and BCS Current Treatment: HP maintenance with Anastrozole and Xgeva switched to letrozole 06/08/2021  Toxicities:  1.  Joint pains and stiffness:on letrozole 2. hot flashes 3.  Depression/tearfulness: Stopped it because of jitteriness 4.  Diarrhea from Perjeta: Manageable  03/14/2021: CT CAP: Previously demonstrated right axillary and subpectoral lymph nodes resolved. Bone metastases have become sclerotic suggestive of response to treatment. Indeterminate lesion right hepatic lobe: Not visualized  08/15/2021: CT CAP: No findings of recurrence of breast cancer in the chest.  Stable sclerotic bone lesions consistent with treated metastatic disease.  No abnormalities in the liver.  Right arm pain: Could be related to her work.  I sent the prescription for lymphedema sleeve.  Continue with Herceptin Perjeta maintenance along with anastrozole and Xgeva Zometa will be every 3 months.

## 2021-09-01 ENCOUNTER — Telehealth: Payer: Self-pay | Admitting: Hematology and Oncology

## 2021-09-01 NOTE — Telephone Encounter (Signed)
Scheduled appointment per 7/17 los. Patient is aware.

## 2021-09-07 ENCOUNTER — Other Ambulatory Visit: Payer: Self-pay

## 2021-09-08 ENCOUNTER — Other Ambulatory Visit: Payer: Self-pay

## 2021-09-15 ENCOUNTER — Other Ambulatory Visit: Payer: Self-pay

## 2021-09-17 ENCOUNTER — Other Ambulatory Visit: Payer: Self-pay

## 2021-09-28 ENCOUNTER — Other Ambulatory Visit: Payer: Self-pay

## 2021-09-28 ENCOUNTER — Inpatient Hospital Stay: Payer: Commercial Managed Care - PPO | Attending: Hematology and Oncology

## 2021-09-28 VITALS — BP 113/71 | HR 64 | Temp 98.0°F | Resp 20 | Wt 179.2 lb

## 2021-09-28 DIAGNOSIS — Z170 Estrogen receptor positive status [ER+]: Secondary | ICD-10-CM | POA: Insufficient documentation

## 2021-09-28 DIAGNOSIS — Z5112 Encounter for antineoplastic immunotherapy: Secondary | ICD-10-CM | POA: Insufficient documentation

## 2021-09-28 DIAGNOSIS — C7951 Secondary malignant neoplasm of bone: Secondary | ICD-10-CM | POA: Diagnosis not present

## 2021-09-28 DIAGNOSIS — C50511 Malignant neoplasm of lower-outer quadrant of right female breast: Secondary | ICD-10-CM

## 2021-09-28 DIAGNOSIS — Z79899 Other long term (current) drug therapy: Secondary | ICD-10-CM | POA: Insufficient documentation

## 2021-09-28 MED ORDER — HEPARIN SOD (PORK) LOCK FLUSH 100 UNIT/ML IV SOLN
500.0000 [IU] | Freq: Once | INTRAVENOUS | Status: AC | PRN
  Administered 2021-09-28: 500 [IU]

## 2021-09-28 MED ORDER — SODIUM CHLORIDE 0.9 % IV SOLN
420.0000 mg | Freq: Once | INTRAVENOUS | Status: AC
  Administered 2021-09-28: 420 mg via INTRAVENOUS
  Filled 2021-09-28: qty 14

## 2021-09-28 MED ORDER — TRASTUZUMAB-ANNS CHEMO 150 MG IV SOLR
6.0000 mg/kg | Freq: Once | INTRAVENOUS | Status: AC
  Administered 2021-09-28: 462 mg via INTRAVENOUS
  Filled 2021-09-28: qty 22

## 2021-09-28 MED ORDER — DIPHENHYDRAMINE HCL 25 MG PO CAPS
25.0000 mg | ORAL_CAPSULE | Freq: Once | ORAL | Status: AC
  Administered 2021-09-28: 25 mg via ORAL

## 2021-09-28 MED ORDER — SODIUM CHLORIDE 0.9% FLUSH
10.0000 mL | INTRAVENOUS | Status: DC | PRN
  Administered 2021-09-28: 10 mL

## 2021-09-28 MED ORDER — SODIUM CHLORIDE 0.9 % IV SOLN: Freq: Once | INTRAVENOUS | Status: AC

## 2021-09-28 MED ORDER — ACETAMINOPHEN 325 MG PO TABS
650.0000 mg | ORAL_TABLET | Freq: Once | ORAL | Status: AC
  Administered 2021-09-28: 650 mg via ORAL

## 2021-09-28 NOTE — Progress Notes (Signed)
Pt. states she has noticed more popping right side of jaw when she chews her food. Pt. receiving Zometa, last dose 07/20/21. Dr. Lindi Adie notified and pt. instructed to follow up with dentist prior to next appointment.  Pt. states she tolerates treatment without any issues. Declines to stay for 30 minute post observation, vital signs stable, and left via ambulation no respiratory distress noted.

## 2021-09-28 NOTE — Patient Instructions (Addendum)
[facility]  Discharge Instructions: Thank you for choosing [facility] to provide your oncology and hematology care.   If you have a lab appointment with the [facility], please go directly to the [facility] and check in at the registration area.   Wear comfortable clothing and clothing appropriate for easy access to any Portacath or PICC line.   We strive to give you quality time with your provider. You may need to reschedule your appointment if you arrive late (15 or more minutes).  Arriving late affects you and other patients whose appointments are after yours.  Also, if you miss three or more appointments without notifying the office, you may be dismissed from the clinic at the provider's discretion.      For prescription refill requests, have your pharmacy contact our office and allow 72 hours for refills to be completed.    Today you received the following chemotherapy and/or immunotherapy agents: Trastuzumab and Pertuzumab (Perjeta)   To help prevent nausea and vomiting after your treatment, we encourage you to take your nausea medication as directed.  BELOW ARE SYMPTOMS THAT SHOULD BE REPORTED IMMEDIATELY: *FEVER GREATER THAN 100.4 F (38 C) OR HIGHER *CHILLS OR SWEATING *NAUSEA AND VOMITING THAT IS NOT CONTROLLED WITH YOUR NAUSEA MEDICATION *UNUSUAL SHORTNESS OF BREATH *UNUSUAL BRUISING OR BLEEDING *URINARY PROBLEMS (pain or burning when urinating, or frequent urination) *BOWEL PROBLEMS (unusual diarrhea, constipation, pain near the anus) TENDERNESS IN MOUTH AND THROAT WITH OR WITHOUT PRESENCE OF ULCERS (sore throat, sores in mouth, or a toothache) UNUSUAL RASH, SWELLING OR PAIN  UNUSUAL VAGINAL DISCHARGE OR ITCHING   Items with * indicate a potential emergency and should be followed up as soon as possible or go to the Emergency Department if any problems should occur.  Please show the CHEMOTHERAPY ALERT CARD or IMMUNOTHERAPY  ALERT CARD at check-in to the Emergency Department and triage nurse.  Should you have questions after your visit or need to cancel or reschedule your appointment, please contact [facility]  Dept: [phone]  and follow the prompts.  Office hours are 8:00 a.m. to 4:30 p.m. Monday - Friday. Please note that voicemails left after 4:00 p.m. may not be returned until the following business day.  We are closed weekends and major holidays. You have access to a nurse at all times for urgent questions. Please call the main number to the clinic Dept: [phone] and follow the prompts.   For any non-urgent questions, you may also contact your provider using MyChart. We now offer e-Visits for anyone 37 and older to request care online for non-urgent symptoms. For details visit [email or web address].   Also download the MyChart app! Go to the app store, search "MyChart", open the app, select [facility], and log in with your MyChart username and password.  Masks are optional in the cancer centers. If you would like for your care team to wear a mask while they are taking care of you, please let them know. You may have one support person who is at least 57 years old accompany you for your appointments. Trastuzumab Injection What is this medication? TRASTUZUMAB (tras TOO zoo mab) treats breast cancer and stomach cancer. It works by blocking a protein that causes cancer cells to grow and multiply. This helps to slow or stop the spread of cancer cells. This medicine may be used for other purposes; ask your health care provider or pharmacist if you have questions. COMMON  BRAND NAME(S): Herceptin, Galvin Proffer, Trazimera What should I tell my care team before I take this medication? They need to know if you have any of these conditions: Heart failure Lung disease An unusual or allergic reaction to trastuzumab, other medications, foods, dyes, or  preservatives Pregnant or trying to get pregnant Breast-feeding How should I use this medication? This medication is injected into a vein. It is given by your care team in a hospital or clinic setting. Talk to your care team about the use of this medication in children. It is not approved for use in children. Overdosage: If you think you have taken too much of this medicine contact a poison control center or emergency room at once. NOTE: This medicine is only for you. Do not share this medicine with others. What if I miss a dose? Keep appointments for follow-up doses. It is important not to miss your dose. Call your care team if you are unable to keep an appointment. What may interact with this medication? Certain types of chemotherapy, such as daunorubicin, doxorubicin, epirubicin, idarubicin This list may not describe all possible interactions. Give your health care provider a list of all the medicines, herbs, non-prescription drugs, or dietary supplements you use. Also tell them if you smoke, drink alcohol, or use illegal drugs. Some items may interact with your medicine. What should I watch for while using this medication? Your condition will be monitored carefully while you are receiving this medication. This medication may make you feel generally unwell. This is not uncommon, as chemotherapy affects healthy cells as well as cancer cells. Report any side effects. Continue your course of treatment even though you feel ill unless your care team tells you to stop. This medication may increase your risk of getting an infection. Call your care team for advice if you get a fever, chills, sore throat, or other symptoms of a cold or flu. Do not treat yourself. Try to avoid being around people who are sick. Avoid taking medications that contain aspirin, acetaminophen, ibuprofen, naproxen, or ketoprofen unless instructed by your care team. These medications can hide a fever. Talk to your care team if  you may be pregnant. Serious birth defects can occur if you take this medication during pregnancy and for 7 months after the last dose. You will need a negative pregnancy test before starting this medication. Contraception is recommended while taking this medication and for 7 months after the last dose. Your care team can help you find the option that works for you. Do not breastfeed while taking this medication and for 7 months after stopping treatment. What side effects may I notice from receiving this medication? Side effects that you should report to your care team as soon as possible: Allergic reactions or angioedema--skin rash, itching or hives, swelling of the face, eyes, lips, tongue, arms, or legs, trouble swallowing or breathing Dry cough, shortness of breath or trouble breathing Heart failure--shortness of breath, swelling of the ankles, feet, or hands, sudden weight gain, unusual weakness or fatigue Infection--fever, chills, cough, or sore throat Infusion reactions--chest pain, shortness of breath or trouble breathing, feeling faint or lightheaded Side effects that usually do not require medical attention (report to your care team if they continue or are bothersome): Diarrhea Dizziness Headache Nausea Trouble sleeping Vomiting This list may not describe all possible side effects. Call your doctor for medical advice about side effects. You may report side effects to FDA at 1-800-FDA-1088. Where should I keep my medication?  This medication is given in a hospital or clinic. It will not be stored at home. NOTE: This sheet is a summary. It may not cover all possible information. If you have questions about this medicine, talk to your doctor, pharmacist, or health care provider.  2023 Elsevier/Gold Standard (2021-06-16 00:00:00)  Pertuzumab Injection What is this medication? PERTUZUMAB (per TOOZ ue mab) treats breast cancer. It works by blocking a protein that causes cancer cells to  grow and multiply. This helps to slow or stop the spread of cancer cells. It is a monoclonal antibody. This medicine may be used for other purposes; ask your health care provider or pharmacist if you have questions. COMMON BRAND NAME(S): PERJETA What should I tell my care team before I take this medication? They need to know if you have any of these conditions: Heart failure An unusual or allergic reaction to pertuzumab, other medications, foods, dyes, or preservatives Pregnant or trying to get pregnant Breast-feeding How should I use this medication? This medication is injected into a vein. It is given by your care team in a hospital or clinic setting. Talk to your care team about the use of this medication in children. Special care may be needed. Overdosage: If you think you have taken too much of this medicine contact a poison control center or emergency room at once. NOTE: This medicine is only for you. Do not share this medicine with others. What if I miss a dose? Keep appointments for follow-up doses. It is important not to miss your dose. Call your care team if you are unable to keep an appointment. What may interact with this medication? Interactions are not expected. This list may not describe all possible interactions. Give your health care provider a list of all the medicines, herbs, non-prescription drugs, or dietary supplements you use. Also tell them if you smoke, drink alcohol, or use illegal drugs. Some items may interact with your medicine. What should I watch for while using this medication? Your condition will be monitored carefully while you are receiving this medication. This medication may make you feel generally unwell. This is not uncommon as chemotherapy can affect healthy cells as well as cancer cells. Report any side effects. Continue your course of treatment even though you feel ill unless your care team tells you to stop. Talk to your care team if you may be  pregnant. Serious birth defects can occur if you take this medication during pregnancy and for 7 months after the last dose. You will need a negative pregnancy test before starting this medication. Contraception is recommended while taking this medication and for 7 months after the last dose. Your care team can help you find the option that works for you. Do not breastfeed while taking this medication and for 7 months after the last dose. What side effects may I notice from receiving this medication? Side effects that you should report to your care team as soon as possible: Allergic reactions or angioedema--skin rash, itching or hives, swelling of the face, eyes, lips, tongue, arms, or legs, trouble swallowing or breathing Heart failure--shortness of breath, swelling of the ankles, feet, or hands, sudden weight gain, unusual weakness or fatigue Infusion reactions--chest pain, shortness of breath or trouble breathing, feeling faint or lightheaded Side effects that usually do not require medical attention (report to your care team if they continue or are bothersome): Diarrhea Dry skin Fatigue Hair loss Nausea Vomiting This list may not describe all possible side effects. Call your doctor  for medical advice about side effects. You may report side effects to FDA at 1-800-FDA-1088. Where should I keep my medication? This medication is given in a hospital or clinic. It will not be stored at home. NOTE: This sheet is a summary. It may not cover all possible information. If you have questions about this medicine, talk to your doctor, pharmacist, or health care provider.  2023 Elsevier/Gold Standard (2021-06-04 00:00:00) 

## 2021-10-17 ENCOUNTER — Other Ambulatory Visit: Payer: Self-pay

## 2021-10-26 ENCOUNTER — Inpatient Hospital Stay: Payer: Commercial Managed Care - PPO | Attending: Hematology and Oncology

## 2021-10-26 ENCOUNTER — Other Ambulatory Visit: Payer: Self-pay

## 2021-10-26 VITALS — BP 119/79 | HR 56 | Temp 98.0°F | Resp 18 | Wt 178.5 lb

## 2021-10-26 DIAGNOSIS — C7951 Secondary malignant neoplasm of bone: Secondary | ICD-10-CM | POA: Insufficient documentation

## 2021-10-26 DIAGNOSIS — Z7189 Other specified counseling: Secondary | ICD-10-CM

## 2021-10-26 DIAGNOSIS — Z170 Estrogen receptor positive status [ER+]: Secondary | ICD-10-CM | POA: Diagnosis not present

## 2021-10-26 DIAGNOSIS — Z79899 Other long term (current) drug therapy: Secondary | ICD-10-CM | POA: Diagnosis not present

## 2021-10-26 DIAGNOSIS — M899 Disorder of bone, unspecified: Secondary | ICD-10-CM

## 2021-10-26 DIAGNOSIS — C50511 Malignant neoplasm of lower-outer quadrant of right female breast: Secondary | ICD-10-CM | POA: Insufficient documentation

## 2021-10-26 DIAGNOSIS — Z79811 Long term (current) use of aromatase inhibitors: Secondary | ICD-10-CM | POA: Insufficient documentation

## 2021-10-26 DIAGNOSIS — Z5111 Encounter for antineoplastic chemotherapy: Secondary | ICD-10-CM | POA: Diagnosis present

## 2021-10-26 DIAGNOSIS — Z5112 Encounter for antineoplastic immunotherapy: Secondary | ICD-10-CM | POA: Diagnosis present

## 2021-10-26 DIAGNOSIS — Z95828 Presence of other vascular implants and grafts: Secondary | ICD-10-CM

## 2021-10-26 LAB — CBC WITH DIFFERENTIAL (CANCER CENTER ONLY)
Abs Immature Granulocytes: 0.06 10*3/uL (ref 0.00–0.07)
Basophils Absolute: 0 10*3/uL (ref 0.0–0.1)
Basophils Relative: 1 %
Eosinophils Absolute: 0.2 10*3/uL (ref 0.0–0.5)
Eosinophils Relative: 2 %
HCT: 41 % (ref 36.0–46.0)
Hemoglobin: 13.6 g/dL (ref 12.0–15.0)
Immature Granulocytes: 1 %
Lymphocytes Relative: 33 %
Lymphs Abs: 2.6 10*3/uL (ref 0.7–4.0)
MCH: 31.4 pg (ref 26.0–34.0)
MCHC: 33.2 g/dL (ref 30.0–36.0)
MCV: 94.7 fL (ref 80.0–100.0)
Monocytes Absolute: 0.4 10*3/uL (ref 0.1–1.0)
Monocytes Relative: 5 %
Neutro Abs: 4.6 10*3/uL (ref 1.7–7.7)
Neutrophils Relative %: 58 %
Platelet Count: 233 10*3/uL (ref 150–400)
RBC: 4.33 MIL/uL (ref 3.87–5.11)
RDW: 12.9 % (ref 11.5–15.5)
WBC Count: 7.9 10*3/uL (ref 4.0–10.5)
nRBC: 0 % (ref 0.0–0.2)

## 2021-10-26 LAB — CMP (CANCER CENTER ONLY)
ALT: 31 U/L (ref 0–44)
AST: 19 U/L (ref 15–41)
Albumin: 4.2 g/dL (ref 3.5–5.0)
Alkaline Phosphatase: 65 U/L (ref 38–126)
Anion gap: 6 (ref 5–15)
BUN: 22 mg/dL — ABNORMAL HIGH (ref 6–20)
CO2: 29 mmol/L (ref 22–32)
Calcium: 9.6 mg/dL (ref 8.9–10.3)
Chloride: 107 mmol/L (ref 98–111)
Creatinine: 0.92 mg/dL (ref 0.44–1.00)
GFR, Estimated: >60 mL/min (ref >60)
Glucose, Bld: 113 mg/dL — ABNORMAL HIGH (ref 70–99)
Potassium: 3.9 mmol/L (ref 3.5–5.1)
Sodium: 142 mmol/L (ref 135–145)
Total Bilirubin: 0.4 mg/dL (ref 0.3–1.2)
Total Protein: 6.8 g/dL (ref 6.5–8.1)

## 2021-10-26 LAB — MAGNESIUM: Magnesium: 2.2 mg/dL (ref 1.7–2.4)

## 2021-10-26 MED ORDER — SODIUM CHLORIDE 0.9% FLUSH
10.0000 mL | INTRAVENOUS | Status: DC | PRN
  Administered 2021-10-26: 10 mL

## 2021-10-26 MED ORDER — DIPHENHYDRAMINE HCL 25 MG PO CAPS
25.0000 mg | ORAL_CAPSULE | Freq: Once | ORAL | Status: AC
  Administered 2021-10-26: 25 mg via ORAL
  Filled 2021-10-26: qty 1

## 2021-10-26 MED ORDER — HEPARIN SOD (PORK) LOCK FLUSH 100 UNIT/ML IV SOLN
500.0000 [IU] | Freq: Once | INTRAVENOUS | Status: AC | PRN
  Administered 2021-10-26: 500 [IU]

## 2021-10-26 MED ORDER — TRASTUZUMAB-ANNS CHEMO 150 MG IV SOLR
6.0000 mg/kg | Freq: Once | INTRAVENOUS | Status: AC
  Administered 2021-10-26: 462 mg via INTRAVENOUS
  Filled 2021-10-26: qty 22

## 2021-10-26 MED ORDER — ACETAMINOPHEN 325 MG PO TABS
650.0000 mg | ORAL_TABLET | Freq: Once | ORAL | Status: AC
  Administered 2021-10-26: 650 mg via ORAL
  Filled 2021-10-26: qty 2

## 2021-10-26 MED ORDER — ZOLEDRONIC ACID 4 MG/100ML IV SOLN
4.0000 mg | Freq: Once | INTRAVENOUS | Status: AC
  Administered 2021-10-26: 4 mg via INTRAVENOUS
  Filled 2021-10-26: qty 100

## 2021-10-26 MED ORDER — SODIUM CHLORIDE 0.9 % IV SOLN
420.0000 mg | Freq: Once | INTRAVENOUS | Status: AC
  Administered 2021-10-26: 420 mg via INTRAVENOUS
  Filled 2021-10-26: qty 14

## 2021-10-26 MED ORDER — SODIUM CHLORIDE 0.9 % IV SOLN: Freq: Once | INTRAVENOUS | Status: AC

## 2021-10-26 NOTE — Patient Instructions (Signed)
[facility]  Discharge Instructions: Thank you for choosing [facility] to provide your oncology and hematology care.   If you have a lab appointment with the Cancer Center, please go directly to the Cancer Center and check in at the registration area.   Wear comfortable clothing and clothing appropriate for easy access to any Portacath or PICC line.   We strive to give you quality time with your provider. You may need to reschedule your appointment if you arrive late (15 or more minutes).  Arriving late affects you and other patients whose appointments are after yours.  Also, if you miss three or more appointments without notifying the office, you may be dismissed from the clinic at the provider's discretion.      For prescription refill requests, have your pharmacy contact our office and allow 72 hours for refills to be completed.    Today you received the following chemotherapy and/or immunotherapy agents herceptin, perjeta      To help prevent nausea and vomiting after your treatment, we encourage you to take your nausea medication as directed.  BELOW ARE SYMPTOMS THAT SHOULD BE REPORTED IMMEDIATELY: *FEVER GREATER THAN 100.4 F (38 C) OR HIGHER *CHILLS OR SWEATING *NAUSEA AND VOMITING THAT IS NOT CONTROLLED WITH YOUR NAUSEA MEDICATION *UNUSUAL SHORTNESS OF BREATH *UNUSUAL BRUISING OR BLEEDING *URINARY PROBLEMS (pain or burning when urinating, or frequent urination) *BOWEL PROBLEMS (unusual diarrhea, constipation, pain near the anus) TENDERNESS IN MOUTH AND THROAT WITH OR WITHOUT PRESENCE OF ULCERS (sore throat, sores in mouth, or a toothache) UNUSUAL RASH, SWELLING OR PAIN  UNUSUAL VAGINAL DISCHARGE OR ITCHING   Items with * indicate a potential emergency and should be followed up as soon as possible or go to the Emergency Department if any problems should occur.  Please show the CHEMOTHERAPY ALERT CARD or IMMUNOTHERAPY ALERT CARD at  check-in to the Emergency Department and triage nurse.  Should you have questions after your visit or need to cancel or reschedule your appointment, please contact [facility]  Dept: [phone]  and follow the prompts.  Office hours are 8:00 a.m. to 4:30 p.m. Monday - Friday. Please note that voicemails left after 4:00 p.m. may not be returned until the following business day.  We are closed weekends and major holidays. You have access to a nurse at all times for urgent questions. Please call the main number to the clinic Dept: [phone] and follow the prompts.   For any non-urgent questions, you may also contact your provider using MyChart. We now offer e-Visits for anyone 18 and older to request care online for non-urgent symptoms. For details visit [email or web address].   Also download the MyChart app! Go to the app store, search "MyChart", open the app, select [facility], and log in with your MyChart username and password.  Masks are optional in the cancer centers. If you would like for your care team to wear a mask while they are taking care of you, please let them know. You may have one support person who is at least 57 years old accompany you for your appointments. 

## 2021-11-18 NOTE — Progress Notes (Signed)
Patient Care Team: [facility], [facility] as PCP - General [name], MD as Consulting Physician (Obstetrics and Gynecology) Jovita Kussmaul, MD as Consulting Physician (General Surgery) Gery Pray, MD as Consulting Physician (Radiation Oncology) Larey Dresser, MD as Consulting Physician (Cardiology) Nicholas Lose, MD as Medical Oncologist (Hematology and Oncology)  DIAGNOSIS:  Encounter Diagnosis  Name Primary?   Malignant neoplasm of lower-outer quadrant of right breast of female, estrogen receptor positive ([place]) Yes    SUMMARY OF ONCOLOGIC HISTORY: Oncology History  Malignant neoplasm of lower-outer quadrant of right breast of female, estrogen receptor positive ([place])  03/22/2016 Surgery   left lumpectomy 03/22/2016 showing atypical lobular hyperplasia prophylactic tamoxifen taken from 04/2016 through 03/2019   06/06/2019 Genetic Testing   Negative genetic testing:  No pathogenic variants detected on the [facility] panel, ordered by Dr. Helane Rima at [facility]. Two variants of uncertain significance (VUS) were detected - one in the BRIP1 gene called c.2863A>C and a second in the NBN gene called c.643C>T. The report date is 06/06/2019.  The [facility] gene panel offered by [employer] includes sequencing and deletion/duplication testing of the following 35 genes: APC, ATM, AXIN2, BARD1, BMPR1A, BRCA1, BRCA2, BRIP1, CDH1, CDK4, CDKN2A, CHEK2, EPCAM (large rearrangement only), HOXB13 (sequencing only), GALNT12, MLH1, MSH2, MSH3 (excluding repetitive portions of exon 1), MSH6, MUTYH, NBN, NTHL1, PALB2, PMS2, PTEN, RAD51C, RAD51D, RNF43, RPS20, SMAD4, STK11, and TP53. Sequencing was performed for select regions of POLE and POLD1, and large rearrangement analysis was performed for select regions of GREM1.     06/25/2020 Relapse/Recurrence   right breast lower outer quadrant biopsy 06/25/2020 shows a clinical T2 N1, stage IB invasive ductal  carcinoma, grade 2 or 3, triple positive, with an MIB-1 of 40%   07/02/2020 Cancer Staging   Staging form: Breast, AJCC 8th Edition - Clinical stage from 07/02/2020: Stage IV (cT2, cN1, cM1, G3, ER+, PR+, HER2+) - Signed by Chauncey Cruel, MD on 08/04/2020 Stage prefix: Initial diagnosis Histologic grading system: 3 grade system Laterality: Right Staged by: Pathologist and managing physician Stage used in treatment planning: Yes National guidelines used in treatment planning: Yes Type of national guideline used in treatment planning: NCCN   07/16/2020 - 09/30/2020 Chemotherapy   neoadjuvant chemotherapy consisting of docetaxel, carboplatin, trastuzumab and pertuzumab every 21 days x 4 cycles started 07/28/2020, last chemotherapy 09/30/2020       08/01/2020 PET scan   PET scan 08/01/2020 shows no hypermetabolism in the 1.2 cm right liver lesion; bony hypermetabolism consistent with known mets   08/19/2020 - 10/21/2020 Chemotherapy   Patient is on Treatment Plan : BREAST  Trastuzumab + Pertuzumab q21d      10/07/2020 Imaging    total spinal MRI 10/07/2020 shows stable to improved disease, no extraosseous extension, pathologic fracture, epidural or intracanalicular involvement MRI of the breast 10/19/2020 shows a complete imaging response in the breast, with new ring-enhancing lesions in the left breast with MRI guided biopsy 11/10/2020 showing no evidence of malignancy   11/11/2020 -  Chemotherapy   Patient is on Treatment Plan : BREAST Trastuzumab + Pertuzumab q21d     01/02/2021 Surgery   right lumpectomy with no sentinel lymph node sampling 01/02/2021 showed scattered foci of residual invasive ductal carcinoma, grade 2, the largest measuring 0.8 cm.  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 with above-mentioned history of metastatic breast cancer is currently on Herceptin and Perjeta maintenance. She presents to the clinic today for a follow-up. She states that the pain and aches was still there mainly in the knees. She reports that she had steroids in August and it helped. She still having loose stools. Twice a day and some days not at all. She is tolerating the letrozole.   ALLERGIES:  is allergic to ciprofloxacin, ciprofibrate, gabapentin, sulfa antibiotics, and venlafaxine.  MEDICATIONS:  Current Outpatient Medications  Medication Sig Dispense Refill   Biotin 5 MG CAPS Take 1 capsule (5 mg total) by mouth daily.  0   Boron 3 MG CAPS Take by mouth.     Calcium 500 MG tablet Take 600 mg by mouth.     chlorpheniramine-HYDROcodone 10-8 MG/5ML hydrocodone 10 mg-chlorpheniramine 8 mg/5 mL oral susp extend.rel 12hr  TAKE 5 ML BY MOUTH EVERY 12 HOURS AS NEEDED     fluconazole (DIFLUCAN) 150 MG tablet Take 1 tablet (150 mg total) by mouth daily. 2 tablet 0   ibuprofen (ADVIL) 800 MG tablet ibuprofen 800 mg tablet  TAKE 1 TABLET BY MOUTH THREE TIMES A DAY     ipratropium (ATROVENT) 0.06 % nasal spray Place into both nostrils.     letrozole (FEMARA) 2.5 MG tablet Take 1 tablet (2.5 mg total) by mouth daily. 90 tablet 3   levOCARNitine (CARNITINE PO) Take 1 capsule by mouth daily. L-Carnitine     lidocaine-prilocaine (EMLA) cream Apply topically daily.     loratadine (CLARITIN) 10 MG tablet Take 10 mg by mouth daily.     LORazepam (ATIVAN) 0.5 MG tablet lorazepam 0.5 mg tablet  TAKE 1 TABLET BY MOUTH EVERY 8 HOURS AS NEEDED MAY INCREASE TO 2 TABLETS AS NEEDED     LUTEIN PO Take by mouth.     MAGNESIUM PO Take 400 mg by mouth at bedtime.     Menatetrenone (VITAMIN K2) 100 MCG TABS Take by mouth.     Multiple  Vitamin (MULTIVITAMIN WITH MINERALS) TABS tablet Take 1 tablet by mouth daily.     OVER THE COUNTER MEDICATION Take 1 capsule by mouth daily. [place] Mushroom 1244m     tobramycin-dexamethasone (TOBRADEX) ophthalmic solution every 4 (four) hours while awake.     Ubiquinol 50 MG CAPS Take by mouth daily.     VITAMIN D PO 50 mcg.     zinc sulfate 220 (50 Zn) MG capsule Take 220 mg by mouth daily.     No current facility-administered medications for this visit.    PHYSICAL EXAMINATION: ECOG PERFORMANCE STATUS: 1 - Symptomatic but completely ambulatory  Vitals:   11/23/21 0844  BP: 123/69  Pulse: 73  Resp: 18  Temp: 97.7 F (36.5 C)  SpO2: 98%   Filed Weights   11/23/21 0844  Weight: 179 lb 4.8 oz (81.3 kg)      LABORATORY DATA:  I have reviewed the data as listed    Latest Ref Rng & Units 10/26/2021    8:47 AM 08/31/2021    8:44 AM 08/10/2021    8:28 AM  CMP  Glucose 70 - 99 mg/dL 113  94  119   BUN 6 - 20 mg/dL 22  17  17   Creatinine 0.44 - 1.00 mg/dL 0.92  0.79  0.86   Sodium 135 - 145 mmol/L 142  141  143   Potassium 3.5 - 5.1 mmol/L 3.9  3.9  4.0   Chloride 98 - 111 mmol/L 107  107  107   CO2 22 - 32 mmol/L _0 Calcium 8.9 - 10.3 mg/dL 9.6  9.9  9.7   Total Protein 6.5 - 8.1 g/dL 6.8  7.1  7.0   Total Bilirubin 0.3 - 1.2 mg/dL 0.4  0.4  0.4   Alkaline Phos 38 - 126 U/L 65  79  83   AST 15 - 41 U/L _1 ALT 0 - 44 U/L _2 Lab Results  Component Value Date   WBC 5.7 11/23/2021   HGB 13.4 11/23/2021   HCT 39.7 11/23/2021   MCV 93.0 11/23/2021   PLT 240 11/23/2021   NEUTROABS 3.3 11/23/2021    ASSESSMENT & PLAN:  Malignant neoplasm of lower-outer quadrant of right breast of female, estrogen receptor positive ([place]) Metastatic Breast cancer ER/PR and Her 2 Positive: S/p NAC with TCHP and BCS Current Treatment: HP maintenance with Anastrozole and Xgeva switched to letrozole 06/08/2021, Perjeta discontinued from 11/23/2021    Toxicities:  1.  Joint pains and stiffness:on letrozole 2. hot flashes 3. Diarrhea from Perjeta: Manageable, we discontinued Perjeta.   08/15/2021: CT CAP: No findings of recurrence of breast cancer in the chest.  Stable sclerotic bone lesions consistent with treated metastatic disease.  No abnormalities in the liver.    Continue with Herceptin maintenance monthly along with letrozole We will also move her Zometa to every 6 months.  Return to clinic in 3 months after CT scans      Orders Placed This Encounter  Procedures   CT CHEST ABDOMEN PELVIS W CONTRAST    Standing Status:   Future    Standing Expiration Date:   11/24/2022    Order Specific Question:   Is patient pregnant?    Answer:   No    Order Specific Question:   Preferred imaging location?    Answer:   [facility]    Order Specific Question:   Is Oral Contrast requested for this exam?    Answer:   Yes, Per Radiology protocol   Magnesium    Standing Status:   Standing    Number of Occurrences:   20    Standing Expiration Date:   11/24/2022   The patient has a good understanding of the overall plan. she agrees with it. she will call with any problems that may develop before the next visit here. Total time spent: 30 mins including face to face time and time spent for planning, charting and co-ordination of care   Harriette Ohara, MD 11/23/21    I Gardiner Coins am scribing for Dr. Lindi Adie  I have reviewed the above documentation for accuracy and completeness, and I agree with the above.

## 2021-11-23 ENCOUNTER — Inpatient Hospital Stay: Payer: Commercial Managed Care - PPO

## 2021-11-23 ENCOUNTER — Inpatient Hospital Stay (HOSPITAL_BASED_OUTPATIENT_CLINIC_OR_DEPARTMENT_OTHER): Payer: Commercial Managed Care - PPO | Admitting: Hematology and Oncology

## 2021-11-23 ENCOUNTER — Inpatient Hospital Stay: Payer: Commercial Managed Care - PPO | Attending: Hematology and Oncology

## 2021-11-23 ENCOUNTER — Other Ambulatory Visit: Payer: Self-pay

## 2021-11-23 VITALS — BP 123/69 | HR 73 | Temp 97.7°F | Resp 18 | Ht 67.0 in | Wt 179.3 lb

## 2021-11-23 DIAGNOSIS — M899 Disorder of bone, unspecified: Secondary | ICD-10-CM

## 2021-11-23 DIAGNOSIS — C7951 Secondary malignant neoplasm of bone: Secondary | ICD-10-CM | POA: Insufficient documentation

## 2021-11-23 DIAGNOSIS — Z5112 Encounter for antineoplastic immunotherapy: Secondary | ICD-10-CM | POA: Diagnosis present

## 2021-11-23 DIAGNOSIS — Z170 Estrogen receptor positive status [ER+]: Secondary | ICD-10-CM

## 2021-11-23 DIAGNOSIS — Z79899 Other long term (current) drug therapy: Secondary | ICD-10-CM | POA: Diagnosis not present

## 2021-11-23 DIAGNOSIS — C50511 Malignant neoplasm of lower-outer quadrant of right female breast: Secondary | ICD-10-CM | POA: Insufficient documentation

## 2021-11-23 DIAGNOSIS — Z79811 Long term (current) use of aromatase inhibitors: Secondary | ICD-10-CM | POA: Insufficient documentation

## 2021-11-23 DIAGNOSIS — Z95828 Presence of other vascular implants and grafts: Secondary | ICD-10-CM

## 2021-11-23 DIAGNOSIS — Z7189 Other specified counseling: Secondary | ICD-10-CM

## 2021-11-23 LAB — CBC WITH DIFFERENTIAL (CANCER CENTER ONLY)
Abs Immature Granulocytes: 0.03 10*3/uL (ref 0.00–0.07)
Basophils Absolute: 0 10*3/uL (ref 0.0–0.1)
Basophils Relative: 1 %
Eosinophils Absolute: 0.1 10*3/uL (ref 0.0–0.5)
Eosinophils Relative: 2 %
HCT: 39.7 % (ref 36.0–46.0)
Hemoglobin: 13.4 g/dL (ref 12.0–15.0)
Immature Granulocytes: 1 %
Lymphocytes Relative: 33 %
Lymphs Abs: 1.9 10*3/uL (ref 0.7–4.0)
MCH: 31.4 pg (ref 26.0–34.0)
MCHC: 33.8 g/dL (ref 30.0–36.0)
MCV: 93 fL (ref 80.0–100.0)
Monocytes Absolute: 0.3 10*3/uL (ref 0.1–1.0)
Monocytes Relative: 5 %
Neutro Abs: 3.3 10*3/uL (ref 1.7–7.7)
Neutrophils Relative %: 58 %
Platelet Count: 240 10*3/uL (ref 150–400)
RBC: 4.27 MIL/uL (ref 3.87–5.11)
RDW: 12.6 % (ref 11.5–15.5)
WBC Count: 5.7 10*3/uL (ref 4.0–10.5)
nRBC: 0 % (ref 0.0–0.2)

## 2021-11-23 LAB — CMP (CANCER CENTER ONLY)
ALT: 22 U/L (ref 0–44)
AST: 17 U/L (ref 15–41)
Albumin: 4.3 g/dL (ref 3.5–5.0)
Alkaline Phosphatase: 88 U/L (ref 38–126)
Anion gap: 6 (ref 5–15)
BUN: 17 mg/dL (ref 6–20)
CO2: 28 mmol/L (ref 22–32)
Calcium: 9.5 mg/dL (ref 8.9–10.3)
Chloride: 108 mmol/L (ref 98–111)
Creatinine: 0.8 mg/dL (ref 0.44–1.00)
GFR, Estimated: >60 mL/min (ref >60)
Glucose, Bld: 121 mg/dL — ABNORMAL HIGH (ref 70–99)
Potassium: 3.5 mmol/L (ref 3.5–5.1)
Sodium: 142 mmol/L (ref 135–145)
Total Bilirubin: 0.3 mg/dL (ref 0.3–1.2)
Total Protein: 7.2 g/dL (ref 6.5–8.1)

## 2021-11-23 MED ORDER — SODIUM CHLORIDE 0.9% FLUSH
10.0000 mL | Freq: Once | INTRAVENOUS | Status: AC
  Administered 2021-11-23: 10 mL

## 2021-11-23 MED ORDER — SODIUM CHLORIDE 0.9% FLUSH
10.0000 mL | INTRAVENOUS | Status: DC | PRN
  Administered 2021-11-23: 10 mL

## 2021-11-23 MED ORDER — TRASTUZUMAB-ANNS CHEMO 150 MG IV SOLR
6.0000 mg/kg | Freq: Once | INTRAVENOUS | Status: AC
  Administered 2021-11-23: 462 mg via INTRAVENOUS
  Filled 2021-11-23: qty 22

## 2021-11-23 MED ORDER — ACETAMINOPHEN 325 MG PO TABS
650.0000 mg | ORAL_TABLET | Freq: Once | ORAL | Status: AC
  Administered 2021-11-23: 650 mg via ORAL
  Filled 2021-11-23: qty 2

## 2021-11-23 MED ORDER — SODIUM CHLORIDE 0.9 % IV SOLN: Freq: Once | INTRAVENOUS | Status: AC

## 2021-11-23 MED ORDER — HEPARIN SOD (PORK) LOCK FLUSH 100 UNIT/ML IV SOLN
500.0000 [IU] | Freq: Once | INTRAVENOUS | Status: AC | PRN
  Administered 2021-11-23: 500 [IU]

## 2021-11-23 NOTE — Patient Instructions (Signed)
[facility]  Discharge Instructions: Thank you for choosing [facility] to provide your oncology and hematology care.   If you have a lab appointment with the Cancer Center, please go directly to the Cancer Center and check in at the registration area.   Wear comfortable clothing and clothing appropriate for easy access to any Portacath or PICC line.   We strive to give you quality time with your provider. You may need to reschedule your appointment if you arrive late (15 or more minutes).  Arriving late affects you and other patients whose appointments are after yours.  Also, if you miss three or more appointments without notifying the office, you may be dismissed from the clinic at the provider's discretion.      For prescription refill requests, have your pharmacy contact our office and allow 72 hours for refills to be completed.    Today you received the following chemotherapy and/or immunotherapy agents: trastuzumab-anns      To help prevent nausea and vomiting after your treatment, we encourage you to take your nausea medication as directed.  BELOW ARE SYMPTOMS THAT SHOULD BE REPORTED IMMEDIATELY: *FEVER GREATER THAN 100.4 F (38 C) OR HIGHER *CHILLS OR SWEATING *NAUSEA AND VOMITING THAT IS NOT CONTROLLED WITH YOUR NAUSEA MEDICATION *UNUSUAL SHORTNESS OF BREATH *UNUSUAL BRUISING OR BLEEDING *URINARY PROBLEMS (pain or burning when urinating, or frequent urination) *BOWEL PROBLEMS (unusual diarrhea, constipation, pain near the anus) TENDERNESS IN MOUTH AND THROAT WITH OR WITHOUT PRESENCE OF ULCERS (sore throat, sores in mouth, or a toothache) UNUSUAL RASH, SWELLING OR PAIN  UNUSUAL VAGINAL DISCHARGE OR ITCHING   Items with * indicate a potential emergency and should be followed up as soon as possible or go to the Emergency Department if any problems should occur.  Please show the CHEMOTHERAPY ALERT CARD or IMMUNOTHERAPY ALERT CARD at  check-in to the Emergency Department and triage nurse.  Should you have questions after your visit or need to cancel or reschedule your appointment, please contact [facility]  Dept: [phone]  and follow the prompts.  Office hours are 8:00 a.m. to 4:30 p.m. Monday - Friday. Please note that voicemails left after 4:00 p.m. may not be returned until the following business day.  We are closed weekends and major holidays. You have access to a nurse at all times for urgent questions. Please call the main number to the clinic Dept: [phone] and follow the prompts.   For any non-urgent questions, you may also contact your provider using MyChart. We now offer e-Visits for anyone 18 and older to request care online for non-urgent symptoms. For details visit [email or web address].   Also download the MyChart app! Go to the app store, search "MyChart", open the app, select [facility], and log in with your MyChart username and password.  Masks are optional in the cancer centers. If you would like for your care team to wear a mask while they are taking care of you, please let them know. You may have one support person who is at least 57 years old accompany you for your appointments. 

## 2021-11-23 NOTE — Assessment & Plan Note (Addendum)
Metastatic Breast cancer ER/PR and Her 2 Positive: S/p NAC with TCHP and BCS Current Treatment:HP maintenance with Anastrozole and Xgevaswitched to letrozole 06/08/2021, Perjeta discontinued from 11/23/2021  Toxicities: 1.Joint pains and stiffness:on letrozole 2.hot flashes 3. Diarrhea from Perjeta: Manageable, we discontinued Perjeta.  08/15/2021: CT CAP: No findings of recurrence of breast cancer in the chest.  Stable sclerotic bone lesions consistent with treated metastatic disease.  No abnormalities in the liver.   Continue with Herceptin maintenance monthly along with letrozole We will also move her Zometa to every 6 months.  Return to clinic in 3 months after CT scans

## 2021-11-24 ENCOUNTER — Other Ambulatory Visit: Payer: Self-pay

## 2021-11-25 ENCOUNTER — Other Ambulatory Visit: Payer: Self-pay

## 2021-11-27 ENCOUNTER — Other Ambulatory Visit: Payer: Self-pay

## 2021-12-16 ENCOUNTER — Encounter: Payer: Self-pay | Admitting: Hematology and Oncology

## 2021-12-17 ENCOUNTER — Encounter: Payer: Self-pay | Admitting: Hematology and Oncology

## 2021-12-21 ENCOUNTER — Other Ambulatory Visit: Payer: Self-pay

## 2021-12-21 ENCOUNTER — Inpatient Hospital Stay: Payer: Commercial Managed Care - PPO | Attending: Hematology and Oncology

## 2021-12-21 VITALS — BP 117/69 | HR 72 | Temp 97.7°F | Resp 16 | Wt 179.8 lb

## 2021-12-21 DIAGNOSIS — C7951 Secondary malignant neoplasm of bone: Secondary | ICD-10-CM | POA: Insufficient documentation

## 2021-12-21 DIAGNOSIS — Z79899 Other long term (current) drug therapy: Secondary | ICD-10-CM | POA: Diagnosis not present

## 2021-12-21 DIAGNOSIS — C50511 Malignant neoplasm of lower-outer quadrant of right female breast: Secondary | ICD-10-CM | POA: Diagnosis not present

## 2021-12-21 DIAGNOSIS — Z5112 Encounter for antineoplastic immunotherapy: Secondary | ICD-10-CM | POA: Insufficient documentation

## 2021-12-21 DIAGNOSIS — Z170 Estrogen receptor positive status [ER+]: Secondary | ICD-10-CM | POA: Diagnosis not present

## 2021-12-21 MED ORDER — ACETAMINOPHEN 325 MG PO TABS
650.0000 mg | ORAL_TABLET | Freq: Once | ORAL | Status: AC
  Administered 2021-12-21: 650 mg via ORAL
  Filled 2021-12-21: qty 2

## 2021-12-21 MED ORDER — SODIUM CHLORIDE 0.9% FLUSH
10.0000 mL | INTRAVENOUS | Status: DC | PRN
  Administered 2021-12-21: 10 mL

## 2021-12-21 MED ORDER — TRASTUZUMAB-ANNS CHEMO 150 MG IV SOLR
6.0000 mg/kg | Freq: Once | INTRAVENOUS | Status: AC
  Administered 2021-12-21: 462 mg via INTRAVENOUS
  Filled 2021-12-21: qty 22

## 2021-12-21 MED ORDER — SODIUM CHLORIDE 0.9 % IV SOLN: Freq: Once | INTRAVENOUS | Status: AC

## 2021-12-21 MED ORDER — HEPARIN SOD (PORK) LOCK FLUSH 100 UNIT/ML IV SOLN
500.0000 [IU] | Freq: Once | INTRAVENOUS | Status: AC | PRN
  Administered 2021-12-21: 500 [IU]

## 2021-12-21 NOTE — Progress Notes (Signed)
Per Dr Lindi Adie, ok to proceed with Echo results from June 2023 60-65%

## 2021-12-21 NOTE — Patient Instructions (Signed)
[facility]  Discharge Instructions: Thank you for choosing [facility] to provide your oncology and hematology care.   If you have a lab appointment with the [facility], please go directly to the [facility] and check in at the registration area.   Wear comfortable clothing and clothing appropriate for easy access to any Portacath or PICC line.   We strive to give you quality time with your provider. You may need to reschedule your appointment if you arrive late (15 or more minutes).  Arriving late affects you and other patients whose appointments are after yours.  Also, if you miss three or more appointments without notifying the office, you may be dismissed from the clinic at the provider's discretion.      For prescription refill requests, have your pharmacy contact our office and allow 72 hours for refills to be completed.    Today you received the following chemotherapy and/or immunotherapy agents: Kanjinti     To help prevent nausea and vomiting after your treatment, we encourage you to take your nausea medication as directed.  BELOW ARE SYMPTOMS THAT SHOULD BE REPORTED IMMEDIATELY: *FEVER GREATER THAN 100.4 F (38 C) OR HIGHER *CHILLS OR SWEATING *NAUSEA AND VOMITING THAT IS NOT CONTROLLED WITH YOUR NAUSEA MEDICATION *UNUSUAL SHORTNESS OF BREATH *UNUSUAL BRUISING OR BLEEDING *URINARY PROBLEMS (pain or burning when urinating, or frequent urination) *BOWEL PROBLEMS (unusual diarrhea, constipation, pain near the anus) TENDERNESS IN MOUTH AND THROAT WITH OR WITHOUT PRESENCE OF ULCERS (sore throat, sores in mouth, or a toothache) UNUSUAL RASH, SWELLING OR PAIN  UNUSUAL VAGINAL DISCHARGE OR ITCHING   Items with * indicate a potential emergency and should be followed up as soon as possible or go to the Emergency Department if any problems should occur.  Please show the CHEMOTHERAPY ALERT CARD or IMMUNOTHERAPY ALERT CARD at check-in to  the Emergency Department and triage nurse.  Should you have questions after your visit or need to cancel or reschedule your appointment, please contact [facility]  Dept: [phone]  and follow the prompts.  Office hours are 8:00 a.m. to 4:30 p.m. Monday - Friday. Please note that voicemails left after 4:00 p.m. may not be returned until the following business day.  We are closed weekends and major holidays. You have access to a nurse at all times for urgent questions. Please call the main number to the clinic Dept: [phone] and follow the prompts.   For any non-urgent questions, you may also contact your provider using MyChart. We now offer e-Visits for anyone 80 and older to request care online for non-urgent symptoms. For details visit [email or web address].   Also download the MyChart app! Go to the app store, search "MyChart", open the app, select [facility], and log in with your MyChart username and password.  Masks are optional in the cancer centers. If you would like for your care team to wear a mask while they are taking care of you, please let them know. You may have one support person who is at least 57 years old accompany you for your appointments.

## 2021-12-30 ENCOUNTER — Other Ambulatory Visit: Payer: Self-pay | Admitting: *Deleted

## 2021-12-30 MED ORDER — AMOXICILLIN 500 MG PO CAPS: 500.0000 mg | ORAL_CAPSULE | Freq: Two times a day (BID) | ORAL | 0 refills | Status: DC

## 2021-12-30 NOTE — Progress Notes (Signed)
Received call from pt with complaint of low grade fever, headache, chills, and thick green mucus production when she coughs.  Pt states she tested for covid this morning and it was slightly positive.  Per MD pt needing Amoxicillin 500 mg BID x7 days.  Pt also educated on OTC Mucinex DM, Tylenol q 4-6 hrs.  Prescription sent to pharmacy on file, pt educated to contact our office if symptoms do not improve by Friday.  Pt verbalized understanding.

## 2022-01-04 ENCOUNTER — Other Ambulatory Visit: Payer: Self-pay | Admitting: *Deleted

## 2022-01-04 MED ORDER — BENZONATATE 200 MG PO CAPS: 200.0000 mg | ORAL_CAPSULE | Freq: Three times a day (TID) | ORAL | 0 refills | Status: DC | PRN

## 2022-01-04 NOTE — Progress Notes (Signed)
Received call from pt with complaint of ongoing cough.  Pt tested positive for Covid 12/30/21 and states she is feeling better besides a lingering cough.  Verbal orders received from MD for pt to be prescribed Tessalon Pearls 200 mg TID PRN.  Prescription sent to pharmacy on file, pt educated and verbalized understanding.

## 2022-01-15 ENCOUNTER — Other Ambulatory Visit: Payer: Self-pay | Admitting: *Deleted

## 2022-01-15 DIAGNOSIS — S90219A Contusion of unspecified great toe with damage to nail, initial encounter: Secondary | ICD-10-CM

## 2022-01-15 DIAGNOSIS — Z5181 Encounter for therapeutic drug level monitoring: Secondary | ICD-10-CM

## 2022-01-15 NOTE — Progress Notes (Signed)
Received call from pt requesting referral to Podiatrist for evaluation of right great toe nail.  Pt states during chemotherapy, toenail became black and is now starting to separate from nail bed.  Pt denies s/s of infection at this time. Per MD request referral placed to [facility].  Pt educated and verbalized understanding.

## 2022-01-15 NOTE — Progress Notes (Signed)
Per MD okay for pt to proceed with tx 01/18/22 with last echo 08/03/21.  Orders placed to obtain new echo in 1 week.

## 2022-01-16 ENCOUNTER — Other Ambulatory Visit: Payer: Self-pay

## 2022-01-18 ENCOUNTER — Other Ambulatory Visit: Payer: Self-pay

## 2022-01-18 ENCOUNTER — Inpatient Hospital Stay: Payer: Commercial Managed Care - PPO | Attending: Hematology and Oncology

## 2022-01-18 VITALS — BP 122/67 | HR 66 | Temp 98.4°F | Resp 16 | Wt 180.8 lb

## 2022-01-18 DIAGNOSIS — Z5112 Encounter for antineoplastic immunotherapy: Secondary | ICD-10-CM | POA: Diagnosis present

## 2022-01-18 DIAGNOSIS — C50512 Malignant neoplasm of lower-outer quadrant of left female breast: Secondary | ICD-10-CM | POA: Diagnosis not present

## 2022-01-18 DIAGNOSIS — C50511 Malignant neoplasm of lower-outer quadrant of right female breast: Secondary | ICD-10-CM

## 2022-01-18 DIAGNOSIS — C7951 Secondary malignant neoplasm of bone: Secondary | ICD-10-CM

## 2022-01-18 DIAGNOSIS — Z170 Estrogen receptor positive status [ER+]: Secondary | ICD-10-CM | POA: Insufficient documentation

## 2022-01-18 DIAGNOSIS — Z79811 Long term (current) use of aromatase inhibitors: Secondary | ICD-10-CM | POA: Insufficient documentation

## 2022-01-18 MED ORDER — TRASTUZUMAB-ANNS CHEMO 150 MG IV SOLR
6.0000 mg/kg | Freq: Once | INTRAVENOUS | Status: AC
  Administered 2022-01-18: 462 mg via INTRAVENOUS
  Filled 2022-01-18: qty 22

## 2022-01-18 MED ORDER — SODIUM CHLORIDE 0.9% FLUSH
10.0000 mL | INTRAVENOUS | Status: DC | PRN
  Administered 2022-01-18: 10 mL

## 2022-01-18 MED ORDER — ACETAMINOPHEN 325 MG PO TABS: 650.0000 mg | ORAL_TABLET | Freq: Once | ORAL | Status: DC

## 2022-01-18 MED ORDER — HEPARIN SOD (PORK) LOCK FLUSH 100 UNIT/ML IV SOLN
500.0000 [IU] | Freq: Once | INTRAVENOUS | Status: AC | PRN
  Administered 2022-01-18: 500 [IU]

## 2022-01-18 MED ORDER — SODIUM CHLORIDE 0.9 % IV SOLN: Freq: Once | INTRAVENOUS | Status: AC

## 2022-01-18 NOTE — Patient Instructions (Signed)
[facility]COL[facility]  Discharge Instructions: Thank you for choosing [facility] to provide your oncology and hematology care.   If you have a lab appointment with the [facility], please go directly to the [facility] and check in at the registration area.   Wear comfortable clothing and clothing appropriate for easy access to any Portacath or PICC line.   We strive to give you quality time with your provider. You may need to reschedule your appointment if you arrive late (15 or more minutes).  Arriving late affects you and other patients whose appointments are after yours.  Also, if you miss three or more appointments without notifying the office, you may be dismissed from the clinic at the provider's discretion.      For prescription refill requests, have your pharmacy contact our office and allow 72 hours for refills to be completed.    Today you received the following chemotherapy and/or immunotherapy agent: Trastuzumab (Kanjinti)   To help prevent nausea and vomiting after your treatment, we encourage you to take your nausea medication as directed.  BELOW ARE SYMPTOMS THAT SHOULD BE REPORTED IMMEDIATELY: *FEVER GREATER THAN 100.4 F (38 C) OR HIGHER *CHILLS OR SWEATING *NAUSEA AND VOMITING THAT IS NOT CONTROLLED WITH YOUR NAUSEA MEDICATION *UNUSUAL SHORTNESS OF BREATH *UNUSUAL BRUISING OR BLEEDING *URINARY PROBLEMS (pain or burning when urinating, or frequent urination) *BOWEL PROBLEMS (unusual diarrhea, constipation, pain near the anus) TENDERNESS IN MOUTH AND THROAT WITH OR WITHOUT PRESENCE OF ULCERS (sore throat, sores in mouth, or a toothache) UNUSUAL RASH, SWELLING OR PAIN  UNUSUAL VAGINAL DISCHARGE OR ITCHING   Items with * indicate a potential emergency and should be followed up as soon as possible or go to the Emergency Department if any problems should occur.  Please show the CHEMOTHERAPY ALERT CARD or IMMUNOTHERAPY ALERT CARD at  check-in to the Emergency Department and triage nurse.  Should you have questions after your visit or need to cancel or reschedule your appointment, please contact [facility]  Dept: [phone]  and follow the prompts.  Office hours are 8:00 a.m. to 4:30 p.m. Monday - Friday. Please note that voicemails left after 4:00 p.m. may not be returned until the following business day.  We are closed weekends and major holidays. You have access to a nurse at all times for urgent questions. Please call the main number to the clinic Dept: [phone] and follow the prompts.   For any non-urgent questions, you may also contact your provider using MyChart. We now offer e-Visits for anyone 42 and older to request care online for non-urgent symptoms. For details visit [email or web address].   Also download the MyChart app! Go to the app store, search "MyChart", open the app, select , and log in with your MyChart username and password.  Masks are optional in the cancer centers. If you would like for your care team to wear a mask while they are taking care of you, please let them know. You may have one support person who is at least 57 years old accompany you for your appointments. Trastuzumab Injection What is this medication? TRASTUZUMAB (tras TOO zoo mab) treats breast cancer and stomach cancer. It works by blocking a protein that causes cancer cells to grow and multiply. This helps to slow or stop the spread of cancer cells. This medicine may be used for other purposes; ask your health care provider or pharmacist if you have questions. COMMON BRAND NAME(S):  Herceptin, Donnald Garre What should I tell my care team before I take this medication? They need to know if you have any of these conditions: Heart failure Lung disease An unusual or allergic reaction to trastuzumab, other medications, foods, dyes, or preservatives Pregnant or  trying to get pregnant Breast-feeding How should I use this medication? This medication is injected into a vein. It is given by your care team in a hospital or clinic setting. Talk to your care team about the use of this medication in children. It is not approved for use in children. Overdosage: If you think you have taken too much of this medicine contact a poison control center or emergency room at once. NOTE: This medicine is only for you. Do not share this medicine with others. What if I miss a dose? Keep appointments for follow-up doses. It is important not to miss your dose. Call your care team if you are unable to keep an appointment. What may interact with this medication? Certain types of chemotherapy, such as daunorubicin, doxorubicin, epirubicin, idarubicin This list may not describe all possible interactions. Give your health care provider a list of all the medicines, herbs, non-prescription drugs, or dietary supplements you use. Also tell them if you smoke, drink alcohol, or use illegal drugs. Some items may interact with your medicine. What should I watch for while using this medication? Your condition will be monitored carefully while you are receiving this medication. This medication may make you feel generally unwell. This is not uncommon, as chemotherapy affects healthy cells as well as cancer cells. Report any side effects. Continue your course of treatment even though you feel ill unless your care team tells you to stop. This medication may increase your risk of getting an infection. Call your care team for advice if you get a fever, chills, sore throat, or other symptoms of a cold or flu. Do not treat yourself. Try to avoid being around people who are sick. Avoid taking medications that contain aspirin, acetaminophen, ibuprofen, naproxen, or ketoprofen unless instructed by your care team. These medications can hide a fever. Talk to your care team if you may be pregnant. Serious  birth defects can occur if you take this medication during pregnancy and for 7 months after the last dose. You will need a negative pregnancy test before starting this medication. Contraception is recommended while taking this medication and for 7 months after the last dose. Your care team can help you find the option that works for you. Do not breastfeed while taking this medication and for 7 months after stopping treatment. What side effects may I notice from receiving this medication? Side effects that you should report to your care team as soon as possible: Allergic reactions or angioedema--skin rash, itching or hives, swelling of the face, eyes, lips, tongue, arms, or legs, trouble swallowing or breathing Dry cough, shortness of breath or trouble breathing Heart failure--shortness of breath, swelling of the ankles, feet, or hands, sudden weight gain, unusual weakness or fatigue Infection--fever, chills, cough, or sore throat Infusion reactions--chest pain, shortness of breath or trouble breathing, feeling faint or lightheaded Side effects that usually do not require medical attention (report to your care team if they continue or are bothersome): Diarrhea Dizziness Headache Nausea Trouble sleeping Vomiting This list may not describe all possible side effects. Call your doctor for medical advice about side effects. You may report side effects to FDA at 1-800-FDA-1088. Where should I keep my medication? This medication  is given in a hospital or clinic. It will not be stored at home. NOTE: This sheet is a summary. It may not cover all possible information. If you have questions about this medicine, talk to your doctor, pharmacist, or health care provider.  2023 Elsevier/Gold Standard (2021-06-04 00:00:00)

## 2022-01-18 NOTE — Progress Notes (Signed)
Per Dr. Lindi Adie, ok for treatment today with last ECHO 08/03/21.

## 2022-01-19 ENCOUNTER — Encounter: Payer: Self-pay | Admitting: Hematology and Oncology

## 2022-01-20 ENCOUNTER — Ambulatory Visit (HOSPITAL_COMMUNITY)
Admission: RE | Admit: 2022-01-20 | Discharge: 2022-01-20 | Disposition: A | Discharge status: Home / Self Care | Payer: Commercial Managed Care - PPO | Source: Ambulatory Visit | Attending: Hematology and Oncology | Admitting: Hematology and Oncology

## 2022-01-20 DIAGNOSIS — Z79899 Other long term (current) drug therapy: Secondary | ICD-10-CM | POA: Diagnosis not present

## 2022-01-20 DIAGNOSIS — Z5181 Encounter for therapeutic drug level monitoring: Secondary | ICD-10-CM | POA: Insufficient documentation

## 2022-01-20 DIAGNOSIS — Z0189 Encounter for other specified special examinations: Secondary | ICD-10-CM

## 2022-01-20 DIAGNOSIS — C50919 Malignant neoplasm of unspecified site of unspecified female breast: Secondary | ICD-10-CM | POA: Diagnosis not present

## 2022-01-20 DIAGNOSIS — I517 Cardiomegaly: Secondary | ICD-10-CM | POA: Diagnosis not present

## 2022-01-20 LAB — ECHOCARDIOGRAM COMPLETE
AR max vel: 2.44 cm2
AV Area VTI: 2.47 cm2
AV Area mean vel: 2.64 cm2
AV Mean grad: 3 mmHg
AV Peak grad: 5.4 mmHg
Ao pk vel: 1.16 m/s
Area-P 1/2: 4.04 cm2
Calc EF: 60.1 %
MV VTI: 2.22 cm2
S' Lateral: 3.1 cm
Single Plane A2C EF: 67.4 %
Single Plane A4C EF: 55.6 %

## 2022-01-20 NOTE — Progress Notes (Incomplete)
*  PRELIMINARY RESULTS* Echocardiogram 2D Echocardiogram has been performed.  Kim Archer 01/20/2022, 9:02 AM

## 2022-01-28 ENCOUNTER — Encounter: Payer: Self-pay | Admitting: Podiatry

## 2022-01-28 ENCOUNTER — Ambulatory Visit (INDEPENDENT_AMBULATORY_CARE_PROVIDER_SITE_OTHER): Payer: Commercial Managed Care - PPO | Admitting: Podiatry

## 2022-01-28 DIAGNOSIS — B351 Tinea unguium: Secondary | ICD-10-CM

## 2022-01-28 MED ORDER — TERBINAFINE HCL 250 MG PO TABS: ORAL_TABLET | ORAL | 0 refills | Status: DC

## 2022-01-29 ENCOUNTER — Other Ambulatory Visit: Payer: Self-pay

## 2022-01-29 NOTE — Progress Notes (Signed)
Subjective:   Patient ID: Kim Archer, female   DOB: 56 y.o.   MRN: [id number]   HPI Patient presents stating that she is developed problems with the right hallux nail its become discolored and it is then and she did have chemo that ended late last year.  States she is doing well with that but she did develop mild neuropathic symptoms also.  She does not smoke and likes to be active   Review of Systems  All other systems reviewed and are negative.       Objective:  Physical Exam Vitals and nursing note reviewed.  Constitutional:      Appearance: She is well-developed.  Pulmonary:     Effort: Pulmonary effort is normal.  Musculoskeletal:        General: Normal range of motion.  Skin:    General: Skin is warm.  Neurological:     Mental Status: She is alert.     Neurovascular status shows diminishment of sharp dull vibratory bilateral mild in nature with patient found to have a very thin second nailbed right discoloration of the entire nail no pain when I palpated to the area.  Patient is found to have good digital perfusion well-oriented x 3     Assessment:  Traumatized right hallux nail with chemotherapy probably creating a thinness of the nailbed and this condition with discoloration of the entire nailbed itself     Plan:  H&P reviewed condition and at this point I discussed the treatment options and have recommended antifungal therapy of a pulse nature along with laser.  I do think 3-4 laser is will hopefully take care of the problem and I educated her on this she wants to follow this path understanding no guarantees this will improve the nail and it is possible could have to be removed in the future.  All questions answered today

## 2022-02-11 ENCOUNTER — Other Ambulatory Visit: Payer: Commercial Managed Care - PPO

## 2022-02-11 NOTE — Progress Notes (Signed)
Patient Care Team: [facility], [facility] as PCP - General [name], MD as Consulting Physician (Obstetrics and Gynecology) Jovita Kussmaul, MD as Consulting Physician (General Surgery) Gery Pray, MD as Consulting Physician (Radiation Oncology) Larey Dresser, MD as Consulting Physician (Cardiology) Nicholas Lose, MD as Medical Oncologist (Hematology and Oncology)  DIAGNOSIS:  Encounter Diagnoses  Name Primary?   Malignant neoplasm of lower-outer quadrant of right breast of female, estrogen receptor positive ([place]) Yes   Malignant neoplasm metastatic to bone ([place])     SUMMARY OF ONCOLOGIC HISTORY: Oncology History  Malignant neoplasm of lower-outer quadrant of right breast of female, estrogen receptor positive ([place])  03/22/2016 Surgery   left lumpectomy 03/22/2016 showing atypical lobular hyperplasia prophylactic tamoxifen taken from 04/2016 through 03/2019   06/06/2019 Genetic Testing   Negative genetic testing:  No pathogenic variants detected on the [facility] panel, ordered by Dr. Helane Rima at [facility]. Two variants of uncertain significance (VUS) were detected - one in the BRIP1 gene called c.2863A>C and a second in the NBN gene called c.643C>T. The report date is 06/06/2019.  The [facility] gene panel offered by [employer] includes sequencing and deletion/duplication testing of the following 35 genes: APC, ATM, AXIN2, BARD1, BMPR1A, BRCA1, BRCA2, BRIP1, CDH1, CDK4, CDKN2A, CHEK2, EPCAM (large rearrangement only), HOXB13 (sequencing only), GALNT12, MLH1, MSH2, MSH3 (excluding repetitive portions of exon 1), MSH6, MUTYH, NBN, NTHL1, PALB2, PMS2, PTEN, RAD51C, RAD51D, RNF43, RPS20, SMAD4, STK11, and TP53. Sequencing was performed for select regions of POLE and POLD1, and large rearrangement analysis was performed for select regions of GREM1.     06/25/2020 Relapse/Recurrence   right breast lower outer quadrant biopsy 06/25/2020 shows a  clinical T2 N1, stage IB invasive ductal carcinoma, grade 2 or 3, triple positive, with an MIB-1 of 40%   07/02/2020 Cancer Staging   Staging form: Breast, AJCC 8th Edition - Clinical stage from 07/02/2020: Stage IV (cT2, cN1, cM1, G3, ER+, PR+, HER2+) - Signed by Chauncey Cruel, MD on 08/04/2020 Stage prefix: Initial diagnosis Histologic grading system: 3 grade system Laterality: Right Staged by: Pathologist and managing physician Stage used in treatment planning: Yes National guidelines used in treatment planning: Yes Type of national guideline used in treatment planning: NCCN   07/16/2020 - 09/30/2020 Chemotherapy   neoadjuvant chemotherapy consisting of docetaxel, carboplatin, trastuzumab and pertuzumab every 21 days x 4 cycles started 07/28/2020, last chemotherapy 09/30/2020       08/01/2020 PET scan   PET scan 08/01/2020 shows no hypermetabolism in the 1.2 cm right liver lesion; bony hypermetabolism consistent with known mets   08/19/2020 - 10/21/2020 Chemotherapy   Patient is on Treatment Plan : BREAST  Trastuzumab + Pertuzumab q21d      10/07/2020 Imaging    total spinal MRI 10/07/2020 shows stable to improved disease, no extraosseous extension, pathologic fracture, epidural or intracanalicular involvement MRI of the breast 10/19/2020 shows a complete imaging response in the breast, with new ring-enhancing lesions in the left breast with MRI guided biopsy 11/10/2020 showing no evidence of malignancy   11/11/2020 - 01/18/2022 Chemotherapy   Patient is on Treatment Plan : BREAST Trastuzumab + Pertuzumab q21d     11/11/2020 -  Chemotherapy   Patient is on Treatment Plan : BREAST Trastuzumab IV (8/6) or SQ (600) D1 q21d     01/02/2021 Surgery   right lumpectomy with no sentinel lymph node sampling 01/02/2021 showed scattered foci of residual invasive ductal carcinoma, grade 2, the largest measuring  0.8 cm.  Margins were negative   01/02/2021 -  Anti-estrogen oral therapy   anastrozole  started 01/12/2021   Malignant neoplasm metastatic to bone ([place])  08/04/2020 Initial Diagnosis   Bone metastases ([place])   11/11/2020 - 01/18/2022 Chemotherapy   Patient is on Treatment Plan : BREAST Trastuzumab + Pertuzumab q21d     11/11/2020 -  Chemotherapy   Patient is on Treatment Plan : BREAST Trastuzumab IV (8/6) or SQ (600) D1 q21d       CHIEF COMPLIANT: Herceptin Perjeta, with letrozole   INTERVAL HISTORY: Kim Archer is a 56 y.o. with above-mentioned history of metastatic breast cancer is currently on Herceptin and Perjeta maintenance. She presents to the clinic today for a follow-up. She is tolerating the letrozole and herceptin extremely well.   ALLERGIES:  is allergic to ciprofloxacin, ciprofibrate, gabapentin, sulfa antibiotics, and venlafaxine.  MEDICATIONS:  Current Outpatient Medications  Medication Sig Dispense Refill   Biotin 5 MG CAPS Take 1 capsule (5 mg total) by mouth daily.  0   Boron 3 MG CAPS Take by mouth.     Calcium 500 MG tablet Take 600 mg by mouth.     fluconazole (DIFLUCAN) 150 MG tablet Take 1 tablet (150 mg total) by mouth daily. 2 tablet 0   ibuprofen (ADVIL) 800 MG tablet ibuprofen 800 mg tablet  TAKE 1 TABLET BY MOUTH THREE TIMES A DAY     ipratropium (ATROVENT) 0.06 % nasal spray Place into both nostrils.     letrozole (FEMARA) 2.5 MG tablet Take 1 tablet (2.5 mg total) by mouth daily. 90 tablet 3   lidocaine-prilocaine (EMLA) cream Apply topically daily.     loratadine (CLARITIN) 10 MG tablet Take 10 mg by mouth daily.     LUTEIN PO Take by mouth.     MAGNESIUM PO Take 400 mg by mouth at bedtime.     Menatetrenone (VITAMIN K2) 100 MCG TABS Take by mouth.     Multiple Vitamin (MULTIVITAMIN WITH MINERALS) TABS tablet Take 1 tablet by mouth daily.     OVER THE COUNTER MEDICATION Take 1 capsule by mouth daily. [place] Mushroom 1242m     terbinafine (LAMISIL) 250 MG tablet Please take one a day x 7days, repeat every 4 weeks x 4 months 28  tablet 0   Ubiquinol 50 MG CAPS Take by mouth daily.     VITAMIN D PO 50 mcg.     zinc sulfate 220 (50 Zn) MG capsule Take 220 mg by mouth daily.     No current facility-administered medications for this visit.    PHYSICAL EXAMINATION: ECOG PERFORMANCE STATUS: 1 - Symptomatic but completely ambulatory  Vitals:   02/16/22 0906  BP: 111/68  Pulse: 63  Temp: 97.8 F (36.6 C)  SpO2: 99%   Filed Weights   02/16/22 0906  Weight: 179 lb 8 oz (81.4 kg)     LABORATORY DATA:  I have reviewed the data as listed    Latest Ref Rng & Units 11/23/2021    8:30 AM 10/26/2021    8:47 AM 08/31/2021    8:44 AM  CMP  Glucose 70 - 99 mg/dL 121  113  94   BUN 6 - 20 mg/dL _0 Creatinine 0.44 - 1.00 mg/dL 0.80  0.92  0.79   Sodium 135 - 145 mmol/L 142  142  141   Potassium 3.5 - 5.1 mmol/L 3.5  3.9  3.9   Chloride 98 -  111 mmol/L 108  107  107   CO2 22 - 32 mmol/L _0 Calcium 8.9 - 10.3 mg/dL 9.5  9.6  9.9   Total Protein 6.5 - 8.1 g/dL 7.2  6.8  7.1   Total Bilirubin 0.3 - 1.2 mg/dL 0.3  0.4  0.4   Alkaline Phos 38 - 126 U/L 88  65  79   AST 15 - 41 U/L _1 ALT 0 - 44 U/L _2 Lab Results  Component Value Date   WBC 6.2 02/16/2022   HGB 13.6 02/16/2022   HCT 40.2 02/16/2022   MCV 92.8 02/16/2022   PLT 219 02/16/2022   NEUTROABS 3.7 02/16/2022    ASSESSMENT & PLAN:  Malignant neoplasm of lower-outer quadrant of right breast of female, estrogen receptor positive ([place]) Metastatic Breast cancer ER/PR and Her 2 Positive: S/p NAC with TCHP and BCS Current Treatment: HP maintenance with Anastrozole and Xgeva switched to letrozole 06/08/2021, Perjeta discontinued from 11/23/2021   Toxicities:  1.  Joint pains and stiffness:on letrozole 2. hot flashes 3. Diarrhea from Perjeta: Manageable, we discontinued Perjeta.   08/15/2021: CT CAP: No findings of recurrence of breast cancer in the chest.  Stable sclerotic bone lesions consistent with treated  metastatic disease.  No abnormalities in the liver.    Continue with Herceptin maintenance monthly along with letrozole We will also move her Zometa to every 6 months. CT scan scheduled for 02/23/2021. Telephone visit after the CT scan to discuss result  Return to clinic in 3 months     No orders of the defined types were placed in this encounter.  The patient has a good understanding of the overall plan. she agrees with it. she will call with any problems that may develop before the next visit here. Total time spent: 30 mins including face to face time and time spent for planning, charting and co-ordination of care   Harriette Ohara, MD 02/16/22    I Gardiner Coins am acting as a Education administrator for [employer]  I have reviewed the above documentation for accuracy and completeness, and I agree with the above.

## 2022-02-12 ENCOUNTER — Other Ambulatory Visit: Payer: Commercial Managed Care - PPO

## 2022-02-16 ENCOUNTER — Inpatient Hospital Stay: Payer: Commercial Managed Care - PPO | Attending: Hematology and Oncology

## 2022-02-16 ENCOUNTER — Inpatient Hospital Stay: Payer: Commercial Managed Care - PPO

## 2022-02-16 ENCOUNTER — Inpatient Hospital Stay (HOSPITAL_BASED_OUTPATIENT_CLINIC_OR_DEPARTMENT_OTHER): Payer: Commercial Managed Care - PPO | Admitting: Hematology and Oncology

## 2022-02-16 ENCOUNTER — Other Ambulatory Visit: Payer: Self-pay

## 2022-02-16 VITALS — BP 111/68 | HR 63 | Temp 97.8°F | Wt 179.5 lb

## 2022-02-16 VITALS — Resp 18

## 2022-02-16 DIAGNOSIS — C50511 Malignant neoplasm of lower-outer quadrant of right female breast: Secondary | ICD-10-CM | POA: Diagnosis not present

## 2022-02-16 DIAGNOSIS — M899 Disorder of bone, unspecified: Secondary | ICD-10-CM

## 2022-02-16 DIAGNOSIS — Z5112 Encounter for antineoplastic immunotherapy: Secondary | ICD-10-CM | POA: Diagnosis present

## 2022-02-16 DIAGNOSIS — Z170 Estrogen receptor positive status [ER+]: Secondary | ICD-10-CM | POA: Diagnosis not present

## 2022-02-16 DIAGNOSIS — Z95828 Presence of other vascular implants and grafts: Secondary | ICD-10-CM

## 2022-02-16 DIAGNOSIS — Z79899 Other long term (current) drug therapy: Secondary | ICD-10-CM | POA: Insufficient documentation

## 2022-02-16 DIAGNOSIS — C7951 Secondary malignant neoplasm of bone: Secondary | ICD-10-CM | POA: Insufficient documentation

## 2022-02-16 DIAGNOSIS — Z79811 Long term (current) use of aromatase inhibitors: Secondary | ICD-10-CM | POA: Insufficient documentation

## 2022-02-16 DIAGNOSIS — Z7189 Other specified counseling: Secondary | ICD-10-CM

## 2022-02-16 LAB — CBC WITH DIFFERENTIAL (CANCER CENTER ONLY)
Abs Immature Granulocytes: 0.02 10*3/uL (ref 0.00–0.07)
Basophils Absolute: 0 10*3/uL (ref 0.0–0.1)
Basophils Relative: 1 %
Eosinophils Absolute: 0.1 10*3/uL (ref 0.0–0.5)
Eosinophils Relative: 2 %
HCT: 40.2 % (ref 36.0–46.0)
Hemoglobin: 13.6 g/dL (ref 12.0–15.0)
Immature Granulocytes: 0 %
Lymphocytes Relative: 34 %
Lymphs Abs: 2.1 10*3/uL (ref 0.7–4.0)
MCH: 31.4 pg (ref 26.0–34.0)
MCHC: 33.8 g/dL (ref 30.0–36.0)
MCV: 92.8 fL (ref 80.0–100.0)
Monocytes Absolute: 0.3 10*3/uL (ref 0.1–1.0)
Monocytes Relative: 5 %
Neutro Abs: 3.7 10*3/uL (ref 1.7–7.7)
Neutrophils Relative %: 58 %
Platelet Count: 219 10*3/uL (ref 150–400)
RBC: 4.33 MIL/uL (ref 3.87–5.11)
RDW: 12.3 % (ref 11.5–15.5)
WBC Count: 6.2 10*3/uL (ref 4.0–10.5)
nRBC: 0 % (ref 0.0–0.2)

## 2022-02-16 LAB — CMP (CANCER CENTER ONLY)
ALT: 24 U/L (ref 0–44)
AST: 18 U/L (ref 15–41)
Albumin: 4.2 g/dL (ref 3.5–5.0)
Alkaline Phosphatase: 77 U/L (ref 38–126)
Anion gap: 6 (ref 5–15)
BUN: 16 mg/dL (ref 6–20)
CO2: 30 mmol/L (ref 22–32)
Calcium: 9.9 mg/dL (ref 8.9–10.3)
Chloride: 106 mmol/L (ref 98–111)
Creatinine: 0.84 mg/dL (ref 0.44–1.00)
GFR, Estimated: >60 mL/min (ref >60)
Glucose, Bld: 106 mg/dL — ABNORMAL HIGH (ref 70–99)
Potassium: 3.7 mmol/L (ref 3.5–5.1)
Sodium: 142 mmol/L (ref 135–145)
Total Bilirubin: 0.3 mg/dL (ref 0.3–1.2)
Total Protein: 7 g/dL (ref 6.5–8.1)

## 2022-02-16 LAB — MAGNESIUM: Magnesium: 2.1 mg/dL (ref 1.7–2.4)

## 2022-02-16 MED ORDER — TRASTUZUMAB-ANNS CHEMO 150 MG IV SOLR
5.7000 mg/kg | Freq: Once | INTRAVENOUS | Status: AC
  Administered 2022-02-16: 462 mg via INTRAVENOUS
  Filled 2022-02-16: qty 22

## 2022-02-16 MED ORDER — SODIUM CHLORIDE 0.9% FLUSH
10.0000 mL | INTRAVENOUS | Status: DC | PRN
  Administered 2022-02-16: 10 mL

## 2022-02-16 MED ORDER — DIPHENHYDRAMINE HCL 25 MG PO CAPS: 50.0000 mg | ORAL_CAPSULE | Freq: Once | ORAL | Status: DC

## 2022-02-16 MED ORDER — SODIUM CHLORIDE 0.9 % IV SOLN: Freq: Once | INTRAVENOUS | Status: AC

## 2022-02-16 MED ORDER — SODIUM CHLORIDE 0.9% FLUSH
10.0000 mL | Freq: Once | INTRAVENOUS | Status: AC
  Administered 2022-02-16: 10 mL

## 2022-02-16 MED ORDER — ACETAMINOPHEN 325 MG PO TABS: 650.0000 mg | ORAL_TABLET | Freq: Once | ORAL | Status: DC

## 2022-02-16 MED ORDER — HEPARIN SOD (PORK) LOCK FLUSH 100 UNIT/ML IV SOLN
500.0000 [IU] | Freq: Once | INTRAVENOUS | Status: AC | PRN
  Administered 2022-02-16: 500 [IU]

## 2022-02-16 NOTE — Progress Notes (Signed)
HEMATOLOGY-ONCOLOGY TELEPHONE VISIT PROGRESS NOTE  I connected with our patient on 02/25/22 at  8:00 AM EST by telephone and verified that I am speaking with the correct person using two identifiers.  I discussed the limitations, risks, security and privacy concerns of performing an evaluation and management service by telephone and the availability of in person appointments.  I also discussed with the patient that there may be a patient responsible charge related to this service. The patient expressed understanding and agreed to proceed.   History of Present Illness: Kim Archer is a 57 y.o. with above-mentioned history of metastatic breast cancer is currently on Herceptin and Perjeta maintenance. She presents to the clinic today for a telephone follow-up to discuss ct scans.  Oncology History  Malignant neoplasm of lower-outer quadrant of right breast of female, estrogen receptor positive ([place])  03/22/2016 Surgery   left lumpectomy 03/22/2016 showing atypical lobular hyperplasia prophylactic tamoxifen taken from 04/2016 through 03/2019   06/06/2019 Genetic Testing   Negative genetic testing:  No pathogenic variants detected on the [facility] panel, ordered by Dr. Helane Rima at [facility]. Two variants of uncertain significance (VUS) were detected - one in the BRIP1 gene called c.2863A>C and a second in the NBN gene called c.643C>T. The report date is 06/06/2019.  The [facility] gene panel offered by [employer] includes sequencing and deletion/duplication testing of the following 35 genes: APC, ATM, AXIN2, BARD1, BMPR1A, BRCA1, BRCA2, BRIP1, CDH1, CDK4, CDKN2A, CHEK2, EPCAM (large rearrangement only), HOXB13 (sequencing only), GALNT12, MLH1, MSH2, MSH3 (excluding repetitive portions of exon 1), MSH6, MUTYH, NBN, NTHL1, PALB2, PMS2, PTEN, RAD51C, RAD51D, RNF43, RPS20, SMAD4, STK11, and TP53. Sequencing was performed for select regions of POLE and POLD1, and large  rearrangement analysis was performed for select regions of GREM1.     06/25/2020 Relapse/Recurrence   right breast lower outer quadrant biopsy 06/25/2020 shows a clinical T2 N1, stage IB invasive ductal carcinoma, grade 2 or 3, triple positive, with an MIB-1 of 40%   07/02/2020 Cancer Staging   Staging form: Breast, AJCC 8th Edition - Clinical stage from 07/02/2020: Stage IV (cT2, cN1, cM1, G3, ER+, PR+, HER2+) - Signed by Chauncey Cruel, MD on 08/04/2020 Stage prefix: Initial diagnosis Histologic grading system: 3 grade system Laterality: Right Staged by: Pathologist and managing physician Stage used in treatment planning: Yes National guidelines used in treatment planning: Yes Type of national guideline used in treatment planning: NCCN   07/16/2020 - 09/30/2020 Chemotherapy   neoadjuvant chemotherapy consisting of docetaxel, carboplatin, trastuzumab and pertuzumab every 21 days x 4 cycles started 07/28/2020, last chemotherapy 09/30/2020       08/01/2020 PET scan   PET scan 08/01/2020 shows no hypermetabolism in the 1.2 cm right liver lesion; bony hypermetabolism consistent with known mets   08/19/2020 - 10/21/2020 Chemotherapy   Patient is on Treatment Plan : BREAST  Trastuzumab + Pertuzumab q21d      10/07/2020 Imaging    total spinal MRI 10/07/2020 shows stable to improved disease, no extraosseous extension, pathologic fracture, epidural or intracanalicular involvement MRI of the breast 10/19/2020 shows a complete imaging response in the breast, with new ring-enhancing lesions in the left breast with MRI guided biopsy 11/10/2020 showing no evidence of malignancy   11/11/2020 - 01/18/2022 Chemotherapy   Patient is on Treatment Plan : BREAST Trastuzumab + Pertuzumab q21d     11/11/2020 -  Chemotherapy   Patient is on Treatment Plan : BREAST Trastuzumab IV (8/6) or SQ (  600) D1 q21d     01/02/2021 Surgery   right lumpectomy with no sentinel lymph node sampling 01/02/2021 showed scattered  foci of residual invasive ductal carcinoma, grade 2, the largest measuring 0.8 cm.  Margins were negative   01/02/2021 -  Anti-estrogen oral therapy   anastrozole started 01/12/2021   Malignant neoplasm metastatic to bone ([place])  08/04/2020 Initial Diagnosis   Bone metastases ([place])   11/11/2020 - 01/18/2022 Chemotherapy   Patient is on Treatment Plan : BREAST Trastuzumab + Pertuzumab q21d     11/11/2020 -  Chemotherapy   Patient is on Treatment Plan : BREAST Trastuzumab IV (8/6) or SQ (600) D1 q21d       REVIEW OF SYSTEMS:   Constitutional: Denies fevers, chills or abnormal weight loss All other systems were reviewed with the patient and are negative. Observations/Objective:     Assessment Plan:  Malignant neoplasm of lower-outer quadrant of right breast of female, estrogen receptor positive ([place]) Metastatic Breast cancer ER/PR and Her 2 Positive: S/p NAC with TCHP and BCS Current Treatment: HP maintenance with Anastrozole and Xgeva switched to letrozole 06/08/2021, Perjeta discontinued from 11/23/2021.  Now on Herceptin maintenance   Toxicities:  1.  Joint pains and stiffness:on letrozole 2. hot flashes 3. Diarrhea from Perjeta: Manageable, we discontinued Perjeta.   08/15/2021: CT CAP: No findings of recurrence of breast cancer in the chest.  Stable sclerotic bone lesions consistent with treated metastatic disease.  No abnormalities in the liver.    Continue with Herceptin maintenance monthly along with letrozole We will also move her Zometa to every 6 months.  CT CAP 02/23/2021: Unchanged bone metastases.  Slight interval enlargement of lesion left lobe of the liver measuring 1 x 0.8 cm previously about 0.5 cm.  Unclear if this is a metastatic deposit.  This was not hypermetabolic on previous scans.   Liver lesion: To evaluate this further we will obtain a PET CT scan.  If the PET CT scan shows that the lesion is hypermetabolic then we may have to get a biopsy of the  lesion. Telephone visit follow-up to discuss results of the PET scan.    I discussed the assessment and treatment plan with the patient. The patient was provided an opportunity to ask questions and all were answered. The patient agreed with the plan and demonstrated an understanding of the instructions. The patient was advised to call back or seek an in-person evaluation if the symptoms worsen or if the condition fails to improve as anticipated.   I provided 12 minutes of non-face-to-face time during this encounter.  This includes time for charting and coordination of care   Harriette Ohara, MD  I Gardiner Coins am acting as a scribe for Dr.Osmany Azer  I have reviewed the above documentation for accuracy and completeness, and I agree with the above.

## 2022-02-16 NOTE — Assessment & Plan Note (Signed)
Metastatic Breast cancer ER/PR and Her 2 Positive: S/p NAC with TCHP and BCS Current Treatment: HP maintenance with Anastrozole and Xgeva switched to letrozole 06/08/2021, Perjeta discontinued from 11/23/2021   Toxicities:  1.  Joint pains and stiffness:on letrozole 2. hot flashes 3. Diarrhea from Perjeta: Manageable, we discontinued Perjeta.   08/15/2021: CT CAP: No findings of recurrence of breast cancer in the chest.  Stable sclerotic bone lesions consistent with treated metastatic disease.  No abnormalities in the liver.    Continue with Herceptin maintenance monthly along with letrozole We will also move her Zometa to every 6 months. CT scan scheduled for 02/23/2021. Telephone visit after the CT scan to discuss result  Return to clinic in 3 months

## 2022-02-16 NOTE — Patient Instructions (Signed)
Kim [facility] ONCOLOGY  Discharge Instructions: Thank you for choosing [facility] to provide your oncology and hematology care.   If you have a lab appointment with the [facility], please go directly to the [facility] and check in at the registration area.   Wear comfortable clothing and clothing appropriate for easy access to any Portacath or PICC line.   We strive to give you quality time with your provider. You may need to reschedule your appointment if you arrive late (15 or more minutes).  Arriving late affects you and other patients whose appointments are after yours.  Also, if you miss three or more appointments without notifying the office, you may be dismissed from the clinic at the provider's discretion.      For prescription refill requests, have your pharmacy contact our office and allow 72 hours for refills to be completed.    Today you received the following chemotherapy and/or immunotherapy agent: Trastuzumab ([place]jinti)   To help prevent nausea and vomiting after your treatment, we encourage you to take your nausea medication as directed.  BELOW ARE SYMPTOMS THAT SHOULD BE REPORTED IMMEDIATELY: *FEVER GREATER THAN 100.4 F (38 C) OR HIGHER *CHILLS OR SWEATING *NAUSEA AND VOMITING THAT IS NOT CONTROLLED WITH YOUR NAUSEA MEDICATION *UNUSUAL SHORTNESS OF BREATH *UNUSUAL BRUISING OR BLEEDING *URINARY PROBLEMS (pain or burning when urinating, or frequent urination) *BOWEL PROBLEMS (unusual diarrhea, constipation, pain near the anus) TENDERNESS IN MOUTH AND THROAT WITH OR WITHOUT PRESENCE OF ULCERS (sore throat, sores in mouth, or a toothache) UNUSUAL RASH, SWELLING OR PAIN  UNUSUAL VAGINAL DISCHARGE OR ITCHING   Items with * indicate a potential emergency and should be followed up as soon as possible or go to the Emergency Department if any problems should occur.  Please show the CHEMOTHERAPY ALERT CARD or IMMUNOTHERAPY ALERT CARD at  check-in to the Emergency Department and triage nurse.  Should you have questions after your visit or need to cancel or reschedule your appointment, please contact [facility]  Dept: [phone]  and follow the prompts.  Office hours are 8:00 a.m. to 4:30 p.m. Monday - Friday. Please note that voicemails left after 4:00 p.m. may not be returned until the following business day.  We are closed weekends and major holidays. You have access to a nurse at all times for urgent questions. Please call the main number to the clinic Dept: [phone] and follow the prompts.   For any non-urgent questions, you may also contact your provider using MyChart. We now offer e-Visits for anyone 63 and older to request care online for non-urgent symptoms. For details visit [email or web address].   Also download the MyChart app! Go to the app store, search "MyChart", open the app, select [facility], and log in with your MyChart username and password.  Masks are optional in the cancer centers. If you would like for your care team to wear a mask while they are taking care of you, please let them know. You may have one support person who is at least 58 years old accompany you for your appointments. Trastuzumab Injection What is this medication? TRASTUZUMAB (tras TOO zoo mab) treats breast cancer and stomach cancer. It works by blocking a protein that causes cancer cells to grow and multiply. This helps to slow or stop the spread of cancer cells. This medicine may be used for other purposes; ask your health care provider or pharmacist if you have questions. COMMON BRAND NAME(S):  Herceptin, Donnald Garre What should I tell my care team before I take this medication? They need to know if you have any of these conditions: Heart failure Lung disease An unusual or allergic reaction to trastuzumab, other medications, foods, dyes, or preservatives Pregnant or  trying to get pregnant Breast-feeding How should I use this medication? This medication is injected into a vein. It is given by your care team in a hospital or clinic setting. Talk to your care team about the use of this medication in children. It is not approved for use in children. Overdosage: If you think you have taken too much of this medicine contact a poison control center or emergency room at once. NOTE: This medicine is only for you. Do not share this medicine with others. What if I miss a dose? Keep appointments for follow-up doses. It is important not to miss your dose. Call your care team if you are unable to keep an appointment. What may interact with this medication? Certain types of chemotherapy, such as daunorubicin, doxorubicin, epirubicin, idarubicin This list may not describe all possible interactions. Give your health care provider a list of all the medicines, herbs, non-prescription drugs, or dietary supplements you use. Also tell them if you smoke, drink alcohol, or use illegal drugs. Some items may interact with your medicine. What should I watch for while using this medication? Your condition will be monitored carefully while you are receiving this medication. This medication may make you feel generally unwell. This is not uncommon, as chemotherapy affects healthy cells as well as cancer cells. Report any side effects. Continue your course of treatment even though you feel ill unless your care team tells you to stop. This medication may increase your risk of getting an infection. Call your care team for advice if you get a fever, chills, sore throat, or other symptoms of a cold or flu. Do not treat yourself. Try to avoid being around people who are sick. Avoid taking medications that contain aspirin, acetaminophen, ibuprofen, naproxen, or ketoprofen unless instructed by your care team. These medications can hide a fever. Talk to your care team if you may be pregnant. Serious  birth defects can occur if you take this medication during pregnancy and for 7 months after the last dose. You will need a negative pregnancy test before starting this medication. Contraception is recommended while taking this medication and for 7 months after the last dose. Your care team can help you find the option that works for you. Do not breastfeed while taking this medication and for 7 months after stopping treatment. What side effects may I notice from receiving this medication? Side effects that you should report to your care team as soon as possible: Allergic reactions or angioedema--skin rash, itching or hives, swelling of the face, eyes, lips, tongue, arms, or legs, trouble swallowing or breathing Dry cough, shortness of breath or trouble breathing Heart failure--shortness of breath, swelling of the ankles, feet, or hands, sudden weight gain, unusual weakness or fatigue Infection--fever, chills, cough, or sore throat Infusion reactions--chest pain, shortness of breath or trouble breathing, feeling faint or lightheaded Side effects that usually do not require medical attention (report to your care team if they continue or are bothersome): Diarrhea Dizziness Headache Nausea Trouble sleeping Vomiting This list may not describe all possible side effects. Call your doctor for medical advice about side effects. You may report side effects to FDA at 1-800-FDA-1088. Where should I keep my medication? This medication  is given in a hospital or clinic. It will not be stored at home. NOTE: This sheet is a summary. It may not cover all possible information. If you have questions about this medicine, talk to your doctor, pharmacist, or health care provider.  2023 Elsevier/Gold Standard (2021-06-04 00:00:00)

## 2022-02-17 ENCOUNTER — Other Ambulatory Visit: Payer: Self-pay

## 2022-02-23 ENCOUNTER — Encounter (HOSPITAL_COMMUNITY): Payer: Self-pay

## 2022-02-23 ENCOUNTER — Ambulatory Visit (HOSPITAL_COMMUNITY)
Admission: RE | Admit: 2022-02-23 | Discharge: 2022-02-23 | Disposition: A | Discharge status: Home / Self Care | Payer: Commercial Managed Care - PPO | Source: Ambulatory Visit | Attending: Hematology and Oncology | Admitting: Hematology and Oncology

## 2022-02-23 DIAGNOSIS — Z170 Estrogen receptor positive status [ER+]: Secondary | ICD-10-CM | POA: Diagnosis present

## 2022-02-23 DIAGNOSIS — C50511 Malignant neoplasm of lower-outer quadrant of right female breast: Secondary | ICD-10-CM | POA: Insufficient documentation

## 2022-02-23 MED ORDER — SODIUM CHLORIDE (PF) 0.9 % IJ SOLN
INTRAMUSCULAR | Status: AC
  Filled 2022-02-23: qty 50

## 2022-02-23 MED ORDER — IOHEXOL 300 MG/ML  SOLN
100.0000 mL | Freq: Once | INTRAMUSCULAR | Status: AC | PRN
  Administered 2022-02-23: 100 mL via INTRAVENOUS

## 2022-02-25 ENCOUNTER — Inpatient Hospital Stay (HOSPITAL_BASED_OUTPATIENT_CLINIC_OR_DEPARTMENT_OTHER): Payer: Commercial Managed Care - PPO | Admitting: Hematology and Oncology

## 2022-02-25 DIAGNOSIS — C50511 Malignant neoplasm of lower-outer quadrant of right female breast: Secondary | ICD-10-CM | POA: Diagnosis not present

## 2022-02-25 DIAGNOSIS — C7951 Secondary malignant neoplasm of bone: Secondary | ICD-10-CM

## 2022-02-25 DIAGNOSIS — Z170 Estrogen receptor positive status [ER+]: Secondary | ICD-10-CM

## 2022-02-25 DIAGNOSIS — K769 Liver disease, unspecified: Secondary | ICD-10-CM

## 2022-02-25 NOTE — Assessment & Plan Note (Signed)
Metastatic Breast cancer ER/PR and Her 2 Positive: S/p NAC with TCHP and BCS Current Treatment: HP maintenance with Anastrozole and Xgeva switched to letrozole 06/08/2021, Perjeta discontinued from 11/23/2021.  Now on Herceptin maintenance   Toxicities:  1.  Joint pains and stiffness:on letrozole 2. hot flashes 3. Diarrhea from Perjeta: Manageable, we discontinued Perjeta.   08/15/2021: CT CAP: No findings of recurrence of breast cancer in the chest.  Stable sclerotic bone lesions consistent with treated metastatic disease.  No abnormalities in the liver.    Continue with Herceptin maintenance monthly along with letrozole We will also move her Zometa to every 6 months.  CT CAP 02/23/2021: Unchanged bone metastases.  Slight interval enlargement of lesion left lobe of the liver measuring 1 x 0.8 cm previously about 0.5 cm.  Unclear if this is a metastatic deposit.  This was not hypermetabolic on previous scans.   Return to clinic in 3 months

## 2022-03-02 ENCOUNTER — Telehealth: Payer: Self-pay | Admitting: Hematology and Oncology

## 2022-03-02 NOTE — Telephone Encounter (Signed)
Scheduled appointment per los. Left voicemail. 

## 2022-03-11 ENCOUNTER — Ambulatory Visit (INDEPENDENT_AMBULATORY_CARE_PROVIDER_SITE_OTHER): Payer: Commercial Managed Care - PPO

## 2022-03-11 DIAGNOSIS — B351 Tinea unguium: Secondary | ICD-10-CM | POA: Insufficient documentation

## 2022-03-11 NOTE — Progress Notes (Signed)
Patient presents today for the 1st laser treatment. Diagnosed with mycotic nail infection by Dr. Paulla Dolly.   Toenail most affected Bilateral hallux.  All other systems are negative.  Nails were filed thin. Laser therapy was administered to no affected nails once twice on bilateral hallux and patient tolerated the treatment well. All safety precautions were in place.   Single laser pass was done on non-affected nails.   Follow up in 4 weeks for laser # 2.  Picture of nails taken today to document visual progress

## 2022-03-12 ENCOUNTER — Encounter (HOSPITAL_COMMUNITY)
Admission: RE | Admit: 2022-03-12 | Discharge: 2022-03-12 | Disposition: A | Discharge status: Home / Self Care | Payer: Commercial Managed Care - PPO | Source: Ambulatory Visit | Attending: Hematology and Oncology | Admitting: Hematology and Oncology

## 2022-03-12 ENCOUNTER — Other Ambulatory Visit: Payer: Self-pay

## 2022-03-12 DIAGNOSIS — C50511 Malignant neoplasm of lower-outer quadrant of right female breast: Secondary | ICD-10-CM | POA: Diagnosis present

## 2022-03-12 DIAGNOSIS — K769 Liver disease, unspecified: Secondary | ICD-10-CM | POA: Insufficient documentation

## 2022-03-12 DIAGNOSIS — C7951 Secondary malignant neoplasm of bone: Secondary | ICD-10-CM | POA: Diagnosis present

## 2022-03-12 DIAGNOSIS — Z170 Estrogen receptor positive status [ER+]: Secondary | ICD-10-CM | POA: Diagnosis present

## 2022-03-12 LAB — GLUCOSE, CAPILLARY: Glucose-Capillary: 99 mg/dL (ref 70–99)

## 2022-03-12 MED ORDER — FLUDEOXYGLUCOSE F - 18 (FDG) INJECTION
8.9000 | Freq: Once | INTRAVENOUS | Status: AC
  Administered 2022-03-12: 8.9 via INTRAVENOUS

## 2022-03-15 ENCOUNTER — Other Ambulatory Visit: Payer: Self-pay

## 2022-03-15 ENCOUNTER — Inpatient Hospital Stay: Payer: Commercial Managed Care - PPO

## 2022-03-15 ENCOUNTER — Inpatient Hospital Stay (HOSPITAL_BASED_OUTPATIENT_CLINIC_OR_DEPARTMENT_OTHER): Payer: Commercial Managed Care - PPO | Admitting: Hematology and Oncology

## 2022-03-15 VITALS — BP 127/72 | HR 62 | Temp 97.7°F | Resp 13 | Wt 181.4 lb

## 2022-03-15 DIAGNOSIS — C7951 Secondary malignant neoplasm of bone: Secondary | ICD-10-CM

## 2022-03-15 DIAGNOSIS — Z5112 Encounter for antineoplastic immunotherapy: Secondary | ICD-10-CM | POA: Diagnosis not present

## 2022-03-15 DIAGNOSIS — C50511 Malignant neoplasm of lower-outer quadrant of right female breast: Secondary | ICD-10-CM

## 2022-03-15 DIAGNOSIS — Z170 Estrogen receptor positive status [ER+]: Secondary | ICD-10-CM | POA: Diagnosis not present

## 2022-03-15 MED ORDER — SODIUM CHLORIDE 0.9 % IV SOLN: Freq: Once | INTRAVENOUS | Status: AC

## 2022-03-15 MED ORDER — HEPARIN SOD (PORK) LOCK FLUSH 100 UNIT/ML IV SOLN
500.0000 [IU] | Freq: Once | INTRAVENOUS | Status: AC | PRN
  Administered 2022-03-15: 500 [IU]

## 2022-03-15 MED ORDER — SODIUM CHLORIDE 0.9% FLUSH
10.0000 mL | INTRAVENOUS | Status: DC | PRN
  Administered 2022-03-15: 10 mL

## 2022-03-15 MED ORDER — TRASTUZUMAB-ANNS CHEMO 420 MG IV SOLR
6.0000 mg/kg | Freq: Once | INTRAVENOUS | Status: AC
  Administered 2022-03-15: 483 mg via INTRAVENOUS
  Filled 2022-03-15: qty 23

## 2022-03-15 MED ORDER — DIPHENHYDRAMINE HCL 25 MG PO CAPS: 50.0000 mg | ORAL_CAPSULE | Freq: Once | ORAL | Status: DC

## 2022-03-15 MED ORDER — ACETAMINOPHEN 325 MG PO TABS: 650.0000 mg | ORAL_TABLET | Freq: Once | ORAL | Status: DC

## 2022-03-15 NOTE — Progress Notes (Signed)
Per Lindi Adie MD, ok to treat with ECHO results from 01/20/22.

## 2022-03-15 NOTE — Progress Notes (Signed)
Per patient- she took her tylenol at home about 1400 and does not take her benadryl, anymore. Dr. Lindi Adie aware- past flowsheets show no previous benadryl or tylenol.

## 2022-03-15 NOTE — Assessment & Plan Note (Addendum)
Metastatic Breast cancer ER/PR and Her 2 Positive: S/p NAC with TCHP and BCS   Current Treatment: HP maintenance with Anastrozole and Xgeva switched to letrozole 06/08/2021, Perjeta discontinued from 11/23/2021.  Now on Herceptin maintenance   Toxicities:  1.  Joint pains and stiffness:on letrozole 2. hot flashes 3. Diarrhea from Perjeta: Manageable, we discontinued Perjeta.   Continue with Herceptin maintenance monthly along with letrozole We will also move her Zometa to every 6 months.   Liver lesion: PET/CT 03/14/2022: No evidence of hypermetabolic metastatic disease in the chest abdomen or pelvis.  Cystic lesions within the liver without any hypermetabolism, scattered sclerotic lesions treated bone metastases. Based on these findings we do not see that the liver lesions are metastatic sites and therefore we will watch and monitor them.   Itching of the skin of forearms and joint pain: I instructed her to stop letrozole for 3 weeks and when she returns back for her infusion of Herceptin we can make a decision as to whether or not we can continue with letrozole or switch her to another medication.   

## 2022-03-15 NOTE — Patient Instructions (Signed)
[facility]  Discharge Instructions: Thank you for choosing [facility] to provide your oncology and hematology care.   If you have a lab appointment with the [facility], please go directly to the [facility] and check in at the registration area.   Wear comfortable clothing and clothing appropriate for easy access to any Portacath or PICC line.   We strive to give you quality time with your provider. You may need to reschedule your appointment if you arrive late (15 or more minutes).  Arriving late affects you and other patients whose appointments are after yours.  Also, if you miss three or more appointments without notifying the office, you may be dismissed from the clinic at the provider's discretion.      For prescription refill requests, have your pharmacy contact our office and allow 72 hours for refills to be completed.    Today you received the following chemotherapy and/or immunotherapy agent: Trastuzumab (Kanjinti)   To help prevent nausea and vomiting after your treatment, we encourage you to take your nausea medication as directed.  BELOW ARE SYMPTOMS THAT SHOULD BE REPORTED IMMEDIATELY: *FEVER GREATER THAN 100.4 F (38 C) OR HIGHER *CHILLS OR SWEATING *NAUSEA AND VOMITING THAT IS NOT CONTROLLED WITH YOUR NAUSEA MEDICATION *UNUSUAL SHORTNESS OF BREATH *UNUSUAL BRUISING OR BLEEDING *URINARY PROBLEMS (pain or burning when urinating, or frequent urination) *BOWEL PROBLEMS (unusual diarrhea, constipation, pain near the anus) TENDERNESS IN MOUTH AND THROAT WITH OR WITHOUT PRESENCE OF ULCERS (sore throat, sores in mouth, or a toothache) UNUSUAL RASH, SWELLING OR PAIN  UNUSUAL VAGINAL DISCHARGE OR ITCHING   Items with * indicate a potential emergency and should be followed up as soon as possible or go to the Emergency Department if any problems should occur.  Please show the CHEMOTHERAPY ALERT CARD or IMMUNOTHERAPY ALERT CARD  at check-in to the Emergency Department and triage nurse.  Should you have questions after your visit or need to cancel or reschedule your appointment, please contact [facility]  Dept: [phone]  and follow the prompts.  Office hours are 8:00 a.m. to 4:30 p.m. Monday - Friday. Please note that voicemails left after 4:00 p.m. may not be returned until the following business day.  We are closed weekends and major holidays. You have access to a nurse at all times for urgent questions. Please call the main number to the clinic Dept: [phone] and follow the prompts.   For any non-urgent questions, you may also contact your provider using MyChart. We now offer e-Visits for anyone 40 and older to request care online for non-urgent symptoms. For details visit [email or web address].   Also download the MyChart app! Go to the app store, search "MyChart", open the app, select [facility], and log in with your MyChart username and password.  Masks are optional in the cancer centers. If you would like for your care team to wear a mask while they are taking care of you, please let them know. You may have one support person who is at least 58 years old accompany you for your appointments. Trastuzumab Injection What is this medication? TRASTUZUMAB (tras TOO zoo mab) treats breast cancer and stomach cancer. It works by blocking a protein that causes cancer cells to grow and multiply. This helps to slow or stop the spread of cancer cells. This medicine may be used for other purposes; ask your health care provider or pharmacist if you have  questions. COMMON BRAND NAME(S): Herceptin, Janae Bridgeman, Ontruzant, Trazimera What should I tell my care team before I take this medication? They need to know if you have any of these conditions: Heart failure Lung disease An unusual or allergic reaction to trastuzumab, other medications, foods, dyes, or  preservatives Pregnant or trying to get pregnant Breast-feeding How should I use this medication? This medication is injected into a vein. It is given by your care team in a hospital or clinic setting. Talk to your care team about the use of this medication in children. It is not approved for use in children. Overdosage: If you think you have taken too much of this medicine contact a poison control center or emergency room at once. NOTE: This medicine is only for you. Do not share this medicine with others. What if I miss a dose? Keep appointments for follow-up doses. It is important not to miss your dose. Call your care team if you are unable to keep an appointment. What may interact with this medication? Certain types of chemotherapy, such as daunorubicin, doxorubicin, epirubicin, idarubicin This list may not describe all possible interactions. Give your health care provider a list of all the medicines, herbs, non-prescription drugs, or dietary supplements you use. Also tell them if you smoke, drink alcohol, or use illegal drugs. Some items may interact with your medicine. What should I watch for while using this medication? Your condition will be monitored carefully while you are receiving this medication. This medication may make you feel generally unwell. This is not uncommon, as chemotherapy affects healthy cells as well as cancer cells. Report any side effects. Continue your course of treatment even though you feel ill unless your care team tells you to stop. This medication may increase your risk of getting an infection. Call your care team for advice if you get a fever, chills, sore throat, or other symptoms of a cold or flu. Do not treat yourself. Try to avoid being around people who are sick. Avoid taking medications that contain aspirin, acetaminophen, ibuprofen, naproxen, or ketoprofen unless instructed by your care team. These medications can hide a fever. Talk to your care team if  you may be pregnant. Serious birth defects can occur if you take this medication during pregnancy and for 7 months after the last dose. You will need a negative pregnancy test before starting this medication. Contraception is recommended while taking this medication and for 7 months after the last dose. Your care team can help you find the option that works for you. Do not breastfeed while taking this medication and for 7 months after stopping treatment. What side effects may I notice from receiving this medication? Side effects that you should report to your care team as soon as possible: Allergic reactions or angioedema--skin rash, itching or hives, swelling of the face, eyes, lips, tongue, arms, or legs, trouble swallowing or breathing Dry cough, shortness of breath or trouble breathing Heart failure--shortness of breath, swelling of the ankles, feet, or hands, sudden weight gain, unusual weakness or fatigue Infection--fever, chills, cough, or sore throat Infusion reactions--chest pain, shortness of breath or trouble breathing, feeling faint or lightheaded Side effects that usually do not require medical attention (report to your care team if they continue or are bothersome): Diarrhea Dizziness Headache Nausea Trouble sleeping Vomiting This list may not describe all possible side effects. Call your doctor for medical advice about side effects. You may report side effects to FDA at 1-800-FDA-1088. Where should I keep  my medication? This medication is given in a hospital or clinic. It will not be stored at home. NOTE: This sheet is a summary. It may not cover all possible information. If you have questions about this medicine, talk to your doctor, pharmacist, or health care provider.  2023 Elsevier/Gold Standard (2021-06-04 00:00:00)

## 2022-03-15 NOTE — Progress Notes (Signed)
Patient Care Team: Pcp, No as PCP - General [name], MD as Consulting Physician (Obstetrics and Gynecology) Jovita Kussmaul, MD as Consulting Physician (General Surgery) Gery Pray, MD as Consulting Physician (Radiation Oncology) Larey Dresser, MD as Consulting Physician (Cardiology) Nicholas Lose, MD as Medical Oncologist (Hematology and Oncology)  DIAGNOSIS:  Encounter Diagnosis  Name Primary?   Malignant neoplasm of lower-outer quadrant of right breast of female, estrogen receptor positive ([place]) Yes    SUMMARY OF ONCOLOGIC HISTORY: Oncology History  Malignant neoplasm of lower-outer quadrant of right breast of female, estrogen receptor positive ([place])  03/22/2016 Surgery   left lumpectomy 03/22/2016 showing atypical lobular hyperplasia prophylactic tamoxifen taken from 04/2016 through 03/2019   06/06/2019 Genetic Testing   Negative genetic testing:  No pathogenic variants detected on the [facility] panel, ordered by Dr. Helane Rima at [facility]. Two variants of uncertain significance (VUS) were detected - one in the BRIP1 gene called c.2863A>C and a second in the NBN gene called c.643C>T. The report date is 06/06/2019.  The [facility] gene panel offered by [employer] includes sequencing and deletion/duplication testing of the following 35 genes: APC, ATM, AXIN2, BARD1, BMPR1A, BRCA1, BRCA2, BRIP1, CDH1, CDK4, CDKN2A, CHEK2, EPCAM (large rearrangement only), HOXB13 (sequencing only), GALNT12, MLH1, MSH2, MSH3 (excluding repetitive portions of exon 1), MSH6, MUTYH, NBN, NTHL1, PALB2, PMS2, PTEN, RAD51C, RAD51D, RNF43, RPS20, SMAD4, STK11, and TP53. Sequencing was performed for select regions of POLE and POLD1, and large rearrangement analysis was performed for select regions of GREM1.     06/25/2020 Relapse/Recurrence   right breast lower outer quadrant biopsy 06/25/2020 shows a clinical T2 N1, stage IB invasive ductal carcinoma, grade 2 or  3, triple positive, with an MIB-1 of 40%   07/02/2020 Cancer Staging   Staging form: Breast, AJCC 8th Edition - Clinical stage from 07/02/2020: Stage IV (cT2, cN1, cM1, G3, ER+, PR+, HER2+) - Signed by Chauncey Cruel, MD on 08/04/2020 Stage prefix: Initial diagnosis Histologic grading system: 3 grade system Laterality: Right Staged by: Pathologist and managing physician Stage used in treatment planning: Yes National guidelines used in treatment planning: Yes Type of national guideline used in treatment planning: NCCN   07/16/2020 - 09/30/2020 Chemotherapy   neoadjuvant chemotherapy consisting of docetaxel, carboplatin, trastuzumab and pertuzumab every 21 days x 4 cycles started 07/28/2020, last chemotherapy 09/30/2020       08/01/2020 PET scan   PET scan 08/01/2020 shows no hypermetabolism in the 1.2 cm right liver lesion; bony hypermetabolism consistent with known mets   08/19/2020 - 10/21/2020 Chemotherapy   Patient is on Treatment Plan : BREAST  Trastuzumab + Pertuzumab q21d      10/07/2020 Imaging    total spinal MRI 10/07/2020 shows stable to improved disease, no extraosseous extension, pathologic fracture, epidural or intracanalicular involvement MRI of the breast 10/19/2020 shows a complete imaging response in the breast, with new ring-enhancing lesions in the left breast with MRI guided biopsy 11/10/2020 showing no evidence of malignancy   11/11/2020 - 01/18/2022 Chemotherapy   Patient is on Treatment Plan : BREAST Trastuzumab + Pertuzumab q21d     11/11/2020 -  Chemotherapy   Patient is on Treatment Plan : BREAST Trastuzumab IV (8/6) or SQ (600) D1 q21d     01/02/2021 Surgery   right lumpectomy with no sentinel lymph node sampling 01/02/2021 showed scattered foci of residual invasive ductal carcinoma, grade 2, the largest measuring 0.8 cm.  Margins were negative   01/02/2021 -  Anti-estrogen oral therapy   anastrozole started 01/12/2021   Malignant neoplasm metastatic to bone  ([place])  08/04/2020 Initial Diagnosis   Bone metastases ([place])   11/11/2020 - 01/18/2022 Chemotherapy   Patient is on Treatment Plan : BREAST Trastuzumab + Pertuzumab q21d     11/11/2020 -  Chemotherapy   Patient is on Treatment Plan : BREAST Trastuzumab IV (8/6) or SQ (600) D1 q21d       CHIEF COMPLIANT: Review scans  INTERVAL HISTORY: Kim Archer is a 57 y.o. with above-mentioned history of metastatic breast cancer is currently on Herceptin and Perjeta maintenance. She presents to the clinic today for a follow-up. She reports that she has been doing good. She says that the Herceptin wasn't giving her any trouble. Her only complaint is her knees bothers her. She does do sone stretching in between working. She complains of rash and itching on the skin.   ALLERGIES:  is allergic to ciprofloxacin, ciprofibrate, gabapentin, sulfa antibiotics, and venlafaxine.  MEDICATIONS:  Current Outpatient Medications  Medication Sig Dispense Refill   mometasone (ELOCON) 0.1 % cream Apply 1 Application topically daily.     Biotin 5 MG CAPS Take 1 capsule (5 mg total) by mouth daily.  0   Boron 3 MG CAPS Take by mouth.     Calcium 500 MG tablet Take 600 mg by mouth.     ibuprofen (ADVIL) 800 MG tablet ibuprofen 800 mg tablet  TAKE 1 TABLET BY MOUTH THREE TIMES A DAY     ipratropium (ATROVENT) 0.06 % nasal spray Place into both nostrils.     letrozole (FEMARA) 2.5 MG tablet Take 1 tablet (2.5 mg total) by mouth daily. 90 tablet 3   lidocaine-prilocaine (EMLA) cream Apply topically daily.     loratadine (CLARITIN) 10 MG tablet Take 10 mg by mouth daily.     LUTEIN PO Take by mouth.     MAGNESIUM PO Take 400 mg by mouth at bedtime.     Menatetrenone (VITAMIN K2) 100 MCG TABS Take by mouth.     Multiple Vitamin (MULTIVITAMIN WITH MINERALS) TABS tablet Take 1 tablet by mouth daily.     OVER THE COUNTER MEDICATION Take 1 capsule by mouth daily. Lion Mane Mushroom '1200mg'$      terbinafine (LAMISIL) 250 MG  tablet Please take one a day x 7days, repeat every 4 weeks x 4 months 28 tablet 0   Ubiquinol 50 MG CAPS Take by mouth daily.     VITAMIN D PO 50 mcg.     zinc sulfate 220 (50 Zn) MG capsule Take 220 mg by mouth daily.     No current facility-administered medications for this visit.    PHYSICAL EXAMINATION: ECOG PERFORMANCE STATUS: 1 - Symptomatic but completely ambulatory  Vitals:   03/15/22 1450  BP: 127/72  Pulse: 62  Resp: 13  Temp: 97.7 F (36.5 C)  SpO2: 100%   Filed Weights   03/15/22 1450  Weight: 181 lb 6.4 oz (82.3 kg)      LABORATORY DATA:  I have reviewed the data as listed    Latest Ref Rng & Units 02/16/2022    8:48 AM 11/23/2021    8:30 AM 10/26/2021    8:47 AM  CMP  Glucose 70 - 99 mg/dL 106  121  113   BUN 6 - 20 mg/dL '16  17  22   '$ Creatinine 0.44 - 1.00 mg/dL 0.84  0.80  0.92   Sodium 135 - 145 mmol/L 142  142  142   Potassium 3.5 - 5.1 mmol/L 3.7  3.5  3.9   Chloride 98 - 111 mmol/L 106  108  107   CO2 22 - 32 mmol/L '30  28  29   '$ Calcium 8.9 - 10.3 mg/dL 9.9  9.5  9.6   Total Protein 6.5 - 8.1 g/dL 7.0  7.2  6.8   Total Bilirubin 0.3 - 1.2 mg/dL 0.3  0.3  0.4   Alkaline Phos 38 - 126 U/L 77  88  65   AST 15 - 41 U/L '18  17  19   '$ ALT 0 - 44 U/L '24  22  31     '$ Lab Results  Component Value Date   WBC 6.2 02/16/2022   HGB 13.6 02/16/2022   HCT 40.2 02/16/2022   MCV 92.8 02/16/2022   PLT 219 02/16/2022   NEUTROABS 3.7 02/16/2022    ASSESSMENT & PLAN:  Malignant neoplasm of lower-outer quadrant of right breast of female, estrogen receptor positive ([place]) Metastatic Breast cancer ER/PR and Her 2 Positive: S/p NAC with TCHP and BCS  Current Treatment: HP maintenance with Anastrozole and Xgeva switched to letrozole 06/08/2021, Perjeta discontinued from 11/23/2021.  Now on Herceptin maintenance   Toxicities:  1.  Joint pains and stiffness:on letrozole 2. hot flashes 3. Diarrhea from Perjeta: Manageable, we discontinued Perjeta.   Continue with  Herceptin maintenance monthly along with letrozole We will also move her Zometa to every 6 months.   Liver lesion: PET/CT 03/14/2022: No evidence of hypermetabolic metastatic disease in the chest abdomen or pelvis.  Cystic lesions within the liver without any hypermetabolism, scattered sclerotic lesions treated bone metastases. Based on these findings we do not see that the liver lesions are metastatic sites and therefore we will watch and monitor them.  Itching of the skin of forearms and joint pain: I instructed her to stop letrozole for 3 weeks and when she returns back for her infusion of Herceptin we can make a decision as to whether or not we can continue with letrozole or switch her to another medication.   No orders of the defined types were placed in this encounter.  The patient has a good understanding of the overall plan. she agrees with it. she will call with any problems that may develop before the next visit here. Total time spent: 30 mins including face to face time and time spent for planning, charting and co-ordination of care   Harriette Ohara, MD 03/15/22    I Gardiner Coins am acting as a Education administrator for [employer]  I have reviewed the above documentation for accuracy and completeness, and I agree with the above.

## 2022-03-17 ENCOUNTER — Telehealth: Payer: Self-pay | Admitting: Hematology and Oncology

## 2022-03-17 NOTE — Telephone Encounter (Signed)
Scheduled appointment per 1/29 los. Patient is aware of the made appointment.

## 2022-03-25 ENCOUNTER — Telehealth: Payer: Self-pay | Admitting: *Deleted

## 2022-03-25 NOTE — Telephone Encounter (Signed)
Received call from pt stating she tested positive for Covid 03/24/22.  Pt denies fever at this time and only complaint it clear nasal drainage and sinus congestion.  MD out of office.  Pt educated on OTC mucinex DM to help with sinus congestion and to f/u with PCP.  Pt verbalized understanding.

## 2022-04-02 ENCOUNTER — Telehealth: Payer: Self-pay | Admitting: Hematology and Oncology

## 2022-04-02 NOTE — Telephone Encounter (Signed)
Scheduled appointment per staff message. Patient is aware of the changes made to her upcoming appointments.

## 2022-04-07 NOTE — Progress Notes (Incomplete)
Patient Care Team: Pcp, No as PCP - General [name], MD as Consulting Physician (Obstetrics and Gynecology) Jovita Kussmaul, MD as Consulting Physician (General Surgery) Gery Pray, MD as Consulting Physician (Radiation Oncology) Larey Dresser, MD as Consulting Physician (Cardiology) Nicholas Lose, MD as Medical Oncologist (Hematology and Oncology)  DIAGNOSIS: No diagnosis found.  SUMMARY OF ONCOLOGIC HISTORY: Oncology History  Malignant neoplasm of lower-outer quadrant of right breast of female, estrogen receptor positive ([place])  03/22/2016 Surgery   left lumpectomy 03/22/2016 showing atypical lobular hyperplasia prophylactic tamoxifen taken from 04/2016 through 03/2019   06/06/2019 Genetic Testing   Negative genetic testing:  No pathogenic variants detected on the [facility] panel, ordered by Dr. Helane Rima at [facility]. Two variants of uncertain significance (VUS) were detected - one in the BRIP1 gene called c.2863A>C and a second in the NBN gene called c.643C>T. The report date is 06/06/2019.  The [facility] gene panel offered by [employer] includes sequencing and deletion/duplication testing of the following 35 genes: APC, ATM, AXIN2, BARD1, BMPR1A, BRCA1, BRCA2, BRIP1, CDH1, CDK4, CDKN2A, CHEK2, EPCAM (large rearrangement only), HOXB13 (sequencing only), GALNT12, MLH1, MSH2, MSH3 (excluding repetitive portions of exon 1), MSH6, MUTYH, NBN, NTHL1, PALB2, PMS2, PTEN, RAD51C, RAD51D, RNF43, RPS20, SMAD4, STK11, and TP53. Sequencing was performed for select regions of POLE and POLD1, and large rearrangement analysis was performed for select regions of GREM1.     06/25/2020 Relapse/Recurrence   right breast lower outer quadrant biopsy 06/25/2020 shows a clinical T2 N1, stage IB invasive ductal carcinoma, grade 2 or 3, triple positive, with an MIB-1 of 40%   07/02/2020 Cancer Staging   Staging form: Breast, AJCC 8th Edition - Clinical stage  from 07/02/2020: Stage IV (cT2, cN1, cM1, G3, ER+, PR+, HER2+) - Signed by Chauncey Cruel, MD on 08/04/2020 Stage prefix: Initial diagnosis Histologic grading system: 3 grade system Laterality: Right Staged by: Pathologist and managing physician Stage used in treatment planning: Yes National guidelines used in treatment planning: Yes Type of national guideline used in treatment planning: NCCN   07/16/2020 - 09/30/2020 Chemotherapy   neoadjuvant chemotherapy consisting of docetaxel, carboplatin, trastuzumab and pertuzumab every 21 days x 4 cycles started 07/28/2020, last chemotherapy 09/30/2020       08/01/2020 PET scan   PET scan 08/01/2020 shows no hypermetabolism in the 1.2 cm right liver lesion; bony hypermetabolism consistent with known mets   08/19/2020 - 10/21/2020 Chemotherapy   Patient is on Treatment Plan : BREAST  Trastuzumab + Pertuzumab q21d      10/07/2020 Imaging    total spinal MRI 10/07/2020 shows stable to improved disease, no extraosseous extension, pathologic fracture, epidural or intracanalicular involvement MRI of the breast 10/19/2020 shows a complete imaging response in the breast, with new ring-enhancing lesions in the left breast with MRI guided biopsy 11/10/2020 showing no evidence of malignancy   11/11/2020 - 01/18/2022 Chemotherapy   Patient is on Treatment Plan : BREAST Trastuzumab + Pertuzumab q21d     11/11/2020 -  Chemotherapy   Patient is on Treatment Plan : BREAST Trastuzumab IV (8/6) or SQ (600) D1 q21d     01/02/2021 Surgery   right lumpectomy with no sentinel lymph node sampling 01/02/2021 showed scattered foci of residual invasive ductal carcinoma, grade 2, the largest measuring 0.8 cm.  Margins were negative   01/02/2021 -  Anti-estrogen oral therapy   anastrozole started 01/12/2021   Malignant neoplasm metastatic to bone ([place])  08/04/2020 Initial Diagnosis  Bone metastases ([place])   11/11/2020 - 01/18/2022 Chemotherapy   Patient is on Treatment Plan  : BREAST Trastuzumab + Pertuzumab q21d     11/11/2020 -  Chemotherapy   Patient is on Treatment Plan : BREAST Trastuzumab IV (8/6) or SQ (600) D1 q21d       CHIEF COMPLIANT: metastatic breast cancer/Herceptin maintenance  INTERVAL HISTORY: Kim Archer is a 57 y.o. with above-mentioned history of metastatic breast cancer is currently on Herceptin and Perjeta maintenance. She presents to the clinic today for a follow-up.     ALLERGIES:  is allergic to ciprofloxacin, ciprofibrate, gabapentin, sulfa antibiotics, and venlafaxine.  MEDICATIONS:  Current Outpatient Medications  Medication Sig Dispense Refill   Biotin 5 MG CAPS Take 1 capsule (5 mg total) by mouth daily.  0   Boron 3 MG CAPS Take by mouth.     Calcium 500 MG tablet Take 600 mg by mouth.     ibuprofen (ADVIL) 800 MG tablet ibuprofen 800 mg tablet  TAKE 1 TABLET BY MOUTH THREE TIMES A DAY     ipratropium (ATROVENT) 0.06 % nasal spray Place into both nostrils.     letrozole (FEMARA) 2.5 MG tablet Take 1 tablet (2.5 mg total) by mouth daily. 90 tablet 3   lidocaine-prilocaine (EMLA) cream Apply topically daily.     loratadine (CLARITIN) 10 MG tablet Take 10 mg by mouth daily.     LUTEIN PO Take by mouth.     MAGNESIUM PO Take 400 mg by mouth at bedtime.     Menatetrenone (VITAMIN K2) 100 MCG TABS Take by mouth.     mometasone (ELOCON) 0.1 % cream Apply 1 Application topically daily.     Multiple Vitamin (MULTIVITAMIN WITH MINERALS) TABS tablet Take 1 tablet by mouth daily.     OVER THE COUNTER MEDICATION Take 1 capsule by mouth daily. Lion Mane Mushroom '1200mg'$      terbinafine (LAMISIL) 250 MG tablet Please take one a day x 7days, repeat every 4 weeks x 4 months 28 tablet 0   Ubiquinol 50 MG CAPS Take by mouth daily.     VITAMIN D PO 50 mcg.     zinc sulfate 220 (50 Zn) MG capsule Take 220 mg by mouth daily.     No current facility-administered medications for this visit.    PHYSICAL EXAMINATION: ECOG PERFORMANCE  STATUS: {CHL ONC ECOG PS:[id number]}  There were no vitals filed for this visit. There were no vitals filed for this visit.  BREAST:*** No palpable masses or nodules in either right or left breasts. No palpable axillary supraclavicular or infraclavicular adenopathy no breast tenderness or nipple discharge. (exam performed in the presence of a chaperone)  LABORATORY DATA:  I have reviewed the data as listed    Latest Ref Rng & Units 02/16/2022    8:48 AM 11/23/2021    8:30 AM 10/26/2021    8:47 AM  CMP  Glucose 70 - 99 mg/dL 106  121  113   BUN 6 - 20 mg/dL '16  17  22   '$ Creatinine 0.44 - 1.00 mg/dL 0.84  0.80  0.92   Sodium 135 - 145 mmol/L 142  142  142   Potassium 3.5 - 5.1 mmol/L 3.7  3.5  3.9   Chloride 98 - 111 mmol/L 106  108  107   CO2 22 - 32 mmol/L '30  28  29   '$ Calcium 8.9 - 10.3 mg/dL 9.9  9.5  9.6   Total Protein 6.5 -  8.1 g/dL 7.0  7.2  6.8   Total Bilirubin 0.3 - 1.2 mg/dL 0.3  0.3  0.4   Alkaline Phos 38 - 126 U/L 77  88  65   AST 15 - 41 U/L '18  17  19   '$ ALT 0 - 44 U/L '24  22  31     '$ Lab Results  Component Value Date   WBC 6.2 02/16/2022   HGB 13.6 02/16/2022   HCT 40.2 02/16/2022   MCV 92.8 02/16/2022   PLT 219 02/16/2022   NEUTROABS 3.7 02/16/2022    ASSESSMENT & PLAN:  No problem-specific Assessment & Plan notes found for this encounter.    No orders of the defined types were placed in this encounter.  The patient has a good understanding of the overall plan. she agrees with it. she will call with any problems that may develop before the next visit here. Total time spent: 30 mins including face to face time and time spent for planning, charting and co-ordination of care   Suzzette Righter, [place] 04/07/22    I Gardiner Coins am acting as a Education administrator for [employer]  ***

## 2022-04-08 ENCOUNTER — Ambulatory Visit (INDEPENDENT_AMBULATORY_CARE_PROVIDER_SITE_OTHER): Payer: Commercial Managed Care - PPO

## 2022-04-08 DIAGNOSIS — B351 Tinea unguium: Secondary | ICD-10-CM

## 2022-04-08 NOTE — Progress Notes (Signed)
Patient presents today for the 2nd laser treatment. Diagnosed with mycotic nail infection by Dr. Paulla Dolly.   Toenail most affected Bilateral hallux.  All other systems are negative.  Nails were filed thin. Laser therapy was administered to nails on bilateral hallux and patient tolerated the treatment well. All safety precautions were in place.   Patient was prescribed lamisil 247m pulse dose. Patient reports taking as directed.   Single laser pass was done on non-affected nails.   Follow up in 4 weeks for laser # 2.

## 2022-04-09 ENCOUNTER — Telehealth: Payer: Self-pay | Admitting: *Deleted

## 2022-04-09 ENCOUNTER — Telehealth: Payer: Self-pay | Admitting: Hematology and Oncology

## 2022-04-09 NOTE — Telephone Encounter (Signed)
Rescheduled appointment per 2/23 secure chat. Patient is aware of the changes made to her upcoming appointment. Patient was given the main line for infusion and was informed that she will need to call infusion once outside so the nurse can come get her. Patient was given [phone]

## 2022-04-09 NOTE — Telephone Encounter (Signed)
Received call from pt with complaint of green nasal drainage.  Pt states she thinks she has a sinus infection post Covid.  RN educated pt that MD is out of office until next week and she will need to reach out to PCP.  Pt states she does not have a PCP, RN encouraged pt to be seen by urgent care for further evaluation.  Pt verbalized understanding.

## 2022-04-12 ENCOUNTER — Inpatient Hospital Stay: Payer: Commercial Managed Care - PPO | Admitting: Hematology and Oncology

## 2022-04-12 ENCOUNTER — Other Ambulatory Visit: Payer: Self-pay | Admitting: Hematology and Oncology

## 2022-04-12 ENCOUNTER — Inpatient Hospital Stay: Payer: Commercial Managed Care - PPO

## 2022-04-12 ENCOUNTER — Inpatient Hospital Stay: Payer: Commercial Managed Care - PPO | Attending: Hematology and Oncology | Admitting: Hematology and Oncology

## 2022-04-12 VITALS — BP 119/56 | HR 69 | Temp 99.8°F | Resp 17

## 2022-04-12 DIAGNOSIS — Z79899 Other long term (current) drug therapy: Secondary | ICD-10-CM | POA: Insufficient documentation

## 2022-04-12 DIAGNOSIS — C7951 Secondary malignant neoplasm of bone: Secondary | ICD-10-CM | POA: Insufficient documentation

## 2022-04-12 DIAGNOSIS — Z5112 Encounter for antineoplastic immunotherapy: Secondary | ICD-10-CM | POA: Diagnosis present

## 2022-04-12 DIAGNOSIS — C50511 Malignant neoplasm of lower-outer quadrant of right female breast: Secondary | ICD-10-CM | POA: Diagnosis not present

## 2022-04-12 DIAGNOSIS — Z79811 Long term (current) use of aromatase inhibitors: Secondary | ICD-10-CM | POA: Diagnosis not present

## 2022-04-12 DIAGNOSIS — Z170 Estrogen receptor positive status [ER+]: Secondary | ICD-10-CM

## 2022-04-12 MED ORDER — HEPARIN SOD (PORK) LOCK FLUSH 100 UNIT/ML IV SOLN: 500.0000 [IU] | Freq: Once | INTRAVENOUS | Status: DC | PRN

## 2022-04-12 MED ORDER — DIPHENHYDRAMINE HCL 25 MG PO CAPS: 50.0000 mg | ORAL_CAPSULE | Freq: Once | ORAL | Status: DC

## 2022-04-12 MED ORDER — SODIUM CHLORIDE 0.9 % IV SOLN: INTRAVENOUS | Status: DC

## 2022-04-12 MED ORDER — TRASTUZUMAB-ANNS CHEMO 150 MG IV SOLR
6.0000 mg/kg | Freq: Once | INTRAVENOUS | Status: AC
  Administered 2022-04-12: 483 mg via INTRAVENOUS
  Filled 2022-04-12: qty 23

## 2022-04-12 MED ORDER — ACETAMINOPHEN 325 MG PO TABS: 650.0000 mg | ORAL_TABLET | Freq: Once | ORAL | Status: DC

## 2022-04-12 MED ORDER — SODIUM CHLORIDE 0.9% FLUSH: 10.0000 mL | INTRAVENOUS | Status: DC | PRN

## 2022-04-12 MED ORDER — AMOXICILLIN-POT CLAVULANATE 875-125 MG PO TABS: 1.0000 | ORAL_TABLET | Freq: Two times a day (BID) | ORAL | 0 refills | Status: DC

## 2022-04-12 NOTE — Assessment & Plan Note (Signed)
Metastatic Breast cancer ER/PR and Her 2 Positive: S/p NAC with TCHP and BCS   Current Treatment: HP maintenance with Anastrozole and Xgeva switched to letrozole 06/08/2021, Perjeta discontinued from 11/23/2021.  Now on Herceptin maintenance   Toxicities:  1.  Joint pains and stiffness:on letrozole 2. hot flashes 3. Diarrhea from Perjeta: Manageable, we discontinued Perjeta.   Continue with Herceptin maintenance monthly along with letrozole We will also move her Zometa to every 6 months.   Liver lesion: PET/CT 03/14/2022: No evidence of hypermetabolic metastatic disease in the chest abdomen or pelvis.  Cystic lesions within the liver without any hypermetabolism, scattered sclerotic lesions treated bone metastases. Based on these findings we do not see that the liver lesions are metastatic sites and therefore we will watch and monitor them.   Itching of the skin of forearms and joint pain: I instructed her to stop letrozole for 3 weeks and when she returns back for her infusion of Herceptin we can make a decision as to whether or not we can continue with letrozole or switch her to another medication.

## 2022-04-12 NOTE — Progress Notes (Signed)
Patient reported she has been feeling dizzy, lightheaded and confused the past few days. She did not go to the PCP or urgent care to get checked out for a sinus infection. Today she states she is having short term memory loss. She took tylenol at home, then couldn't recall taking it so she took another dose. It is not clear the amount of tylenol taken prior to admission to infusion. Patient also states she is very nauseous.   Dr. Lindi Adie evaluated patient in the infusion room. 1 L IVF over an hour ordered and patient agrees to continue with treatment today.

## 2022-04-12 NOTE — Progress Notes (Signed)
Patient Care Team: Pcp, No as PCP - General [name], MD as Consulting Physician (Obstetrics and Gynecology) Jovita Kussmaul, MD as Consulting Physician (General Surgery) Gery Pray, MD as Consulting Physician (Radiation Oncology) Larey Dresser, MD as Consulting Physician (Cardiology) Nicholas Lose, MD as Medical Oncologist (Hematology and Oncology)  DIAGNOSIS:  Encounter Diagnosis  Name Primary?   Malignant neoplasm of lower-outer quadrant of right breast of female, estrogen receptor positive ([place]) Yes    SUMMARY OF ONCOLOGIC HISTORY: Oncology History  Malignant neoplasm of lower-outer quadrant of right breast of female, estrogen receptor positive ([place])  03/22/2016 Surgery   left lumpectomy 03/22/2016 showing atypical lobular hyperplasia prophylactic tamoxifen taken from 04/2016 through 03/2019   06/06/2019 Genetic Testing   Negative genetic testing:  No pathogenic variants detected on the [facility] panel, ordered by Dr. Helane Rima at [facility]. Two variants of uncertain significance (VUS) were detected - one in the BRIP1 gene called c.2863A>C and a second in the NBN gene called c.643C>T. The report date is 06/06/2019.  The [facility] gene panel offered by [employer] includes sequencing and deletion/duplication testing of the following 35 genes: APC, ATM, AXIN2, BARD1, BMPR1A, BRCA1, BRCA2, BRIP1, CDH1, CDK4, CDKN2A, CHEK2, EPCAM (large rearrangement only), HOXB13 (sequencing only), GALNT12, MLH1, MSH2, MSH3 (excluding repetitive portions of exon 1), MSH6, MUTYH, NBN, NTHL1, PALB2, PMS2, PTEN, RAD51C, RAD51D, RNF43, RPS20, SMAD4, STK11, and TP53. Sequencing was performed for select regions of POLE and POLD1, and large rearrangement analysis was performed for select regions of GREM1.     06/25/2020 Relapse/Recurrence   right breast lower outer quadrant biopsy 06/25/2020 shows a clinical T2 N1, stage IB invasive ductal carcinoma, grade 2 or  3, triple positive, with an MIB-1 of 40%   07/02/2020 Cancer Staging   Staging form: Breast, AJCC 8th Edition - Clinical stage from 07/02/2020: Stage IV (cT2, cN1, cM1, G3, ER+, PR+, HER2+) - Signed by Chauncey Cruel, MD on 08/04/2020 Stage prefix: Initial diagnosis Histologic grading system: 3 grade system Laterality: Right Staged by: Pathologist and managing physician Stage used in treatment planning: Yes National guidelines used in treatment planning: Yes Type of national guideline used in treatment planning: NCCN   07/16/2020 - 09/30/2020 Chemotherapy   neoadjuvant chemotherapy consisting of docetaxel, carboplatin, trastuzumab and pertuzumab every 21 days x 4 cycles started 07/28/2020, last chemotherapy 09/30/2020       08/01/2020 PET scan   PET scan 08/01/2020 shows no hypermetabolism in the 1.2 cm right liver lesion; bony hypermetabolism consistent with known mets   08/19/2020 - 10/21/2020 Chemotherapy   Patient is on Treatment Plan : BREAST  Trastuzumab + Pertuzumab q21d      10/07/2020 Imaging    total spinal MRI 10/07/2020 shows stable to improved disease, no extraosseous extension, pathologic fracture, epidural or intracanalicular involvement MRI of the breast 10/19/2020 shows a complete imaging response in the breast, with new ring-enhancing lesions in the left breast with MRI guided biopsy 11/10/2020 showing no evidence of malignancy   11/11/2020 - 01/18/2022 Chemotherapy   Patient is on Treatment Plan : BREAST Trastuzumab + Pertuzumab q21d     11/11/2020 -  Chemotherapy   Patient is on Treatment Plan : BREAST Trastuzumab IV (8/6) or SQ (600) D1 q21d     01/02/2021 Surgery   right lumpectomy with no sentinel lymph node sampling 01/02/2021 showed scattered foci of residual invasive ductal carcinoma, grade 2, the largest measuring 0.8 cm.  Margins were negative   01/02/2021 -  Anti-estrogen oral therapy   anastrozole started 01/12/2021   Malignant neoplasm metastatic to bone  ([place])  08/04/2020 Initial Diagnosis   Bone metastases ([place])   11/11/2020 - 01/18/2022 Chemotherapy   Patient is on Treatment Plan : BREAST Trastuzumab + Pertuzumab q21d     11/11/2020 -  Chemotherapy   Patient is on Treatment Plan : BREAST Trastuzumab IV (8/6) or SQ (600) D1 q21d       CHIEF COMPLIANT: metastatic breast cancer   INTERVAL HISTORY: Kim Archer is a 57 y.o. with above-mentioned history of metastatic breast cancer is currently on Herceptin and Perjeta maintenance. She presents to the clinic today for a follow-up.  Since she was diagnosed with COVID-19 infection recently she has been feeling pretty poorly.  She did have fevers and chills which have improved significantly.  She does not feel too well today and feels dizzy lightheaded as well.   ALLERGIES:  is allergic to ciprofloxacin, ciprofibrate, gabapentin, sulfa antibiotics, and venlafaxine.  MEDICATIONS:  Current Outpatient Medications  Medication Sig Dispense Refill   amoxicillin-clavulanate (AUGMENTIN) 875-125 MG tablet Take 1 tablet by mouth 2 (two) times daily. 14 tablet 0   Biotin 5 MG CAPS Take 1 capsule (5 mg total) by mouth daily.  0   Boron 3 MG CAPS Take by mouth.     Calcium 500 MG tablet Take 600 mg by mouth.     ibuprofen (ADVIL) 800 MG tablet ibuprofen 800 mg tablet  TAKE 1 TABLET BY MOUTH THREE TIMES A DAY     ipratropium (ATROVENT) 0.06 % nasal spray Place into both nostrils.     letrozole (FEMARA) 2.5 MG tablet Take 1 tablet (2.5 mg total) by mouth daily. 90 tablet 3   lidocaine-prilocaine (EMLA) cream Apply topically daily.     loratadine (CLARITIN) 10 MG tablet Take 10 mg by mouth daily.     LUTEIN PO Take by mouth.     MAGNESIUM PO Take 400 mg by mouth at bedtime.     Menatetrenone (VITAMIN K2) 100 MCG TABS Take by mouth.     mometasone (ELOCON) 0.1 % cream Apply 1 Application topically daily.     Multiple Vitamin (MULTIVITAMIN WITH MINERALS) TABS tablet Take 1 tablet by mouth daily.      OVER THE COUNTER MEDICATION Take 1 capsule by mouth daily. Lion Mane Mushroom '1200mg'$      terbinafine (LAMISIL) 250 MG tablet Please take one a day x 7days, repeat every 4 weeks x 4 months 28 tablet 0   Ubiquinol 50 MG CAPS Take by mouth daily.     VITAMIN D PO 50 mcg.     zinc sulfate 220 (50 Zn) MG capsule Take 220 mg by mouth daily.     No current facility-administered medications for this visit.   Facility-Administered Medications Ordered in Other Visits  Medication Dose Route Frequency Provider Last Rate Last Admin   0.9 %  sodium chloride infusion   Intravenous Continuous Nicholas Lose, MD 999 mL/hr at 04/12/22 1506 New Bag at 04/12/22 1506   acetaminophen (TYLENOL) tablet 650 mg  650 mg Oral Once Nicholas Lose, MD       diphenhydrAMINE (BENADRYL) capsule 50 mg  50 mg Oral Once Nicholas Lose, MD       heparin lock flush 100 unit/mL  500 Units Intracatheter Once PRN Nicholas Lose, MD       sodium chloride flush (NS) 0.9 % injection 10 mL  10 mL Intracatheter PRN Nicholas Lose, MD  PHYSICAL EXAMINATION: ECOG PERFORMANCE STATUS: 2 - Symptomatic, <50% confined to bed  There were no vitals filed for this visit. There were no vitals filed for this visit.    LABORATORY DATA:  I have reviewed the data as listed    Latest Ref Rng & Units 02/16/2022    8:48 AM 11/23/2021    8:30 AM 10/26/2021    8:47 AM  CMP  Glucose 70 - 99 mg/dL 106  121  113   BUN 6 - 20 mg/dL '16  17  22   '$ Creatinine 0.44 - 1.00 mg/dL 0.84  0.80  0.92   Sodium 135 - 145 mmol/L 142  142  142   Potassium 3.5 - 5.1 mmol/L 3.7  3.5  3.9   Chloride 98 - 111 mmol/L 106  108  107   CO2 22 - 32 mmol/L '30  28  29   '$ Calcium 8.9 - 10.3 mg/dL 9.9  9.5  9.6   Total Protein 6.5 - 8.1 g/dL 7.0  7.2  6.8   Total Bilirubin 0.3 - 1.2 mg/dL 0.3  0.3  0.4   Alkaline Phos 38 - 126 U/L 77  88  65   AST 15 - 41 U/L '18  17  19   '$ ALT 0 - 44 U/L '24  22  31     '$ Lab Results  Component Value Date   WBC 6.2 02/16/2022   HGB 13.6  02/16/2022   HCT 40.2 02/16/2022   MCV 92.8 02/16/2022   PLT 219 02/16/2022   NEUTROABS 3.7 02/16/2022    ASSESSMENT & PLAN:  Malignant neoplasm of 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  Now on Herceptin maintenance   Toxicities:  1.  Joint pains and stiffness:on letrozole 2. hot flashes 3. Diarrhea from Perjeta: Manageable, we discontinued Perjeta.   Continue with Herceptin maintenance monthly along with letrozole We will also move her Zometa to every 6 months.   Liver lesion: PET/CT 03/14/2022: No evidence of hypermetabolic metastatic disease in the chest abdomen or pelvis.  Cystic lesions within the liver without any hypermetabolism, scattered sclerotic lesions treated bone metastases. Based on these findings we do not see that the liver lesions are metastatic sites and therefore we will watch and monitor them.   Itching of the skin of forearms and joint pain: Much improved since she stopped her letrozole. When we meet her back in 3 weeks we will discuss starting anastrozole.  COVID-19 infection: I believe that she has a superinfection with bacterial sinusitis.  I sent a prescription for Augmentin.      No orders of the defined types were placed in this encounter.  The patient has a good understanding of the overall plan. she agrees with it. she will call with any problems that may develop before the next visit here. Total time spent: 30 mins including face to face time and time spent for planning, charting and co-ordination of care   Harriette Ohara, MD 04/12/22    I Gardiner Coins am acting as a Education administrator for [employer]  I have reviewed the above documentation for accuracy and completeness, and I agree with the  above.

## 2022-04-14 ENCOUNTER — Other Ambulatory Visit: Payer: Self-pay | Admitting: Hematology and Oncology

## 2022-04-22 ENCOUNTER — Telehealth: Payer: Self-pay | Admitting: Hematology and Oncology

## 2022-04-22 NOTE — Telephone Encounter (Signed)
Scheduled appointments per WQ. Patient is aware of the made appointments.

## 2022-04-26 ENCOUNTER — Other Ambulatory Visit: Payer: Self-pay | Admitting: Hematology and Oncology

## 2022-04-26 DIAGNOSIS — C50511 Malignant neoplasm of lower-outer quadrant of right female breast: Secondary | ICD-10-CM

## 2022-04-27 ENCOUNTER — Other Ambulatory Visit: Payer: Self-pay | Admitting: Hematology and Oncology

## 2022-05-06 ENCOUNTER — Ambulatory Visit (INDEPENDENT_AMBULATORY_CARE_PROVIDER_SITE_OTHER): Payer: Commercial Managed Care - PPO

## 2022-05-06 DIAGNOSIS — B351 Tinea unguium: Secondary | ICD-10-CM

## 2022-05-06 NOTE — Progress Notes (Signed)
Patient presents today for the 3 laser treatment. Diagnosed with mycotic nail infection by Dr. Paulla Dolly.   Toenail most affected Bilateral hallux.  All other systems are negative.  Nails were filed thin. Laser therapy was administered to 1-5 toenails bilateral  and patient tolerated the treatment well. All safety precautions were in place.   Single laser pass was done on non-affected nails.   Follow up in 6 weeks for laser # 4.

## 2022-05-11 ENCOUNTER — Other Ambulatory Visit: Payer: Self-pay

## 2022-05-11 ENCOUNTER — Telehealth: Payer: Self-pay | Admitting: Adult Health

## 2022-05-11 ENCOUNTER — Inpatient Hospital Stay: Payer: Commercial Managed Care - PPO | Attending: Hematology and Oncology

## 2022-05-11 ENCOUNTER — Inpatient Hospital Stay (HOSPITAL_BASED_OUTPATIENT_CLINIC_OR_DEPARTMENT_OTHER): Payer: Commercial Managed Care - PPO | Admitting: Adult Health

## 2022-05-11 ENCOUNTER — Encounter: Payer: Self-pay | Admitting: Adult Health

## 2022-05-11 VITALS — BP 122/73 | HR 59 | Resp 16

## 2022-05-11 VITALS — BP 119/60 | HR 63 | Temp 97.4°F | Resp 18 | Ht 67.0 in | Wt 175.2 lb

## 2022-05-11 DIAGNOSIS — C7951 Secondary malignant neoplasm of bone: Secondary | ICD-10-CM | POA: Insufficient documentation

## 2022-05-11 DIAGNOSIS — C50511 Malignant neoplasm of lower-outer quadrant of right female breast: Secondary | ICD-10-CM | POA: Diagnosis not present

## 2022-05-11 DIAGNOSIS — Z170 Estrogen receptor positive status [ER+]: Secondary | ICD-10-CM | POA: Insufficient documentation

## 2022-05-11 DIAGNOSIS — Z5112 Encounter for antineoplastic immunotherapy: Secondary | ICD-10-CM | POA: Diagnosis not present

## 2022-05-11 MED ORDER — SODIUM CHLORIDE 0.9 % IV SOLN: Freq: Once | INTRAVENOUS | Status: AC

## 2022-05-11 MED ORDER — TRASTUZUMAB-ANNS CHEMO 150 MG IV SOLR
6.0000 mg/kg | Freq: Once | INTRAVENOUS | Status: AC
  Administered 2022-05-11: 504 mg via INTRAVENOUS
  Filled 2022-05-11: qty 24

## 2022-05-11 MED ORDER — DIPHENHYDRAMINE HCL 25 MG PO CAPS
50.0000 mg | ORAL_CAPSULE | Freq: Once | ORAL | Status: DC
  Filled 2022-05-11: qty 2

## 2022-05-11 MED ORDER — SODIUM CHLORIDE 0.9% FLUSH
10.0000 mL | INTRAVENOUS | Status: DC | PRN
  Administered 2022-05-11: 10 mL

## 2022-05-11 MED ORDER — HEPARIN SOD (PORK) LOCK FLUSH 100 UNIT/ML IV SOLN
500.0000 [IU] | Freq: Once | INTRAVENOUS | Status: AC | PRN
  Administered 2022-05-11: 500 [IU]

## 2022-05-11 MED ORDER — ACETAMINOPHEN 325 MG PO TABS
650.0000 mg | ORAL_TABLET | Freq: Once | ORAL | Status: DC
  Filled 2022-05-11: qty 2

## 2022-05-11 MED ORDER — LIDOCAINE-PRILOCAINE 2.5-2.5 % EX CREA: TOPICAL_CREAM | CUTANEOUS | 1 refills | Status: DC | PRN

## 2022-05-11 NOTE — Patient Instructions (Signed)
Fulvestrant Injection What is this medication? FULVESTRANT (ful VES trant) treats breast cancer. It works by blocking the hormone estrogen in breast tissue, which prevents breast cancer cells from spreading or growing. This medicine may be used for other purposes; ask your health care provider or pharmacist if you have questions. COMMON BRAND NAME(S): FASLODEX What should I tell my care team before I take this medication? They need to know if you have any of these conditions: Bleeding disorder Liver disease Low blood cell levels, such as low white cells, red cells, and platelets An unusual or allergic reaction to fulvestrant, other medications, foods, dyes, or preservatives Pregnant or trying to get pregnant Breast-feeding How should I use this medication? This medication is injected into a muscle. It is given by your care team in a hospital or clinic setting. Talk to your care team about the use of this medication in children. Special care may be needed. Overdosage: If you think you have taken too much of this medicine contact a poison control center or emergency room at once. NOTE: This medicine is only for you. Do not share this medicine with others. What if I miss a dose? Keep appointments for follow-up doses. It is important not to miss your dose. Call your care team if you are unable to keep an appointment. What may interact with this medication? Certain medications that prevent or treat blood clots, such as warfarin, enoxaparin, dalteparin, apixaban, dabigatran, rivaroxaban This list may not describe all possible interactions. Give your health care provider a list of all the medicines, herbs, non-prescription drugs, or dietary supplements you use. Also tell them if you smoke, drink alcohol, or use illegal drugs. Some items may interact with your medicine. What should I watch for while using this medication? Your condition will be monitored carefully while you are receiving this  medication. You may need blood work while taking this medication. Talk to your care team if you may be pregnant. Serious birth defects can occur if you take this medication during pregnancy and for 1 year after the last dose. You will need a negative pregnancy test before starting this medication. Contraception is recommended while taking this medication and for 1 year after the last dose. Your care team can help you find the option that works for you. Do not breastfeed while taking this medication and for 1 year after the last dose. This medication may cause infertility. Talk to your care team if you are concerned about your fertility. What side effects may I notice from receiving this medication? Side effects that you should report to your care team as soon as possible: Allergic reactions or angioedema--skin rash, itching or hives, swelling of the face, eyes, lips, tongue, arms, or legs, trouble swallowing or breathing Pain, tingling, or numbness in the hands or feet Side effects that usually do not require medical attention (report to your care team if they continue or are bothersome): Bone, joint, or muscle pain Constipation Headache Hot flashes Nausea Pain, redness, or irritation at injection site Unusual weakness or fatigue This list may not describe all possible side effects. Call your doctor for medical advice about side effects. You may report side effects to FDA at 1-800-FDA-1088. Where should I keep my medication? This medication is given in a hospital or clinic. It will not be stored at home. NOTE: This sheet is a summary. It may not cover all possible information. If you have questions about this medicine, talk to your doctor, pharmacist, or health care provider.    2023 Elsevier/Gold Standard (2007-03-25 00:00:00) Exemestane Tablets What is this medication? EXEMESTANE (ex e MES tane) treats breast cancer. It works by decreasing the amount of estrogen hormone your body makes, which  slows or stops breast cancer cells from spreading or growing. This medicine may be used for other purposes; ask your health care provider or pharmacist if you have questions. COMMON BRAND NAME(S): Aromasin What should I tell my care team before I take this medication? They need to know if you have any of these conditions: An unusual or allergic reaction to exemestane, other medications, foods, dyes, or preservatives Pregnant or trying to get pregnant Breastfeeding How should I use this medication? Take this medication by mouth with a glass of water. Take it as directed on the prescription label at the same time every day. Take it after a meal. Keep taking it unless your care team tells you to stop. Talk to your care team about the use of this medication in children. Special care may be needed. Overdosage: If you think you have taken too much of this medicine contact a poison control center or emergency room at once. NOTE: This medicine is only for you. Do not share this medicine with others. What if I miss a dose? If you miss a dose, skip it. Take your next dose at the normal time. Do not take extra or 2 doses at the same time to make up for the missed dose. What may interact with this medication? Certain medications for seizures, such as carbamazepine, phenobarbital, phenytoin Rifampin St. John's wort This list may not describe all possible interactions. Give your health care provider a list of all the medicines, herbs, non-prescription drugs, or dietary supplements you use. Also tell them if you smoke, drink alcohol, or use illegal drugs. Some items may interact with your medicine. What should I watch for while using this medication? Visit your care team for regular checks on your progress. If you experience hot flashes or sweating while taking this medication, avoid alcohol, smoking, and caffeine. This may help to decrease these side effects. Using this medication for a long time may weaken  your bones. The risk of bone fractures may be increased. Talk to your care team about your bone health. Talk to your care team if you may be pregnant. Serious birth defects can occur if you take this medication during pregnancy and for 1 month after the last dose. You will need a negative pregnancy test before starting this medication. Contraception is recommended while taking this medication and for 1 month after the last dose. Your care team can help you find the option that works for you. Do not breastfeed while taking this medication and for 1 month after the last dose. This medication may cause infertility. Talk to your care team if you are concerned about your fertility. What side effects may I notice from receiving this medication? Side effects that you should report to your care team as soon as possible: Allergic reactions--skin rash, itching, hives, swelling of the face, lips, tongue, or throat Side effects that usually do not require medical attention (report to your care team if they continue or are bothersome): Fatigue Headache Hot flashes Joint pain Nausea Sweating Trouble sleeping This list may not describe all possible side effects. Call your doctor for medical advice about side effects. You may report side effects to FDA at 1-800-FDA-1088. Where should I keep my medication? Keep out of the reach of children and pets. Store at room temperature between  20 and 25 degrees C (68 and 77 degrees F). Get rid of any unused medication after the expiration date. To get rid of medications that are no longer needed or have expired: Take the medication to a medication take-back program. Check with your pharmacy or law enforcement to find a location. If you cannot return the medication, ask your pharmacist or care team how to get rid of this medication safely. NOTE: This sheet is a summary. It may not cover all possible information. If you have questions about this medicine, talk to your  doctor, pharmacist, or health care provider.  2023 Elsevier/Gold Standard (2021-06-24 00:00:00)

## 2022-05-11 NOTE — Assessment & Plan Note (Signed)
Kim Archer is a 57 year old woman with stage IV breast cancer here today for follow-up and evaluation prior to receiving Herceptin therapy.  She continues on Herceptin every 4 weeks and receive Zometa every 6 months.  Toxicities: Difficulty with arthralgias on letrozole and anastrozole.  Gave her information on exemestane and Faslodex today we will touch base with Dr. Lindi Adie about which one he prefers.  Echo due 07/2022  Her Zometa is due today however she did not have a CBC and c-Met drawn.  We will postpone this to 4 weeks from now and I have added orders for CBC c-Met so we can evaluate her calcium level prior to administering the Zometa.  RTC in 4 weeks for labs, f/u, and Herceptin/Zometa.

## 2022-05-11 NOTE — Progress Notes (Signed)
Per Wilber Bihari, NP, okay to treat with Echo from 01/20/22.

## 2022-05-11 NOTE — Progress Notes (Signed)
Per patient, she does not take benadryl with treatment and took tylenol prior to appointment time.

## 2022-05-11 NOTE — Patient Instructions (Signed)
[facility]  Discharge Instructions: Thank you for choosing [facility] to provide your oncology and hematology care.   If you have a lab appointment with the Cancer Center, please go directly to the Cancer Center and check in at the registration area.   Wear comfortable clothing and clothing appropriate for easy access to any Portacath or PICC line.   We strive to give you quality time with your provider. You may need to reschedule your appointment if you arrive late (15 or more minutes).  Arriving late affects you and other patients whose appointments are after yours.  Also, if you miss three or more appointments without notifying the office, you may be dismissed from the clinic at the provider's discretion.      For prescription refill requests, have your pharmacy contact our office and allow 72 hours for refills to be completed.    Today you received the following chemotherapy and/or immunotherapy agent: Trastuzumab (Kanjinti)   To help prevent nausea and vomiting after your treatment, we encourage you to take your nausea medication as directed.  BELOW ARE SYMPTOMS THAT SHOULD BE REPORTED IMMEDIATELY: *FEVER GREATER THAN 100.4 F (38 C) OR HIGHER *CHILLS OR SWEATING *NAUSEA AND VOMITING THAT IS NOT CONTROLLED WITH YOUR NAUSEA MEDICATION *UNUSUAL SHORTNESS OF BREATH *UNUSUAL BRUISING OR BLEEDING *URINARY PROBLEMS (pain or burning when urinating, or frequent urination) *BOWEL PROBLEMS (unusual diarrhea, constipation, pain near the anus) TENDERNESS IN MOUTH AND THROAT WITH OR WITHOUT PRESENCE OF ULCERS (sore throat, sores in mouth, or a toothache) UNUSUAL RASH, SWELLING OR PAIN  UNUSUAL VAGINAL DISCHARGE OR ITCHING   Items with * indicate a potential emergency and should be followed up as soon as possible or go to the Emergency Department if any problems should occur.  Please show the CHEMOTHERAPY ALERT CARD or IMMUNOTHERAPY ALERT CARD  at check-in to the Emergency Department and triage nurse.  Should you have questions after your visit or need to cancel or reschedule your appointment, please contact [facility]  Dept: [phone]  and follow the prompts.  Office hours are 8:00 a.m. to 4:30 p.m. Monday - Friday. Please note that voicemails left after 4:00 p.m. may not be returned until the following business day.  We are closed weekends and major holidays. You have access to a nurse at all times for urgent questions. Please call the main number to the clinic Dept: [phone] and follow the prompts.   For any non-urgent questions, you may also contact your provider using MyChart. We now offer e-Visits for anyone 18 and older to request care online for non-urgent symptoms. For details visit [email or web address].   Also download the MyChart app! Go to the app store, search "MyChart", open the app, select , and log in with your MyChart username and password.  Masks are optional in the cancer centers. If you would like for your care team to wear a mask while they are taking care of you, please let them know. You may have one support person who is at least 58 years old accompany you for your appointments. Trastuzumab Injection What is this medication? TRASTUZUMAB (tras TOO zoo mab) treats breast cancer and stomach cancer. It works by blocking a protein that causes cancer cells to grow and multiply. This helps to slow or stop the spread of cancer cells. This medicine may be used for other purposes; ask your health care provider or pharmacist if you have   questions. COMMON BRAND NAME(S): Herceptin, Herzuma, KANJINTI, Ogivri, Ontruzant, Trazimera What should I tell my care team before I take this medication? They need to know if you have any of these conditions: Heart failure Lung disease An unusual or allergic reaction to trastuzumab, other medications, foods, dyes, or  preservatives Pregnant or trying to get pregnant Breast-feeding How should I use this medication? This medication is injected into a vein. It is given by your care team in a hospital or clinic setting. Talk to your care team about the use of this medication in children. It is not approved for use in children. Overdosage: If you think you have taken too much of this medicine contact a poison control center or emergency room at once. NOTE: This medicine is only for you. Do not share this medicine with others. What if I miss a dose? Keep appointments for follow-up doses. It is important not to miss your dose. Call your care team if you are unable to keep an appointment. What may interact with this medication? Certain types of chemotherapy, such as daunorubicin, doxorubicin, epirubicin, idarubicin This list may not describe all possible interactions. Give your health care provider a list of all the medicines, herbs, non-prescription drugs, or dietary supplements you use. Also tell them if you smoke, drink alcohol, or use illegal drugs. Some items may interact with your medicine. What should I watch for while using this medication? Your condition will be monitored carefully while you are receiving this medication. This medication may make you feel generally unwell. This is not uncommon, as chemotherapy affects healthy cells as well as cancer cells. Report any side effects. Continue your course of treatment even though you feel ill unless your care team tells you to stop. This medication may increase your risk of getting an infection. Call your care team for advice if you get a fever, chills, sore throat, or other symptoms of a cold or flu. Do not treat yourself. Try to avoid being around people who are sick. Avoid taking medications that contain aspirin, acetaminophen, ibuprofen, naproxen, or ketoprofen unless instructed by your care team. These medications can hide a fever. Talk to your care team if  you may be pregnant. Serious birth defects can occur if you take this medication during pregnancy and for 7 months after the last dose. You will need a negative pregnancy test before starting this medication. Contraception is recommended while taking this medication and for 7 months after the last dose. Your care team can help you find the option that works for you. Do not breastfeed while taking this medication and for 7 months after stopping treatment. What side effects may I notice from receiving this medication? Side effects that you should report to your care team as soon as possible: Allergic reactions or angioedema--skin rash, itching or hives, swelling of the face, eyes, lips, tongue, arms, or legs, trouble swallowing or breathing Dry cough, shortness of breath or trouble breathing Heart failure--shortness of breath, swelling of the ankles, feet, or hands, sudden weight gain, unusual weakness or fatigue Infection--fever, chills, cough, or sore throat Infusion reactions--chest pain, shortness of breath or trouble breathing, feeling faint or lightheaded Side effects that usually do not require medical attention (report to your care team if they continue or are bothersome): Diarrhea Dizziness Headache Nausea Trouble sleeping Vomiting This list may not describe all possible side effects. Call your doctor for medical advice about side effects. You may report side effects to FDA at 1-800-FDA-1088. Where should I keep   my medication? This medication is given in a hospital or clinic. It will not be stored at home. NOTE: This sheet is a summary. It may not cover all possible information. If you have questions about this medicine, talk to your doctor, pharmacist, or health care provider.  2023 Elsevier/Gold Standard (2021-06-04 00:00:00) 

## 2022-05-11 NOTE — Progress Notes (Signed)
[facility] [place] [facility] Follow up:    Pcp, No No address on file   DIAGNOSIS:  Cancer Staging  Malignant neoplasm of lower-outer quadrant of right breast of female, estrogen receptor positive ([place]) Staging form: Breast, AJCC 8th Edition - Clinical stage from 07/02/2020: Stage IV (cT2, cN1, cM1, G3, ER+, PR+, HER2+) - Signed by Chauncey Cruel, MD on 08/04/2020 Stage prefix: Initial diagnosis Histologic grading system: 3 grade system Laterality: Right Staged by: Pathologist and managing physician Stage used in treatment planning: Yes National guidelines used in treatment planning: Yes Type of national guideline used in treatment planning: NCCN   SUMMARY OF ONCOLOGIC HISTORY: Oncology History  Malignant neoplasm of lower-outer quadrant of right breast of female, estrogen receptor positive ([place])  03/22/2016 Surgery   left lumpectomy 03/22/2016 showing atypical lobular hyperplasia prophylactic tamoxifen taken from 04/2016 through 03/2019   06/06/2019 Genetic Testing   Negative genetic testing:  No pathogenic variants detected on the [facility] panel, ordered by Dr. Helane Rima at [facility]. Two variants of uncertain significance (VUS) were detected - one in the BRIP1 gene called c.2863A>C and a second in the NBN gene called c.643C>T. The report date is 06/06/2019.  The [facility] gene panel offered by [employer] includes sequencing and deletion/duplication testing of the following 35 genes: APC, ATM, AXIN2, BARD1, BMPR1A, BRCA1, BRCA2, BRIP1, CDH1, CDK4, CDKN2A, CHEK2, EPCAM (large rearrangement only), HOXB13 (sequencing only), GALNT12, MLH1, MSH2, MSH3 (excluding repetitive portions of exon 1), MSH6, MUTYH, NBN, NTHL1, PALB2, PMS2, PTEN, RAD51C, RAD51D, RNF43, RPS20, SMAD4, STK11, and TP53. Sequencing was performed for select regions of POLE and POLD1, and large rearrangement analysis was performed for select regions of GREM1.     06/25/2020  Relapse/Recurrence   right breast lower outer quadrant biopsy 06/25/2020 shows a clinical T2 N1, stage IB invasive ductal carcinoma, grade 2 or 3, triple positive, with an MIB-1 of 40%   07/02/2020 Cancer Staging   Staging form: Breast, AJCC 8th Edition - Clinical stage from 07/02/2020: Stage IV (cT2, cN1, cM1, G3, ER+, PR+, HER2+) - Signed by Chauncey Cruel, MD on 08/04/2020 Stage prefix: Initial diagnosis Histologic grading system: 3 grade system Laterality: Right Staged by: Pathologist and managing physician Stage used in treatment planning: Yes National guidelines used in treatment planning: Yes Type of national guideline used in treatment planning: NCCN   07/16/2020 - 09/30/2020 Chemotherapy   neoadjuvant chemotherapy consisting of docetaxel, carboplatin, trastuzumab and pertuzumab every 21 days x 4 cycles started 07/28/2020, last chemotherapy 09/30/2020       08/01/2020 PET scan   PET scan 08/01/2020 shows no hypermetabolism in the 1.2 cm right liver lesion; bony hypermetabolism consistent with known mets   08/19/2020 - 10/21/2020 Chemotherapy   Patient is on Treatment Plan : BREAST  Trastuzumab + Pertuzumab q21d      10/07/2020 Imaging    total spinal MRI 10/07/2020 shows stable to improved disease, no extraosseous extension, pathologic fracture, epidural or intracanalicular involvement MRI of the breast 10/19/2020 shows a complete imaging response in the breast, with new ring-enhancing lesions in the left breast with MRI guided biopsy 11/10/2020 showing no evidence of malignancy   11/11/2020 - 01/18/2022 Chemotherapy   Patient is on Treatment Plan : BREAST Trastuzumab + Pertuzumab q21d     11/11/2020 - 04/12/2022 Chemotherapy   Patient is on Treatment Plan : BREAST Trastuzumab IV (8/6) or SQ (600) D1 q21d     01/02/2021 Surgery   right lumpectomy with no sentinel  lymph node sampling 01/02/2021 showed scattered foci of residual invasive ductal carcinoma, grade 2, the largest measuring  0.8 cm.  Margins were negative   01/02/2021 -  Anti-estrogen oral therapy   anastrozole started 01/12/2021   05/11/2022 -  Chemotherapy   Patient is on Treatment Plan : BREAST MAINTENANCE Trastuzumab IV (6) or SQ (600) D1 q28d x 13 cycles     Malignant neoplasm metastatic to bone ([place])  08/04/2020 Initial Diagnosis   Bone metastases ([place])   11/11/2020 - 01/18/2022 Chemotherapy   Patient is on Treatment Plan : BREAST Trastuzumab + Pertuzumab q21d     11/11/2020 - 04/12/2022 Chemotherapy   Patient is on Treatment Plan : BREAST Trastuzumab IV (8/6) or SQ (600) D1 q21d       CURRENT THERAPY: Herceptin  INTERVAL HISTORY: Kim Archer 57 y.o. female returns for follow-up of her metastatic breast cancer.  She continues on Herceptin given every 4 weeks with good tolerance.  She had a friend who is taking fulvestrant and she wonders rather than starting oral antiestrogen therapy if this is potentially an option for her since it may perhaps cause arthralgias.  The arthralgias in her knees from the letrozole really impacted her quality of life and she is feeling much better since being off of that.  She notes she was on anastrozole prior and had the same side effects.    Her most recent echocardiogram occurred in December 2023 demonstrating a left ventricular ejection fraction of 55 to 60%.  We are obtaining echocardiograms every 6 months on her due to her long-term stability on the Herceptin.   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  MEDICAL HISTORY: Past Medical History:  Diagnosis Date   Allergy    Anemia    due to heavy periods   Atypical ductal hyperplasia of left breast    History of kidney stones    HSV-2 (herpes simplex virus 2) infection    PONV (postoperative nausea and vomiting)    right breast ca 06/2020   Breast cancer    SURGICAL HISTORY: Past Surgical History:  Procedure Laterality Date   BREAST CYST EXCISION Left 03/22/2016   ALH-BENIGN   BREAST LUMPECTOMY WITH RADIOACTIVE SEED LOCALIZATION Left 03/22/2016   Procedure: LEFT BREAST LUMPECTOMY WITH RADIOACTIVE SEED LOCALIZATION;  Surgeon: Autumn Messing III, MD;  Location: [place];  Service: General;  Laterality: Left;   BREAST LUMPECTOMY WITH RADIOACTIVE SEED LOCALIZATION Right 01/02/2021   Procedure: RIGHT BREAST LUMPECTOMY WITH RADIOACTIVE SEED LOCALIZATION X2;  Surgeon: Jovita Kussmaul, MD;  Location: [place];  Service: General;  Laterality: Right;   CESAREAN SECTION     x3   COMBINED HYSTEROSCOPY DIAGNOSTIC / D&C  07/17/2018   EYE SURGERY     lt lens replacement   PORTACATH PLACEMENT Left 07/07/2020   Procedure: INSERTION PORT-A-CATH;  Surgeon: Jovita Kussmaul, MD;  Location: [place];  Service: General;  Laterality: Left;  TUBAL LIGATION      SOCIAL HISTORY: Social History   Socioeconomic History   Marital status: Married    Spouse name: Not on file   Number of children: Not on file   Years of education: Not on file   Highest education level: Not on file  Occupational History   Not on file  Tobacco Use   Smoking status: Never   Smokeless tobacco: Never  Vaping Use   Vaping Use: Never used  Substance and Sexual Activity   Alcohol use: No   Drug use: No   Sexual activity: Not on file    Comment: tubal ligation  Other Topics Concern   Not on file  Social  History Narrative   Not on file   Social Determinants of Health   Financial Resource Strain: Low Risk  (07/02/2020)   Overall Financial Resource Strain (CARDIA)    Difficulty of Paying Living Expenses: Not very hard  Food Insecurity: No Food Insecurity (07/02/2020)   Hunger Vital Sign    Worried About Running Out of Food in the Last Year: Never true    [place] in the Last Year: Never true  Transportation Needs: No Transportation Needs (07/02/2020)   PRAPARE - Hydrologist (Medical): No    Lack of Transportation (Non-Medical): No  Physical Activity: Not on file  Stress: Not on file  Social Connections: Not on file  Intimate Partner Violence: Not on file    FAMILY HISTORY: Family History  Problem Relation Age of Onset   Breast cancer Mother    Melanoma Father    Leukemia Sister    Liver cancer Brother        stomach   Breast cancer Maternal Aunt    Colon cancer Neg Hx    Colon polyps Neg Hx    Esophageal cancer Neg Hx    Rectal cancer Neg Hx    Stomach cancer Neg Hx     Review of Systems  Constitutional:  Negative for appetite change, chills, fatigue, fever and unexpected weight change.  HENT:   Negative for hearing loss, lump/mass and trouble swallowing.   Eyes:  Negative for eye problems and icterus.  Respiratory:  Negative for chest tightness, cough and shortness of breath.   Cardiovascular:  Negative for chest pain, leg swelling and palpitations.  Gastrointestinal:  Negative for abdominal distention, abdominal pain, constipation, diarrhea, nausea and vomiting.  Endocrine: Negative for hot flashes.  Genitourinary:  Negative for difficulty urinating.   Musculoskeletal:  Negative for arthralgias.  Skin:  Negative for itching and rash.  Neurological:  Negative for dizziness, extremity weakness, headaches and numbness.  Hematological:  Negative for adenopathy. Does not bruise/bleed easily.  Psychiatric/Behavioral:  Negative for  depression. The patient is not nervous/anxious.       PHYSICAL EXAMINATION  ECOG PERFORMANCE STATUS: 0 - Asymptomatic  Vitals:   05/11/22 1443  BP: 119/60  Pulse: 63  Resp: 18  Temp: (!) 97.4 F (36.3 C)  SpO2: 99%    Physical Exam Constitutional:      General: She is not in acute distress.    Appearance: Normal appearance. She is not toxic-appearing.  HENT:     Head: Normocephalic and atraumatic.  Eyes:     General: No scleral icterus. Cardiovascular:     Rate and Rhythm: Normal rate and regular rhythm.     Pulses: Normal pulses.     Heart sounds: Normal heart sounds.  Pulmonary:  Effort: Pulmonary effort is normal.     Breath sounds: Normal breath sounds.  Abdominal:     General: Abdomen is flat. Bowel sounds are normal. There is no distension.     Palpations: Abdomen is soft.     Tenderness: There is no abdominal tenderness.  Musculoskeletal:        General: No swelling.     Cervical back: Neck supple.  Lymphadenopathy:     Cervical: No cervical adenopathy.  Skin:    General: Skin is warm and dry.     Findings: No rash.  Neurological:     General: No focal deficit present.     Mental Status: She is alert.  Psychiatric:        Mood and Affect: Mood normal.        Behavior: Behavior normal.     LABORATORY DATA:  CBC    Component Value Date/Time   WBC 6.2 02/16/2022 0848   WBC 5.4 05/18/2021 0838   RBC 4.33 02/16/2022 0848   HGB 13.6 02/16/2022 0848   HGB 12.7 05/11/2016 1508   HCT 40.2 02/16/2022 0848   HCT 38.9 05/11/2016 1508   PLT 219 02/16/2022 0848   PLT 244 05/11/2016 1508   MCV 92.8 02/16/2022 0848   MCV 88.9 05/11/2016 1508   MCH 31.4 02/16/2022 0848   MCHC 33.8 02/16/2022 0848   RDW 12.3 02/16/2022 0848   RDW 16.8 (H) 05/11/2016 1508   LYMPHSABS 2.1 02/16/2022 0848   LYMPHSABS 2.7 05/11/2016 1508   MONOABS 0.3 02/16/2022 0848   MONOABS 0.5 05/11/2016 1508   EOSABS 0.1 02/16/2022 0848   EOSABS 0.2 05/11/2016 1508   BASOSABS  0.0 02/16/2022 0848   BASOSABS 0.0 05/11/2016 1508    CMP     Component Value Date/Time   NA 142 02/16/2022 0848   NA 142 05/11/2016 1508   K 3.7 02/16/2022 0848   K 4.3 05/11/2016 1508   CL 106 02/16/2022 0848   CO2 30 02/16/2022 0848   CO2 26 05/11/2016 1508   GLUCOSE 106 (H) 02/16/2022 0848   GLUCOSE 90 05/11/2016 1508   BUN 16 02/16/2022 0848   BUN 10.9 05/11/2016 1508   CREATININE 0.84 02/16/2022 0848   CREATININE 1.0 05/11/2016 1508   CALCIUM 9.9 02/16/2022 0848   CALCIUM 9.5 05/11/2016 1508   PROT 7.0 02/16/2022 0848   PROT 7.0 05/11/2016 1508   ALBUMIN 4.2 02/16/2022 0848   ALBUMIN 3.9 05/11/2016 1508   AST 18 02/16/2022 0848   AST 19 05/11/2016 1508   ALT 24 02/16/2022 0848   ALT 23 05/11/2016 1508   ALKPHOS 77 02/16/2022 0848   ALKPHOS 98 05/11/2016 1508   BILITOT 0.3 02/16/2022 0848   BILITOT 0.36 05/11/2016 1508   GFRNONAA >60 02/16/2022 0848    ASSESSMENT and THERAPY PLAN:   Malignant neoplasm of lower-outer quadrant of right breast of female, estrogen receptor positive ([place]) Kim Archer is a 57 year old woman with stage IV breast cancer here today for follow-up and evaluation prior to receiving Herceptin therapy.  She continues on Herceptin every 4 weeks and receive Zometa every 6 months.  Toxicities: Difficulty with arthralgias on letrozole and anastrozole.  Gave her information on exemestane and Faslodex today we will touch base with Dr. Lindi Adie about which one he prefers.  Echo due 07/2022  Her Zometa is due today however she did not have a CBC and c-Met drawn.  We will postpone this to 4 weeks from now and I have added orders for  CBC c-Met so we can evaluate her calcium level prior to administering the Zometa.  RTC in 4 weeks for labs, f/u, and Herceptin/Zometa.   All questions were answered. The patient knows to call the clinic with any problems, questions or concerns. We can certainly see the patient much sooner if necessary.  Total encounter  time:30 minutes*in face-to-face visit time, chart review, lab review, care coordination, order entry, and documentation of the encounter time.  Wilber Bihari, NP 05/11/22 4:20 PM Medical Oncology and Hematology [facility] [place], [place] [address] Tel. [phone]    Fax. [phone]  *Total Encounter Time as defined by the Centers for Medicare and Medicaid Services includes, in addition to the face-to-face time of a patient visit (documented in the note above) non-face-to-face time: obtaining and reviewing outside history, ordering and reviewing medications, tests or procedures, care coordination (communications with other health care professionals or caregivers) and documentation in the medical record.

## 2022-05-11 NOTE — Telephone Encounter (Signed)
Scheduled appointment per 3/26 los. Patient is aware of the made appointment.

## 2022-05-18 ENCOUNTER — Other Ambulatory Visit: Payer: Self-pay

## 2022-06-04 ENCOUNTER — Other Ambulatory Visit: Payer: Self-pay | Admitting: Hematology and Oncology

## 2022-06-04 NOTE — Progress Notes (Signed)
Patient Care Team: Pcp, No as PCP - General Marcelle Overlie, MD as Consulting Physician (Obstetrics and Gynecology) Griselda Miner, MD as Consulting Physician (General Surgery) Antony Blackbird, MD as Consulting Physician (Radiation Oncology) Laurey Morale, MD as Consulting Physician (Cardiology) Serena Croissant, MD as Medical Oncologist (Hematology and Oncology)  DIAGNOSIS: No diagnosis found.  SUMMARY OF ONCOLOGIC HISTORY: Oncology History  Malignant neoplasm of lower-outer quadrant of right breast of female, estrogen receptor positive  03/22/2016 Surgery   left lumpectomy 03/22/2016 showing atypical lobular hyperplasia prophylactic tamoxifen taken from 04/2016 through 03/2019   06/06/2019 Genetic Testing   Negative genetic testing:  No pathogenic variants detected on the [facility] panel, ordered by Dr. Vincente Poli at [facility]. Two variants of uncertain significance (VUS) were detected - one in the BRIP1 gene called c.2863A>C and a second in the NBN gene called c.643C>T. The report date is 06/06/2019.  The [facility] gene panel offered by [employer] includes sequencing and deletion/duplication testing of the following 35 genes: APC, ATM, AXIN2, BARD1, BMPR1A, BRCA1, BRCA2, BRIP1, CDH1, CDK4, CDKN2A, CHEK2, EPCAM (large rearrangement only), HOXB13 (sequencing only), GALNT12, MLH1, MSH2, MSH3 (excluding repetitive portions of exon 1), MSH6, MUTYH, NBN, NTHL1, PALB2, PMS2, PTEN, RAD51C, RAD51D, RNF43, RPS20, SMAD4, STK11, and TP53. Sequencing was performed for select regions of POLE and POLD1, and large rearrangement analysis was performed for select regions of GREM1.     06/25/2020 Relapse/Recurrence   right breast lower outer quadrant biopsy 06/25/2020 shows a clinical T2 N1, stage IB invasive ductal carcinoma, grade 2 or 3, triple positive, with an MIB-1 of 40%   07/02/2020 Cancer Staging   Staging form: Breast, AJCC 8th Edition - Clinical stage from  07/02/2020: Stage IV (cT2, cN1, cM1, G3, ER+, PR+, HER2+) - Signed by Lowella Dell, MD on 08/04/2020 Stage prefix: Initial diagnosis Histologic grading system: 3 grade system Laterality: Right Staged by: Pathologist and managing physician Stage used in treatment planning: Yes National guidelines used in treatment planning: Yes Type of national guideline used in treatment planning: NCCN   07/16/2020 - 09/30/2020 Chemotherapy   neoadjuvant chemotherapy consisting of docetaxel, carboplatin, trastuzumab and pertuzumab every 21 days x 4 cycles started 07/28/2020, last chemotherapy 09/30/2020       08/01/2020 PET scan   PET scan 08/01/2020 shows no hypermetabolism in the 1.2 cm right liver lesion; bony hypermetabolism consistent with known mets   08/19/2020 - 10/21/2020 Chemotherapy   Patient is on Treatment Plan : BREAST  Trastuzumab + Pertuzumab q21d      10/07/2020 Imaging    total spinal MRI 10/07/2020 shows stable to improved disease, no extraosseous extension, pathologic fracture, epidural or intracanalicular involvement MRI of the breast 10/19/2020 shows a complete imaging response in the breast, with new ring-enhancing lesions in the left breast with MRI guided biopsy 11/10/2020 showing no evidence of malignancy   11/11/2020 - 01/18/2022 Chemotherapy   Patient is on Treatment Plan : BREAST Trastuzumab + Pertuzumab q21d     11/11/2020 - 04/12/2022 Chemotherapy   Patient is on Treatment Plan : BREAST Trastuzumab IV (8/6) or SQ (600) D1 q21d     01/02/2021 Surgery   right lumpectomy with no sentinel lymph node sampling 01/02/2021 showed scattered foci of residual invasive ductal carcinoma, grade 2, the largest measuring 0.8 cm.  Margins were negative   01/02/2021 -  Anti-estrogen oral therapy   anastrozole started 01/12/2021   05/11/2022 -  Chemotherapy   Patient is on Treatment Plan :  BREAST MAINTENANCE Trastuzumab IV (6) or SQ (600) D1 q28d x 13 cycles     Malignant neoplasm metastatic  to bone  08/04/2020 Initial Diagnosis   Bone metastases ([facility])   11/11/2020 - 01/18/2022 Chemotherapy   Patient is on Treatment Plan : BREAST Trastuzumab + Pertuzumab q21d     11/11/2020 - 04/12/2022 Chemotherapy   Patient is on Treatment Plan : BREAST Trastuzumab IV (8/6) or SQ (600) D1 q21d       CHIEF COMPLIANT:  metastatic breast cancer/ letrozole  INTERVAL HISTORY: Kim Archer is a 57 y.o. with above-mentioned history of metastatic breast cancer is currently on Herceptin and Perjeta maintenance. She presents to the clinic today for a follow-up.    ALLERGIES:  is allergic to ciprofloxacin, ciprofibrate, gabapentin, sulfa antibiotics, and venlafaxine.  MEDICATIONS:  Current Outpatient Medications  Medication Sig Dispense Refill   benzonatate (TESSALON) 200 MG capsule TAKE 1 CAPSULE (200 MG TOTAL) BY MOUTH 3 (THREE) TIMES DAILY AS NEEDED FOR COUGH. (Patient not taking: Reported on 05/11/2022) 20 capsule 0   Biotin 5 MG CAPS Take 1 capsule (5 mg total) by mouth daily.  0   Boron 3 MG CAPS Take by mouth.     Calcium 500 MG tablet Take 600 mg by mouth.     ibuprofen (ADVIL) 800 MG tablet ibuprofen 800 mg tablet  TAKE 1 TABLET BY MOUTH THREE TIMES A DAY (Patient not taking: Reported on 05/11/2022)     ipratropium (ATROVENT) 0.06 % nasal spray Place into both nostrils.     letrozole (FEMARA) 2.5 MG tablet TAKE 1 TABLET BY MOUTH EVERY DAY 90 tablet 3   lidocaine-prilocaine (EMLA) cream Apply topically daily.     lidocaine-prilocaine (EMLA) cream Apply topically continuous as needed. 30 g 1   loratadine (CLARITIN) 10 MG tablet Take 10 mg by mouth daily.     LUTEIN PO Take by mouth.     MAGNESIUM PO Take 400 mg by mouth at bedtime.     Menatetrenone (VITAMIN K2) 100 MCG TABS Take by mouth.     mometasone (ELOCON) 0.1 % cream Apply 1 Application topically daily. (Patient not taking: Reported on 05/11/2022)     Multiple Vitamin (MULTIVITAMIN WITH MINERALS) TABS tablet Take 1 tablet by mouth  daily.     OVER THE COUNTER MEDICATION Take 1 capsule by mouth daily. [place] Mushroom 1200mg      terbinafine (LAMISIL) 250 MG tablet Please take one a day x 7days, repeat every 4 weeks x 4 months 28 tablet 0   Ubiquinol 50 MG CAPS Take by mouth daily.     VITAMIN D PO 50 mcg.     zinc sulfate 220 (50 Zn) MG capsule Take 220 mg by mouth daily.     No current facility-administered medications for this visit.    PHYSICAL EXAMINATION: ECOG PERFORMANCE STATUS: {CHL ONC ECOG [phone]}  There were no vitals filed for this visit. There were no vitals filed for this visit.  BREAST:*** No palpable masses or nodules in either right or left breasts. No palpable axillary supraclavicular or infraclavicular adenopathy no breast tenderness or nipple discharge. (exam performed in the presence of a chaperone)  LABORATORY DATA:  I have reviewed the data as listed    Latest Ref Rng & Units 02/16/2022    8:48 AM 11/23/2021    8:30 AM 10/26/2021    8:47 AM  CMP  Glucose 70 - 99 mg/dL 366  440  347   BUN 6 - 20  mg/dL Creatinine 0.44 - 1.00 mg/dL 4.69  6.29  5.28   Sodium 135 - 145 mmol/L 142  142  142   Potassium 3.5 - 5.1 mmol/L 3.7  3.5  3.9   Chloride 98 - 111 mmol/L 106  108  107   CO2 22 - 32 mmol/L Calcium 8.9 - 10.3 mg/dL 9.9  9.5  9.6   Total Protein 6.5 - 8.1 g/dL 7.0  7.2  6.8   Total Bilirubin 0.3 - 1.2 mg/dL 0.3  0.3  0.4   Alkaline Phos 38 - 126 U/L 77  88  65   AST 15 - 41 U/L ALT 0 - 44 U/L Lab Results  Component Value Date   WBC 6.2 02/16/2022   HGB 13.6 02/16/2022   HCT 40.2 02/16/2022   MCV 92.8 02/16/2022   PLT 219 02/16/2022   NEUTROABS 3.7 02/16/2022    ASSESSMENT & PLAN:  No problem-specific Assessment & Plan notes found for this encounter.    No orders of the defined types were placed in this encounter.  The patient has a good understanding of the overall plan. she agrees with it. she will call with  any problems that may develop before the next visit here. Total time spent: 30 mins including face to face time and time spent for planning, charting and co-ordination of care   Sherlyn Lick, CMA 06/04/22    I Janan Ridge am acting as a Neurosurgeon for [employer]  ***

## 2022-06-07 ENCOUNTER — Inpatient Hospital Stay: Payer: Commercial Managed Care - PPO | Attending: Hematology and Oncology

## 2022-06-07 ENCOUNTER — Inpatient Hospital Stay: Payer: Commercial Managed Care - PPO

## 2022-06-07 ENCOUNTER — Other Ambulatory Visit: Payer: Self-pay | Admitting: *Deleted

## 2022-06-07 ENCOUNTER — Inpatient Hospital Stay (HOSPITAL_BASED_OUTPATIENT_CLINIC_OR_DEPARTMENT_OTHER): Payer: Commercial Managed Care - PPO | Admitting: Hematology and Oncology

## 2022-06-07 ENCOUNTER — Other Ambulatory Visit: Payer: Self-pay

## 2022-06-07 VITALS — BP 127/61 | HR 62 | Temp 97.8°F | Resp 18 | Ht 67.0 in | Wt 179.6 lb

## 2022-06-07 VITALS — BP 131/73 | HR 50 | Temp 98.4°F | Resp 18

## 2022-06-07 DIAGNOSIS — C7951 Secondary malignant neoplasm of bone: Secondary | ICD-10-CM | POA: Diagnosis not present

## 2022-06-07 DIAGNOSIS — C50511 Malignant neoplasm of lower-outer quadrant of right female breast: Secondary | ICD-10-CM | POA: Diagnosis not present

## 2022-06-07 DIAGNOSIS — Z5181 Encounter for therapeutic drug level monitoring: Secondary | ICD-10-CM

## 2022-06-07 DIAGNOSIS — Z95828 Presence of other vascular implants and grafts: Secondary | ICD-10-CM

## 2022-06-07 DIAGNOSIS — Z5112 Encounter for antineoplastic immunotherapy: Secondary | ICD-10-CM | POA: Diagnosis present

## 2022-06-07 DIAGNOSIS — Z7189 Other specified counseling: Secondary | ICD-10-CM

## 2022-06-07 DIAGNOSIS — Z79811 Long term (current) use of aromatase inhibitors: Secondary | ICD-10-CM | POA: Insufficient documentation

## 2022-06-07 DIAGNOSIS — M899 Disorder of bone, unspecified: Secondary | ICD-10-CM

## 2022-06-07 DIAGNOSIS — Z170 Estrogen receptor positive status [ER+]: Secondary | ICD-10-CM | POA: Insufficient documentation

## 2022-06-07 LAB — CMP (CANCER CENTER ONLY)
ALT: 20 U/L (ref 0–44)
AST: 19 U/L (ref 15–41)
Albumin: 4.5 g/dL (ref 3.5–5.0)
Alkaline Phosphatase: 105 U/L (ref 38–126)
Anion gap: 7 (ref 5–15)
BUN: 15 mg/dL (ref 6–20)
CO2: 29 mmol/L (ref 22–32)
Calcium: 10 mg/dL (ref 8.9–10.3)
Chloride: 105 mmol/L (ref 98–111)
Creatinine: 0.82 mg/dL (ref 0.44–1.00)
GFR, Estimated: >60 mL/min (ref >60)
Glucose, Bld: 93 mg/dL (ref 70–99)
Potassium: 4.2 mmol/L (ref 3.5–5.1)
Sodium: 141 mmol/L (ref 135–145)
Total Bilirubin: 0.4 mg/dL (ref 0.3–1.2)
Total Protein: 7.4 g/dL (ref 6.5–8.1)

## 2022-06-07 LAB — CBC WITH DIFFERENTIAL (CANCER CENTER ONLY)
Abs Immature Granulocytes: 0.02 10*3/uL (ref 0.00–0.07)
Basophils Absolute: 0 10*3/uL (ref 0.0–0.1)
Basophils Relative: 0 %
Eosinophils Absolute: 0.2 10*3/uL (ref 0.0–0.5)
Eosinophils Relative: 2 %
HCT: 40.1 % (ref 36.0–46.0)
Hemoglobin: 13.2 g/dL (ref 12.0–15.0)
Immature Granulocytes: 0 %
Lymphocytes Relative: 40 %
Lymphs Abs: 2.8 10*3/uL (ref 0.7–4.0)
MCH: 31.4 pg (ref 26.0–34.0)
MCHC: 32.9 g/dL (ref 30.0–36.0)
MCV: 95.5 fL (ref 80.0–100.0)
Monocytes Absolute: 0.4 10*3/uL (ref 0.1–1.0)
Monocytes Relative: 6 %
Neutro Abs: 3.6 10*3/uL (ref 1.7–7.7)
Neutrophils Relative %: 52 %
Platelet Count: 230 10*3/uL (ref 150–400)
RBC: 4.2 MIL/uL (ref 3.87–5.11)
RDW: 13 % (ref 11.5–15.5)
WBC Count: 6.9 10*3/uL (ref 4.0–10.5)
nRBC: 0 % (ref 0.0–0.2)

## 2022-06-07 LAB — MAGNESIUM: Magnesium: 2.3 mg/dL (ref 1.7–2.4)

## 2022-06-07 MED ORDER — SODIUM CHLORIDE 0.9% FLUSH
10.0000 mL | Freq: Once | INTRAVENOUS | Status: AC
  Administered 2022-06-07: 10 mL

## 2022-06-07 MED ORDER — ZOLEDRONIC ACID 4 MG/100ML IV SOLN
4.0000 mg | Freq: Once | INTRAVENOUS | Status: AC
  Administered 2022-06-07: 4 mg via INTRAVENOUS
  Filled 2022-06-07: qty 100

## 2022-06-07 MED ORDER — SODIUM CHLORIDE 0.9% FLUSH
10.0000 mL | INTRAVENOUS | Status: DC | PRN
  Administered 2022-06-07: 10 mL

## 2022-06-07 MED ORDER — SODIUM CHLORIDE 0.9 % IV SOLN: Freq: Once | INTRAVENOUS | Status: AC

## 2022-06-07 MED ORDER — TRASTUZUMAB-ANNS CHEMO 150 MG IV SOLR
6.0000 mg/kg | Freq: Once | INTRAVENOUS | Status: AC
  Administered 2022-06-07: 504 mg via INTRAVENOUS
  Filled 2022-06-07: qty 24

## 2022-06-07 MED ORDER — EXEMESTANE 25 MG PO TABS: 25.0000 mg | ORAL_TABLET | Freq: Every day | ORAL | 3 refills | Status: DC

## 2022-06-07 MED ORDER — HEPARIN SOD (PORK) LOCK FLUSH 100 UNIT/ML IV SOLN
500.0000 [IU] | Freq: Once | INTRAVENOUS | Status: AC | PRN
  Administered 2022-06-07: 500 [IU]

## 2022-06-07 NOTE — Patient Instructions (Signed)
[facility]  Discharge Instructions: Thank you for choosing [facility] to provide your oncology and hematology care.   If you have a lab appointment with the Cancer Center, please go directly to the Cancer Center and check in at the registration area.   Wear comfortable clothing and clothing appropriate for easy access to any Portacath or PICC line.   We strive to give you quality time with your provider. You may need to reschedule your appointment if you arrive late (15 or more minutes).  Arriving late affects you and other patients whose appointments are after yours.  Also, if you miss three or more appointments without notifying the office, you may be dismissed from the clinic at the provider's discretion.      For prescription refill requests, have your pharmacy contact our office and allow 72 hours for refills to be completed.    Today you received the following chemotherapy and/or immunotherapy agents: Kanjinti.      To help prevent nausea and vomiting after your treatment, we encourage you to take your nausea medication as directed.  BELOW ARE SYMPTOMS THAT SHOULD BE REPORTED IMMEDIATELY: *FEVER GREATER THAN 100.4 F (38 C) OR HIGHER *CHILLS OR SWEATING *NAUSEA AND VOMITING THAT IS NOT CONTROLLED WITH YOUR NAUSEA MEDICATION *UNUSUAL SHORTNESS OF BREATH *UNUSUAL BRUISING OR BLEEDING *URINARY PROBLEMS (pain or burning when urinating, or frequent urination) *BOWEL PROBLEMS (unusual diarrhea, constipation, pain near the anus) TENDERNESS IN MOUTH AND THROAT WITH OR WITHOUT PRESENCE OF ULCERS (sore throat, sores in mouth, or a toothache) UNUSUAL RASH, SWELLING OR PAIN  UNUSUAL VAGINAL DISCHARGE OR ITCHING   Items with * indicate a potential emergency and should be followed up as soon as possible or go to the Emergency Department if any problems should occur.  Please show the CHEMOTHERAPY ALERT CARD or IMMUNOTHERAPY ALERT CARD at  check-in to the Emergency Department and triage nurse.  Should you have questions after your visit or need to cancel or reschedule your appointment, please contact [facility]  Dept: [phone]  and follow the prompts.  Office hours are 8:00 a.m. to 4:30 p.m. Monday - Friday. Please note that voicemails left after 4:00 p.m. may not be returned until the following business day.  We are closed weekends and major holidays. You have access to a nurse at all times for urgent questions. Please call the main number to the clinic Dept: [phone] and follow the prompts.   For any non-urgent questions, you may also contact your provider using MyChart. We now offer e-Visits for anyone 18 and older to request care online for non-urgent symptoms. For details visit [email or web address].   Also download the MyChart app! Go to the app store, search "MyChart", open the app, select , and log in with your MyChart username and password.   

## 2022-06-07 NOTE — Assessment & Plan Note (Addendum)
Metastatic Breast cancer ER/PR and Her 2 Positive: S/p NAC with TCHP and BCS   Current Treatment: HP maintenance with Anastrozole and Xgeva switched to letrozole 06/08/2021, Perjeta discontinued from 11/23/2021.  Now on Herceptin maintenance   Toxicities:  1.  Joint pains and stiffness:on letrozole 2. hot flashes 3. Diarrhea from Perjeta: Resolved since he started Perjeta  Continue with Herceptin maintenance monthly   We will also move her Zometa to every 6 months.   Liver lesion: PET/CT 03/14/2022: No evidence of hypermetabolic metastatic disease in the chest abdomen or pelvis.  Cystic lesions within the liver without any hypermetabolism, scattered sclerotic lesions treated bone metastases. Based on these findings we do not see that the liver lesions are metastatic sites and therefore we will watch and monitor them.   Itching of the skin of forearms and joint pain: Much improved since she stopped her letrozole. I sent a prescription for exemestane today.   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  Follow-up in 3 weeks for Herceptin and to assess how she is tolerating exemestane.

## 2022-06-07 NOTE — Progress Notes (Signed)
Per MD okay to treat with echo from 01/20/22, pt to receive echo every 6 months.

## 2022-06-07 NOTE — Progress Notes (Signed)
Patient states she took her pre-med tylenol at home around 1400. Pt also states doesn't take benadryl.

## 2022-06-08 ENCOUNTER — Ambulatory Visit (HOSPITAL_COMMUNITY)
Admission: RE | Admit: 2022-06-08 | Discharge: 2022-06-08 | Disposition: A | Discharge status: Home / Self Care | Payer: Commercial Managed Care - PPO | Source: Ambulatory Visit | Attending: Hematology and Oncology | Admitting: Hematology and Oncology

## 2022-06-08 DIAGNOSIS — Z170 Estrogen receptor positive status [ER+]: Secondary | ICD-10-CM | POA: Insufficient documentation

## 2022-06-08 DIAGNOSIS — C50511 Malignant neoplasm of lower-outer quadrant of right female breast: Secondary | ICD-10-CM

## 2022-06-08 MED ORDER — GADOBUTROL 1 MMOL/ML IV SOLN
7.0000 mL | Freq: Once | INTRAVENOUS | Status: AC | PRN
  Administered 2022-06-08: 7 mL via INTRAVENOUS

## 2022-06-09 ENCOUNTER — Encounter: Payer: Self-pay | Admitting: Adult Health

## 2022-06-10 ENCOUNTER — Other Ambulatory Visit: Payer: Self-pay

## 2022-06-11 ENCOUNTER — Other Ambulatory Visit: Payer: Commercial Managed Care - PPO

## 2022-06-18 ENCOUNTER — Ambulatory Visit (INDEPENDENT_AMBULATORY_CARE_PROVIDER_SITE_OTHER): Payer: Commercial Managed Care - PPO

## 2022-06-18 DIAGNOSIS — B351 Tinea unguium: Secondary | ICD-10-CM

## 2022-06-18 NOTE — Progress Notes (Signed)
Patient presents today for the 4th laser treatment. Diagnosed with mycotic nail infection by Dr. Charlsie Merles.   Toenail most affected Bilateral hallux.  All other systems are negative.  Nails were filed thin. Laser therapy was administered to 1-5 toenails bilateral  and patient tolerated the treatment well. All safety precautions were in place.   Single laser pass was done on non-affected nails.   Follow up in 6 weeks for laser # 5.

## 2022-06-19 ENCOUNTER — Other Ambulatory Visit: Payer: Self-pay

## 2022-07-01 NOTE — Progress Notes (Signed)
Patient Care Team: Pcp, No as PCP - General Kim Overlie, MD as Consulting Physician (Obstetrics and Gynecology) Griselda Miner, MD as Consulting Physician (General Surgery) Antony Blackbird, MD as Consulting Physician (Radiation Oncology) Laurey Morale, MD as Consulting Physician (Cardiology) Serena Croissant, MD as Medical Oncologist (Hematology and Oncology)  DIAGNOSIS:  Encounter Diagnosis  Name Primary?   Malignant neoplasm of lower-outer quadrant of right breast of female, estrogen receptor positive (HCC) Yes    SUMMARY OF ONCOLOGIC HISTORY: Oncology History  Malignant neoplasm of lower-outer quadrant of right breast of female, estrogen receptor positive (HCC)  03/22/2016 Surgery   left lumpectomy 03/22/2016 showing atypical lobular hyperplasia prophylactic tamoxifen taken from 04/2016 through 03/2019   06/06/2019 Genetic Testing   Negative genetic testing:  No pathogenic variants detected on the [facility] panel, ordered by Dr. Vincente Poli at [facility]. Two variants of uncertain significance (VUS) were detected - one in the BRIP1 gene called c.2863A>C and a second in the NBN gene called c.643C>T. The report date is 06/06/2019.  The [facility] gene panel offered by [employer] includes sequencing and deletion/duplication testing of the following 35 genes: APC, ATM, AXIN2, BARD1, BMPR1A, BRCA1, BRCA2, BRIP1, CDH1, CDK4, CDKN2A, CHEK2, EPCAM (large rearrangement only), HOXB13 (sequencing only), GALNT12, MLH1, MSH2, MSH3 (excluding repetitive portions of exon 1), MSH6, MUTYH, NBN, NTHL1, PALB2, PMS2, PTEN, RAD51C, RAD51D, RNF43, RPS20, SMAD4, STK11, and TP53. Sequencing was performed for select regions of POLE and POLD1, and large rearrangement analysis was performed for select regions of GREM1.     06/25/2020 Relapse/Recurrence   right breast lower outer quadrant biopsy 06/25/2020 shows a clinical T2 N1, stage IB invasive ductal carcinoma, grade 2 or  3, triple positive, with an MIB-1 of 40%   07/02/2020 Cancer Staging   Staging form: Breast, AJCC 8th Edition - Clinical stage from 07/02/2020: Stage IV (cT2, cN1, cM1, G3, ER+, PR+, HER2+) - Signed by Lowella Dell, MD on 08/04/2020 Stage prefix: Initial diagnosis Histologic grading system: 3 grade system Laterality: Right Staged by: Pathologist and managing physician Stage used in treatment planning: Yes National guidelines used in treatment planning: Yes Type of national guideline used in treatment planning: NCCN   07/16/2020 - 09/30/2020 Chemotherapy   neoadjuvant chemotherapy consisting of docetaxel, carboplatin, trastuzumab and pertuzumab every 21 days x 4 cycles started 07/28/2020, last chemotherapy 09/30/2020       08/01/2020 PET scan   PET scan 08/01/2020 shows no hypermetabolism in the 1.2 cm right liver lesion; bony hypermetabolism consistent with known mets   08/19/2020 - 10/21/2020 Chemotherapy   Patient is on Treatment Plan : BREAST  Trastuzumab + Pertuzumab q21d      10/07/2020 Imaging    total spinal MRI 10/07/2020 shows stable to improved disease, no extraosseous extension, pathologic fracture, epidural or intracanalicular involvement MRI of the breast 10/19/2020 shows a complete imaging response in the breast, with new ring-enhancing lesions in the left breast with MRI guided biopsy 11/10/2020 showing no evidence of malignancy   11/11/2020 - 01/18/2022 Chemotherapy   Patient is on Treatment Plan : BREAST Trastuzumab + Pertuzumab q21d     11/11/2020 - 04/12/2022 Chemotherapy   Patient is on Treatment Plan : BREAST Trastuzumab IV (8/6) or SQ (600) D1 q21d     01/02/2021 Surgery   right lumpectomy with no sentinel lymph node sampling 01/02/2021 showed scattered foci of residual invasive ductal carcinoma, grade 2, the largest measuring 0.8 cm.  Margins were negative   01/02/2021 -  Anti-estrogen oral therapy   anastrozole started 01/12/2021   05/11/2022 -  Chemotherapy    Patient is on Treatment Plan : BREAST MAINTENANCE Trastuzumab IV (6) or SQ (600) D1 q28d x 13 cycles     Malignant neoplasm metastatic to bone (HCC)  08/04/2020 Initial Diagnosis   Bone metastases (HCC)   11/11/2020 - 01/18/2022 Chemotherapy   Patient is on Treatment Plan : BREAST Trastuzumab + Pertuzumab q21d     11/11/2020 - 04/12/2022 Chemotherapy   Patient is on Treatment Plan : BREAST Trastuzumab IV (8/6) or SQ (600) D1 q21d       CHIEF COMPLIANT: Herceptin and exemestane  INTERVAL HISTORY: Kim Archer is a 57 y.o. with above-mentioned history of metastatic breast cancer is currently on Herceptin and Perjeta maintenance. She presents to the clinic for a follow-up. She reports that she has broke out on her face and itching under her arms. She complains of hot flashes and muscle aches in the legs. Denies herceptin upsetting her stomach.    ALLERGIES:  is allergic to ciprofloxacin, ciprofibrate, gabapentin, sulfa antibiotics, and venlafaxine.  MEDICATIONS:  Current Outpatient Medications  Medication Sig Dispense Refill   Biotin 5 MG CAPS Take 1 capsule (5 mg total) by mouth daily.  0   Boron 3 MG CAPS Take by mouth.     Calcium 500 MG tablet Take 600 mg by mouth.     exemestane (AROMASIN) 25 MG tablet Take 1 tablet (25 mg total) by mouth daily after breakfast. 90 tablet 3   ipratropium (ATROVENT) 0.06 % nasal spray Place into both nostrils.     lidocaine-prilocaine (EMLA) cream Apply topically continuous as needed. 30 g 1   loratadine (CLARITIN) 10 MG tablet Take 10 mg by mouth daily.     LUTEIN PO Take by mouth.     Menatetrenone (VITAMIN K2) 100 MCG TABS Take by mouth.     Multiple Vitamin (MULTIVITAMIN WITH MINERALS) TABS tablet Take 1 tablet by mouth daily.     OVER THE COUNTER MEDICATION Take 1 capsule by mouth daily. [place] Mushroom 1200mg      Ubiquinol 50 MG CAPS Take by mouth daily.     VITAMIN D PO 50 mcg.     zinc sulfate 220 (50 Zn) MG capsule Take 220 mg by  mouth daily.     No current facility-administered medications for this visit.    PHYSICAL EXAMINATION: ECOG PERFORMANCE STATUS: 1 - Symptomatic but completely ambulatory  Vitals:   07/05/22 1443  BP: 128/70  Pulse: 63  Temp: 97.9 F (36.6 C)  SpO2: 96%   Filed Weights   07/05/22 1443  Weight: 181 lb 6.4 oz (82.3 kg)      LABORATORY DATA:  I have reviewed the data as listed    Latest Ref Rng & Units 06/07/2022    2:28 PM 02/16/2022    8:48 AM 11/23/2021    8:30 AM  CMP  Glucose 70 - 99 mg/dL 93  960  454   BUN 6 - 20 mg/dL 15  16  17    Creatinine 0.44 - 1.00 mg/dL 0.98  1.19  1.47   Sodium 135 - 145 mmol/L 141  142  142   Potassium 3.5 - 5.1 mmol/L 4.2  3.7  3.5   Chloride 98 - 111 mmol/L 105  106  108   CO2 22 - 32 mmol/L 29  30  28    Calcium 8.9 - 10.3 mg/dL 82.9  9.9  9.5   Total  Protein 6.5 - 8.1 g/dL 7.4  7.0  7.2   Total Bilirubin 0.3 - 1.2 mg/dL 0.4  0.3  0.3   Alkaline Phos 38 - 126 U/L 105  77  88   AST 15 - 41 U/L 19  18  17    ALT 0 - 44 U/L 20  24  22      Lab Results  Component Value Date   WBC 6.9 06/07/2022   HGB 13.2 06/07/2022   HCT 40.1 06/07/2022   MCV 95.5 06/07/2022   PLT 230 06/07/2022   NEUTROABS 3.6 06/07/2022    ASSESSMENT & PLAN:  Malignant neoplasm of lower-outer quadrant of right breast of female, estrogen receptor positive (HCC) Metastatic Breast cancer ER/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  Joint pains and stiffness:on letrozole 2. hot flashes 3. Diarrhea from Perjeta: Resolved since we stopped Perjeta   Continue with Herceptin maintenance monthly   Zometa to every 6 months.   Liver lesion: PET/CT 03/14/2022: No evidence of hypermetabolic metastatic disease in the chest abdomen or pelvis.  Cystic lesions within the liver without any hypermetabolism,  scattered sclerotic lesions treated bone metastases. Based on these findings we do not see that the liver lesions are metastatic sites and therefore we will watch and monitor them.   Itching of the skin of forearms and joint pain: Much improved since she stopped her letrozole.    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  Brain MRI 06/08/2022: Benign   Follow-up in 3 weeks for Herceptin every 6 weeks of follow-up with me.    No orders of the defined types were placed in this encounter.  The patient has a good understanding of the overall plan. she agrees with it. she will call with any problems that may develop before the next visit here. Total time spent: 30 mins including face to face time and time spent for planning, charting and co-ordination of care   Tamsen Meek, MD 07/05/22    I Janan Ridge am acting as a Neurosurgeon for [employer]  I have reviewed the above documentation for accuracy and completeness, and I agree with the above.

## 2022-07-05 ENCOUNTER — Inpatient Hospital Stay: Payer: Commercial Managed Care - PPO | Attending: Hematology and Oncology

## 2022-07-05 ENCOUNTER — Inpatient Hospital Stay (HOSPITAL_BASED_OUTPATIENT_CLINIC_OR_DEPARTMENT_OTHER): Payer: Commercial Managed Care - PPO | Admitting: Hematology and Oncology

## 2022-07-05 ENCOUNTER — Other Ambulatory Visit: Payer: Self-pay

## 2022-07-05 VITALS — BP 128/70 | HR 63 | Temp 97.9°F | Wt 181.4 lb

## 2022-07-05 VITALS — Resp 18

## 2022-07-05 DIAGNOSIS — Z79811 Long term (current) use of aromatase inhibitors: Secondary | ICD-10-CM | POA: Insufficient documentation

## 2022-07-05 DIAGNOSIS — C7951 Secondary malignant neoplasm of bone: Secondary | ICD-10-CM | POA: Insufficient documentation

## 2022-07-05 DIAGNOSIS — Z170 Estrogen receptor positive status [ER+]: Secondary | ICD-10-CM

## 2022-07-05 DIAGNOSIS — Z5112 Encounter for antineoplastic immunotherapy: Secondary | ICD-10-CM | POA: Insufficient documentation

## 2022-07-05 DIAGNOSIS — Z79620 Long term (current) use of immunosuppressive biologic: Secondary | ICD-10-CM | POA: Diagnosis not present

## 2022-07-05 DIAGNOSIS — C50511 Malignant neoplasm of lower-outer quadrant of right female breast: Secondary | ICD-10-CM

## 2022-07-05 MED ORDER — SODIUM CHLORIDE 0.9 % IV SOLN: Freq: Once | INTRAVENOUS | Status: AC

## 2022-07-05 MED ORDER — TRASTUZUMAB-ANNS CHEMO 150 MG IV SOLR
6.0000 mg/kg | Freq: Once | INTRAVENOUS | Status: AC
  Administered 2022-07-05: 504 mg via INTRAVENOUS
  Filled 2022-07-05: qty 24

## 2022-07-05 MED ORDER — ACETAMINOPHEN 325 MG PO TABS: 650.0000 mg | ORAL_TABLET | Freq: Once | ORAL | Status: DC

## 2022-07-05 MED ORDER — SODIUM CHLORIDE 0.9% FLUSH
10.0000 mL | INTRAVENOUS | Status: DC | PRN
  Administered 2022-07-05: 10 mL

## 2022-07-05 MED ORDER — HEPARIN SOD (PORK) LOCK FLUSH 100 UNIT/ML IV SOLN
500.0000 [IU] | Freq: Once | INTRAVENOUS | Status: AC | PRN
  Administered 2022-07-05: 500 [IU]

## 2022-07-05 MED ORDER — DIPHENHYDRAMINE HCL 25 MG PO CAPS: 50.0000 mg | ORAL_CAPSULE | Freq: Once | ORAL | Status: DC

## 2022-07-05 NOTE — Assessment & Plan Note (Addendum)
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  Joint pains and stiffness:on letrozole 2. hot flashes 3. Diarrhea from Perjeta: Resolved since we stopped Perjeta   Continue with Herceptin maintenance monthly   Zometa to every 6 months.   Liver lesion: PET/CT 03/14/2022: No evidence of hypermetabolic metastatic disease in the chest abdomen or pelvis.  Cystic lesions within the liver without any hypermetabolism, scattered sclerotic lesions treated bone metastases. Based on these findings we do not see that the liver lesions are metastatic sites and therefore we will watch and monitor them.   Itching of the skin of forearms and joint pain: Much improved since she stopped her letrozole.    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  Brain MRI 06/08/2022: Benign   Follow-up in 3 weeks for Herceptin every 6 weeks of follow-up with me.

## 2022-07-05 NOTE — Patient Instructions (Signed)
[facility]  Discharge Instructions: Thank you for choosing [facility] to provide your oncology and hematology care.   If you have a lab appointment with the Cancer Center, please go directly to the Cancer Center and check in at the registration area.   Wear comfortable clothing and clothing appropriate for easy access to any Portacath or PICC line.   We strive to give you quality time with your provider. You may need to reschedule your appointment if you arrive late (15 or more minutes).  Arriving late affects you and other patients whose appointments are after yours.  Also, if you miss three or more appointments without notifying the office, you may be dismissed from the clinic at the provider's discretion.      For prescription refill requests, have your pharmacy contact our office and allow 72 hours for refills to be completed.    Today you received the following chemotherapy and/or immunotherapy agents: Kanjinti.      To help prevent nausea and vomiting after your treatment, we encourage you to take your nausea medication as directed.  BELOW ARE SYMPTOMS THAT SHOULD BE REPORTED IMMEDIATELY: *FEVER GREATER THAN 100.4 F (38 C) OR HIGHER *CHILLS OR SWEATING *NAUSEA AND VOMITING THAT IS NOT CONTROLLED WITH YOUR NAUSEA MEDICATION *UNUSUAL SHORTNESS OF BREATH *UNUSUAL BRUISING OR BLEEDING *URINARY PROBLEMS (pain or burning when urinating, or frequent urination) *BOWEL PROBLEMS (unusual diarrhea, constipation, pain near the anus) TENDERNESS IN MOUTH AND THROAT WITH OR WITHOUT PRESENCE OF ULCERS (sore throat, sores in mouth, or a toothache) UNUSUAL RASH, SWELLING OR PAIN  UNUSUAL VAGINAL DISCHARGE OR ITCHING   Items with * indicate a potential emergency and should be followed up as soon as possible or go to the Emergency Department if any problems should occur.  Please show the CHEMOTHERAPY ALERT CARD or IMMUNOTHERAPY ALERT CARD at  check-in to the Emergency Department and triage nurse.  Should you have questions after your visit or need to cancel or reschedule your appointment, please contact [facility]  Dept: [phone]  and follow the prompts.  Office hours are 8:00 a.m. to 4:30 p.m. Monday - Friday. Please note that voicemails left after 4:00 p.m. may not be returned until the following business day.  We are closed weekends and major holidays. You have access to a nurse at all times for urgent questions. Please call the main number to the clinic Dept: [phone] and follow the prompts.   For any non-urgent questions, you may also contact your provider using MyChart. We now offer e-Visits for anyone 18 and older to request care online for non-urgent symptoms. For details visit [email or web address].   Also download the MyChart app! Go to the app store, search "MyChart", open the app, select [place], and log in with your MyChart username and password.   

## 2022-07-06 ENCOUNTER — Other Ambulatory Visit: Payer: Self-pay

## 2022-07-07 ENCOUNTER — Ambulatory Visit (HOSPITAL_COMMUNITY)
Admission: RE | Admit: 2022-07-07 | Discharge: 2022-07-07 | Disposition: A | Discharge status: Home / Self Care | Payer: Commercial Managed Care - PPO | Source: Ambulatory Visit | Attending: Hematology and Oncology | Admitting: Hematology and Oncology

## 2022-07-07 DIAGNOSIS — Z79899 Other long term (current) drug therapy: Secondary | ICD-10-CM | POA: Diagnosis not present

## 2022-07-07 DIAGNOSIS — Z5181 Encounter for therapeutic drug level monitoring: Secondary | ICD-10-CM | POA: Diagnosis present

## 2022-07-07 DIAGNOSIS — C50919 Malignant neoplasm of unspecified site of unspecified female breast: Secondary | ICD-10-CM | POA: Diagnosis not present

## 2022-07-07 LAB — ECHOCARDIOGRAM COMPLETE
AR max vel: 2.53 cm2
AV Area VTI: 2.55 cm2
AV Area mean vel: 2.49 cm2
AV Mean grad: 3 mmHg
AV Peak grad: 6 mmHg
Ao pk vel: 1.22 m/s
Area-P 1/2: 4.29 cm2
Calc EF: 64.3 %
S' Lateral: 3 cm
Single Plane A2C EF: 65.5 %
Single Plane A4C EF: 63.9 %

## 2022-07-28 ENCOUNTER — Other Ambulatory Visit: Payer: Self-pay

## 2022-08-03 ENCOUNTER — Inpatient Hospital Stay: Payer: Commercial Managed Care - PPO | Attending: Hematology and Oncology

## 2022-08-03 ENCOUNTER — Other Ambulatory Visit: Payer: Self-pay

## 2022-08-03 VITALS — BP 125/78 | HR 59 | Temp 97.9°F | Resp 16 | Wt 184.0 lb

## 2022-08-03 DIAGNOSIS — Z5112 Encounter for antineoplastic immunotherapy: Secondary | ICD-10-CM | POA: Diagnosis present

## 2022-08-03 DIAGNOSIS — C50511 Malignant neoplasm of lower-outer quadrant of right female breast: Secondary | ICD-10-CM | POA: Insufficient documentation

## 2022-08-03 DIAGNOSIS — C7951 Secondary malignant neoplasm of bone: Secondary | ICD-10-CM | POA: Insufficient documentation

## 2022-08-03 DIAGNOSIS — Z170 Estrogen receptor positive status [ER+]: Secondary | ICD-10-CM | POA: Diagnosis not present

## 2022-08-03 MED ORDER — DIPHENHYDRAMINE HCL 25 MG PO CAPS: 50.0000 mg | ORAL_CAPSULE | Freq: Once | ORAL | Status: DC

## 2022-08-03 MED ORDER — HEPARIN SOD (PORK) LOCK FLUSH 100 UNIT/ML IV SOLN
500.0000 [IU] | Freq: Once | INTRAVENOUS | Status: AC | PRN
  Administered 2022-08-03: 500 [IU]

## 2022-08-03 MED ORDER — SODIUM CHLORIDE 0.9% FLUSH
10.0000 mL | INTRAVENOUS | Status: DC | PRN
  Administered 2022-08-03: 10 mL

## 2022-08-03 MED ORDER — SODIUM CHLORIDE 0.9 % IV SOLN: Freq: Once | INTRAVENOUS | Status: AC

## 2022-08-03 MED ORDER — TRASTUZUMAB-ANNS CHEMO 150 MG IV SOLR
6.0000 mg/kg | Freq: Once | INTRAVENOUS | Status: AC
  Administered 2022-08-03: 504 mg via INTRAVENOUS
  Filled 2022-08-03: qty 24

## 2022-08-03 MED ORDER — ACETAMINOPHEN 325 MG PO TABS: 650.0000 mg | ORAL_TABLET | Freq: Once | ORAL | Status: DC

## 2022-08-03 NOTE — Patient Instructions (Signed)
[facility]  Discharge Instructions: Thank you for choosing [facility] to provide your oncology and hematology care.   If you have a lab appointment with the Cancer Center, please go directly to the Cancer Center and check in at the registration area.   Wear comfortable clothing and clothing appropriate for easy access to any Portacath or PICC line.   We strive to give you quality time with your provider. You may need to reschedule your appointment if you arrive late (15 or more minutes).  Arriving late affects you and other patients whose appointments are after yours.  Also, if you miss three or more appointments without notifying the office, you may be dismissed from the clinic at the provider's discretion.      For prescription refill requests, have your pharmacy contact our office and allow 72 hours for refills to be completed.    Today you received the following chemotherapy and/or immunotherapy agent: Trastuzumab ([place])   To help prevent nausea and vomiting after your treatment, we encourage you to take your nausea medication as directed.  BELOW ARE SYMPTOMS THAT SHOULD BE REPORTED IMMEDIATELY: *FEVER GREATER THAN 100.4 F (38 C) OR HIGHER *CHILLS OR SWEATING *NAUSEA AND VOMITING THAT IS NOT CONTROLLED WITH YOUR NAUSEA MEDICATION *UNUSUAL SHORTNESS OF BREATH *UNUSUAL BRUISING OR BLEEDING *URINARY PROBLEMS (pain or burning when urinating, or frequent urination) *BOWEL PROBLEMS (unusual diarrhea, constipation, pain near the anus) TENDERNESS IN MOUTH AND THROAT WITH OR WITHOUT PRESENCE OF ULCERS (sore throat, sores in mouth, or a toothache) UNUSUAL RASH, SWELLING OR PAIN  UNUSUAL VAGINAL DISCHARGE OR ITCHING   Items with * indicate a potential emergency and should be followed up as soon as possible or go to the Emergency Department if any problems should occur.  Please show the CHEMOTHERAPY ALERT CARD or IMMUNOTHERAPY ALERT CARD  at check-in to the Emergency Department and triage nurse.  Should you have questions after your visit or need to cancel or reschedule your appointment, please contact [place] [facility]  Dept: [phone]  and follow the prompts.  Office hours are 8:00 a.m. to 4:30 p.m. Monday - Friday. Please note that voicemails left after 4:00 p.m. may not be returned until the following business day.  We are closed weekends and major holidays. You have access to a nurse at all times for urgent questions. Please call the main number to the clinic Dept: [phone] and follow the prompts.   For any non-urgent questions, you may also contact your provider using MyChart. We now offer e-Visits for anyone 58 and older to request care online for non-urgent symptoms. For details visit [email or web address].   Also download the MyChart app! Go to the app store, search "MyChart", open the app, select [place], and log in with your MyChart username and password.

## 2022-08-06 ENCOUNTER — Ambulatory Visit (INDEPENDENT_AMBULATORY_CARE_PROVIDER_SITE_OTHER): Payer: Commercial Managed Care - PPO | Admitting: *Deleted

## 2022-08-06 DIAGNOSIS — B351 Tinea unguium: Secondary | ICD-10-CM

## 2022-08-06 NOTE — Progress Notes (Signed)
Patient presents today for the 5th laser treatment. Diagnosed with mycotic nail infection by Dr. Charlsie Merles.   Toenail most affected bilateral hallux.  All other systems are negative.  Nails were filed thin. Laser therapy was administered to 1-5 toenails bilateral  and patient tolerated the treatment well. All safety precautions were in place.   Single laser pass was done on non-affected nails.   Patient's nails look clear and she would like to stop laser at this time. I recommended she follow up as needed.

## 2022-08-31 ENCOUNTER — Inpatient Hospital Stay: Payer: Commercial Managed Care - PPO | Attending: Hematology and Oncology

## 2022-08-31 ENCOUNTER — Other Ambulatory Visit: Payer: Self-pay

## 2022-08-31 VITALS — BP 124/68 | HR 60 | Temp 98.4°F | Resp 18 | Ht 67.0 in | Wt 184.2 lb

## 2022-08-31 DIAGNOSIS — Z5112 Encounter for antineoplastic immunotherapy: Secondary | ICD-10-CM | POA: Insufficient documentation

## 2022-08-31 DIAGNOSIS — Z170 Estrogen receptor positive status [ER+]: Secondary | ICD-10-CM | POA: Diagnosis not present

## 2022-08-31 DIAGNOSIS — C50511 Malignant neoplasm of lower-outer quadrant of right female breast: Secondary | ICD-10-CM | POA: Diagnosis not present

## 2022-08-31 DIAGNOSIS — Z79620 Long term (current) use of immunosuppressive biologic: Secondary | ICD-10-CM | POA: Insufficient documentation

## 2022-08-31 DIAGNOSIS — C7951 Secondary malignant neoplasm of bone: Secondary | ICD-10-CM | POA: Insufficient documentation

## 2022-08-31 MED ORDER — ACETAMINOPHEN 325 MG PO TABS: 650.0000 mg | ORAL_TABLET | Freq: Once | ORAL | Status: DC

## 2022-08-31 MED ORDER — TRASTUZUMAB-ANNS CHEMO 150 MG IV SOLR
6.0000 mg/kg | Freq: Once | INTRAVENOUS | Status: AC
  Administered 2022-08-31: 504 mg via INTRAVENOUS
  Filled 2022-08-31: qty 24

## 2022-08-31 MED ORDER — SODIUM CHLORIDE 0.9% FLUSH
10.0000 mL | INTRAVENOUS | Status: DC | PRN
  Administered 2022-08-31: 10 mL

## 2022-08-31 MED ORDER — DIPHENHYDRAMINE HCL 25 MG PO CAPS: 50.0000 mg | ORAL_CAPSULE | Freq: Once | ORAL | Status: DC

## 2022-08-31 MED ORDER — HEPARIN SOD (PORK) LOCK FLUSH 100 UNIT/ML IV SOLN
500.0000 [IU] | Freq: Once | INTRAVENOUS | Status: AC | PRN
  Administered 2022-08-31: 500 [IU]

## 2022-08-31 MED ORDER — SODIUM CHLORIDE 0.9 % IV SOLN: Freq: Once | INTRAVENOUS | Status: AC

## 2022-08-31 NOTE — Patient Instructions (Signed)
[facility]  Discharge Instructions: Thank you for choosing [facility] to provide your oncology and hematology care.   If you have a lab appointment with the Cancer Center, please go directly to the Cancer Center and check in at the registration area.   Wear comfortable clothing and clothing appropriate for easy access to any Portacath or PICC line.   We strive to give you quality time with your provider. You may need to reschedule your appointment if you arrive late (15 or more minutes).  Arriving late affects you and other patients whose appointments are after yours.  Also, if you miss three or more appointments without notifying the office, you may be dismissed from the clinic at the provider's discretion.      For prescription refill requests, have your pharmacy contact our office and allow 72 hours for refills to be completed.    Today you received the following chemotherapy and/or immunotherapy agent: Trastuzumab (Kanjinti)   To help prevent nausea and vomiting after your treatment, we encourage you to take your nausea medication as directed.  BELOW ARE SYMPTOMS THAT SHOULD BE REPORTED IMMEDIATELY: *FEVER GREATER THAN 100.4 F (38 C) OR HIGHER *CHILLS OR SWEATING *NAUSEA AND VOMITING THAT IS NOT CONTROLLED WITH YOUR NAUSEA MEDICATION *UNUSUAL SHORTNESS OF BREATH *UNUSUAL BRUISING OR BLEEDING *URINARY PROBLEMS (pain or burning when urinating, or frequent urination) *BOWEL PROBLEMS (unusual diarrhea, constipation, pain near the anus) TENDERNESS IN MOUTH AND THROAT WITH OR WITHOUT PRESENCE OF ULCERS (sore throat, sores in mouth, or a toothache) UNUSUAL RASH, SWELLING OR PAIN  UNUSUAL VAGINAL DISCHARGE OR ITCHING   Items with * indicate a potential emergency and should be followed up as soon as possible or go to the Emergency Department if any problems should occur.  Please show the CHEMOTHERAPY ALERT CARD or IMMUNOTHERAPY ALERT CARD  at check-in to the Emergency Department and triage nurse.  Should you have questions after your visit or need to cancel or reschedule your appointment, please contact [facility]  Dept: [phone]  and follow the prompts.  Office hours are 8:00 a.m. to 4:30 p.m. Monday - Friday. Please note that voicemails left after 4:00 p.m. may not be returned until the following business day.  We are closed weekends and major holidays. You have access to a nurse at all times for urgent questions. Please call the main number to the clinic Dept: [phone] and follow the prompts.   For any non-urgent questions, you may also contact your provider using MyChart. We now offer e-Visits for anyone 59 and older to request care online for non-urgent symptoms. For details visit [email or web address].   Also download the MyChart app! Go to the app store, search "MyChart", open the app, select [place], and log in with your MyChart username and password. Trastuzumab Injection What is this medication? TRASTUZUMAB (tras TOO zoo mab) treats breast cancer and stomach cancer. It works by blocking a protein that causes cancer cells to grow and multiply. This helps to slow or stop the spread of cancer cells. This medicine may be used for other purposes; ask your health care provider or pharmacist if you have questions. COMMON BRAND NAME(S): Herceptin, Marlowe Alt, Ontruzant, Trazimera What should I tell my care team before I take this medication? They need to know if you have any of these conditions: Heart failure Lung disease An unusual or allergic reaction to trastuzumab, other medications, foods, dyes, or  preservatives Pregnant or trying to get pregnant Breast-feeding How should I use this medication? This medication is injected into a vein. It is given by your care team in a hospital or clinic setting. Talk to your care team about the use of this medication in  children. It is not approved for use in children. Overdosage: If you think you have taken too much of this medicine contact a poison control center or emergency room at once. NOTE: This medicine is only for you. Do not share this medicine with others. What if I miss a dose? Keep appointments for follow-up doses. It is important not to miss your dose. Call your care team if you are unable to keep an appointment. What may interact with this medication? Certain types of chemotherapy, such as daunorubicin, doxorubicin, epirubicin, idarubicin This list may not describe all possible interactions. Give your health care provider a list of all the medicines, herbs, non-prescription drugs, or dietary supplements you use. Also tell them if you smoke, drink alcohol, or use illegal drugs. Some items may interact with your medicine. What should I watch for while using this medication? Your condition will be monitored carefully while you are receiving this medication. This medication may make you feel generally unwell. This is not uncommon, as chemotherapy affects healthy cells as well as cancer cells. Report any side effects. Continue your course of treatment even though you feel ill unless your care team tells you to stop. This medication may increase your risk of getting an infection. Call your care team for advice if you get a fever, chills, sore throat, or other symptoms of a cold or flu. Do not treat yourself. Try to avoid being around people who are sick. Avoid taking medications that contain aspirin, acetaminophen, ibuprofen, naproxen, or ketoprofen unless instructed by your care team. These medications can hide a fever. Talk to your care team if you may be pregnant. Serious birth defects can occur if you take this medication during pregnancy and for 7 months after the last dose. You will need a negative pregnancy test before starting this medication. Contraception is recommended while taking this medication  and for 7 months after the last dose. Your care team can help you find the option that works for you. Do not breastfeed while taking this medication and for 7 months after stopping treatment. What side effects may I notice from receiving this medication? Side effects that you should report to your care team as soon as possible: Allergic reactions or angioedema--skin rash, itching or hives, swelling of the face, eyes, lips, tongue, arms, or legs, trouble swallowing or breathing Dry cough, shortness of breath or trouble breathing Heart failure--shortness of breath, swelling of the ankles, feet, or hands, sudden weight gain, unusual weakness or fatigue Infection--fever, chills, cough, or sore throat Infusion reactions--chest pain, shortness of breath or trouble breathing, feeling faint or lightheaded Side effects that usually do not require medical attention (report to your care team if they continue or are bothersome): Diarrhea Dizziness Headache Nausea Trouble sleeping Vomiting This list may not describe all possible side effects. Call your doctor for medical advice about side effects. You may report side effects to FDA at 1-800-FDA-1088. Where should I keep my medication? This medication is given in a hospital or clinic. It will not be stored at home. NOTE: This sheet is a summary. It may not cover all possible information. If you have questions about this medicine, talk to your doctor, pharmacist, or health care provider.  2024  Elsevier/Gold Standard (2021-06-16 00:00:00)

## 2022-09-27 ENCOUNTER — Telehealth: Payer: Self-pay | Admitting: Hematology and Oncology

## 2022-09-27 NOTE — Telephone Encounter (Signed)
Scheduled appointments per WQ. Left voicemail with appointment details.

## 2022-09-28 ENCOUNTER — Other Ambulatory Visit: Payer: Self-pay

## 2022-09-28 ENCOUNTER — Encounter: Payer: Self-pay | Admitting: Hematology and Oncology

## 2022-09-28 ENCOUNTER — Inpatient Hospital Stay: Payer: Commercial Managed Care - PPO

## 2022-09-28 ENCOUNTER — Inpatient Hospital Stay: Payer: Commercial Managed Care - PPO | Attending: Hematology and Oncology | Admitting: Hematology and Oncology

## 2022-09-28 VITALS — BP 130/67 | HR 50 | Temp 97.7°F | Resp 17 | Wt 183.5 lb

## 2022-09-28 DIAGNOSIS — Z79620 Long term (current) use of immunosuppressive biologic: Secondary | ICD-10-CM | POA: Insufficient documentation

## 2022-09-28 DIAGNOSIS — C7951 Secondary malignant neoplasm of bone: Secondary | ICD-10-CM | POA: Diagnosis not present

## 2022-09-28 DIAGNOSIS — R350 Frequency of micturition: Secondary | ICD-10-CM

## 2022-09-28 DIAGNOSIS — Z5112 Encounter for antineoplastic immunotherapy: Secondary | ICD-10-CM | POA: Diagnosis not present

## 2022-09-28 DIAGNOSIS — N200 Calculus of kidney: Secondary | ICD-10-CM

## 2022-09-28 DIAGNOSIS — Z170 Estrogen receptor positive status [ER+]: Secondary | ICD-10-CM

## 2022-09-28 DIAGNOSIS — C50511 Malignant neoplasm of lower-outer quadrant of right female breast: Secondary | ICD-10-CM | POA: Diagnosis not present

## 2022-09-28 MED ORDER — SODIUM CHLORIDE 0.9 % IV SOLN: Freq: Once | INTRAVENOUS | Status: AC

## 2022-09-28 MED ORDER — TRASTUZUMAB-ANNS CHEMO 150 MG IV SOLR
6.0000 mg/kg | Freq: Once | INTRAVENOUS | Status: AC
  Administered 2022-09-28: 504 mg via INTRAVENOUS
  Filled 2022-09-28: qty 24

## 2022-09-28 MED ORDER — DIPHENHYDRAMINE HCL 25 MG PO CAPS: 50.0000 mg | ORAL_CAPSULE | Freq: Once | ORAL | Status: DC

## 2022-09-28 MED ORDER — HEPARIN SOD (PORK) LOCK FLUSH 100 UNIT/ML IV SOLN
500.0000 [IU] | Freq: Once | INTRAVENOUS | Status: AC | PRN
  Administered 2022-09-28: 500 [IU]

## 2022-09-28 MED ORDER — SODIUM CHLORIDE 0.9% FLUSH
10.0000 mL | INTRAVENOUS | Status: DC | PRN
  Administered 2022-09-28: 10 mL

## 2022-09-28 MED ORDER — ACETAMINOPHEN 325 MG PO TABS: 650.0000 mg | ORAL_TABLET | Freq: Once | ORAL | Status: DC

## 2022-09-28 NOTE — Progress Notes (Signed)
Patient Care Team: Pcp, No as PCP - General Marcelle Overlie, MD as Consulting Physician (Obstetrics and Gynecology) Griselda Miner, MD as Consulting Physician (General Surgery) Antony Blackbird, MD as Consulting Physician (Radiation Oncology) Laurey Morale, MD as Consulting Physician (Cardiology) Serena Croissant, MD as Medical Oncologist (Hematology and Oncology)  DIAGNOSIS:  Encounter Diagnoses  Name Primary?   Malignant neoplasm of lower-outer quadrant of right breast of female, estrogen receptor positive (HCC) Yes   Urinary frequency    Kidney stone     SUMMARY OF ONCOLOGIC HISTORY: Oncology History  Malignant neoplasm of lower-outer quadrant of right breast of female, estrogen receptor positive (HCC)  03/22/2016 Surgery   left lumpectomy 03/22/2016 showing atypical lobular hyperplasia prophylactic tamoxifen taken from 04/2016 through 03/2019   06/06/2019 Genetic Testing   Negative genetic testing:  No pathogenic variants detected on the [facility] panel, ordered by Dr. Vincente Poli at [facility]. Two variants of uncertain significance (VUS) were detected - one in the BRIP1 gene called c.2863A>C and a second in the NBN gene called c.643C>T. The report date is 06/06/2019.  The [facility] gene panel offered by [employer] includes sequencing and deletion/duplication testing of the following 35 genes: APC, ATM, AXIN2, BARD1, BMPR1A, BRCA1, BRCA2, BRIP1, CDH1, CDK4, CDKN2A, CHEK2, EPCAM (large rearrangement only), HOXB13 (sequencing only), GALNT12, MLH1, MSH2, MSH3 (excluding repetitive portions of exon 1), MSH6, MUTYH, NBN, NTHL1, PALB2, PMS2, PTEN, RAD51C, RAD51D, RNF43, RPS20, SMAD4, STK11, and TP53. Sequencing was performed for select regions of POLE and POLD1, and large rearrangement analysis was performed for select regions of GREM1.     06/25/2020 Relapse/Recurrence   right breast lower outer quadrant biopsy 06/25/2020 shows a clinical T2 N1, stage IB  invasive ductal carcinoma, grade 2 or 3, triple positive, with an MIB-1 of 40%   07/02/2020 Cancer Staging   Staging form: Breast, AJCC 8th Edition - Clinical stage from 07/02/2020: Stage IV (cT2, cN1, cM1, G3, ER+, PR+, HER2+) - Signed by Lowella Dell, MD on 08/04/2020 Stage prefix: Initial diagnosis Histologic grading system: 3 grade system Laterality: Right Staged by: Pathologist and managing physician Stage used in treatment planning: Yes National guidelines used in treatment planning: Yes Type of national guideline used in treatment planning: NCCN   07/16/2020 - 09/30/2020 Chemotherapy   neoadjuvant chemotherapy consisting of docetaxel, carboplatin, trastuzumab and pertuzumab every 21 days x 4 cycles started 07/28/2020, last chemotherapy 09/30/2020       08/01/2020 PET scan   PET scan 08/01/2020 shows no hypermetabolism in the 1.2 cm right liver lesion; bony hypermetabolism consistent with known mets   08/19/2020 - 10/21/2020 Chemotherapy   Patient is on Treatment Plan : BREAST  Trastuzumab + Pertuzumab q21d      10/07/2020 Imaging    total spinal MRI 10/07/2020 shows stable to improved disease, no extraosseous extension, pathologic fracture, epidural or intracanalicular involvement MRI of the breast 10/19/2020 shows a complete imaging response in the breast, with new ring-enhancing lesions in the left breast with MRI guided biopsy 11/10/2020 showing no evidence of malignancy   11/11/2020 - 01/18/2022 Chemotherapy   Patient is on Treatment Plan : BREAST Trastuzumab + Pertuzumab q21d     11/11/2020 - 04/12/2022 Chemotherapy   Patient is on Treatment Plan : BREAST Trastuzumab IV (8/6) or SQ (600) D1 q21d     01/02/2021 Surgery   right lumpectomy with no sentinel lymph node sampling 01/02/2021 showed scattered foci of residual invasive ductal carcinoma, grade 2, the largest measuring  0.8 cm.  Margins were negative   01/02/2021 -  Anti-estrogen oral therapy   anastrozole started  01/12/2021   05/11/2022 -  Chemotherapy   Patient is on Treatment Plan : BREAST MAINTENANCE Trastuzumab IV (6) or SQ (600) D1 q28d x 13 cycles     Malignant neoplasm metastatic to bone (HCC)  08/04/2020 Initial Diagnosis   Bone metastases (HCC)   11/11/2020 - 01/18/2022 Chemotherapy   Patient is on Treatment Plan : BREAST Trastuzumab + Pertuzumab q21d     11/11/2020 - 04/12/2022 Chemotherapy   Patient is on Treatment Plan : BREAST Trastuzumab IV (8/6) or SQ (600) D1 q21d       CHIEF COMPLIANT: Herceptin and exemestane   INTERVAL HISTORY: Kim Archer is a 57 y.o. with above-mentioned history of metastatic breast cancer is currently on Herceptin and Perjeta maintenance. She presents to the clinic for a follow-up. Patient reports the rash has subsided. She states that the joint stiffness is starting to come back. She says at night it is the worst. She complains about gaining weight.   ALLERGIES:  is allergic to ciprofloxacin, ciprofibrate, gabapentin, sulfa antibiotics, and venlafaxine.  MEDICATIONS:  Current Outpatient Medications  Medication Sig Dispense Refill   Biotin 5 MG CAPS Take 1 capsule (5 mg total) by mouth daily.  0   Boron 3 MG CAPS Take by mouth.     Calcium 500 MG tablet Take 600 mg by mouth.     exemestane (AROMASIN) 25 MG tablet Take 1 tablet (25 mg total) by mouth daily after breakfast. 90 tablet 3   ipratropium (ATROVENT) 0.06 % nasal spray Place into both nostrils.     lidocaine-prilocaine (EMLA) cream Apply topically continuous as needed. 30 g 1   loratadine (CLARITIN) 10 MG tablet Take 10 mg by mouth daily.     LUTEIN PO Take by mouth.     Menatetrenone (VITAMIN K2) 100 MCG TABS Take by mouth.     Multiple Vitamin (MULTIVITAMIN WITH MINERALS) TABS tablet Take 1 tablet by mouth daily.     OVER THE COUNTER MEDICATION Take 1 capsule by mouth daily. [place] Mushroom 1200mg      Ubiquinol 50 MG CAPS Take by mouth daily.     VITAMIN D PO 50 mcg.     zinc sulfate  220 (50 Zn) MG capsule Take 220 mg by mouth daily.     Calcium 200 MG TABS      Cholecalciferol (VITAMIN D) 10 MCG/ML LIQD      Lutein 5 % BEAD      Menaquinone-7 (VITAMIN K2) 0.2 % POWD 100 mcg     Pediatric Multivitamins-Fl (MULTIVITAMIN + FLUORIDE) 0.25 MG CHEW      Zinc 10 MG LOZG      No current facility-administered medications for this visit.    PHYSICAL EXAMINATION: ECOG PERFORMANCE STATUS: 1 - Symptomatic but completely ambulatory  Vitals:   09/28/22 1428  BP: 130/67  Pulse: (!) 50  Resp: 17  Temp: 97.7 F (36.5 C)  SpO2: 99%   Filed Weights   09/28/22 1428  Weight: 183 lb 8 oz (83.2 kg)     LABORATORY DATA:  I have reviewed the data as listed    Latest Ref Rng & Units 06/07/2022    2:28 PM 02/16/2022    8:48 AM 11/23/2021    8:30 AM  CMP  Glucose 70 - 99 mg/dL 93  161  096   BUN 6 - 20 mg/dL 15  16  17   Creatinine 0.44 - 1.00 mg/dL 7.82  9.56  2.13   Sodium 135 - 145 mmol/L 141  142  142   Potassium 3.5 - 5.1 mmol/L 4.2  3.7  3.5   Chloride 98 - 111 mmol/L 105  106  108   CO2 22 - 32 mmol/L 29  30  28    Calcium 8.9 - 10.3 mg/dL 08.6  9.9  9.5   Total Protein 6.5 - 8.1 g/dL 7.4  7.0  7.2   Total Bilirubin 0.3 - 1.2 mg/dL 0.4  0.3  0.3   Alkaline Phos 38 - 126 U/L 105  77  88   AST 15 - 41 U/L 19  18  17    ALT 0 - 44 U/L 20  24  22      Lab Results  Component Value Date   WBC 6.9 06/07/2022   HGB 13.2 06/07/2022   HCT 40.1 06/07/2022   MCV 95.5 06/07/2022   PLT 230 06/07/2022   NEUTROABS 3.6 06/07/2022    ASSESSMENT & PLAN:  Malignant neoplasm of lower-outer quadrant of right breast of female, estrogen receptor positive (HCC) 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  Joint pains and stiffness:on letrozole 2. hot flashes 3. Diarrhea from Perjeta:  Resolved since we stopped Perjeta   Continue with Herceptin maintenance monthly   Zometa to every 6 months.   Liver lesion: PET/CT 03/14/2022: No evidence of hypermetabolic metastatic disease in the chest abdomen or pelvis.  Cystic lesions within the liver without any hypermetabolism, scattered sclerotic lesions treated bone metastases. Based on these findings we do not see that the liver lesions are metastatic sites and therefore we will watch and monitor them.   Itching of the skin of forearms and joint pain: Much improved since she stopped her letrozole.   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  Brain MRI 06/08/2022: Benign  Urinary frequency urgency and leakage and a left kidney stone: I will refer the patient to urology.  Weight gain: Patient is contemplating on GLP-1 antagonist.  I discussed with her that it is perfectly fine from our standpoint to proceed with that treatment.   Follow-up in 3 weeks for Herceptin every 6 weeks of follow-up with me.   Orders Placed This Encounter  Procedures   Ambulatory referral to Urology    Referral Priority:   Routine    Referral Type:   Consultation    Referral Reason:   Specialty Services Required    Referred to Provider:   Heloise Purpura, MD    Requested Specialty:   Urology    Number of Visits Requested:   1   The patient has a good understanding of the overall plan. she agrees with it. she will call with any problems that may develop before the next visit here. Total time spent: 30 mins including face to face time and time spent for planning, charting and co-ordination of care   Tamsen Meek, MD 09/28/22    sacroiliac

## 2022-09-28 NOTE — Patient Instructions (Signed)
[facility]  Discharge Instructions: Thank you for choosing [facility] to provide your oncology and hematology care.   If you have a lab appointment with the Cancer Center, please go directly to the Cancer Center and check in at the registration area.   Wear comfortable clothing and clothing appropriate for easy access to any Portacath or PICC line.   We strive to give you quality time with your provider. You may need to reschedule your appointment if you arrive late (15 or more minutes).  Arriving late affects you and other patients whose appointments are after yours.  Also, if you miss three or more appointments without notifying the office, you may be dismissed from the clinic at the provider's discretion.      For prescription refill requests, have your pharmacy contact our office and allow 72 hours for refills to be completed.    Today you received the following chemotherapy and/or immunotherapy agents herceptin      To help prevent nausea and vomiting after your treatment, we encourage you to take your nausea medication as directed.  BELOW ARE SYMPTOMS THAT SHOULD BE REPORTED IMMEDIATELY: *FEVER GREATER THAN 100.4 F (38 C) OR HIGHER *CHILLS OR SWEATING *NAUSEA AND VOMITING THAT IS NOT CONTROLLED WITH YOUR NAUSEA MEDICATION *UNUSUAL SHORTNESS OF BREATH *UNUSUAL BRUISING OR BLEEDING *URINARY PROBLEMS (pain or burning when urinating, or frequent urination) *BOWEL PROBLEMS (unusual diarrhea, constipation, pain near the anus) TENDERNESS IN MOUTH AND THROAT WITH OR WITHOUT PRESENCE OF ULCERS (sore throat, sores in mouth, or a toothache) UNUSUAL RASH, SWELLING OR PAIN  UNUSUAL VAGINAL DISCHARGE OR ITCHING   Items with * indicate a potential emergency and should be followed up as soon as possible or go to the Emergency Department if any problems should occur.  Please show the CHEMOTHERAPY ALERT CARD or IMMUNOTHERAPY ALERT CARD at  check-in to the Emergency Department and triage nurse.  Should you have questions after your visit or need to cancel or reschedule your appointment, please contact [facility]  Dept: [phone]  and follow the prompts.  Office hours are 8:00 a.m. to 4:30 p.m. Monday - Friday. Please note that voicemails left after 4:00 p.m. may not be returned until the following business day.  We are closed weekends and major holidays. You have access to a nurse at all times for urgent questions. Please call the main number to the clinic Dept: [phone] and follow the prompts.   For any non-urgent questions, you may also contact your provider using MyChart. We now offer e-Visits for anyone 18 and older to request care online for non-urgent symptoms. For details visit [email or web address].   Also download the MyChart app! Go to the app store, search "MyChart", open the app, select [place], and log in with your MyChart username and password.   

## 2022-09-28 NOTE — Assessment & Plan Note (Addendum)
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  Joint pains and stiffness:on letrozole 2. hot flashes 3. Diarrhea from Perjeta: Resolved since we stopped Perjeta   Continue with Herceptin maintenance monthly   Zometa to every 6 months.   Liver lesion: PET/CT 03/14/2022: No evidence of hypermetabolic metastatic disease in the chest abdomen or pelvis.  Cystic lesions within the liver without any hypermetabolism, scattered sclerotic lesions treated bone metastases. Based on these findings we do not see that the liver lesions are metastatic sites and therefore we will watch and monitor them.   Itching of the skin of forearms and joint pain: Much improved since she stopped her letrozole.   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  Brain MRI 06/08/2022: Benign  Urinary frequency urgency and leakage and a left kidney stone: I will refer the patient to urology.  Weight gain: Patient is contemplating on GLP-1 antagonist.  I discussed with her that it is perfectly fine from our standpoint to proceed with that treatment.   Follow-up in 3 weeks for Herceptin every 6 weeks of follow-up with me.

## 2022-09-29 ENCOUNTER — Other Ambulatory Visit: Payer: Self-pay

## 2022-09-29 ENCOUNTER — Telehealth: Payer: Self-pay

## 2022-09-29 DIAGNOSIS — Z5181 Encounter for therapeutic drug level monitoring: Secondary | ICD-10-CM

## 2022-09-29 NOTE — Telephone Encounter (Signed)
Pt was called and advised to call CV proc dept [phone] to schedule echo before next tx. Per MD all orders for upcoming echos ordered. Pt verbalized agreement and understanding.

## 2022-09-30 ENCOUNTER — Other Ambulatory Visit: Payer: Self-pay

## 2022-10-01 ENCOUNTER — Telehealth: Payer: Self-pay | Admitting: Hematology and Oncology

## 2022-10-01 NOTE — Telephone Encounter (Signed)
 Rescheduled appointment per patients request via incoming call. Patient is aware of the changes made to her upcoming appointment.

## 2022-10-13 ENCOUNTER — Ambulatory Visit (HOSPITAL_COMMUNITY): Admission: RE | Admit: 2022-10-13 | Payer: Commercial Managed Care - PPO | Source: Ambulatory Visit

## 2022-10-13 DIAGNOSIS — Z79899 Other long term (current) drug therapy: Secondary | ICD-10-CM | POA: Insufficient documentation

## 2022-10-13 DIAGNOSIS — Z5181 Encounter for therapeutic drug level monitoring: Secondary | ICD-10-CM | POA: Diagnosis present

## 2022-10-13 DIAGNOSIS — Z9221 Personal history of antineoplastic chemotherapy: Secondary | ICD-10-CM | POA: Diagnosis not present

## 2022-10-14 LAB — ECHOCARDIOGRAM COMPLETE
AR max vel: 2.2 cm2
AV Area VTI: 2.17 cm2
AV Area mean vel: 1.89 cm2
AV Mean grad: 6 mmHg
AV Peak grad: 8.1 mmHg
Ao pk vel: 1.42 m/s
Area-P 1/2: 3.19 cm2
S' Lateral: 3.2 cm

## 2022-10-26 ENCOUNTER — Ambulatory Visit: Payer: Commercial Managed Care - PPO

## 2022-10-26 ENCOUNTER — Inpatient Hospital Stay: Payer: Commercial Managed Care - PPO | Attending: Hematology and Oncology

## 2022-10-26 VITALS — BP 125/76 | HR 62 | Temp 98.5°F | Resp 16

## 2022-10-26 DIAGNOSIS — Z5112 Encounter for antineoplastic immunotherapy: Secondary | ICD-10-CM | POA: Insufficient documentation

## 2022-10-26 DIAGNOSIS — C50511 Malignant neoplasm of lower-outer quadrant of right female breast: Secondary | ICD-10-CM | POA: Insufficient documentation

## 2022-10-26 DIAGNOSIS — Z170 Estrogen receptor positive status [ER+]: Secondary | ICD-10-CM | POA: Insufficient documentation

## 2022-10-26 MED ORDER — SODIUM CHLORIDE 0.9 % IV SOLN: Freq: Once | INTRAVENOUS | Status: AC

## 2022-10-26 MED ORDER — SODIUM CHLORIDE 0.9% FLUSH
10.0000 mL | INTRAVENOUS | Status: DC | PRN
  Administered 2022-10-26: 10 mL

## 2022-10-26 MED ORDER — TRASTUZUMAB-ANNS CHEMO 150 MG IV SOLR
6.0000 mg/kg | Freq: Once | INTRAVENOUS | Status: AC
  Administered 2022-10-26: 504 mg via INTRAVENOUS
  Filled 2022-10-26: qty 24

## 2022-10-26 MED ORDER — HEPARIN SOD (PORK) LOCK FLUSH 100 UNIT/ML IV SOLN
500.0000 [IU] | Freq: Once | INTRAVENOUS | Status: AC | PRN
  Administered 2022-10-26: 500 [IU]

## 2022-10-26 NOTE — Progress Notes (Signed)
Patient report she took Tylenol at home prior to arrival. Reports she does not take [place] as a premed.

## 2022-11-22 ENCOUNTER — Other Ambulatory Visit: Payer: Self-pay | Admitting: *Deleted

## 2022-11-22 DIAGNOSIS — C50511 Malignant neoplasm of lower-outer quadrant of right female breast: Secondary | ICD-10-CM

## 2022-11-23 ENCOUNTER — Inpatient Hospital Stay (HOSPITAL_BASED_OUTPATIENT_CLINIC_OR_DEPARTMENT_OTHER): Payer: Commercial Managed Care - PPO | Admitting: Hematology and Oncology

## 2022-11-23 ENCOUNTER — Inpatient Hospital Stay: Payer: Commercial Managed Care - PPO | Attending: Hematology and Oncology

## 2022-11-23 ENCOUNTER — Inpatient Hospital Stay: Payer: Commercial Managed Care - PPO

## 2022-11-23 VITALS — BP 131/73 | HR 75 | Temp 97.5°F | Resp 18 | Ht 67.0 in | Wt 184.0 lb

## 2022-11-23 DIAGNOSIS — C50511 Malignant neoplasm of lower-outer quadrant of right female breast: Secondary | ICD-10-CM | POA: Diagnosis not present

## 2022-11-23 DIAGNOSIS — M899 Disorder of bone, unspecified: Secondary | ICD-10-CM

## 2022-11-23 DIAGNOSIS — Z79620 Long term (current) use of immunosuppressive biologic: Secondary | ICD-10-CM | POA: Insufficient documentation

## 2022-11-23 DIAGNOSIS — Z7189 Other specified counseling: Secondary | ICD-10-CM

## 2022-11-23 DIAGNOSIS — Z95828 Presence of other vascular implants and grafts: Secondary | ICD-10-CM

## 2022-11-23 DIAGNOSIS — Z170 Estrogen receptor positive status [ER+]: Secondary | ICD-10-CM | POA: Diagnosis not present

## 2022-11-23 DIAGNOSIS — Z5112 Encounter for antineoplastic immunotherapy: Secondary | ICD-10-CM | POA: Insufficient documentation

## 2022-11-23 DIAGNOSIS — Z5189 Encounter for other specified aftercare: Secondary | ICD-10-CM | POA: Diagnosis not present

## 2022-11-23 DIAGNOSIS — C7951 Secondary malignant neoplasm of bone: Secondary | ICD-10-CM | POA: Diagnosis not present

## 2022-11-23 LAB — CBC WITH DIFFERENTIAL (CANCER CENTER ONLY)
Abs Immature Granulocytes: 0.03 10*3/uL (ref 0.00–0.07)
Basophils Absolute: 0 10*3/uL (ref 0.0–0.1)
Basophils Relative: 1 %
Eosinophils Absolute: 0.2 10*3/uL (ref 0.0–0.5)
Eosinophils Relative: 3 %
HCT: 42.1 % (ref 36.0–46.0)
Hemoglobin: 13.8 g/dL (ref 12.0–15.0)
Immature Granulocytes: 1 %
Lymphocytes Relative: 39 %
Lymphs Abs: 2.4 10*3/uL (ref 0.7–4.0)
MCH: 31.1 pg (ref 26.0–34.0)
MCHC: 32.8 g/dL (ref 30.0–36.0)
MCV: 94.8 fL (ref 80.0–100.0)
Monocytes Absolute: 0.4 10*3/uL (ref 0.1–1.0)
Monocytes Relative: 6 %
Neutro Abs: 3.2 10*3/uL (ref 1.7–7.7)
Neutrophils Relative %: 50 %
Platelet Count: 220 10*3/uL (ref 150–400)
RBC: 4.44 MIL/uL (ref 3.87–5.11)
RDW: 12.3 % (ref 11.5–15.5)
WBC Count: 6.3 10*3/uL (ref 4.0–10.5)
nRBC: 0 % (ref 0.0–0.2)

## 2022-11-23 LAB — CMP (CANCER CENTER ONLY)
ALT: 20 U/L (ref 0–44)
AST: 19 U/L (ref 15–41)
Albumin: 4.6 g/dL (ref 3.5–5.0)
Alkaline Phosphatase: 95 U/L (ref 38–126)
Anion gap: 6 (ref 5–15)
BUN: 17 mg/dL (ref 6–20)
CO2: 31 mmol/L (ref 22–32)
Calcium: 10.3 mg/dL (ref 8.9–10.3)
Chloride: 106 mmol/L (ref 98–111)
Creatinine: 0.99 mg/dL (ref 0.44–1.00)
GFR, Estimated: >60 mL/min (ref >60)
Glucose, Bld: 105 mg/dL — ABNORMAL HIGH (ref 70–99)
Potassium: 4.1 mmol/L (ref 3.5–5.1)
Sodium: 143 mmol/L (ref 135–145)
Total Bilirubin: 0.4 mg/dL (ref 0.3–1.2)
Total Protein: 7.5 g/dL (ref 6.5–8.1)

## 2022-11-23 LAB — MAGNESIUM: Magnesium: 2.3 mg/dL (ref 1.7–2.4)

## 2022-11-23 MED ORDER — SODIUM CHLORIDE 0.9% FLUSH
10.0000 mL | INTRAVENOUS | Status: DC | PRN
  Administered 2022-11-23: 10 mL

## 2022-11-23 MED ORDER — TRASTUZUMAB-ANNS CHEMO 150 MG IV SOLR
6.0000 mg/kg | Freq: Once | INTRAVENOUS | Status: AC
  Administered 2022-11-23: 504 mg via INTRAVENOUS
  Filled 2022-11-23: qty 24

## 2022-11-23 MED ORDER — DIPHENHYDRAMINE HCL 25 MG PO CAPS: 50.0000 mg | ORAL_CAPSULE | Freq: Once | ORAL | Status: DC

## 2022-11-23 MED ORDER — ACETAMINOPHEN 325 MG PO TABS: 650.0000 mg | ORAL_TABLET | Freq: Once | ORAL | Status: DC

## 2022-11-23 MED ORDER — IPRATROPIUM BROMIDE 0.06 % NA SOLN: 1.0000 | Freq: Three times a day (TID) | NASAL | 3 refills | Status: DC

## 2022-11-23 MED ORDER — ZOLEDRONIC ACID 4 MG/100ML IV SOLN
4.0000 mg | Freq: Once | INTRAVENOUS | Status: AC
  Administered 2022-11-23: 4 mg via INTRAVENOUS
  Filled 2022-11-23: qty 100

## 2022-11-23 MED ORDER — HEPARIN SOD (PORK) LOCK FLUSH 100 UNIT/ML IV SOLN
500.0000 [IU] | Freq: Once | INTRAVENOUS | Status: AC | PRN
  Administered 2022-11-23: 500 [IU]

## 2022-11-23 MED ORDER — ALTEPLASE 2 MG IJ SOLR
2.0000 mg | Freq: Once | INTRAMUSCULAR | Status: AC
  Administered 2022-11-23: 2 mg
  Filled 2022-11-23: qty 2

## 2022-11-23 MED ORDER — SODIUM CHLORIDE 0.9 % IV SOLN: Freq: Once | INTRAVENOUS | Status: AC

## 2022-11-23 NOTE — Progress Notes (Signed)
Pt states she took tylenol at home prior to arrival and does not take Benadryl as a pre-med.

## 2022-11-23 NOTE — Patient Instructions (Signed)
[place] [facility]  Discharge Instructions: Thank you for choosing [facility] to provide your oncology and hematology care.   If you have a lab appointment with the Cancer Center, please go directly to the Cancer Center and check in at the registration area.   Wear comfortable clothing and clothing appropriate for easy access to any Portacath or PICC line.   We strive to give you quality time with your provider. You may need to reschedule your appointment if you arrive late (15 or more minutes).  Arriving late affects you and other patients whose appointments are after yours.  Also, if you miss three or more appointments without notifying the office, you may be dismissed from the clinic at the provider's discretion.      For prescription refill requests, have your pharmacy contact our office and allow 72 hours for refills to be completed.    Today you received the following chemotherapy and/or immunotherapy agents: Kanjinti.       To help prevent nausea and vomiting after your treatment, we encourage you to take your nausea medication as directed.  BELOW ARE SYMPTOMS THAT SHOULD BE REPORTED IMMEDIATELY: *FEVER GREATER THAN 100.4 F (38 C) OR HIGHER *CHILLS OR SWEATING *NAUSEA AND VOMITING THAT IS NOT CONTROLLED WITH YOUR NAUSEA MEDICATION *UNUSUAL SHORTNESS OF BREATH *UNUSUAL BRUISING OR BLEEDING *URINARY PROBLEMS (pain or burning when urinating, or frequent urination) *BOWEL PROBLEMS (unusual diarrhea, constipation, pain near the anus) TENDERNESS IN MOUTH AND THROAT WITH OR WITHOUT PRESENCE OF ULCERS (sore throat, sores in mouth, or a toothache) UNUSUAL RASH, SWELLING OR PAIN  UNUSUAL VAGINAL DISCHARGE OR ITCHING   Items with * indicate a potential emergency and should be followed up as soon as possible or go to the Emergency Department if any problems should occur.  Please show the CHEMOTHERAPY ALERT CARD or IMMUNOTHERAPY ALERT CARD at  check-in to the Emergency Department and triage nurse.  Should you have questions after your visit or need to cancel or reschedule your appointment, please contact [place] [facility]  Dept: [phone]  and follow the prompts.  Office hours are 8:00 a.m. to 4:30 p.m. Monday - Friday. Please note that voicemails left after 4:00 p.m. may not be returned until the following business day.  We are closed weekends and major holidays. You have access to a nurse at all times for urgent questions. Please call the main number to the clinic Dept: [phone] and follow the prompts.   For any non-urgent questions, you may also contact your provider using MyChart. We now offer e-Visits for anyone 10 and older to request care online for non-urgent symptoms. For details visit [email or web address].   Also download the MyChart app! Go to the app store, search "MyChart", open the app, select [facility], and log in with your MyChart username and password.  Zoledronic Acid Injection (Cancer) What is this medication? ZOLEDRONIC ACID (ZOE le dron ik AS id) treats high calcium levels in the blood caused by cancer. It may also be used with chemotherapy to treat weakened bones caused by cancer. It works by slowing down the release of calcium from bones. This lowers calcium levels in your blood. It also makes your bones stronger and less likely to break (fracture). It belongs to a group of medications called bisphosphonates. This medicine may be used for other purposes; ask your health care provider or pharmacist if you have questions. COMMON BRAND NAME(S): Zometa, Zometa Powder What  should I tell my care team before I take this medication? They need to know if you have any of these conditions: Dehydration Dental disease Kidney disease Liver disease Low levels of calcium in the blood Lung or breathing disease, such as asthma Receiving steroids, such as dexamethasone or prednisone An  unusual or allergic reaction to zoledronic acid, other medications, foods, dyes, or preservatives Pregnant or trying to get pregnant Breast-feeding How should I use this medication? This medication is injected into a vein. It is given by your care team in a hospital or clinic setting. Talk to your care team about the use of this medication in children. Special care may be needed. Overdosage: If you think you have taken too much of this medicine contact a poison control center or emergency room at once. NOTE: This medicine is only for you. Do not share this medicine with others. What if I miss a dose? Keep appointments for follow-up doses. It is important not to miss your dose. Call your care team if you are unable to keep an appointment. What may interact with this medication? Certain antibiotics given by injection Diuretics, such as bumetanide, furosemide NSAIDs, medications for pain and inflammation, such as ibuprofen or naproxen Teriparatide Thalidomide This list may not describe all possible interactions. Give your health care provider a list of all the medicines, herbs, non-prescription drugs, or dietary supplements you use. Also tell them if you smoke, drink alcohol, or use illegal drugs. Some items may interact with your medicine. What should I watch for while using this medication? Visit your care team for regular checks on your progress. It may be some time before you see the benefit from this medication. Some people who take this medication have severe bone, joint, or muscle pain. This medication may also increase your risk for jaw problems or a broken thigh bone. Tell your care team right away if you have severe pain in your jaw, bones, joints, or muscles. Tell you care team if you have any pain that does not go away or that gets worse. Tell your dentist and dental surgeon that you are taking this medication. You should not have major dental surgery while on this medication. See your  dentist to have a dental exam and fix any dental problems before starting this medication. Take good care of your teeth while on this medication. Make sure you see your dentist for regular follow-up appointments. You should make sure you get enough calcium and vitamin D while you are taking this medication. Discuss the foods you eat and the vitamins you take with your care team. Check with your care team if you have severe diarrhea, nausea, and vomiting, or if you sweat a lot. The loss of too much body fluid may make it dangerous for you to take this medication. You may need bloodwork while taking this medication. Talk to your care team if you wish to become pregnant or think you might be pregnant. This medication can cause serious birth defects. What side effects may I notice from receiving this medication? Side effects that you should report to your care team as soon as possible: Allergic reactions--skin rash, itching, hives, swelling of the face, lips, tongue, or throat Kidney injury--decrease in the amount of urine, swelling of the ankles, hands, or feet Low calcium level--muscle pain or cramps, confusion, tingling, or numbness in the hands or feet Osteonecrosis of the jaw--pain, swelling, or redness in the mouth, numbness of the jaw, poor healing after dental work, unusual  discharge from the mouth, visible bones in the mouth Severe bone, joint, or muscle pain Side effects that usually do not require medical attention (report to your care team if they continue or are bothersome): Constipation Fatigue Fever Loss of appetite Nausea Stomach pain This list may not describe all possible side effects. Call your doctor for medical advice about side effects. You may report side effects to FDA at 1-800-FDA-1088. Where should I keep my medication? This medication is given in a hospital or clinic. It will not be stored at home. NOTE: This sheet is a summary. It may not cover all possible information.  If you have questions about this medicine, talk to your doctor, pharmacist, or health care provider.  2024 Elsevier/Gold Standard (2021-03-27 00:00:00)

## 2022-11-23 NOTE — Assessment & Plan Note (Signed)
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  Joint pains and stiffness:on letrozole 2. hot flashes 3. Diarrhea from Perjeta: Resolved since we stopped Perjeta   Brain: 06/08/2022: Negative Continue with Herceptin maintenance monthly   Zometa to every 6 months.

## 2022-11-24 ENCOUNTER — Encounter: Payer: Self-pay | Admitting: Hematology and Oncology

## 2022-11-24 NOTE — Progress Notes (Signed)
Patient Care Team: Exie Parody as PCP - General (Physician Assistant) Marcelle Overlie, MD as Consulting Physician (Obstetrics and Gynecology) Griselda Miner, MD as Consulting Physician (General Surgery) Antony Blackbird, MD as Consulting Physician (Radiation Oncology) Laurey Morale, MD as Consulting Physician (Cardiology) Serena Croissant, MD as Medical Oncologist (Hematology and Oncology)  DIAGNOSIS:  Encounter Diagnosis  Name Primary?   Malignant neoplasm of lower-outer quadrant of right breast of female, estrogen receptor positive (HCC) Yes    SUMMARY OF ONCOLOGIC HISTORY: Oncology History  Malignant neoplasm of lower-outer quadrant of right breast of female, estrogen receptor positive (HCC)  03/22/2016 Surgery   left lumpectomy 03/22/2016 showing atypical lobular hyperplasia prophylactic tamoxifen taken from 04/2016 through 03/2019   06/06/2019 Genetic Testing   Negative genetic testing:  No pathogenic variants detected on the [facility] panel, ordered by Dr. Vincente Poli at [facility]. Two variants of uncertain significance (VUS) were detected - one in the BRIP1 gene called c.2863A>C and a second in the NBN gene called c.643C>T. The report date is 06/06/2019.  The [facility] gene panel offered by [employer] includes sequencing and deletion/duplication testing of the following 35 genes: APC, ATM, AXIN2, BARD1, BMPR1A, BRCA1, BRCA2, BRIP1, CDH1, CDK4, CDKN2A, CHEK2, EPCAM (large rearrangement only), HOXB13 (sequencing only), GALNT12, MLH1, MSH2, MSH3 (excluding repetitive portions of exon 1), MSH6, MUTYH, NBN, NTHL1, PALB2, PMS2, PTEN, RAD51C, RAD51D, RNF43, RPS20, SMAD4, STK11, and TP53. Sequencing was performed for select regions of POLE and POLD1, and large rearrangement analysis was performed for select regions of GREM1.     06/25/2020 Relapse/Recurrence   right breast lower outer quadrant biopsy 06/25/2020 shows a clinical T2 N1, stage IB  invasive ductal carcinoma, grade 2 or 3, triple positive, with an MIB-1 of 40%   07/02/2020 Cancer Staging   Staging form: Breast, AJCC 8th Edition - Clinical stage from 07/02/2020: Stage IV (cT2, cN1, cM1, G3, ER+, PR+, HER2+) - Signed by Lowella Dell, MD on 08/04/2020 Stage prefix: Initial diagnosis Histologic grading system: 3 grade system Laterality: Right Staged by: Pathologist and managing physician Stage used in treatment planning: Yes National guidelines used in treatment planning: Yes Type of national guideline used in treatment planning: NCCN   07/16/2020 - 09/30/2020 Chemotherapy   neoadjuvant chemotherapy consisting of docetaxel, carboplatin, trastuzumab and pertuzumab every 21 days x 4 cycles started 07/28/2020, last chemotherapy 09/30/2020       08/01/2020 PET scan   PET scan 08/01/2020 shows no hypermetabolism in the 1.2 cm right liver lesion; bony hypermetabolism consistent with known mets   08/19/2020 - 10/21/2020 Chemotherapy   Patient is on Treatment Plan : BREAST  Trastuzumab + Pertuzumab q21d      10/07/2020 Imaging    total spinal MRI 10/07/2020 shows stable to improved disease, no extraosseous extension, pathologic fracture, epidural or intracanalicular involvement MRI of the breast 10/19/2020 shows a complete imaging response in the breast, with new ring-enhancing lesions in the left breast with MRI guided biopsy 11/10/2020 showing no evidence of malignancy   11/11/2020 - 01/18/2022 Chemotherapy   Patient is on Treatment Plan : BREAST Trastuzumab + Pertuzumab q21d     11/11/2020 - 04/12/2022 Chemotherapy   Patient is on Treatment Plan : BREAST Trastuzumab IV (8/6) or SQ (600) D1 q21d     01/02/2021 Surgery   right lumpectomy with no sentinel lymph node sampling 01/02/2021 showed scattered foci of residual invasive ductal carcinoma, grade 2, the largest measuring 0.8 cm.  Margins were negative  01/02/2021 -  Anti-estrogen oral therapy   anastrozole started  01/12/2021   05/11/2022 -  Chemotherapy   Patient is on Treatment Plan : BREAST MAINTENANCE Trastuzumab IV (6) or SQ (600) D1 q28d x 13 cycles     Malignant neoplasm metastatic to bone (HCC)  08/04/2020 Initial Diagnosis   Bone metastases (HCC)   11/11/2020 - 01/18/2022 Chemotherapy   Patient is on Treatment Plan : BREAST Trastuzumab + Pertuzumab q21d     11/11/2020 - 04/12/2022 Chemotherapy   Patient is on Treatment Plan : BREAST Trastuzumab IV (8/6) or SQ (600) D1 q21d       CHIEF COMPLIANT: Herceptin maintenance therapy  Discussed the use of AI scribe software for clinical note transcription with the patient, who gave verbal consent to proceed.  History of Present Illness   The patient, with a history of breast cancer currently on Herceptin maintenance, presents with concerns about medication side effects and difficulty breathing. She recently discontinued antiestrogen therapy due to weight gain and joint pain. She has tried three different medications for her condition, with varying side effects. She expresses a willingness to try another medication, tamoxifen, in the future.  The patient also reports difficulty taking deep breaths, especially in the morning. This has affected her ability to sneeze quietly, which she used to be able to do. She is unsure if this is related to her medication or another issue.  In addition, the patient mentions concerns about her weight, stating she is the heaviest she has ever been. She also has a port for treatment, which has been difficult to access. She has had labs drawn today and is due for a treatment, which may need to be administered via IV if the port continues to be problematic.       ALLERGIES:  is allergic to ciprofloxacin, ciprofibrate, gabapentin, sulfa antibiotics, and venlafaxine.  MEDICATIONS:  Current Outpatient Medications  Medication Sig Dispense Refill   Biotin 5 MG CAPS Take 1 capsule (5 mg total) by mouth daily.  0   Boron 3 MG  CAPS Take by mouth.     Calcium 200 MG TABS      Calcium 500 MG tablet Take 600 mg by mouth.     Cholecalciferol (VITAMIN D) 10 MCG/ML LIQD      ipratropium (ATROVENT) 0.06 % nasal spray Place 1 spray into both nostrils 3 (three) times daily. 15 mL 3   lidocaine-prilocaine (EMLA) cream Apply topically continuous as needed. 30 g 1   loratadine (CLARITIN) 10 MG tablet Take 10 mg by mouth daily.     LUTEIN PO Take by mouth.     Menatetrenone (VITAMIN K2) 100 MCG TABS Take by mouth.     OVER THE COUNTER MEDICATION Take 1 capsule by mouth daily. Lion Mane Mushroom 1200mg      Pediatric Multivitamins-Fl (MULTIVITAMIN + FLUORIDE) 0.25 MG CHEW      Ubiquinol 50 MG CAPS Take by mouth daily.     VITAMIN D PO 50 mcg.     zinc sulfate 220 (50 Zn) MG capsule Take 220 mg by mouth daily.     No current facility-administered medications for this visit.    PHYSICAL EXAMINATION: ECOG PERFORMANCE STATUS: 1 - Symptomatic but completely ambulatory  Vitals:   11/23/22 1521  BP: 131/73  Pulse: 75  Resp: 18  Temp: (!) 97.5 F (36.4 C)  SpO2: 99%   Filed Weights   11/23/22 1521  Weight: 184 lb (83.5 kg)      LABORATORY  DATA:  I have reviewed the data as listed    Latest Ref Rng & Units 11/23/2022    3:10 PM 06/07/2022    2:28 PM 02/16/2022    8:48 AM  CMP  Glucose 70 - 99 mg/dL 161  93  096   BUN 6 - 20 mg/dL 17  15  16    Creatinine 0.44 - 1.00 mg/dL 0.45  4.09  8.11   Sodium 135 - 145 mmol/L 143  141  142   Potassium 3.5 - 5.1 mmol/L 4.1  4.2  3.7   Chloride 98 - 111 mmol/L 106  105  106   CO2 22 - 32 mmol/L 31  29  30    Calcium 8.9 - 10.3 mg/dL 91.4  78.2  9.9   Total Protein 6.5 - 8.1 g/dL 7.5  7.4  7.0   Total Bilirubin 0.3 - 1.2 mg/dL 0.4  0.4  0.3   Alkaline Phos 38 - 126 U/L 95  105  77   AST 15 - 41 U/L 19  19  18    ALT 0 - 44 U/L 20  20  24      Lab Results  Component Value Date   WBC 6.3 11/23/2022   HGB 13.8 11/23/2022   HCT 42.1 11/23/2022   MCV 94.8 11/23/2022   PLT 220  11/23/2022   NEUTROABS 3.2 11/23/2022    ASSESSMENT & PLAN:  Malignant neoplasm of lower-outer quadrant of right breast of female, estrogen receptor positive (HCC) Metastatic Breast cancer ER/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  Joint pains and stiffness:on letrozole 2. hot flashes 3. Diarrhea from Perjeta: Resolved since we stopped Perjeta   Brain: 06/08/2022: Negative Continue with Herceptin maintenance monthly   Zometa to every 6 months. ------------------------------------- Assessment and Plan    Breast Cancer Patient is currently on Herceptin, with treatment to be completed by February. Patient has tried three different medications (Anastrozole, Letrozole, and Exemestane) with side effects including weight gain and joint pain. Tamoxifen and Faslodex were discussed as future options. -Continue Herceptin until February. -Consider Tamoxifen at a low dose after completion of Herceptin. -Plan to discuss further after the holidays.  Breathing Difficulty Patient reports difficulty taking deep breaths, especially in the morning. No known lung or heart issues. -Continue to monitor symptoms.  Mammogram Patient has not completed her mammogram. -Send order for mammogram to patient's preferred location.       Orders Placed This Encounter  Procedures   MM DIAG BREAST TOMO BILATERAL    Standing Status:   Future    Standing Expiration Date:   11/23/2023    Order Specific Question:   Reason for Exam (SYMPTOM  OR DIAGNOSIS REQUIRED)    Answer:   Annual mammograms with H/O breast cancer    Order Specific Question:   Is the patient pregnant?    Answer:   No    Order Specific Question:   Preferred imaging location?    Answer:   [facility]    Order Specific Question:   Release to patient    Answer:    Immediate   The patient has a good understanding of the overall plan. she agrees with it. she will call with any problems that may develop before the next visit here. Total time spent: 30 mins including face to face time and  time spent for planning, charting and co-ordination of care   Tamsen Meek, MD 11/24/22

## 2022-11-25 ENCOUNTER — Other Ambulatory Visit: Payer: Self-pay

## 2022-12-08 ENCOUNTER — Ambulatory Visit
Admission: RE | Admit: 2022-12-08 | Discharge: 2022-12-08 | Disposition: A | Discharge status: Home / Self Care | Payer: Commercial Managed Care - PPO | Source: Ambulatory Visit | Attending: Hematology and Oncology | Admitting: Hematology and Oncology

## 2022-12-08 DIAGNOSIS — C50511 Malignant neoplasm of lower-outer quadrant of right female breast: Secondary | ICD-10-CM

## 2022-12-20 ENCOUNTER — Telehealth: Payer: Self-pay

## 2022-12-20 NOTE — Telephone Encounter (Signed)
Pt called and states she has tested positive for COVID. Reports post nasal drip, body aches, sore throat. Denies fever or chills. Per MD, pt should hold Kanjinti so we will cancel her appt for tomorrow. She will proceed as scheduled 12/3. She knows to call with any further concerns.

## 2022-12-21 ENCOUNTER — Inpatient Hospital Stay: Payer: Commercial Managed Care - PPO

## 2023-01-07 ENCOUNTER — Ambulatory Visit (HOSPITAL_COMMUNITY)
Admission: RE | Admit: 2023-01-07 | Discharge: 2023-01-07 | Disposition: A | Discharge status: Home / Self Care | Payer: Commercial Managed Care - PPO | Source: Ambulatory Visit | Attending: Hematology and Oncology | Admitting: Hematology and Oncology

## 2023-01-07 DIAGNOSIS — Z79899 Other long term (current) drug therapy: Secondary | ICD-10-CM | POA: Diagnosis present

## 2023-01-07 DIAGNOSIS — Z5181 Encounter for therapeutic drug level monitoring: Secondary | ICD-10-CM | POA: Insufficient documentation

## 2023-01-07 DIAGNOSIS — C50919 Malignant neoplasm of unspecified site of unspecified female breast: Secondary | ICD-10-CM | POA: Diagnosis not present

## 2023-01-07 LAB — ECHOCARDIOGRAM COMPLETE
Area-P 1/2: 3.21 cm2
Calc EF: 53.9 %
S' Lateral: 3.1 cm
Single Plane A2C EF: 54.7 %
Single Plane A4C EF: 55 %

## 2023-01-07 NOTE — Progress Notes (Signed)
  Echocardiogram 2D Echocardiogram has been performed.  Milda Smart 01/07/2023, 9:50 AM

## 2023-01-17 NOTE — Assessment & Plan Note (Signed)
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  Joint pains and stiffness:on letrozole 2. hot flashes 3. Diarrhea from Perjeta: Resolved since we stopped Perjeta   Brain: 06/08/2022: Negative Continue with Herceptin maintenance monthly   Zometa to every 6 months.  Patient has tried three different medications (Anastrozole, Letrozole, and Exemestane) with side effects including weight gain and joint pain. Tamoxifen and Faslodex were discussed as future options. -Continue Herceptin until February. -Consider Tamoxifen at a low dose after completion of Herceptin.  Breast cancer surveillance: Mammogram 12/08/2022: Benign breast density category C

## 2023-01-18 ENCOUNTER — Inpatient Hospital Stay: Payer: Commercial Managed Care - PPO | Attending: Hematology and Oncology

## 2023-01-18 ENCOUNTER — Inpatient Hospital Stay (HOSPITAL_BASED_OUTPATIENT_CLINIC_OR_DEPARTMENT_OTHER): Payer: Commercial Managed Care - PPO | Admitting: Hematology and Oncology

## 2023-01-18 VITALS — BP 109/61 | HR 77 | Temp 97.9°F | Resp 18 | Ht 67.0 in | Wt 182.7 lb

## 2023-01-18 DIAGNOSIS — Z5112 Encounter for antineoplastic immunotherapy: Secondary | ICD-10-CM | POA: Diagnosis present

## 2023-01-18 DIAGNOSIS — C50511 Malignant neoplasm of lower-outer quadrant of right female breast: Secondary | ICD-10-CM

## 2023-01-18 DIAGNOSIS — Z79620 Long term (current) use of immunosuppressive biologic: Secondary | ICD-10-CM | POA: Insufficient documentation

## 2023-01-18 DIAGNOSIS — Z170 Estrogen receptor positive status [ER+]: Secondary | ICD-10-CM | POA: Diagnosis not present

## 2023-01-18 DIAGNOSIS — C7951 Secondary malignant neoplasm of bone: Secondary | ICD-10-CM | POA: Insufficient documentation

## 2023-01-18 MED ORDER — SODIUM CHLORIDE 0.9 % IV SOLN: Freq: Once | INTRAVENOUS | Status: AC

## 2023-01-18 MED ORDER — DIPHENHYDRAMINE HCL 25 MG PO CAPS: 50.0000 mg | ORAL_CAPSULE | Freq: Once | ORAL | Status: DC

## 2023-01-18 MED ORDER — ACETAMINOPHEN 325 MG PO TABS: 650.0000 mg | ORAL_TABLET | Freq: Once | ORAL | Status: DC

## 2023-01-18 MED ORDER — HEPARIN SOD (PORK) LOCK FLUSH 100 UNIT/ML IV SOLN
500.0000 [IU] | Freq: Once | INTRAVENOUS | Status: AC | PRN
  Administered 2023-01-18: 500 [IU]

## 2023-01-18 MED ORDER — TRASTUZUMAB-ANNS CHEMO 150 MG IV SOLR
6.0000 mg/kg | Freq: Once | INTRAVENOUS | Status: AC
  Administered 2023-01-18: 504 mg via INTRAVENOUS
  Filled 2023-01-18: qty 24

## 2023-01-18 MED ORDER — SODIUM CHLORIDE 0.9% FLUSH
10.0000 mL | INTRAVENOUS | Status: DC | PRN
  Administered 2023-01-18: 10 mL

## 2023-01-18 NOTE — Patient Instructions (Signed)
[facility] - A DEPT OF [facility]  Discharge Instructions: Thank you for choosing [facility] to provide your oncology and hematology care.   If you have a lab appointment with the Cancer Center, please go directly to the Cancer Center and check in at the registration area.   Wear comfortable clothing and clothing appropriate for easy access to any Portacath or PICC line.   We strive to give you quality time with your provider. You may need to reschedule your appointment if you arrive late (15 or more minutes).  Arriving late affects you and other patients whose appointments are after yours.  Also, if you miss three or more appointments without notifying the office, you may be dismissed from the clinic at the provider's discretion.      For prescription refill requests, have your pharmacy contact our office and allow 72 hours for refills to be completed.    Today you received the following chemotherapy and/or immunotherapy agents: Kanjinti.       To help prevent nausea and vomiting after your treatment, we encourage you to take your nausea medication as directed.  BELOW ARE SYMPTOMS THAT SHOULD BE REPORTED IMMEDIATELY: *FEVER GREATER THAN 100.4 F (38 C) OR HIGHER *CHILLS OR SWEATING *NAUSEA AND VOMITING THAT IS NOT CONTROLLED WITH YOUR NAUSEA MEDICATION *UNUSUAL SHORTNESS OF BREATH *UNUSUAL BRUISING OR BLEEDING *URINARY PROBLEMS (pain or burning when urinating, or frequent urination) *BOWEL PROBLEMS (unusual diarrhea, constipation, pain near the anus) TENDERNESS IN MOUTH AND THROAT WITH OR WITHOUT PRESENCE OF ULCERS (sore throat, sores in mouth, or a toothache) UNUSUAL RASH, SWELLING OR PAIN  UNUSUAL VAGINAL DISCHARGE OR ITCHING   Items with * indicate a potential emergency and should be followed up as soon as possible or go to the Emergency Department if any problems should occur.  Please show the CHEMOTHERAPY ALERT CARD or IMMUNOTHERAPY  ALERT CARD at check-in to the Emergency Department and triage nurse.  Should you have questions after your visit or need to cancel or reschedule your appointment, please contact [facility] - A DEPT OF [facility]  Dept: [phone]  and follow the prompts.  Office hours are 8:00 a.m. to 4:30 p.m. Monday - Friday. Please note that voicemails left after 4:00 p.m. may not be returned until the following business day.  We are closed weekends and major holidays. You have access to a nurse at all times for urgent questions. Please call the main number to the clinic Dept: [phone] and follow the prompts.   For any non-urgent questions, you may also contact your provider using MyChart. We now offer e-Visits for anyone 70 and older to request care online for non-urgent symptoms. For details visit [email or web address].   Also download the MyChart app! Go to the app store, search "MyChart", open the app, select [facility], and log in with your MyChart username and password.  Zoledronic Acid Injection (Cancer) What is this medication? ZOLEDRONIC ACID (ZOE le dron ik AS id) treats high calcium levels in the blood caused by cancer. It may also be used with chemotherapy to treat weakened bones caused by cancer. It works by slowing down the release of calcium from bones. This lowers calcium levels in your blood. It also makes your bones stronger and less likely to break (fracture). It belongs to a group of medications called bisphosphonates. This medicine may be used for other purposes; ask your health care  provider or pharmacist if you have questions. COMMON BRAND NAME(S): Zometa, Zometa Powder What should I tell my care team before I take this medication? They need to know if you have any of these conditions: Dehydration Dental disease Kidney disease Liver disease Low levels of calcium in the blood Lung or breathing disease, such as asthma Receiving steroids, such as  dexamethasone or prednisone An unusual or allergic reaction to zoledronic acid, other medications, foods, dyes, or preservatives Pregnant or trying to get pregnant Breast-feeding How should I use this medication? This medication is injected into a vein. It is given by your care team in a hospital or clinic setting. Talk to your care team about the use of this medication in children. Special care may be needed. Overdosage: If you think you have taken too much of this medicine contact a poison control center or emergency room at once. NOTE: This medicine is only for you. Do not share this medicine with others. What if I miss a dose? Keep appointments for follow-up doses. It is important not to miss your dose. Call your care team if you are unable to keep an appointment. What may interact with this medication? Certain antibiotics given by injection Diuretics, such as bumetanide, furosemide NSAIDs, medications for pain and inflammation, such as ibuprofen or naproxen Teriparatide Thalidomide This list may not describe all possible interactions. Give your health care provider a list of all the medicines, herbs, non-prescription drugs, or dietary supplements you use. Also tell them if you smoke, drink alcohol, or use illegal drugs. Some items may interact with your medicine. What should I watch for while using this medication? Visit your care team for regular checks on your progress. It may be some time before you see the benefit from this medication. Some people who take this medication have severe bone, joint, or muscle pain. This medication may also increase your risk for jaw problems or a broken thigh bone. Tell your care team right away if you have severe pain in your jaw, bones, joints, or muscles. Tell you care team if you have any pain that does not go away or that gets worse. Tell your dentist and dental surgeon that you are taking this medication. You should not have major dental surgery  while on this medication. See your dentist to have a dental exam and fix any dental problems before starting this medication. Take good care of your teeth while on this medication. Make sure you see your dentist for regular follow-up appointments. You should make sure you get enough calcium and vitamin D while you are taking this medication. Discuss the foods you eat and the vitamins you take with your care team. Check with your care team if you have severe diarrhea, nausea, and vomiting, or if you sweat a lot. The loss of too much body fluid may make it dangerous for you to take this medication. You may need bloodwork while taking this medication. Talk to your care team if you wish to become pregnant or think you might be pregnant. This medication can cause serious birth defects. What side effects may I notice from receiving this medication? Side effects that you should report to your care team as soon as possible: Allergic reactions--skin rash, itching, hives, swelling of the face, lips, tongue, or throat Kidney injury--decrease in the amount of urine, swelling of the ankles, hands, or feet Low calcium level--muscle pain or cramps, confusion, tingling, or numbness in the hands or feet Osteonecrosis of the jaw--pain, swelling, or  redness in the mouth, numbness of the jaw, poor healing after dental work, unusual discharge from the mouth, visible bones in the mouth Severe bone, joint, or muscle pain Side effects that usually do not require medical attention (report to your care team if they continue or are bothersome): Constipation Fatigue Fever Loss of appetite Nausea Stomach pain This list may not describe all possible side effects. Call your doctor for medical advice about side effects. You may report side effects to FDA at 1-800-FDA-1088. Where should I keep my medication? This medication is given in a hospital or clinic. It will not be stored at home. NOTE: This sheet is a summary. It may  not cover all possible information. If you have questions about this medicine, talk to your doctor, pharmacist, or health care provider.  2024 Elsevier/Gold Standard (2021-03-27 00:00:00)

## 2023-01-18 NOTE — Addendum Note (Signed)
Addended by: Serena Croissant on: 01/18/2023 03:04 PM   Modules accepted: Orders

## 2023-01-18 NOTE — Progress Notes (Signed)
Patient Care Team: Exie Parody as PCP - General (Physician Assistant) Marcelle Overlie, MD as Consulting Physician (Obstetrics and Gynecology) Griselda Miner, MD as Consulting Physician (General Surgery) Antony Blackbird, MD as Consulting Physician (Radiation Oncology) Laurey Morale, MD as Consulting Physician (Cardiology) Serena Croissant, MD as Medical Oncologist (Hematology and Oncology)  DIAGNOSIS:  Encounter Diagnoses  Name Primary?   Malignant neoplasm of lower-outer quadrant of right breast of female, estrogen receptor positive (HCC) Yes   Malignant neoplasm metastatic to bone (HCC)     SUMMARY OF ONCOLOGIC HISTORY: Oncology History  Malignant neoplasm of lower-outer quadrant of right breast of female, estrogen receptor positive (HCC)  03/22/2016 Surgery   left lumpectomy 03/22/2016 showing atypical lobular hyperplasia prophylactic tamoxifen taken from 04/2016 through 03/2019   06/06/2019 Genetic Testing   Negative genetic testing:  No pathogenic variants detected on the [facility] panel, ordered by Dr. Vincente Poli at [facility]. Two variants of uncertain significance (VUS) were detected - one in the BRIP1 gene called c.2863A>C and a second in the NBN gene called c.643C>T. The report date is 06/06/2019.  The [facility] gene panel offered by [employer] includes sequencing and deletion/duplication testing of the following 35 genes: APC, ATM, AXIN2, BARD1, BMPR1A, BRCA1, BRCA2, BRIP1, CDH1, CDK4, CDKN2A, CHEK2, EPCAM (large rearrangement only), HOXB13 (sequencing only), GALNT12, MLH1, MSH2, MSH3 (excluding repetitive portions of exon 1), MSH6, MUTYH, NBN, NTHL1, PALB2, PMS2, PTEN, RAD51C, RAD51D, RNF43, RPS20, SMAD4, STK11, and TP53. Sequencing was performed for select regions of POLE and POLD1, and large rearrangement analysis was performed for select regions of GREM1.     06/25/2020 Relapse/Recurrence   right breast lower outer quadrant  biopsy 06/25/2020 shows a clinical T2 N1, stage IB invasive ductal carcinoma, grade 2 or 3, triple positive, with an MIB-1 of 40%   07/02/2020 Cancer Staging   Staging form: Breast, AJCC 8th Edition - Clinical stage from 07/02/2020: Stage IV (cT2, cN1, cM1, G3, ER+, PR+, HER2+) - Signed by Lowella Dell, MD on 08/04/2020 Stage prefix: Initial diagnosis Histologic grading system: 3 grade system Laterality: Right Staged by: Pathologist and managing physician Stage used in treatment planning: Yes National guidelines used in treatment planning: Yes Type of national guideline used in treatment planning: NCCN   07/16/2020 - 09/30/2020 Chemotherapy   neoadjuvant chemotherapy consisting of docetaxel, carboplatin, trastuzumab and pertuzumab every 21 days x 4 cycles started 07/28/2020, last chemotherapy 09/30/2020       08/01/2020 PET scan   PET scan 08/01/2020 shows no hypermetabolism in the 1.2 cm right liver lesion; bony hypermetabolism consistent with known mets   08/19/2020 - 10/21/2020 Chemotherapy   Patient is on Treatment Plan : BREAST  Trastuzumab + Pertuzumab q21d      10/07/2020 Imaging    total spinal MRI 10/07/2020 shows stable to improved disease, no extraosseous extension, pathologic fracture, epidural or intracanalicular involvement MRI of the breast 10/19/2020 shows a complete imaging response in the breast, with new ring-enhancing lesions in the left breast with MRI guided biopsy 11/10/2020 showing no evidence of malignancy   11/11/2020 - 01/18/2022 Chemotherapy   Patient is on Treatment Plan : BREAST Trastuzumab + Pertuzumab q21d     11/11/2020 - 04/12/2022 Chemotherapy   Patient is on Treatment Plan : BREAST Trastuzumab IV (8/6) or SQ (600) D1 q21d     01/02/2021 Surgery   right lumpectomy with no sentinel lymph node sampling 01/02/2021 showed scattered foci of residual invasive ductal carcinoma, grade 2,  the largest measuring 0.8 cm.  Margins were negative   01/02/2021 -   Anti-estrogen oral therapy   anastrozole started 01/12/2021   05/11/2022 -  Chemotherapy   Patient is on Treatment Plan : BREAST MAINTENANCE Trastuzumab IV (6) or SQ (600) D1 q28d x 13 cycles     Malignant neoplasm metastatic to bone (HCC)  08/04/2020 Initial Diagnosis   Bone metastases (HCC)   11/11/2020 - 01/18/2022 Chemotherapy   Patient is on Treatment Plan : BREAST Trastuzumab + Pertuzumab q21d     11/11/2020 - 04/12/2022 Chemotherapy   Patient is on Treatment Plan : BREAST Trastuzumab IV (8/6) or SQ (600) D1 q21d       CHIEF COMPLIANT: Metastatic breast cancer on Herceptin  HISTORY OF PRESENT ILLNESS:   History of Present Illness   The patient, with a history of met breast cancer on herceptin, presents for a routine follow-up. She has been on semaglutide for weight loss and has noticed a slight decrease in weight from 184 to 182. She has been receiving herceptin infusions for cancer treatment since June 2022 and has not experienced any side effects.        ALLERGIES:  is allergic to ciprofloxacin, ciprofibrate, gabapentin, sulfa antibiotics, and venlafaxine.  MEDICATIONS:  Current Outpatient Medications  Medication Sig Dispense Refill   Biotin 5 MG CAPS Take 1 capsule (5 mg total) by mouth daily.  0   Boron 3 MG CAPS Take by mouth.     Calcium 200 MG TABS      Calcium 500 MG tablet Take 600 mg by mouth.     Cholecalciferol (VITAMIN D) 10 MCG/ML LIQD      ipratropium (ATROVENT) 0.06 % nasal spray Place 1 spray into both nostrils 3 (three) times daily. 15 mL 3   lidocaine-prilocaine (EMLA) cream Apply topically continuous as needed. 30 g 1   loratadine (CLARITIN) 10 MG tablet Take 10 mg by mouth daily.     LUTEIN PO Take by mouth.     Menatetrenone (VITAMIN K2) 100 MCG TABS Take by mouth.     OVER THE COUNTER MEDICATION Take 1 capsule by mouth daily. Lion Mane Mushroom 1200mg      Pediatric Multivitamins-Fl (MULTIVITAMIN + FLUORIDE) 0.25 MG CHEW      Ubiquinol 50 MG CAPS Take  by mouth daily.     VITAMIN D PO 50 mcg.     zinc sulfate 220 (50 Zn) MG capsule Take 220 mg by mouth daily.     No current facility-administered medications for this visit.    PHYSICAL EXAMINATION: ECOG PERFORMANCE STATUS: 1 - Symptomatic but completely ambulatory  Vitals:   01/18/23 1436  BP: 109/61  Pulse: 77  Resp: 18  Temp: 97.9 F (36.6 C)  SpO2: 98%   Filed Weights   01/18/23 1436  Weight: 182 lb 11.2 oz (82.9 kg)    Physical Exam   MEASUREMENTS: WT- 184 to 182        LABORATORY DATA:  I have reviewed the data as listed    Latest Ref Rng & Units 11/23/2022    3:10 PM 06/07/2022    2:28 PM 02/16/2022    8:48 AM  CMP  Glucose 70 - 99 mg/dL 161  93  096   BUN 6 - 20 mg/dL 17  15  16    Creatinine 0.44 - 1.00 mg/dL 0.45  4.09  8.11   Sodium 135 - 145 mmol/L 143  141  142   Potassium 3.5 - 5.1 mmol/L  4.1  4.2  3.7   Chloride 98 - 111 mmol/L 106  105  106   CO2 22 - 32 mmol/L 31  29  30    Calcium 8.9 - 10.3 mg/dL 73.2  20.2  9.9   Total Protein 6.5 - 8.1 g/dL 7.5  7.4  7.0   Total Bilirubin 0.3 - 1.2 mg/dL 0.4  0.4  0.3   Alkaline Phos 38 - 126 U/L 95  105  77   AST 15 - 41 U/L 19  19  18    ALT 0 - 44 U/L 20  20  24      Lab Results  Component Value Date   WBC 6.3 11/23/2022   HGB 13.8 11/23/2022   HCT 42.1 11/23/2022   MCV 94.8 11/23/2022   PLT 220 11/23/2022   NEUTROABS 3.2 11/23/2022    ASSESSMENT & PLAN:  Malignant neoplasm of lower-outer quadrant of right breast of female, estrogen receptor positive (HCC) Metastatic Breast cancer ER/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  Joint pains and stiffness:on letrozole 2. hot flashes 3. Diarrhea from Perjeta: Resolved since we stopped Perjeta   Brain: 06/08/2022: Negative Continue with Herceptin maintenance monthly   Zometa to every  6 months.  Patient has tried three different medications (Anastrozole, Letrozole, and Exemestane) with side effects including weight gain and joint pain. Tamoxifen and Faslodex were discussed as future options. -Continue Herceptin indefinitely -Consider Tamoxifen at a low dose starting in February  Breast cancer surveillance: Mammogram 12/08/2022: Benign breast density category C ------------------------------------- Assessment and Plan    Breast Cancer with Bone Metastases Stable on Herceptin infusions, with no new symptoms reported. Last PET scan in January 2024 showed no active findings. Discussed the possibility of continuing Herceptin indefinitely. -Schedule next Herceptin infusion every 4 weeks -Plan PET scan for 04/22/2023 to reassess disease status.  Weight Management On semaglutide 10mg , but no significant weight loss noted yet. Patient has had four injections so far. Discussed the possibility of increasing the dose. -Continue semaglutide 10mg . -Consider increasing the dose if no significant weight loss is observed.  Future Treatment Plan Discussed the possibility of starting low-dose tamoxifen after the holidays. -Discuss starting low-dose tamoxifen in the new year.          Orders Placed This Encounter  Procedures   NM PET Image Initial (PI) Skull Base To Thigh (F-18 FDG)    Standing Status:   Future    Standing Expiration Date:   01/18/2024    Order Specific Question:   If indicated for the ordered procedure, I authorize the administration of a radiopharmaceutical per Radiology protocol    Answer:   Yes    Order Specific Question:   Preferred imaging location?    Answer:   [facility]   The patient has a good understanding of the overall plan. she agrees with it. she will call with any problems that may develop before the next visit here. Total time spent: 30 mins including face to face time and time spent for planning, charting and co-ordination of care   Tamsen Meek, MD 01/18/23

## 2023-01-18 NOTE — Progress Notes (Signed)
Patient does not take benadryl and took tylenol at 1400 today.

## 2023-01-29 ENCOUNTER — Other Ambulatory Visit: Payer: Self-pay

## 2023-02-14 ENCOUNTER — Encounter: Payer: Self-pay | Admitting: Hematology and Oncology

## 2023-02-14 ENCOUNTER — Telehealth: Payer: Self-pay

## 2023-02-14 ENCOUNTER — Encounter: Payer: Self-pay | Admitting: Adult Health

## 2023-02-14 ENCOUNTER — Inpatient Hospital Stay (HOSPITAL_BASED_OUTPATIENT_CLINIC_OR_DEPARTMENT_OTHER): Payer: Commercial Managed Care - PPO | Admitting: Adult Health

## 2023-02-14 DIAGNOSIS — C50511 Malignant neoplasm of lower-outer quadrant of right female breast: Secondary | ICD-10-CM | POA: Diagnosis not present

## 2023-02-14 DIAGNOSIS — Z170 Estrogen receptor positive status [ER+]: Secondary | ICD-10-CM | POA: Diagnosis not present

## 2023-02-14 DIAGNOSIS — R399 Unspecified symptoms and signs involving the genitourinary system: Secondary | ICD-10-CM | POA: Diagnosis not present

## 2023-02-14 MED ORDER — NITROFURANTOIN MONOHYD MACRO 100 MG PO CAPS: 100.0000 mg | ORAL_CAPSULE | Freq: Two times a day (BID) | ORAL | 0 refills | Status: DC

## 2023-02-14 NOTE — Telephone Encounter (Signed)
Mrs. Fendt called and thinks she has the flu. She thinks a family member exposed her and the family member was tested for COVID, flu and RSV. The family member tested positive for the flu but negative for the others and that is why she thinks she also has the flu. Although she has not had a fever in several days, she wanted to make sure she should still come for her Kanjinti tomorrow. She did say she was very exhausted still. She does not have a lab appt or a provider visit tomorrow. Consulted with Lillard Anes to see if the patient needs to be seen first or should we defer treatment? She has Day 1 Cycle 10 maintenance Kanjinti scheduled tomorrow afternoon and her diagnosis Malignant neoplasm of lower-outer quadrant of right breast of female, estrogen receptor positive. Per Mardella Layman, scheduled a phone visit today at 315pm and patient is aware that Mardella Layman will call her. Lorayne Marek, RN

## 2023-02-14 NOTE — Progress Notes (Unsigned)
[facility] Cancer Follow up:    Kim Archer [address] [place] [address]   DIAGNOSIS: Cancer Staging  Malignant neoplasm of lower-outer quadrant of right breast of female, estrogen receptor positive (HCC) Staging form: Breast, AJCC 8th Edition - Clinical stage from 07/02/2020: Stage IV (cT2, cN1, cM1, G3, ER+, PR+, HER2+) - Signed by Lowella Dell, MD on 08/04/2020 Stage prefix: Initial diagnosis Histologic grading system: 3 grade system Laterality: Right Staged by: Pathologist and managing physician Stage used in treatment planning: Yes National guidelines used in treatment planning: Yes Type of national guideline used in treatment planning: NCCN  I connected with Kim Archer  on 02/14/23 at  3:15 PM EST by telephone and verified that I am speaking with the correct person using two identifiers.  I discussed the limitations, risks, security and privacy concerns of performing an evaluation and management service by telephone and the availability of in person appointments.  I also discussed with the patient that there may be a patient responsible charge related to this service. The patient expressed understanding and agreed to proceed.  Patient location: home Provider location: [facility] office  SUMMARY OF ONCOLOGIC HISTORY: Oncology History  Malignant neoplasm of lower-outer quadrant of right breast of female, estrogen receptor positive (HCC)  03/22/2016 Surgery   left lumpectomy 03/22/2016 showing atypical lobular hyperplasia prophylactic tamoxifen taken from 04/2016 through 03/2019   06/06/2019 Genetic Testing   Negative genetic testing:  No pathogenic variants detected on the [facility] panel, ordered by Dr. Vincente Poli at [facility]. Two variants of uncertain significance (VUS) were detected - one in the BRIP1 gene called c.2863A>C and a second in the NBN gene called c.643C>T. The report date is 06/06/2019.  The [facility]  gene panel offered by [employer] includes sequencing and deletion/duplication testing of the following 35 genes: APC, ATM, AXIN2, BARD1, BMPR1A, BRCA1, BRCA2, BRIP1, CDH1, CDK4, CDKN2A, CHEK2, EPCAM (large rearrangement only), HOXB13 (sequencing only), GALNT12, MLH1, MSH2, MSH3 (excluding repetitive portions of exon 1), MSH6, MUTYH, NBN, NTHL1, PALB2, PMS2, PTEN, RAD51C, RAD51D, RNF43, RPS20, SMAD4, STK11, and TP53. Sequencing was performed for select regions of POLE and POLD1, and large rearrangement analysis was performed for select regions of GREM1.     06/25/2020 Relapse/Recurrence   right breast lower outer quadrant biopsy 06/25/2020 shows a clinical T2 N1, stage IB invasive ductal carcinoma, grade 2 or 3, triple positive, with an MIB-1 of 40%   07/02/2020 Cancer Staging   Staging form: Breast, AJCC 8th Edition - Clinical stage from 07/02/2020: Stage IV (cT2, cN1, cM1, G3, ER+, PR+, HER2+) - Signed by Lowella Dell, MD on 08/04/2020 Stage prefix: Initial diagnosis Histologic grading system: 3 grade system Laterality: Right Staged by: Pathologist and managing physician Stage used in treatment planning: Yes National guidelines used in treatment planning: Yes Type of national guideline used in treatment planning: NCCN   07/16/2020 - 09/30/2020 Chemotherapy   neoadjuvant chemotherapy consisting of docetaxel, carboplatin, trastuzumab and pertuzumab every 21 days x 4 cycles started 07/28/2020, last chemotherapy 09/30/2020       08/01/2020 PET scan   PET scan 08/01/2020 shows no hypermetabolism in the 1.2 cm right liver lesion; bony hypermetabolism consistent with known mets   08/19/2020 - 10/21/2020 Chemotherapy   Patient is on Treatment Plan : BREAST  Trastuzumab + Pertuzumab q21d      10/07/2020 Imaging    total spinal MRI 10/07/2020 shows stable to improved disease, no extraosseous extension,  pathologic fracture, epidural or intracanalicular involvement MRI of the breast  10/19/2020 shows a complete imaging response in the breast, with new ring-enhancing lesions in the left breast with MRI guided biopsy 11/10/2020 showing no evidence of malignancy   11/11/2020 - 01/18/2022 Chemotherapy   Patient is on Treatment Plan : BREAST Trastuzumab + Pertuzumab q21d     11/11/2020 - 04/12/2022 Chemotherapy   Patient is on Treatment Plan : BREAST Trastuzumab IV (8/6) or SQ (600) D1 q21d     01/02/2021 Surgery   right lumpectomy with no sentinel lymph node sampling 01/02/2021 showed scattered foci of residual invasive ductal carcinoma, grade 2, the largest measuring 0.8 cm.  Margins were negative   01/02/2021 -  Anti-estrogen oral therapy   anastrozole started 01/12/2021   05/11/2022 -  Chemotherapy   Patient is on Treatment Plan : BREAST MAINTENANCE Trastuzumab IV (6) or SQ (600) D1 q28d x 13 cycles     Malignant neoplasm metastatic to bone (HCC)  08/04/2020 Initial Diagnosis   Bone metastases (HCC)   11/11/2020 - 01/18/2022 Chemotherapy   Patient is on Treatment Plan : BREAST Trastuzumab + Pertuzumab q21d     11/11/2020 - 04/12/2022 Chemotherapy   Patient is on Treatment Plan : BREAST Trastuzumab IV (8/6) or SQ (600) D1 q21d       CURRENT THERAPY: Herceptin  INTERVAL HISTORY:  Discussed the use of AI scribe software for clinical note transcription with the patient, who gave verbal consent to proceed.  Kim Archer 57 y.o. female returns for    Patient Active Problem List   Diagnosis Date Noted  . Dermatophytosis of nail 03/11/2022  . Port-A-Cath in place 06/05/2021  . Herpes simplex 04/28/2021  . Aortic atherosclerosis (HCC) 04/28/2021  . Malignant neoplasm metastatic to bone (HCC) 08/04/2020  . Bone lesion 07/08/2020  . Malignant neoplasm of lower-outer quadrant of right breast of female, estrogen receptor positive (HCC) 06/27/2020  . Goals of care, counseling/discussion 06/06/2019  . History of partial hysterectomy 03/28/2019  . 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  MEDICAL HISTORY: Past Medical History:  Diagnosis Date  . Allergy   . Anemia    due to heavy periods  . Atypical ductal hyperplasia of left breast   . History of kidney stones   . HSV-2 (herpes simplex virus 2) infection   . PONV (postoperative nausea and vomiting)   . right breast ca 06/2020   Breast cancer    SURGICAL HISTORY: Past Surgical History:  Procedure Laterality Date  . BREAST CYST EXCISION Left 03/22/2016   ALH-BENIGN  . BREAST LUMPECTOMY WITH RADIOACTIVE SEED LOCALIZATION Left 03/22/2016   Procedure: LEFT BREAST LUMPECTOMY WITH RADIOACTIVE SEED LOCALIZATION;  Surgeon: Chevis Pretty III, MD;  Location: [facility];  Service: General;  Laterality: Left;  . BREAST LUMPECTOMY WITH RADIOACTIVE SEED LOCALIZATION Right 01/02/2021   Procedure: RIGHT BREAST LUMPECTOMY WITH RADIOACTIVE SEED LOCALIZATION X2;  Surgeon: Griselda Miner, MD;  Location: [facility];  Service: General;  Laterality: Right;  . CESAREAN SECTION     x3  . COMBINED HYSTEROSCOPY DIAGNOSTIC / D&C  07/17/2018  . EYE SURGERY     lt lens replacement  . PORTACATH PLACEMENT Left 07/07/2020   Procedure: INSERTION PORT-A-CATH;  Surgeon: Griselda Miner, MD;  Location: [facility] OR;  Service: General;  Laterality: Left;  . TUBAL LIGATION      SOCIAL HISTORY: Social  History   Socioeconomic History  . Marital status: Married    Spouse name: Not on file  . Number of children: Not on file  . Years of education: Not on file  . Highest education level: Not on file  Occupational History  . Not on file  Tobacco Use  . Smoking status: Never  . Smokeless tobacco: Never  Vaping Use  . Vaping status: Never Used  Substance and Sexual Activity  . Alcohol use: No  . Drug use: No  . Sexual activity: Not on file    Comment: tubal ligation  Other Topics Concern   . Not on file  Social History Narrative  . Not on file   Social Drivers of Health   Financial Resource Strain: Low Risk  (04/15/2022)   Received from [facility], [facility]   Overall Financial Resource Strain (CARDIA)   . Difficulty of Paying Living Expenses: Not very hard  Food Insecurity: Low Risk  (06/14/2022)   [facility]   Hunger Vital Sign   . Worried About Programme researcher, broadcasting/film/video in the Last Year: Never true   . Ran Out of Food in the Last Year: Never true  Transportation Needs: No Transportation Needs (06/14/2022)   Received from [facility], [facility]   Transportation   . In the past 12 months, has lack of reliable transportation kept you from medical appointments, meetings, work or from getting things needed for daily living? : No  Physical Activity: Not on file  Stress: Not on file  Social Connections: Unknown (06/28/2021)   Received from [facility], [facility]   Social Network   . Social Network: Not on file  Intimate Partner Violence: Unknown (05/20/2021)   [facility] from [facility]   HITS   . Physically Hurt: Not on file   . Insult or Talk Down To: Not on file   . Threaten Physical Harm: Not on file   . Scream or Curse: Not on file    FAMILY HISTORY: Family History  Problem Relation Age of Onset  . Breast cancer Mother   . Melanoma Father   . Leukemia Sister   . Liver cancer Brother        stomach  . Breast cancer Maternal Aunt   . Colon cancer Neg Hx   . Colon polyps Neg Hx   . Esophageal cancer Neg Hx   . Rectal cancer Neg Hx   . Stomach cancer Neg Hx     Review of Systems - Oncology    PHYSICAL EXAMINATION    There were no vitals filed for this visit.  Physical Exam  LABORATORY DATA:  CBC    Component Value Date/Time   WBC 6.3 11/23/2022 1510   WBC 5.4 05/18/2021 0838   RBC 4.44 11/23/2022 1510   HGB 13.8 11/23/2022 1510   HGB 12.7 05/11/2016 1508   HCT 42.1 11/23/2022  1510   HCT 38.9 05/11/2016 1508   PLT 220 11/23/2022 1510   PLT 244 05/11/2016 1508   MCV 94.8 11/23/2022 1510   MCV 88.9 05/11/2016 1508   MCH 31.1 11/23/2022 1510   MCHC 32.8 11/23/2022 1510   RDW 12.3 11/23/2022 1510   RDW 16.8 (H) 05/11/2016 1508   LYMPHSABS 2.4 11/23/2022 1510   LYMPHSABS 2.7 05/11/2016 1508   MONOABS 0.4 11/23/2022 1510   MONOABS 0.5 05/11/2016 1508   EOSABS 0.2 11/23/2022 1510   EOSABS 0.2 05/11/2016 1508   BASOSABS 0.0  11/23/2022 1510   BASOSABS 0.0 05/11/2016 1508    CMP     Component Value Date/Time   NA 143 11/23/2022 1510   NA 142 05/11/2016 1508   K 4.1 11/23/2022 1510   K 4.3 05/11/2016 1508   CL 106 11/23/2022 1510   CO2 31 11/23/2022 1510   CO2 26 05/11/2016 1508   GLUCOSE 105 (H) 11/23/2022 1510   GLUCOSE 90 05/11/2016 1508   BUN 17 11/23/2022 1510   BUN 10.9 05/11/2016 1508   CREATININE 0.99 11/23/2022 1510   CREATININE 1.0 05/11/2016 1508   CALCIUM 10.3 11/23/2022 1510   CALCIUM 9.5 05/11/2016 1508   PROT 7.5 11/23/2022 1510   PROT 7.0 05/11/2016 1508   ALBUMIN 4.6 11/23/2022 1510   ALBUMIN 3.9 05/11/2016 1508   AST 19 11/23/2022 1510   AST 19 05/11/2016 1508   ALT 20 11/23/2022 1510   ALT 23 05/11/2016 1508   ALKPHOS 95 11/23/2022 1510   ALKPHOS 98 05/11/2016 1508   BILITOT 0.4 11/23/2022 1510   BILITOT 0.36 05/11/2016 1508   GFRNONAA >60 11/23/2022 1510       PENDING LABS:   RADIOGRAPHIC STUDIES:  No results found.   PATHOLOGY:     ASSESSMENT and THERAPY PLAN:   No problem-specific Assessment & Plan notes found for this encounter.   No orders of the defined types were placed in this encounter.   All questions were answered. The patient knows to call the clinic with any problems, questions or concerns. We can certainly see the patient much sooner if necessary. This note was electronically signed. Noreene Filbert, NP 02/14/2023

## 2023-02-15 ENCOUNTER — Inpatient Hospital Stay: Payer: Commercial Managed Care - PPO

## 2023-02-17 ENCOUNTER — Encounter: Payer: Self-pay | Admitting: Hematology and Oncology

## 2023-02-17 NOTE — Assessment & Plan Note (Signed)
 Influenza Recent exposure and onset of symptoms consistent with influenza. Currently outside the window for Tamiflu treatment and patient is starting to feel better. -Advise rest and hydration. -Reschedule Herceptin  treatment by one week to avoid potential exposure to others and to allow for full recovery.  Likely urinary Tract Infection Reports dysuria and hesitancy -Prescribe Macrobid , one tablet twice a day for seven days. -If symptoms persist, patient to notify office for possible urine culture.  Herceptin  Treatment for Breast Cancer Treatment scheduled for tomorrow, but will be delayed due to recent influenza symptoms. -Reschedule all future Herceptin  treatments by one week to maintain four-week interval. -Continue to monitor for any worsening symptoms or side effects.

## 2023-02-18 ENCOUNTER — Other Ambulatory Visit: Payer: Self-pay

## 2023-02-22 ENCOUNTER — Inpatient Hospital Stay: Payer: Commercial Managed Care - PPO

## 2023-02-22 ENCOUNTER — Encounter: Payer: Self-pay | Admitting: Adult Health

## 2023-02-22 ENCOUNTER — Inpatient Hospital Stay: Payer: Commercial Managed Care - PPO | Attending: Hematology and Oncology | Admitting: Adult Health

## 2023-02-22 VITALS — BP 106/60 | HR 67 | Temp 97.2°F | Resp 16 | Wt 177.5 lb

## 2023-02-22 DIAGNOSIS — Z5112 Encounter for antineoplastic immunotherapy: Secondary | ICD-10-CM | POA: Diagnosis present

## 2023-02-22 DIAGNOSIS — C50511 Malignant neoplasm of lower-outer quadrant of right female breast: Secondary | ICD-10-CM

## 2023-02-22 DIAGNOSIS — Z79811 Long term (current) use of aromatase inhibitors: Secondary | ICD-10-CM | POA: Insufficient documentation

## 2023-02-22 DIAGNOSIS — Z1731 Human epidermal growth factor receptor 2 positive status: Secondary | ICD-10-CM | POA: Insufficient documentation

## 2023-02-22 DIAGNOSIS — Z09 Encounter for follow-up examination after completed treatment for conditions other than malignant neoplasm: Secondary | ICD-10-CM

## 2023-02-22 DIAGNOSIS — C7951 Secondary malignant neoplasm of bone: Secondary | ICD-10-CM | POA: Diagnosis not present

## 2023-02-22 DIAGNOSIS — Z170 Estrogen receptor positive status [ER+]: Secondary | ICD-10-CM | POA: Diagnosis not present

## 2023-02-22 MED ORDER — SODIUM CHLORIDE 0.9 % IV SOLN
6.0000 mg/kg | Freq: Once | INTRAVENOUS | Status: AC
  Administered 2023-02-22: 504 mg via INTRAVENOUS
  Filled 2023-02-22: qty 24

## 2023-02-22 MED ORDER — SODIUM CHLORIDE 0.9 % IV SOLN: Freq: Once | INTRAVENOUS | Status: AC

## 2023-02-22 MED ORDER — ACETAMINOPHEN 325 MG PO TABS
650.0000 mg | ORAL_TABLET | Freq: Once | ORAL | Status: DC
  Filled 2023-02-22: qty 2

## 2023-02-22 MED ORDER — DIPHENHYDRAMINE HCL 25 MG PO CAPS
50.0000 mg | ORAL_CAPSULE | Freq: Once | ORAL | Status: DC
  Filled 2023-02-22: qty 2

## 2023-02-22 NOTE — Assessment & Plan Note (Signed)
 Breast Cancer with Bone Metastasis Stable on Herceptin . Recent echo showed EF of 50-55%. -Continue Herceptin  every 4 weeks. -Next PET scan scheduled for April 22, 2023. -Next echo scheduled for February, 2025.  Upper Respiratory Symptoms Recent flu with persistent nasal congestion and postnasal drip. Improved with Z-Pak. -Continue current management. -Contact office if symptoms worsen or new symptoms develop.  RTC in 4 weeks for f/u and treatment.

## 2023-02-22 NOTE — Progress Notes (Signed)
 [facility] Cancer Follow up:    Kim Archer [address] [place] [address]   DIAGNOSIS:  Cancer Staging  Malignant neoplasm of lower-outer quadrant of right breast of female, estrogen receptor positive (HCC) Staging form: Breast, AJCC 8th Edition - Clinical stage from 07/02/2020: Stage IV (cT2, cN1, cM1, G3, ER+, PR+, HER2+) - Signed by Layla Sandria BROCKS, MD on 08/04/2020 Stage prefix: Initial diagnosis Histologic grading system: 3 grade system Laterality: Right Staged by: Pathologist and managing physician Stage used in treatment planning: Yes National guidelines used in treatment planning: Yes Type of national guideline used in treatment planning: NCCN   SUMMARY OF ONCOLOGIC HISTORY: Oncology History  Malignant neoplasm of lower-outer quadrant of right breast of female, estrogen receptor positive (HCC)  03/22/2016 Surgery   left lumpectomy 03/22/2016 showing atypical lobular hyperplasia prophylactic tamoxifen  taken from 04/2016 through 03/2019   06/06/2019 Genetic Testing   Negative genetic testing:  No pathogenic variants detected on the [facility] panel, ordered by Dr. Mat at [facility]. Two variants of uncertain significance (VUS) were detected - one in the BRIP1 gene called c.2863A>C and a second in the NBN gene called c.643C>T. The report date is 06/06/2019.  The [facility] gene panel offered by [employer] includes sequencing and deletion/duplication testing of the following 35 genes: APC, ATM, AXIN2, BARD1, BMPR1A, BRCA1, BRCA2, BRIP1, CDH1, CDK4, CDKN2A, CHEK2, EPCAM (large rearrangement only), HOXB13 (sequencing only), GALNT12, MLH1, MSH2, MSH3 (excluding repetitive portions of exon 1), MSH6, MUTYH, NBN, NTHL1, PALB2, PMS2, PTEN, RAD51C, RAD51D, RNF43, RPS20, SMAD4, STK11, and TP53. Sequencing was performed for select regions of POLE and POLD1, and large rearrangement analysis was performed for select  regions of GREM1.     06/25/2020 Relapse/Recurrence   right breast lower outer quadrant biopsy 06/25/2020 shows a clinical T2 N1, stage IB invasive ductal carcinoma, grade 2 or 3, triple positive, with an MIB-1 of 40%   07/02/2020 Cancer Staging   Staging form: Breast, AJCC 8th Edition - Clinical stage from 07/02/2020: Stage IV (cT2, cN1, cM1, G3, ER+, PR+, HER2+) - Signed by Layla Sandria BROCKS, MD on 08/04/2020 Stage prefix: Initial diagnosis Histologic grading system: 3 grade system Laterality: Right Staged by: Pathologist and managing physician Stage used in treatment planning: Yes National guidelines used in treatment planning: Yes Type of national guideline used in treatment planning: NCCN   07/16/2020 - 09/30/2020 Chemotherapy   neoadjuvant chemotherapy consisting of docetaxel , carboplatin , trastuzumab  and pertuzumab  every 21 days x 4 cycles started 07/28/2020, last chemotherapy 09/30/2020       08/01/2020 PET scan   PET scan 08/01/2020 shows no hypermetabolism in the 1.2 cm right liver lesion; bony hypermetabolism consistent with known mets   08/19/2020 - 10/21/2020 Chemotherapy   Patient is on Treatment Plan : BREAST  Trastuzumab  + Pertuzumab  q21d      10/07/2020 Imaging    total spinal MRI 10/07/2020 shows stable to improved disease, no extraosseous extension, pathologic fracture, epidural or intracanalicular involvement MRI of the breast 10/19/2020 shows a complete imaging response in the breast, with new ring-enhancing lesions in the left breast with MRI guided biopsy 11/10/2020 showing no evidence of malignancy   11/11/2020 - 01/18/2022 Chemotherapy   Patient is on Treatment Plan : BREAST Trastuzumab  + Pertuzumab  q21d     11/11/2020 - 04/12/2022 Chemotherapy   Patient is on Treatment Plan : BREAST Trastuzumab  IV (8/6) or SQ (600) D1 q21d     01/02/2021 Surgery  right lumpectomy with no sentinel lymph node sampling 01/02/2021 showed scattered foci of residual invasive ductal  carcinoma, grade 2, the largest measuring 0.8 cm.  Margins were negative   01/02/2021 -  Anti-estrogen oral therapy   anastrozole  started 01/12/2021   05/11/2022 -  Chemotherapy   Patient is on Treatment Plan : BREAST MAINTENANCE Trastuzumab  IV (6) or SQ (600) D1 q28d x 13 cycles     Malignant neoplasm metastatic to bone (HCC)  08/04/2020 Initial Diagnosis   Bone metastases (HCC)   11/11/2020 - 01/18/2022 Chemotherapy   Patient is on Treatment Plan : BREAST Trastuzumab  + Pertuzumab  q21d     11/11/2020 - 04/12/2022 Chemotherapy   Patient is on Treatment Plan : BREAST Trastuzumab  IV (8/6) or SQ (600) D1 q21d       CURRENT THERAPY: Herceptin   INTERVAL HISTORY:  Discussed the use of AI scribe software for clinical note transcription with the patient, who gave verbal consent to proceed.  Kim Archer 58 y.o. female  patient, with a history of breast cancer metastatic to the bone, is currently receiving Herceptin . Her most recent echocardiogram on November 22nd showed an ejection fraction of 50-55%. She has a PET scan ordered by Dr. Gudena to be completed around March 7th and receives Zometa  every six months, most recently on November 23, 2022.  Over the holidays, the patient experienced flu-like symptoms, including constant nasal drip and congestion. She was prescribed a Z-Pak, which improved her symptoms within a few hours of the first dose. She also reports a constant feeling of her nose being blocked, which she manages with ibuprofen.    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  MEDICAL HISTORY: Past Medical History:  Diagnosis Date   Allergy    Anemia    due to heavy periods   Atypical ductal hyperplasia of left breast    History of kidney stones    HSV-2 (herpes simplex virus 2) infection    PONV (postoperative nausea and vomiting)    right breast ca 06/2020   Breast cancer    SURGICAL HISTORY: Past Surgical History:  Procedure Laterality Date   BREAST CYST EXCISION Left 03/22/2016   ALH-BENIGN   BREAST LUMPECTOMY WITH RADIOACTIVE SEED LOCALIZATION Left 03/22/2016   Procedure: LEFT BREAST LUMPECTOMY WITH RADIOACTIVE SEED LOCALIZATION;  Surgeon: Deward Null III, MD;  Location: [facility];  Service: General;  Laterality: Left;   BREAST LUMPECTOMY WITH RADIOACTIVE SEED LOCALIZATION Right 01/02/2021   Procedure: RIGHT BREAST LUMPECTOMY WITH RADIOACTIVE SEED LOCALIZATION X2;  Surgeon: Null Deward MOULD, MD;  Location: [facility];  Service: General;  Laterality: Right;   CESAREAN SECTION     x3   COMBINED HYSTEROSCOPY DIAGNOSTIC / D&C  07/17/2018   EYE SURGERY     lt lens replacement   PORTACATH PLACEMENT Left 07/07/2020   Procedure: INSERTION PORT-A-CATH;  Surgeon: Null Deward MOULD, MD;  Location[facility];  Service: General;  Laterality:  Left;   TUBAL LIGATION      SOCIAL HISTORY: Social History   Socioeconomic History   Marital status: Married    Spouse name: Not on file   Number of children: Not on file   Years of education: Not on file   Highest education level: Not on file  Occupational History   Not on file  Tobacco Use   Smoking status: Never   Smokeless tobacco: Never  Vaping Use   Vaping status: Never Used  Substance and Sexual Activity   Alcohol use: No   Drug use: No   Sexual activity: Not on file    Comment: tubal  ligation  Other Topics Concern   Not on file  Social History Narrative   Not on file   Social Drivers of Health   Financial Resource Strain: Low Risk  (04/15/2022)   [facility]   Overall Financial Resource Strain (CARDIA)    Difficulty of Paying Living Expenses: Not very hard  Food Insecurity: Low Risk  (06/14/2022)   [facility]   Hunger Vital Sign    Worried About Running Out of Food in the Last Year: Never true    Ran Out of Food in the Last Year: Never true  Transportation Needs: No Transportation Needs (06/14/2022)   [facility]   Transportation    In the past 12 months, has lack of reliable transportation kept you from medical appointments, meetings, work or from getting things needed for daily living? : No  Physical Activity: Not on file  Stress: Not on file  Social Connections: Unknown (06/28/2021)   [facility]   Social Network    Social Network: Not on file  Intimate Partner Violence: Unknown (05/20/2021)   Received from [facility]   HITS    Physically Hurt: Not on file    Insult or Talk Down To: Not on file    Threaten Physical Harm: Not on file    Scream or Curse: Not on file    FAMILY HISTORY: Family History  Problem Relation Age of Onset   Breast cancer Mother    Melanoma Father    Leukemia Sister    Liver cancer Brother        stomach   Breast cancer Maternal Aunt    Colon cancer Neg Hx    Colon polyps Neg Hx    Esophageal cancer Neg Hx    Rectal cancer Neg Hx    Stomach cancer Neg Hx     Review of Systems  Constitutional:  Negative for appetite change, chills, fatigue, fever and unexpected weight change.  HENT:   Negative for hearing loss, lump/mass and trouble swallowing.   Eyes:  Negative for eye problems and icterus.  Respiratory:  Negative for chest tightness, cough and shortness of breath.    Cardiovascular:  Negative for chest pain, leg swelling and palpitations.  Gastrointestinal:  Negative for abdominal distention, abdominal pain, constipation, diarrhea, nausea and vomiting.  Endocrine: Negative for hot flashes.  Genitourinary:  Negative for difficulty urinating.   Musculoskeletal:  Negative for arthralgias.  Skin:  Negative for itching and rash.  Neurological:  Negative for dizziness, extremity weakness, headaches and numbness.  Hematological:  Negative for adenopathy. Does not bruise/bleed easily.  Psychiatric/Behavioral:  Negative for depression. The patient is not nervous/anxious.       PHYSICAL EXAMINATION    Vitals:   02/22/23 0941  BP: 106/60  Pulse: 67  Resp: 16  Temp: (!) 97.2 F (36.2 C)  SpO2: 97%    Physical Exam Constitutional:      General: She is not in acute distress.    Appearance: Normal appearance. She is not toxic-appearing.  HENT:     Head: Normocephalic and atraumatic.     Mouth/Throat:     Mouth: Mucous membranes are moist.     Pharynx: Oropharynx is clear. No oropharyngeal exudate or posterior oropharyngeal erythema.  Eyes:     General: No scleral icterus. Cardiovascular:     Rate and Rhythm: Normal rate and regular rhythm.     Pulses: Normal pulses.     Heart sounds: Normal heart sounds.  Pulmonary:     Effort: Pulmonary effort is normal.     Breath sounds: Normal breath sounds.  Abdominal:     General: Abdomen is flat. Bowel sounds are normal. There is no distension.     Palpations: Abdomen is soft.     Tenderness: There is no abdominal tenderness.  Musculoskeletal:        General: No swelling.     Cervical back: Neck supple.  Lymphadenopathy:     Cervical: No cervical adenopathy.  Skin:    General: Skin is warm and dry.     Findings: No rash.  Neurological:     General: No focal deficit present.     Mental Status: She is alert.  Psychiatric:        Mood and Affect: Mood normal.        Behavior: Behavior normal.      LABORATORY DATA:  CBC    Component Value Date/Time   WBC 6.3 11/23/2022 1510   WBC 5.4 05/18/2021 0838   RBC 4.44 11/23/2022 1510   HGB 13.8 11/23/2022 1510   HGB 12.7 05/11/2016 1508   HCT 42.1 11/23/2022 1510   HCT 38.9 05/11/2016 1508   PLT 220 11/23/2022 1510   PLT 244 05/11/2016 1508   MCV 94.8 11/23/2022 1510   MCV 88.9 05/11/2016 1508   MCH 31.1 11/23/2022 1510   MCHC 32.8 11/23/2022 1510   RDW 12.3 11/23/2022 1510   RDW 16.8 (H) 05/11/2016 1508   LYMPHSABS 2.4 11/23/2022 1510   LYMPHSABS 2.7 05/11/2016 1508   MONOABS 0.4 11/23/2022 1510   MONOABS 0.5 05/11/2016 1508   EOSABS 0.2 11/23/2022 1510   EOSABS 0.2 05/11/2016 1508   BASOSABS 0.0 11/23/2022 1510   BASOSABS 0.0 05/11/2016 1508    CMP     Component Value Date/Time   NA 143 11/23/2022 1510   NA 142 05/11/2016 1508   K 4.1 11/23/2022 1510   K 4.3 05/11/2016 1508   CL 106 11/23/2022 1510   CO2 31 11/23/2022 1510   CO2 26 05/11/2016 1508   GLUCOSE 105 (H) 11/23/2022 1510   GLUCOSE 90 05/11/2016 1508   BUN 17 11/23/2022 1510   BUN 10.9 05/11/2016 1508   CREATININE 0.99 11/23/2022 1510   CREATININE 1.0 05/11/2016 1508   CALCIUM 10.3 11/23/2022 1510   CALCIUM 9.5 05/11/2016 1508   PROT 7.5 11/23/2022 1510   PROT 7.0 05/11/2016 1508   ALBUMIN 4.6 11/23/2022 1510   ALBUMIN 3.9 05/11/2016 1508   AST 19 11/23/2022 1510   AST 19 05/11/2016 1508   ALT 20 11/23/2022 1510   ALT 23 05/11/2016 1508   ALKPHOS 95 11/23/2022 1510   ALKPHOS 98 05/11/2016 1508   BILITOT 0.4 11/23/2022 1510   BILITOT 0.36 05/11/2016 1508  GFRNONAA >60 11/23/2022 1510      ASSESSMENT and THERAPY PLAN:   Malignant neoplasm of lower-outer quadrant of right breast of female, estrogen receptor positive (HCC) Breast Cancer with Bone Metastasis Stable on Herceptin . Recent echo showed EF of 50-55%. -Continue Herceptin  every 4 weeks. -Next PET scan scheduled for April 22, 2023. -Next echo scheduled for February,  2025.  Upper Respiratory Symptoms Recent flu with persistent nasal congestion and postnasal drip. Improved with Z-Pak. -Continue current management. -Contact office if symptoms worsen or new symptoms develop.  RTC in 4 weeks for f/u and treatment.    All questions were answered. The patient knows to call the clinic with any problems, questions or concerns. We can certainly see the patient much sooner if necessary.  Total encounter time:20 minutes*in face-to-face visit time, chart review, lab review, care coordination, order entry, and documentation of the encounter time.  Morna Kendall, NP 02/22/23 3:52 PM Medical Oncology and Hematology [facility] [address] [place], [place] [address] Tel. [phone]    Fax. [phone]  *Total Encounter Time as defined by the Centers for Medicare and Medicaid Services includes, in addition to the face-to-face time of a patient visit (documented in the note above) non-face-to-face time: obtaining and reviewing outside history, ordering and reviewing medications, tests or procedures, care coordination (communications with other health care professionals or caregivers) and documentation in the medical record.

## 2023-02-22 NOTE — Patient Instructions (Signed)
 [facility] - A DEPT OF [facility]  Discharge Instructions: Thank you for choosing [facility] to provide your oncology and hematology care.   If you have a lab appointment with the Cancer Center, please go directly to the Cancer Center and check in at the registration area.   Wear comfortable clothing and clothing appropriate for easy access to any Portacath or PICC line.   We strive to give you quality time with your provider. You may need to reschedule your appointment if you arrive late (15 or more minutes).  Arriving late affects you and other patients whose appointments are after yours.  Also, if you miss three or more appointments without notifying the office, you may be dismissed from the clinic at the provider's discretion.      For prescription refill requests, have your pharmacy contact our office and allow 72 hours for refills to be completed.    Today you received the following chemotherapy and/or immunotherapy agents: Kanjinti.       To help prevent nausea and vomiting after your treatment, we encourage you to take your nausea medication as directed.  BELOW ARE SYMPTOMS THAT SHOULD BE REPORTED IMMEDIATELY: *FEVER GREATER THAN 100.4 F (38 C) OR HIGHER *CHILLS OR SWEATING *NAUSEA AND VOMITING THAT IS NOT CONTROLLED WITH YOUR NAUSEA MEDICATION *UNUSUAL SHORTNESS OF BREATH *UNUSUAL BRUISING OR BLEEDING *URINARY PROBLEMS (pain or burning when urinating, or frequent urination) *BOWEL PROBLEMS (unusual diarrhea, constipation, pain near the anus) TENDERNESS IN MOUTH AND THROAT WITH OR WITHOUT PRESENCE OF ULCERS (sore throat, sores in mouth, or a toothache) UNUSUAL RASH, SWELLING OR PAIN  UNUSUAL VAGINAL DISCHARGE OR ITCHING   Items with * indicate a potential emergency and should be followed up as soon as possible or go to the Emergency Department if any problems should occur.  Please show the CHEMOTHERAPY ALERT CARD or IMMUNOTHERAPY  ALERT CARD at check-in to the Emergency Department and triage nurse.  Should you have questions after your visit or need to cancel or reschedule your appointment, please contact [facility] - A DEPT OF [facility]  Dept: [phone]  and follow the prompts.  Office hours are 8:00 a.m. to 4:30 p.m. Monday - Friday. Please note that voicemails left after 4:00 p.m. may not be returned until the following business day.  We are closed weekends and major holidays. You have access to a nurse at all times for urgent questions. Please call the main number to the clinic Dept: [phone] and follow the prompts.   For any non-urgent questions, you may also contact your provider using MyChart. We now offer e-Visits for anyone 70 and older to request care online for non-urgent symptoms. For details visit [email or web address].   Also download the MyChart app! Go to the app store, search "MyChart", open the app, select [facility], and log in with your MyChart username and password.  Zoledronic Acid Injection (Cancer) What is this medication? ZOLEDRONIC ACID (ZOE le dron ik AS id) treats high calcium levels in the blood caused by cancer. It may also be used with chemotherapy to treat weakened bones caused by cancer. It works by slowing down the release of calcium from bones. This lowers calcium levels in your blood. It also makes your bones stronger and less likely to break (fracture). It belongs to a group of medications called bisphosphonates. This medicine may be used for other purposes; ask your health care  provider or pharmacist if you have questions. COMMON BRAND NAME(S): Zometa, Zometa Powder What should I tell my care team before I take this medication? They need to know if you have any of these conditions: Dehydration Dental disease Kidney disease Liver disease Low levels of calcium in the blood Lung or breathing disease, such as asthma Receiving steroids, such as  dexamethasone or prednisone An unusual or allergic reaction to zoledronic acid, other medications, foods, dyes, or preservatives Pregnant or trying to get pregnant Breast-feeding How should I use this medication? This medication is injected into a vein. It is given by your care team in a hospital or clinic setting. Talk to your care team about the use of this medication in children. Special care may be needed. Overdosage: If you think you have taken too much of this medicine contact a poison control center or emergency room at once. NOTE: This medicine is only for you. Do not share this medicine with others. What if I miss a dose? Keep appointments for follow-up doses. It is important not to miss your dose. Call your care team if you are unable to keep an appointment. What may interact with this medication? Certain antibiotics given by injection Diuretics, such as bumetanide, furosemide NSAIDs, medications for pain and inflammation, such as ibuprofen or naproxen Teriparatide Thalidomide This list may not describe all possible interactions. Give your health care provider a list of all the medicines, herbs, non-prescription drugs, or dietary supplements you use. Also tell them if you smoke, drink alcohol, or use illegal drugs. Some items may interact with your medicine. What should I watch for while using this medication? Visit your care team for regular checks on your progress. It may be some time before you see the benefit from this medication. Some people who take this medication have severe bone, joint, or muscle pain. This medication may also increase your risk for jaw problems or a broken thigh bone. Tell your care team right away if you have severe pain in your jaw, bones, joints, or muscles. Tell you care team if you have any pain that does not go away or that gets worse. Tell your dentist and dental surgeon that you are taking this medication. You should not have major dental surgery  while on this medication. See your dentist to have a dental exam and fix any dental problems before starting this medication. Take good care of your teeth while on this medication. Make sure you see your dentist for regular follow-up appointments. You should make sure you get enough calcium and vitamin D while you are taking this medication. Discuss the foods you eat and the vitamins you take with your care team. Check with your care team if you have severe diarrhea, nausea, and vomiting, or if you sweat a lot. The loss of too much body fluid may make it dangerous for you to take this medication. You may need bloodwork while taking this medication. Talk to your care team if you wish to become pregnant or think you might be pregnant. This medication can cause serious birth defects. What side effects may I notice from receiving this medication? Side effects that you should report to your care team as soon as possible: Allergic reactions--skin rash, itching, hives, swelling of the face, lips, tongue, or throat Kidney injury--decrease in the amount of urine, swelling of the ankles, hands, or feet Low calcium level--muscle pain or cramps, confusion, tingling, or numbness in the hands or feet Osteonecrosis of the jaw--pain, swelling, or  redness in the mouth, numbness of the jaw, poor healing after dental work, unusual discharge from the mouth, visible bones in the mouth Severe bone, joint, or muscle pain Side effects that usually do not require medical attention (report to your care team if they continue or are bothersome): Constipation Fatigue Fever Loss of appetite Nausea Stomach pain This list may not describe all possible side effects. Call your doctor for medical advice about side effects. You may report side effects to FDA at 1-800-FDA-1088. Where should I keep my medication? This medication is given in a hospital or clinic. It will not be stored at home. NOTE: This sheet is a summary. It may  not cover all possible information. If you have questions about this medicine, talk to your doctor, pharmacist, or health care provider.  2024 Elsevier/Gold Standard (2021-03-27 00:00:00)

## 2023-02-24 ENCOUNTER — Other Ambulatory Visit: Payer: Self-pay

## 2023-03-03 ENCOUNTER — Encounter: Payer: Self-pay | Admitting: *Deleted

## 2023-03-03 NOTE — Progress Notes (Signed)
Received fax from [facility] stating they have attempt to contact pt several times for an appt with no response.

## 2023-03-08 ENCOUNTER — Other Ambulatory Visit: Payer: Self-pay

## 2023-03-15 ENCOUNTER — Ambulatory Visit: Payer: Commercial Managed Care - PPO | Admitting: Adult Health

## 2023-03-15 ENCOUNTER — Ambulatory Visit: Payer: Commercial Managed Care - PPO

## 2023-03-21 NOTE — Assessment & Plan Note (Signed)
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  Joint pains and stiffness:on letrozole 2. hot flashes 3. Diarrhea from Perjeta: Resolved since we stopped Perjeta   Brain: 06/08/2022: Negative Continue with Herceptin maintenance monthly   Zometa to every 6 months.   Patient has tried three different medications (Anastrozole, Letrozole, and Exemestane) with side effects including weight gain and joint pain. Tamoxifen and Faslodex were discussed as future options. -Continue Herceptin indefinitely -Consider Tamoxifen at a low dose starting in February   Breast cancer surveillance:  Mammogram 12/08/2022: Benign breast density category C

## 2023-03-22 ENCOUNTER — Inpatient Hospital Stay: Payer: Commercial Managed Care - PPO

## 2023-03-22 ENCOUNTER — Ambulatory Visit: Payer: Commercial Managed Care - PPO | Admitting: Hematology and Oncology

## 2023-03-22 ENCOUNTER — Inpatient Hospital Stay: Payer: Commercial Managed Care - PPO | Attending: Hematology and Oncology | Admitting: Hematology and Oncology

## 2023-03-22 ENCOUNTER — Ambulatory Visit: Payer: Commercial Managed Care - PPO

## 2023-03-22 VITALS — BP 130/61 | HR 70 | Temp 98.1°F | Resp 18 | Ht 67.0 in | Wt 175.1 lb

## 2023-03-22 DIAGNOSIS — Z170 Estrogen receptor positive status [ER+]: Secondary | ICD-10-CM

## 2023-03-22 DIAGNOSIS — C50511 Malignant neoplasm of lower-outer quadrant of right female breast: Secondary | ICD-10-CM | POA: Diagnosis not present

## 2023-03-22 DIAGNOSIS — C7951 Secondary malignant neoplasm of bone: Secondary | ICD-10-CM | POA: Diagnosis not present

## 2023-03-22 DIAGNOSIS — Z79620 Long term (current) use of immunosuppressive biologic: Secondary | ICD-10-CM | POA: Diagnosis not present

## 2023-03-22 DIAGNOSIS — Z5112 Encounter for antineoplastic immunotherapy: Secondary | ICD-10-CM | POA: Insufficient documentation

## 2023-03-22 MED ORDER — SODIUM CHLORIDE 0.9% FLUSH
10.0000 mL | INTRAVENOUS | Status: DC | PRN
  Administered 2023-03-22: 10 mL

## 2023-03-22 MED ORDER — HEPARIN SOD (PORK) LOCK FLUSH 100 UNIT/ML IV SOLN
500.0000 [IU] | Freq: Once | INTRAVENOUS | Status: AC | PRN
  Administered 2023-03-22: 500 [IU]

## 2023-03-22 MED ORDER — TRASTUZUMAB-ANNS CHEMO 150 MG IV SOLR
6.0000 mg/kg | Freq: Once | INTRAVENOUS | Status: AC
  Administered 2023-03-22: 504 mg via INTRAVENOUS
  Filled 2023-03-22: qty 24

## 2023-03-22 MED ORDER — SODIUM CHLORIDE 0.9 % IV SOLN: Freq: Once | INTRAVENOUS | Status: AC

## 2023-03-22 MED ORDER — ACETAMINOPHEN 325 MG PO TABS
650.0000 mg | ORAL_TABLET | Freq: Once | ORAL | Status: AC
  Administered 2023-03-22: 650 mg via ORAL

## 2023-03-22 NOTE — Patient Instructions (Signed)

## 2023-03-22 NOTE — Progress Notes (Signed)
 Patient Care Team: [name] as PCP - General (Physician Assistant) Mat Browning, MD as Consulting Physician (Obstetrics and Gynecology) Curvin Deward MOULD, MD as Consulting Physician (General Surgery) Shannon Agent, MD as Consulting Physician (Radiation Oncology) Rolan Ezra RAMAN, MD as Consulting Physician (Cardiology) Odean Potts, MD as Medical Oncologist (Hematology and Oncology)  DIAGNOSIS:  Encounter Diagnosis  Name Primary?   Malignant neoplasm of lower-outer quadrant of right breast of female, estrogen receptor positive (HCC) Yes    SUMMARY OF ONCOLOGIC HISTORY: Oncology History  Malignant neoplasm of lower-outer quadrant of right breast of female, estrogen receptor positive (HCC)  03/22/2016 Surgery   left lumpectomy 03/22/2016 showing atypical lobular hyperplasia prophylactic tamoxifen  taken from 04/2016 through 03/2019   06/06/2019 Genetic Testing   Negative genetic testing:  No pathogenic variants detected on the [facility] panel, ordered by Dr. Mat at [facility]. Two variants of uncertain significance (VUS) were detected - one in the BRIP1 gene called c.2863A>C and a second in the NBN gene called c.643C>T. The report date is 06/06/2019.  The [facility] gene panel offered by [employer] includes sequencing and deletion/duplication testing of the following 35 genes: APC, ATM, AXIN2, BARD1, BMPR1A, BRCA1, BRCA2, BRIP1, CDH1, CDK4, CDKN2A, CHEK2, EPCAM (large rearrangement only), HOXB13 (sequencing only), GALNT12, MLH1, MSH2, MSH3 (excluding repetitive portions of exon 1), MSH6, MUTYH, NBN, NTHL1, PALB2, PMS2, PTEN, RAD51C, RAD51D, RNF43, RPS20, SMAD4, STK11, and TP53. Sequencing was performed for select regions of POLE and POLD1, and large rearrangement analysis was performed for select regions of GREM1.     06/25/2020 Relapse/Recurrence   right breast lower outer quadrant biopsy 06/25/2020 shows a clinical T2 N1, stage IB  invasive ductal carcinoma, grade 2 or 3, triple positive, with an MIB-1 of 40%   07/02/2020 Cancer Staging   Staging form: Breast, AJCC 8th Edition - Clinical stage from 07/02/2020: Stage IV (cT2, cN1, cM1, G3, ER+, PR+, HER2+) - Signed by Layla Sandria BROCKS, MD on 08/04/2020 Stage prefix: Initial diagnosis Histologic grading system: 3 grade system Laterality: Right Staged by: Pathologist and managing physician Stage used in treatment planning: Yes National guidelines used in treatment planning: Yes Type of national guideline used in treatment planning: NCCN   07/16/2020 - 09/30/2020 Chemotherapy   neoadjuvant chemotherapy consisting of docetaxel , carboplatin , trastuzumab  and pertuzumab  every 21 days x 4 cycles started 07/28/2020, last chemotherapy 09/30/2020       08/01/2020 PET scan   PET scan 08/01/2020 shows no hypermetabolism in the 1.2 cm right liver lesion; bony hypermetabolism consistent with known mets   08/19/2020 - 10/21/2020 Chemotherapy   Patient is on Treatment Plan : BREAST  Trastuzumab  + Pertuzumab  q21d      10/07/2020 Imaging    total spinal MRI 10/07/2020 shows stable to improved disease, no extraosseous extension, pathologic fracture, epidural or intracanalicular involvement MRI of the breast 10/19/2020 shows a complete imaging response in the breast, with new ring-enhancing lesions in the left breast with MRI guided biopsy 11/10/2020 showing no evidence of malignancy   11/11/2020 - 01/18/2022 Chemotherapy   Patient is on Treatment Plan : BREAST Trastuzumab  + Pertuzumab  q21d     11/11/2020 - 04/12/2022 Chemotherapy   Patient is on Treatment Plan : BREAST Trastuzumab  IV (8/6) or SQ (600) D1 q21d     01/02/2021 Surgery   right lumpectomy with no sentinel lymph node sampling 01/02/2021 showed scattered foci of residual invasive ductal carcinoma, grade 2, the largest measuring 0.8 cm.  Margins were negative  01/02/2021 -  Anti-estrogen oral therapy   anastrozole  started  01/12/2021   05/11/2022 -  Chemotherapy   Patient is on Treatment Plan : BREAST MAINTENANCE Trastuzumab  IV (6) or SQ (600) D1 q28d x 13 cycles     Malignant neoplasm metastatic to bone (HCC)  08/04/2020 Initial Diagnosis   Bone metastases (HCC)   11/11/2020 - 01/18/2022 Chemotherapy   Patient is on Treatment Plan : BREAST Trastuzumab  + Pertuzumab  q21d     11/11/2020 - 04/12/2022 Chemotherapy   Patient is on Treatment Plan : BREAST Trastuzumab  IV (8/6) or SQ (600) D1 q21d       CHIEF COMPLIANT:   HISTORY OF PRESENT ILLNESS: Discussed the use of AI scribe software for clinical note transcription with the patient, who gave verbal consent to proceed.  History of Present Illness   Kim Archer is a 58 year old female with a history of spinal cancer who presents with back pain due to a pinched nerve.  She has a history of cancer in her spine, which makes her cautious about seeking chiropractic treatment. Approximately eight to ten years ago, she received an injection in her spine, which provided temporary relief. The procedure was performed by Dr. Cesario at [facility].  She is experiencing a pinched nerve in her back, which began approximately one month ago. The pain is localized to the side of her back and is exacerbated when she lies on her side. Initially, the pain was manageable, but it has progressively worsened over the past month. She describes difficulty in moving upon waking, taking about five minutes to get from her bed to the bathroom in the morning. The pain tends to improve as the day progresses unless she reclines for extended periods, such as on weekends.  She has experienced a weight loss of about ten pounds since October, going from 184 pounds to 175 pounds.         ALLERGIES:  is allergic to ciprofloxacin, ciprofibrate, gabapentin , sulfa antibiotics, and venlafaxine .  MEDICATIONS:  Current Outpatient Medications  Medication Sig Dispense Refill   Biotin  5 MG  CAPS Take 1 capsule (5 mg total) by mouth daily.  0   Boron 3 MG CAPS Take by mouth.     Calcium 200 MG TABS      Calcium 500 MG tablet Take 600 mg by mouth.     Cholecalciferol (VITAMIN D) 10 MCG/ML LIQD      ipratropium (ATROVENT ) 0.06 % nasal spray Place 1 spray into both nostrils 3 (three) times daily. 15 mL 3   lidocaine -prilocaine  (EMLA ) cream Apply topically continuous as needed. 30 g 1   loratadine (CLARITIN) 10 MG tablet Take 10 mg by mouth daily.     LUTEIN PO Take by mouth.     Menatetrenone (VITAMIN K2) 100 MCG TABS Take by mouth.     NON FORMULARY Semaglutide 2.5 mg/Pyridoxine 10 mg inject 20 units subcutaneously weekly     OVER THE COUNTER MEDICATION Take 1 capsule by mouth daily. [employer] Mushroom 1200mg      Pediatric Multivitamins-Fl (MULTIVITAMIN + FLUORIDE) 0.25 MG CHEW      Ubiquinol 50 MG CAPS Take by mouth daily.     valACYclovir  (VALTREX ) 500 MG tablet Take 500 mg by mouth 2 (two) times daily as needed.     VITAMIN D PO 50 mcg.     No current facility-administered medications for this visit.    PHYSICAL EXAMINATION: ECOG PERFORMANCE STATUS: 1 - Symptomatic but completely ambulatory  Vitals:  03/22/23 0822  BP: 130/61  Pulse: 70  Resp: 18  Temp: 98.1 F (36.7 C)  SpO2: 100%   Filed Weights   03/22/23 0822  Weight: 175 lb 1.6 oz (79.4 kg)    Physical Exam   MEASUREMENTS: WT- 175      (exam performed in the presence of a chaperone)  LABORATORY DATA:  I have reviewed the data as listed    Latest Ref Rng & Units 11/23/2022    3:10 PM 06/07/2022    2:28 PM 02/16/2022    8:48 AM  CMP  Glucose 70 - 99 mg/dL 894  93  893   BUN 6 - 20 mg/dL 17  15  16    Creatinine 0.44 - 1.00 mg/dL 9.00  9.17  9.15   Sodium 135 - 145 mmol/L 143  141  142   Potassium 3.5 - 5.1 mmol/L 4.1  4.2  3.7   Chloride 98 - 111 mmol/L 106  105  106   CO2 22 - 32 mmol/L 31  29  30    Calcium 8.9 - 10.3 mg/dL 89.6  89.9  9.9   Total Protein 6.5 - 8.1 g/dL 7.5  7.4  7.0   Total  Bilirubin 0.3 - 1.2 mg/dL 0.4  0.4  0.3   Alkaline Phos 38 - 126 U/L 95  105  77   AST 15 - 41 U/L 19  19  18    ALT 0 - 44 U/L 20  20  24      Lab Results  Component Value Date   WBC 6.3 11/23/2022   HGB 13.8 11/23/2022   HCT 42.1 11/23/2022   MCV 94.8 11/23/2022   PLT 220 11/23/2022   NEUTROABS 3.2 11/23/2022    ASSESSMENT & PLAN:  Malignant neoplasm of lower-outer quadrant of right breast of female, estrogen receptor positive (HCC) Metastatic Breast cancer ER/PR and Her 2 Positive: S/p NAC with TCHP and BCS   Current Treatment: Herceptin  maintenance (until 04/12/2023) with Anastrozole  and Zometa  switched to letrozole  switched to exemestane  06/07/2022   Toxicities:  1.  Joint pains and stiffness:on letrozole  2. hot flashes 3. Diarrhea from Perjeta : Resolved since we stopped Perjeta    Brain: 06/08/2022: Negative Continue with Herceptin  maintenance monthly   Zometa  to every 6 months.   Patient has tried three different medications (Anastrozole , Letrozole , and Exemestane ) with side effects including weight gain and joint pain. Tamoxifen  and Faslodex were discussed as future options. -Continue Herceptin  indefinitely -Consider Tamoxifen  at a low dose once the pinched nerve is better   Breast cancer surveillance:  Mammogram 12/08/2022: Benign breast density category C  ------------------------------------- Assessment and Plan    Spinal Cancer Recent PET scans show no severe bone disease. New PET scan scheduled for March 7th, plan to move it up for a clearer picture of current status. - Move up PET scan - Review PET scan results  Pinched Nerve in the Back Progressively worsening over the past month, pain exacerbated when lying on her side, improves throughout the day. Recent PET scans show no severe bone disease. Discussed chiropractic manipulation, physical therapy, and dry needling as treatment options. Prefers seeing Dr.Wang Informed about risks and benefits of each treatment  option. - Move up PET scan - Refer to Dr. Cesario for possible spinal injection  General Health Maintenance Echocardiogram shows slight decrease in ejection fraction, may be a variation in the reading. Lost approximately 10 pounds since October. - Schedule echocardiogram for February 24th    Follow-up - Move  to doctor's schedule for scan review.          No orders of the defined types were placed in this encounter.  The patient has a good understanding of the overall plan. she agrees with it. she will call with any problems that may develop before the next visit here. Total time spent: 30 mins including face to face time and time spent for planning, charting and co-ordination of care   Kim K Latoiya Maradiaga, MD 03/22/23

## 2023-03-24 ENCOUNTER — Telehealth: Payer: Self-pay | Admitting: Hematology and Oncology

## 2023-03-24 NOTE — Telephone Encounter (Signed)
 Rescheduled appointments per 2/4 los. Patient is aware of the changes made to her upcoming appointments.

## 2023-03-30 ENCOUNTER — Encounter (HOSPITAL_COMMUNITY)
Admission: RE | Admit: 2023-03-30 | Discharge: 2023-03-30 | Disposition: A | Discharge status: Home / Self Care | Payer: Commercial Managed Care - PPO | Source: Ambulatory Visit | Attending: Hematology and Oncology | Admitting: Hematology and Oncology

## 2023-03-30 DIAGNOSIS — C50511 Malignant neoplasm of lower-outer quadrant of right female breast: Secondary | ICD-10-CM | POA: Insufficient documentation

## 2023-03-30 DIAGNOSIS — Z170 Estrogen receptor positive status [ER+]: Secondary | ICD-10-CM | POA: Diagnosis present

## 2023-03-30 DIAGNOSIS — C7951 Secondary malignant neoplasm of bone: Secondary | ICD-10-CM | POA: Diagnosis present

## 2023-03-30 LAB — GLUCOSE, CAPILLARY: Glucose-Capillary: 84 mg/dL (ref 70–99)

## 2023-03-30 MED ORDER — FLUDEOXYGLUCOSE F - 18 (FDG) INJECTION: 8.7500 | Freq: Once | INTRAVENOUS | Status: DC | PRN

## 2023-04-11 ENCOUNTER — Ambulatory Visit (HOSPITAL_COMMUNITY)
Admission: RE | Admit: 2023-04-11 | Discharge: 2023-04-11 | Disposition: A | Discharge status: Home / Self Care | Payer: Commercial Managed Care - PPO | Source: Ambulatory Visit | Attending: Hematology and Oncology | Admitting: Hematology and Oncology

## 2023-04-11 DIAGNOSIS — Z170 Estrogen receptor positive status [ER+]: Secondary | ICD-10-CM | POA: Insufficient documentation

## 2023-04-11 DIAGNOSIS — Z09 Encounter for follow-up examination after completed treatment for conditions other than malignant neoplasm: Secondary | ICD-10-CM | POA: Insufficient documentation

## 2023-04-11 DIAGNOSIS — Z0189 Encounter for other specified special examinations: Secondary | ICD-10-CM | POA: Diagnosis not present

## 2023-04-11 DIAGNOSIS — C50511 Malignant neoplasm of lower-outer quadrant of right female breast: Secondary | ICD-10-CM | POA: Insufficient documentation

## 2023-04-11 DIAGNOSIS — Z01818 Encounter for other preprocedural examination: Secondary | ICD-10-CM | POA: Insufficient documentation

## 2023-04-11 DIAGNOSIS — C7951 Secondary malignant neoplasm of bone: Secondary | ICD-10-CM | POA: Diagnosis not present

## 2023-04-11 LAB — ECHOCARDIOGRAM COMPLETE
AR max vel: 2.05 cm2
AV Area VTI: 1.86 cm2
AV Area mean vel: 1.85 cm2
AV Mean grad: 4 mmHg
AV Peak grad: 8.2 mmHg
Ao pk vel: 1.43 m/s
Area-P 1/2: 2.68 cm2
S' Lateral: 3.2 cm

## 2023-04-12 ENCOUNTER — Ambulatory Visit: Payer: Commercial Managed Care - PPO

## 2023-04-12 ENCOUNTER — Telehealth: Payer: Self-pay | Admitting: *Deleted

## 2023-04-12 NOTE — Telephone Encounter (Signed)
 Received VM from pt with complaint of severe ongoing hip pain.  Pt states she was supposed to undergo injection with [facility] but has not heard from their office despite several calls to them.  RN reviewed with MD who is waiting on PET scan results to further decided if MRI is needed for hip.  RN also placed call to [facility] and requested they contact pt today. RN attempt x1 to contact pt with plan, no answer, LVM.

## 2023-04-13 ENCOUNTER — Telehealth: Payer: Self-pay | Admitting: Hematology and Oncology

## 2023-04-13 NOTE — Telephone Encounter (Signed)
 Scheduled appointments per WQ. Patient is aware of the made appointments.

## 2023-04-14 ENCOUNTER — Other Ambulatory Visit: Payer: Self-pay

## 2023-04-16 ENCOUNTER — Other Ambulatory Visit: Payer: Self-pay

## 2023-04-19 ENCOUNTER — Ambulatory Visit: Payer: Commercial Managed Care - PPO | Admitting: Adult Health

## 2023-04-19 ENCOUNTER — Ambulatory Visit: Payer: Commercial Managed Care - PPO

## 2023-04-19 ENCOUNTER — Inpatient Hospital Stay: Payer: Commercial Managed Care - PPO | Attending: Hematology and Oncology

## 2023-04-19 ENCOUNTER — Inpatient Hospital Stay (HOSPITAL_BASED_OUTPATIENT_CLINIC_OR_DEPARTMENT_OTHER): Payer: Commercial Managed Care - PPO | Admitting: Hematology and Oncology

## 2023-04-19 VITALS — BP 120/76 | HR 58 | Temp 97.6°F | Resp 18 | Ht 67.0 in | Wt 170.5 lb

## 2023-04-19 DIAGNOSIS — C50511 Malignant neoplasm of lower-outer quadrant of right female breast: Secondary | ICD-10-CM

## 2023-04-19 DIAGNOSIS — Z5112 Encounter for antineoplastic immunotherapy: Secondary | ICD-10-CM | POA: Insufficient documentation

## 2023-04-19 DIAGNOSIS — C7951 Secondary malignant neoplasm of bone: Secondary | ICD-10-CM | POA: Diagnosis not present

## 2023-04-19 DIAGNOSIS — Z1731 Human epidermal growth factor receptor 2 positive status: Secondary | ICD-10-CM | POA: Diagnosis not present

## 2023-04-19 DIAGNOSIS — Z170 Estrogen receptor positive status [ER+]: Secondary | ICD-10-CM | POA: Insufficient documentation

## 2023-04-19 DIAGNOSIS — Z79620 Long term (current) use of immunosuppressive biologic: Secondary | ICD-10-CM | POA: Diagnosis not present

## 2023-04-19 MED ORDER — SODIUM CHLORIDE 0.9% FLUSH
10.0000 mL | INTRAVENOUS | Status: DC | PRN
  Administered 2023-04-19: 10 mL

## 2023-04-19 MED ORDER — SODIUM CHLORIDE 0.9 % IV SOLN: Freq: Once | INTRAVENOUS | Status: AC

## 2023-04-19 MED ORDER — HEPARIN SOD (PORK) LOCK FLUSH 100 UNIT/ML IV SOLN
500.0000 [IU] | Freq: Once | INTRAVENOUS | Status: AC | PRN
  Administered 2023-04-19: 500 [IU]

## 2023-04-19 MED ORDER — TRASTUZUMAB-ANNS CHEMO 150 MG IV SOLR
6.0000 mg/kg | Freq: Once | INTRAVENOUS | Status: AC
  Administered 2023-04-19: 504 mg via INTRAVENOUS
  Filled 2023-04-19: qty 24

## 2023-04-19 NOTE — Progress Notes (Signed)
 Patient Care Team: Exie Parody as PCP - General (Physician Assistant) Marcelle Overlie, MD as Consulting Physician (Obstetrics and Gynecology) Griselda Miner, MD as Consulting Physician (General Surgery) Antony Blackbird, MD as Consulting Physician (Radiation Oncology) Laurey Morale, MD as Consulting Physician (Cardiology) Serena Croissant, MD as Medical Oncologist (Hematology and Oncology)  DIAGNOSIS:  Encounter Diagnosis  Name Primary?   Malignant neoplasm of lower-outer quadrant of right breast of female, estrogen receptor positive (HCC) Yes    SUMMARY OF ONCOLOGIC HISTORY: Oncology History  Malignant neoplasm of lower-outer quadrant of right breast of female, estrogen receptor positive (HCC)  03/22/2016 Surgery   left lumpectomy 03/22/2016 showing atypical lobular hyperplasia prophylactic tamoxifen taken from 04/2016 through 03/2019   06/06/2019 Genetic Testing   Negative genetic testing:  No pathogenic variants detected on the [facility] panel, ordered by Dr. Vincente Poli at [facility]. Two variants of uncertain significance (VUS) were detected - one in the BRIP1 gene called c.2863A>C and a second in the NBN gene called c.643C>T. The report date is 06/06/2019.  The [facility] gene panel offered by [employer] includes sequencing and deletion/duplication testing of the following 35 genes: APC, ATM, AXIN2, BARD1, BMPR1A, BRCA1, BRCA2, BRIP1, CDH1, CDK4, CDKN2A, CHEK2, EPCAM (large rearrangement only), HOXB13 (sequencing only), GALNT12, MLH1, MSH2, MSH3 (excluding repetitive portions of exon 1), MSH6, MUTYH, NBN, NTHL1, PALB2, PMS2, PTEN, RAD51C, RAD51D, RNF43, RPS20, SMAD4, STK11, and TP53. Sequencing was performed for select regions of POLE and POLD1, and large rearrangement analysis was performed for select regions of GREM1.     06/25/2020 Relapse/Recurrence   right breast lower outer quadrant biopsy 06/25/2020 shows a clinical T2 N1, stage IB  invasive ductal carcinoma, grade 2 or 3, triple positive, with an MIB-1 of 40%   07/02/2020 Cancer Staging   Staging form: Breast, AJCC 8th Edition - Clinical stage from 07/02/2020: Stage IV (cT2, cN1, cM1, G3, ER+, PR+, HER2+) - Signed by Lowella Dell, MD on 08/04/2020 Stage prefix: Initial diagnosis Histologic grading system: 3 grade system Laterality: Right Staged by: Pathologist and managing physician Stage used in treatment planning: Yes National guidelines used in treatment planning: Yes Type of national guideline used in treatment planning: NCCN   07/16/2020 - 09/30/2020 Chemotherapy   neoadjuvant chemotherapy consisting of docetaxel, carboplatin, trastuzumab and pertuzumab every 21 days x 4 cycles started 07/28/2020, last chemotherapy 09/30/2020       08/01/2020 PET scan   PET scan 08/01/2020 shows no hypermetabolism in the 1.2 cm right liver lesion; bony hypermetabolism consistent with known mets   08/19/2020 - 10/21/2020 Chemotherapy   Patient is on Treatment Plan : BREAST  Trastuzumab + Pertuzumab q21d      10/07/2020 Imaging    total spinal MRI 10/07/2020 shows stable to improved disease, no extraosseous extension, pathologic fracture, epidural or intracanalicular involvement MRI of the breast 10/19/2020 shows a complete imaging response in the breast, with new ring-enhancing lesions in the left breast with MRI guided biopsy 11/10/2020 showing no evidence of malignancy   11/11/2020 - 01/18/2022 Chemotherapy   Patient is on Treatment Plan : BREAST Trastuzumab + Pertuzumab q21d     11/11/2020 - 04/12/2022 Chemotherapy   Patient is on Treatment Plan : BREAST Trastuzumab IV (8/6) or SQ (600) D1 q21d     01/02/2021 Surgery   right lumpectomy with no sentinel lymph node sampling 01/02/2021 showed scattered foci of residual invasive ductal carcinoma, grade 2, the largest measuring 0.8 cm.  Margins were negative  01/02/2021 -  Anti-estrogen oral therapy   anastrozole started  01/12/2021   05/11/2022 -  Chemotherapy   Patient is on Treatment Plan : BREAST MAINTENANCE Trastuzumab IV (6) or SQ (600) D1 q28d x 13 cycles     Malignant neoplasm metastatic to bone (HCC)  08/04/2020 Initial Diagnosis   Bone metastases (HCC)   11/11/2020 - 01/18/2022 Chemotherapy   Patient is on Treatment Plan : BREAST Trastuzumab + Pertuzumab q21d     11/11/2020 - 04/12/2022 Chemotherapy   Patient is on Treatment Plan : BREAST Trastuzumab IV (8/6) or SQ (600) D1 q21d       CHIEF COMPLIANT: Follow-up on Herceptin  HISTORY OF PRESENT ILLNESS:   History of Present Illness The patient, with a history of cancer and arthritis, presents with ongoing back pain. The pain is variable, sometimes allowing the patient to work all day without issue, but at other times it 'starts grabbing' after sitting for a short period. The pain is also positional, with certain positions causing discomfort.  The patient's recent PET scan showed no evidence of cancer in the lungs, liver, lymph nodes, or organs. The bone spots in the lumbar spine showed no activity, indicating the cancer is in remission. However, the scan did reveal arthritis in the lumbar facet, which is likely contributing to the patient's back pain.     ALLERGIES:  is allergic to ciprofloxacin, ciprofibrate, gabapentin, sulfa antibiotics, and venlafaxine.  MEDICATIONS:  Current Outpatient Medications  Medication Sig Dispense Refill   Biotin 5 MG CAPS Take 1 capsule (5 mg total) by mouth daily.  0   Boron 3 MG CAPS Take by mouth.     Calcium 200 MG TABS      Calcium 500 MG tablet Take 600 mg by mouth.     Cholecalciferol (VITAMIN D) 10 MCG/ML LIQD      ipratropium (ATROVENT) 0.06 % nasal spray Place 1 spray into both nostrils 3 (three) times daily. 15 mL 3   lidocaine-prilocaine (EMLA) cream Apply topically continuous as needed. 30 g 1   LUTEIN PO Take by mouth.     Menatetrenone (VITAMIN K2) 100 MCG TABS Take by mouth.     NON FORMULARY  Semaglutide 2.5 mg/Pyridoxine 10 mg inject 20 units subcutaneously weekly     OVER THE COUNTER MEDICATION Take 1 capsule by mouth daily. [employer] Mushroom 1200mg      Pediatric Multivitamins-Fl (MULTIVITAMIN + FLUORIDE) 0.25 MG CHEW      valACYclovir (VALTREX) 500 MG tablet Take 500 mg by mouth 2 (two) times daily as needed.     VITAMIN D PO 50 mcg.     loratadine (CLARITIN) 10 MG tablet Take 10 mg by mouth daily. (Patient not taking: Reported on 04/19/2023)     Ubiquinol 50 MG CAPS Take by mouth daily. (Patient not taking: Reported on 04/19/2023)     No current facility-administered medications for this visit.   Facility-Administered Medications Ordered in Other Visits  Medication Dose Route Frequency Provider Last Rate Last Admin   heparin lock flush 100 unit/mL  500 Units Intracatheter Once PRN Serena Croissant, MD       sodium chloride flush (NS) 0.9 % injection 10 mL  10 mL Intracatheter PRN Serena Croissant, MD       trastuzumab-anns Ernestine Mcmurray) 504 mg in sodium chloride 0.9 % 250 mL chemo infusion  6 mg/kg (Treatment Plan Recorded) Intravenous Once Serena Croissant, MD 548 mL/hr at 04/19/23 1619 504 mg at 04/19/23 1619    PHYSICAL EXAMINATION:  ECOG PERFORMANCE STATUS: 1 - Symptomatic but completely ambulatory  Vitals:   04/19/23 1500  BP: 120/76  Pulse: (!) 58  Resp: 18  Temp: 97.6 F (36.4 C)  SpO2: 99%   Filed Weights   04/19/23 1500  Weight: 170 lb 8 oz (77.3 kg)      LABORATORY DATA:  I have reviewed the data as listed    Latest Ref Rng & Units 11/23/2022    3:10 PM 06/07/2022    2:28 PM 02/16/2022    8:48 AM  CMP  Glucose 70 - 99 mg/dL 782  93  956   BUN 6 - 20 mg/dL 17  15  16    Creatinine 0.44 - 1.00 mg/dL 2.13  0.86  5.78   Sodium 135 - 145 mmol/L 143  141  142   Potassium 3.5 - 5.1 mmol/L 4.1  4.2  3.7   Chloride 98 - 111 mmol/L 106  105  106   CO2 22 - 32 mmol/L 31  29  30    Calcium 8.9 - 10.3 mg/dL 46.9  62.9  9.9   Total Protein 6.5 - 8.1 g/dL 7.5  7.4  7.0    Total Bilirubin 0.3 - 1.2 mg/dL 0.4  0.4  0.3   Alkaline Phos 38 - 126 U/L 95  105  77   AST 15 - 41 U/L 19  19  18    ALT 0 - 44 U/L 20  20  24      Lab Results  Component Value Date   WBC 6.3 11/23/2022   HGB 13.8 11/23/2022   HCT 42.1 11/23/2022   MCV 94.8 11/23/2022   PLT 220 11/23/2022   NEUTROABS 3.2 11/23/2022    ASSESSMENT & PLAN:  Malignant neoplasm of lower-outer quadrant of right breast of female, estrogen receptor positive (HCC) Metastatic Breast cancer ER/PR and Her 2 Positive: S/p NAC with TCHP and BCS   Current Treatment: Herceptin maintenance with Anastrozole and Zometa switched to letrozole switched to exemestane 06/07/2022   Toxicities:  1.  Joint pains and stiffness:on letrozole 2. hot flashes 3. Diarrhea from Perjeta: Resolved since we stopped Perjeta   Brain: 06/08/2022: Negative Continue with Herceptin maintenance monthly   Zometa to every 6 months.   Patient has tried three different medications (Anastrozole, Letrozole, and Exemestane) with side effects including weight gain and joint pain. Tamoxifen and Faslodex were discussed as future options. -Continue Herceptin indefinitely (echo 04/11/2023: EF 65%) -Consider Tamoxifen at a low dose once the pinched nerve is better   Breast cancer surveillance:  Mammogram 12/08/2022: Benign breast density category C Breast exam 04/19/2023: Benign PET/CT 04/12/2023: No avid tracer uptake for recurrent or metastatic disease.  Similar sclerotic lesions in thoracolumbar spine none tracer avid  Weight loss: Lost 10 pounds since October. ------------------------------------- Assessment and Plan Assessment & Plan Breast cancer (estrogen receptor-positive) Estrogen receptor-positive breast cancer with no metastatic evidence on PET scan. Currently on Herceptin. She was hesitant to start tamoxifen due to potential side effects, despite understanding its role in reducing recurrence risk by approximately 50%. - Hold off on  starting tamoxifen until back pain improves. - Continue Herceptin therapy. - Reassess the need for tamoxifen in future visits.  Lumbar facet arthritis Left lumbar facet arthritis contributing to back pain. PET scan shows no cancer in lumbar spine; bone spots inactive, indicating inflammation. - Proceed with scheduled lumbar injection on March 12th to alleviate back pain. - Coordinate with orthopedic specialist at [facility] for further evaluation and management  of lumbar facet arthritis.      No orders of the defined types were placed in this encounter.  The patient has a good understanding of the overall plan. she agrees with it. she will call with any problems that may develop before the next visit here. Total time spent: 30 mins including face to face time and time spent for planning, charting and co-ordination of care   Tamsen Meek, MD 04/19/23

## 2023-04-19 NOTE — Assessment & Plan Note (Signed)
 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  Joint pains and stiffness:on letrozole 2. hot flashes 3. Diarrhea from Perjeta: Resolved since we stopped Perjeta   Brain: 06/08/2022: Negative Continue with Herceptin maintenance monthly   Zometa to every 6 months.   Patient has tried three different medications (Anastrozole, Letrozole, and Exemestane) with side effects including weight gain and joint pain. Tamoxifen and Faslodex were discussed as future options. -Continue Herceptin indefinitely (echo 04/11/2023: EF 65%) -Consider Tamoxifen at a low dose once the pinched nerve is better   Breast cancer surveillance:  Mammogram 12/08/2022: Benign breast density category C Breast exam 04/19/2023: Benign PET/CT 04/12/2023: No avid tracer uptake for recurrent or metastatic disease.  Similar sclerotic lesions in thoracolumbar spine none tracer avid  Weight loss: Lost 10 pounds since October.

## 2023-05-11 ENCOUNTER — Ambulatory Visit: Payer: Commercial Managed Care - PPO | Admitting: Hematology and Oncology

## 2023-05-11 ENCOUNTER — Ambulatory Visit: Payer: Commercial Managed Care - PPO

## 2023-05-17 ENCOUNTER — Encounter: Payer: Self-pay | Admitting: Hematology and Oncology

## 2023-05-17 ENCOUNTER — Inpatient Hospital Stay

## 2023-05-17 ENCOUNTER — Inpatient Hospital Stay: Payer: Commercial Managed Care - PPO | Attending: Hematology and Oncology

## 2023-05-17 VITALS — BP 122/70 | HR 59 | Temp 98.2°F | Resp 18 | Wt 170.1 lb

## 2023-05-17 DIAGNOSIS — C7951 Secondary malignant neoplasm of bone: Secondary | ICD-10-CM | POA: Insufficient documentation

## 2023-05-17 DIAGNOSIS — Z170 Estrogen receptor positive status [ER+]: Secondary | ICD-10-CM | POA: Insufficient documentation

## 2023-05-17 DIAGNOSIS — Z79620 Long term (current) use of immunosuppressive biologic: Secondary | ICD-10-CM | POA: Insufficient documentation

## 2023-05-17 DIAGNOSIS — Z1731 Human epidermal growth factor receptor 2 positive status: Secondary | ICD-10-CM | POA: Insufficient documentation

## 2023-05-17 DIAGNOSIS — C50511 Malignant neoplasm of lower-outer quadrant of right female breast: Secondary | ICD-10-CM | POA: Insufficient documentation

## 2023-05-17 DIAGNOSIS — M899 Disorder of bone, unspecified: Secondary | ICD-10-CM

## 2023-05-17 DIAGNOSIS — Z95828 Presence of other vascular implants and grafts: Secondary | ICD-10-CM

## 2023-05-17 DIAGNOSIS — Z7189 Other specified counseling: Secondary | ICD-10-CM

## 2023-05-17 DIAGNOSIS — Z5112 Encounter for antineoplastic immunotherapy: Secondary | ICD-10-CM | POA: Insufficient documentation

## 2023-05-17 LAB — CBC WITH DIFFERENTIAL (CANCER CENTER ONLY)
Abs Immature Granulocytes: 0.02 10*3/uL (ref 0.00–0.07)
Basophils Absolute: 0 10*3/uL (ref 0.0–0.1)
Basophils Relative: 0 %
Eosinophils Absolute: 0.1 10*3/uL (ref 0.0–0.5)
Eosinophils Relative: 2 %
HCT: 37.5 % (ref 36.0–46.0)
Hemoglobin: 12.4 g/dL (ref 12.0–15.0)
Immature Granulocytes: 0 %
Lymphocytes Relative: 36 %
Lymphs Abs: 2.2 10*3/uL (ref 0.7–4.0)
MCH: 31.1 pg (ref 26.0–34.0)
MCHC: 33.1 g/dL (ref 30.0–36.0)
MCV: 94 fL (ref 80.0–100.0)
Monocytes Absolute: 0.3 10*3/uL (ref 0.1–1.0)
Monocytes Relative: 5 %
Neutro Abs: 3.6 10*3/uL (ref 1.7–7.7)
Neutrophils Relative %: 57 %
Platelet Count: 244 10*3/uL (ref 150–400)
RBC: 3.99 MIL/uL (ref 3.87–5.11)
RDW: 12.9 % (ref 11.5–15.5)
WBC Count: 6.2 10*3/uL (ref 4.0–10.5)
nRBC: 0 % (ref 0.0–0.2)

## 2023-05-17 LAB — CMP (CANCER CENTER ONLY)
ALT: 37 U/L (ref 0–44)
AST: 22 U/L (ref 15–41)
Albumin: 4.5 g/dL (ref 3.5–5.0)
Alkaline Phosphatase: 70 U/L (ref 38–126)
Anion gap: 7 (ref 5–15)
BUN: 16 mg/dL (ref 6–20)
CO2: 27 mmol/L (ref 22–32)
Calcium: 9.6 mg/dL (ref 8.9–10.3)
Chloride: 108 mmol/L (ref 98–111)
Creatinine: 0.84 mg/dL (ref 0.44–1.00)
GFR, Estimated: >60 mL/min (ref >60)
Glucose, Bld: 112 mg/dL — ABNORMAL HIGH (ref 70–99)
Potassium: 4 mmol/L (ref 3.5–5.1)
Sodium: 142 mmol/L (ref 135–145)
Total Bilirubin: 0.4 mg/dL (ref 0.0–1.2)
Total Protein: 7.2 g/dL (ref 6.5–8.1)

## 2023-05-17 LAB — MAGNESIUM: Magnesium: 2.3 mg/dL (ref 1.7–2.4)

## 2023-05-17 MED ORDER — SODIUM CHLORIDE 0.9 % IV SOLN
Freq: Once | INTRAVENOUS | Status: AC
Start: 2023-05-17 — End: 2023-05-17

## 2023-05-17 MED ORDER — SODIUM CHLORIDE 0.9 % IV SOLN
6.0000 mg/kg | Freq: Once | INTRAVENOUS | Status: AC
  Administered 2023-05-17: 504 mg via INTRAVENOUS
  Filled 2023-05-17: qty 24

## 2023-05-17 MED ORDER — SODIUM CHLORIDE 0.9% FLUSH: 10.0000 mL | INTRAVENOUS | Status: DC | PRN

## 2023-05-17 MED ORDER — HEPARIN SOD (PORK) LOCK FLUSH 100 UNIT/ML IV SOLN: 500.0000 [IU] | Freq: Once | INTRAVENOUS | Status: DC | PRN

## 2023-05-17 MED ORDER — ZOLEDRONIC ACID 4 MG/100ML IV SOLN
4.0000 mg | Freq: Once | INTRAVENOUS | Status: AC
  Administered 2023-05-17: 4 mg via INTRAVENOUS

## 2023-05-17 MED ORDER — SODIUM CHLORIDE 0.9% FLUSH: 10.0000 mL | Freq: Once | INTRAVENOUS | Status: DC

## 2023-05-17 NOTE — Patient Instructions (Signed)
 [facility] - A DEPT OF [facility]  Discharge Instructions: Thank you for choosing [facility] to provide your oncology and hematology care.   If you have a lab appointment with the Cancer Center, please go directly to the Cancer Center and check in at the registration area.   Wear comfortable clothing and clothing appropriate for easy access to any Portacath or PICC line.   We strive to give you quality time with your provider. You may need to reschedule your appointment if you arrive late (15 or more minutes).  Arriving late affects you and other patients whose appointments are after yours.  Also, if you miss three or more appointments without notifying the office, you may be dismissed from the clinic at the provider's discretion.      For prescription refill requests, have your pharmacy contact our office and allow 72 hours for refills to be completed.    Today you received the following chemotherapy and/or immunotherapy agents: Kanjinti.       To help prevent nausea and vomiting after your treatment, we encourage you to take your nausea medication as directed.  BELOW ARE SYMPTOMS THAT SHOULD BE REPORTED IMMEDIATELY: *FEVER GREATER THAN 100.4 F (38 C) OR HIGHER *CHILLS OR SWEATING *NAUSEA AND VOMITING THAT IS NOT CONTROLLED WITH YOUR NAUSEA MEDICATION *UNUSUAL SHORTNESS OF BREATH *UNUSUAL BRUISING OR BLEEDING *URINARY PROBLEMS (pain or burning when urinating, or frequent urination) *BOWEL PROBLEMS (unusual diarrhea, constipation, pain near the anus) TENDERNESS IN MOUTH AND THROAT WITH OR WITHOUT PRESENCE OF ULCERS (sore throat, sores in mouth, or a toothache) UNUSUAL RASH, SWELLING OR PAIN  UNUSUAL VAGINAL DISCHARGE OR ITCHING   Items with * indicate a potential emergency and should be followed up as soon as possible or go to the Emergency Department if any problems should occur.  Please show the CHEMOTHERAPY ALERT CARD or IMMUNOTHERAPY  ALERT CARD at check-in to the Emergency Department and triage nurse.  Should you have questions after your visit or need to cancel or reschedule your appointment, please contact [facility] - A DEPT OF [facility]  Dept: [phone]  and follow the prompts.  Office hours are 8:00 a.m. to 4:30 p.m. Monday - Friday. Please note that voicemails left after 4:00 p.m. may not be returned until the following business day.  We are closed weekends and major holidays. You have access to a nurse at all times for urgent questions. Please call the main number to the clinic Dept: [phone] and follow the prompts.   For any non-urgent questions, you may also contact your provider using MyChart. We now offer e-Visits for anyone 70 and older to request care online for non-urgent symptoms. For details visit [email or web address].   Also download the MyChart app! Go to the app store, search "MyChart", open the app, select [facility], and log in with your MyChart username and password.  Zoledronic Acid Injection (Cancer) What is this medication? ZOLEDRONIC ACID (ZOE le dron ik AS id) treats high calcium levels in the blood caused by cancer. It may also be used with chemotherapy to treat weakened bones caused by cancer. It works by slowing down the release of calcium from bones. This lowers calcium levels in your blood. It also makes your bones stronger and less likely to break (fracture). It belongs to a group of medications called bisphosphonates. This medicine may be used for other purposes; ask your health care  provider or pharmacist if you have questions. COMMON BRAND NAME(S): Zometa, Zometa Powder What should I tell my care team before I take this medication? They need to know if you have any of these conditions: Dehydration Dental disease Kidney disease Liver disease Low levels of calcium in the blood Lung or breathing disease, such as asthma Receiving steroids, such as  dexamethasone or prednisone An unusual or allergic reaction to zoledronic acid, other medications, foods, dyes, or preservatives Pregnant or trying to get pregnant Breast-feeding How should I use this medication? This medication is injected into a vein. It is given by your care team in a hospital or clinic setting. Talk to your care team about the use of this medication in children. Special care may be needed. Overdosage: If you think you have taken too much of this medicine contact a poison control center or emergency room at once. NOTE: This medicine is only for you. Do not share this medicine with others. What if I miss a dose? Keep appointments for follow-up doses. It is important not to miss your dose. Call your care team if you are unable to keep an appointment. What may interact with this medication? Certain antibiotics given by injection Diuretics, such as bumetanide, furosemide NSAIDs, medications for pain and inflammation, such as ibuprofen or naproxen Teriparatide Thalidomide This list may not describe all possible interactions. Give your health care provider a list of all the medicines, herbs, non-prescription drugs, or dietary supplements you use. Also tell them if you smoke, drink alcohol, or use illegal drugs. Some items may interact with your medicine. What should I watch for while using this medication? Visit your care team for regular checks on your progress. It may be some time before you see the benefit from this medication. Some people who take this medication have severe bone, joint, or muscle pain. This medication may also increase your risk for jaw problems or a broken thigh bone. Tell your care team right away if you have severe pain in your jaw, bones, joints, or muscles. Tell you care team if you have any pain that does not go away or that gets worse. Tell your dentist and dental surgeon that you are taking this medication. You should not have major dental surgery  while on this medication. See your dentist to have a dental exam and fix any dental problems before starting this medication. Take good care of your teeth while on this medication. Make sure you see your dentist for regular follow-up appointments. You should make sure you get enough calcium and vitamin D while you are taking this medication. Discuss the foods you eat and the vitamins you take with your care team. Check with your care team if you have severe diarrhea, nausea, and vomiting, or if you sweat a lot. The loss of too much body fluid may make it dangerous for you to take this medication. You may need bloodwork while taking this medication. Talk to your care team if you wish to become pregnant or think you might be pregnant. This medication can cause serious birth defects. What side effects may I notice from receiving this medication? Side effects that you should report to your care team as soon as possible: Allergic reactions--skin rash, itching, hives, swelling of the face, lips, tongue, or throat Kidney injury--decrease in the amount of urine, swelling of the ankles, hands, or feet Low calcium level--muscle pain or cramps, confusion, tingling, or numbness in the hands or feet Osteonecrosis of the jaw--pain, swelling, or  redness in the mouth, numbness of the jaw, poor healing after dental work, unusual discharge from the mouth, visible bones in the mouth Severe bone, joint, or muscle pain Side effects that usually do not require medical attention (report to your care team if they continue or are bothersome): Constipation Fatigue Fever Loss of appetite Nausea Stomach pain This list may not describe all possible side effects. Call your doctor for medical advice about side effects. You may report side effects to FDA at 1-800-FDA-1088. Where should I keep my medication? This medication is given in a hospital or clinic. It will not be stored at home. NOTE: This sheet is a summary. It may  not cover all possible information. If you have questions about this medicine, talk to your doctor, pharmacist, or health care provider.  2024 Elsevier/Gold Standard (2021-03-27 00:00:00)

## 2023-05-17 NOTE — Progress Notes (Signed)
 Pt reports she took her Tylenol at home.

## 2023-05-23 ENCOUNTER — Other Ambulatory Visit: Payer: Self-pay | Admitting: Physical Medicine and Rehabilitation

## 2023-05-23 DIAGNOSIS — M545 Low back pain, unspecified: Secondary | ICD-10-CM

## 2023-05-25 ENCOUNTER — Other Ambulatory Visit: Payer: Self-pay | Admitting: Physical Medicine and Rehabilitation

## 2023-06-06 ENCOUNTER — Ambulatory Visit
Admission: RE | Admit: 2023-06-06 | Discharge: 2023-06-06 | Disposition: A | Discharge status: Home / Self Care | Source: Ambulatory Visit | Attending: Physical Medicine and Rehabilitation | Admitting: Physical Medicine and Rehabilitation

## 2023-06-06 DIAGNOSIS — M545 Low back pain, unspecified: Secondary | ICD-10-CM

## 2023-06-14 ENCOUNTER — Inpatient Hospital Stay: Payer: Commercial Managed Care - PPO

## 2023-06-14 ENCOUNTER — Inpatient Hospital Stay (HOSPITAL_BASED_OUTPATIENT_CLINIC_OR_DEPARTMENT_OTHER): Payer: Commercial Managed Care - PPO | Admitting: Hematology and Oncology

## 2023-06-14 VITALS — BP 120/77 | HR 62 | Temp 98.6°F | Resp 18 | Ht 67.0 in | Wt 162.2 lb

## 2023-06-14 DIAGNOSIS — Z170 Estrogen receptor positive status [ER+]: Secondary | ICD-10-CM

## 2023-06-14 DIAGNOSIS — C50511 Malignant neoplasm of lower-outer quadrant of right female breast: Secondary | ICD-10-CM | POA: Diagnosis not present

## 2023-06-14 DIAGNOSIS — Z5112 Encounter for antineoplastic immunotherapy: Secondary | ICD-10-CM | POA: Diagnosis not present

## 2023-06-14 MED ORDER — SODIUM CHLORIDE 0.9 % IV SOLN: Freq: Once | INTRAVENOUS | Status: AC

## 2023-06-14 MED ORDER — ACETAMINOPHEN 325 MG PO TABS
650.0000 mg | ORAL_TABLET | Freq: Once | ORAL | Status: DC
Start: 2023-06-14 — End: 2023-06-14

## 2023-06-14 MED ORDER — TRASTUZUMAB-ANNS CHEMO 150 MG IV SOLR
450.0000 mg | Freq: Once | INTRAVENOUS | Status: AC
  Administered 2023-06-14: 450 mg via INTRAVENOUS
  Filled 2023-06-14: qty 21.43

## 2023-06-14 MED ORDER — SODIUM CHLORIDE 0.9% FLUSH
10.0000 mL | INTRAVENOUS | Status: DC | PRN
Start: 2023-06-14 — End: 2023-06-14
  Administered 2023-06-14: 10 mL

## 2023-06-14 MED ORDER — HEPARIN SOD (PORK) LOCK FLUSH 100 UNIT/ML IV SOLN
500.0000 [IU] | Freq: Once | INTRAVENOUS | Status: AC | PRN
Start: 2023-06-14 — End: 2023-06-14
  Administered 2023-06-14: 500 [IU]

## 2023-06-14 NOTE — Progress Notes (Signed)
 Update Kanjinti  dose to 6mg /kg Q 28 days based on most recent weight.  Jobe Mulder Sil[place], [place], BCPS, BCOP 06/14/2023 3:58 PM

## 2023-06-14 NOTE — Patient Instructions (Signed)

## 2023-06-14 NOTE — Assessment & Plan Note (Signed)
 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   Toxicities:  1.  Joint pains and stiffness:on letrozole  2. hot flashes 3. Diarrhea from Perjeta : Resolved since we stopped Perjeta    Brain: 06/08/2022: Negative Continue with Herceptin  maintenance monthly   Zometa  to every 6 months.   Patient has tried three different medications (Anastrozole , Letrozole , and Exemestane ) with side effects including weight gain and joint pain. Tamoxifen  and Faslodex were discussed as future options. -Continue Herceptin  indefinitely (echo 04/11/2023: EF 65%) -Consider Tamoxifen  at a low dose once the pinched nerve is better   Breast cancer surveillance:  Mammogram 12/08/2022: Benign breast density category C Breast exam 04/19/2023: Benign PET/CT 04/12/2023: No avid tracer uptake for recurrent or metastatic disease.  Similar sclerotic lesions in thoracolumbar spine none tracer avid   Lumbar facet arthritis: Status post injection on 04/27/2023 Weight loss: Lost 10 pounds since October.

## 2023-06-14 NOTE — Progress Notes (Addendum)
 Patient Care Team: [name] as PCP - General (Physician Assistant) Mat Browning, MD as Consulting Physician (Obstetrics and Gynecology) Curvin Deward MOULD, MD as Consulting Physician (General Surgery) Shannon Agent, MD as Consulting Physician (Radiation Oncology) Rolan Ezra RAMAN, MD as Consulting Physician (Cardiology) Odean Potts, MD as Medical Oncologist (Hematology and Oncology)  DIAGNOSIS:  Encounter Diagnosis  Name Primary?   Malignant neoplasm of lower-outer quadrant of right breast of female, estrogen receptor positive (HCC) Yes    SUMMARY OF ONCOLOGIC HISTORY: Oncology History  Malignant neoplasm of lower-outer quadrant of right breast of female, estrogen receptor positive (HCC)  03/22/2016 Surgery   left lumpectomy 03/22/2016 showing atypical lobular hyperplasia prophylactic tamoxifen  taken from 04/2016 through 03/2019   06/06/2019 Genetic Testing   Negative genetic testing:  No pathogenic variants detected on the [facility] panel, ordered by Dr. Mat at [facility]. Two variants of uncertain significance (VUS) were detected - one in the BRIP1 gene called c.2863A>C and a second in the NBN gene called c.643C>T. The report date is 06/06/2019.  The [facility] gene panel offered by [employer] includes sequencing and deletion/duplication testing of the following 35 genes: APC, ATM, AXIN2, BARD1, BMPR1A, BRCA1, BRCA2, BRIP1, CDH1, CDK4, CDKN2A, CHEK2, EPCAM (large rearrangement only), HOXB13 (sequencing only), GALNT12, MLH1, MSH2, MSH3 (excluding repetitive portions of exon 1), MSH6, MUTYH, NBN, NTHL1, PALB2, PMS2, PTEN, RAD51C, RAD51D, RNF43, RPS20, SMAD4, STK11, and TP53. Sequencing was performed for select regions of POLE and POLD1, and large rearrangement analysis was performed for select regions of GREM1.     06/25/2020 Relapse/Recurrence   right breast lower outer quadrant biopsy 06/25/2020 shows a clinical T2 N1, stage IB  invasive ductal carcinoma, grade 2 or 3, triple positive, with an MIB-1 of 40%   07/02/2020 Cancer Staging   Staging form: Breast, AJCC 8th Edition - Clinical stage from 07/02/2020: Stage IV (cT2, cN1, cM1, G3, ER+, PR+, HER2+) - Signed by Layla Sandria BROCKS, MD on 08/04/2020 Stage prefix: Initial diagnosis Histologic grading system: 3 grade system Laterality: Right Staged by: Pathologist and managing physician Stage used in treatment planning: Yes National guidelines used in treatment planning: Yes Type of national guideline used in treatment planning: NCCN   07/16/2020 - 09/30/2020 Chemotherapy   neoadjuvant chemotherapy consisting of docetaxel , carboplatin , trastuzumab  and pertuzumab  every 21 days x 4 cycles started 07/28/2020, last chemotherapy 09/30/2020       08/01/2020 PET scan   PET scan 08/01/2020 shows no hypermetabolism in the 1.2 cm right liver lesion; bony hypermetabolism consistent with known mets   08/19/2020 - 10/21/2020 Chemotherapy   Patient is on Treatment Plan : BREAST  Trastuzumab  + Pertuzumab  q21d      10/07/2020 Imaging    total spinal MRI 10/07/2020 shows stable to improved disease, no extraosseous extension, pathologic fracture, epidural or intracanalicular involvement MRI of the breast 10/19/2020 shows a complete imaging response in the breast, with new ring-enhancing lesions in the left breast with MRI guided biopsy 11/10/2020 showing no evidence of malignancy   11/11/2020 - 01/18/2022 Chemotherapy   Patient is on Treatment Plan : BREAST Trastuzumab  + Pertuzumab  q21d     11/11/2020 - 04/12/2022 Chemotherapy   Patient is on Treatment Plan : BREAST Trastuzumab  IV (8/6) or SQ (600) D1 q21d     01/02/2021 Surgery   right lumpectomy with no sentinel lymph node sampling 01/02/2021 showed scattered foci of residual invasive ductal carcinoma, grade 2, the largest measuring 0.8 cm.  Margins were negative  01/02/2021 -  Anti-estrogen oral therapy   anastrozole  started  01/12/2021   05/11/2022 -  Chemotherapy   Patient is on Treatment Plan : BREAST MAINTENANCE Trastuzumab  IV (6) or SQ (600) D1 q28d x 13 cycles     Malignant neoplasm metastatic to bone (HCC)  08/04/2020 Initial Diagnosis   Bone metastases (HCC)   11/11/2020 - 01/18/2022 Chemotherapy   Patient is on Treatment Plan : BREAST Trastuzumab  + Pertuzumab  q21d     11/11/2020 - 04/12/2022 Chemotherapy   Patient is on Treatment Plan : BREAST Trastuzumab  IV (8/6) or SQ (600) D1 q21d       CHIEF COMPLIANT:   HISTORY OF PRESENT ILLNESS: History of Present Illness Kim Archer is a 58 year old female who presents for follow-up on Herceptin  treatment.  She has been continuing to have issues with pinched nerve in the back.  She experiences persistent buttock pain, initially thought to be due to a pinched nerve, later suspected to involve the sacroiliac joint. An injection was administered in that area, but the pain persists. Another injection is scheduled. The pain is most severe in the mornings, particularly during activities like getting up and showering, with some relief from movement. She is taking meloxicam and a muscle relaxer but is concerned about long-term use.  A PET scan was performed at the end of February 2025 for routine monitoring of metastatic breast cancer, with no further scans required for a year.     ALLERGIES:  is allergic to ciprofloxacin, ciprofibrate, gabapentin , sulfa antibiotics, and venlafaxine .  MEDICATIONS:  Current Outpatient Medications  Medication Sig Dispense Refill   Biotin  5 MG CAPS Take 1 capsule (5 mg total) by mouth daily.  0   Boron 3 MG CAPS Take by mouth.     Calcium 200 MG TABS      Calcium 500 MG tablet Take 600 mg by mouth.     Cholecalciferol (VITAMIN D) 10 MCG/ML LIQD      ipratropium (ATROVENT ) 0.06 % nasal spray Place 1 spray into both nostrils 3 (three) times daily. 15 mL 3   lidocaine -prilocaine  (EMLA ) cream Apply topically continuous as needed.  30 g 1   LUTEIN PO Take by mouth.     meloxicam (MOBIC) 7.5 MG tablet Take 7.5 mg by mouth daily. 1@ night     Menatetrenone (VITAMIN K2) 100 MCG TABS Take by mouth.     NON FORMULARY Semaglutide 2.5 mg/Pyridoxine 10 mg inject 20 units subcutaneously weekly     OVER THE COUNTER MEDICATION Take 1 capsule by mouth daily. [employer] Mushroom 1200mg      Pediatric Multivitamins-Fl (MULTIVITAMIN + FLUORIDE) 0.25 MG CHEW      tiZANidine (ZANAFLEX) 4 MG capsule Take 4 mg by mouth 3 (three) times daily. 1@ night     Ubiquinol 50 MG CAPS Take by mouth daily.     VITAMIN D PO 50 mcg.     valACYclovir  (VALTREX ) 500 MG tablet Take 500 mg by mouth 2 (two) times daily as needed. (Patient not taking: Reported on 06/14/2023)     No current facility-administered medications for this visit.   Facility-Administered Medications Ordered in Other Visits  Medication Dose Route Frequency Provider Last Rate Last Admin   acetaminophen  (TYLENOL ) tablet 650 mg  650 mg Oral Once Charlise Giovanetti, MD       heparin  lock flush 100 unit/mL  500 Units Intracatheter Once PRN Kerriann Kamphuis, MD       sodium chloride  flush (NS) 0.9 % injection  10 mL  10 mL Intracatheter PRN Odean Potts, MD       trastuzumab -anns (KANJINTI ) 504 mg in sodium chloride  0.9 % 250 mL chemo infusion  6 mg/kg (Treatment Plan Recorded) Intravenous Once Odean Potts, MD        PHYSICAL EXAMINATION: ECOG PERFORMANCE STATUS: 1 - Symptomatic but completely ambulatory  Vitals:   06/14/23 1441  BP: 120/77  Pulse: 62  Resp: 18  Temp: 98.6 F (37 C)  SpO2: 98%   Filed Weights   06/14/23 1441  Weight: 162 lb 3.2 oz (73.6 kg)    LABORATORY DATA:  I have reviewed the data as listed    Latest Ref Rng & Units 05/17/2023    2:31 PM 11/23/2022    3:10 PM 06/07/2022    2:28 PM  CMP  Glucose 70 - 99 mg/dL 887  894  93   BUN 6 - 20 mg/dL 16  17  15    Creatinine 0.44 - 1.00 mg/dL 9.15  9.00  9.17   Sodium 135 - 145 mmol/L 142  143  141   Potassium 3.5 -  5.1 mmol/L 4.0  4.1  4.2   Chloride 98 - 111 mmol/L 108  106  105   CO2 22 - 32 mmol/L 27  31  29    Calcium 8.9 - 10.3 mg/dL 9.6  89.6  89.9   Total Protein 6.5 - 8.1 g/dL 7.2  7.5  7.4   Total Bilirubin 0.0 - 1.2 mg/dL 0.4  0.4  0.4   Alkaline Phos 38 - 126 U/L 70  95  105   AST 15 - 41 U/L 22  19  19    ALT 0 - 44 U/L 37  20  20     Lab Results  Component Value Date   WBC 6.2 05/17/2023   HGB 12.4 05/17/2023   HCT 37.5 05/17/2023   MCV 94.0 05/17/2023   PLT 244 05/17/2023   NEUTROABS 3.6 05/17/2023    ASSESSMENT & PLAN:  Malignant neoplasm of lower-outer quadrant of right breast of female, estrogen receptor positive (HCC) Metastatic Breast cancer ER/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   Toxicities:  1.  Joint pains and stiffness:on letrozole  2. hot flashes 3. Diarrhea from Perjeta : Resolved since we stopped Perjeta    Brain: 06/08/2022: Negative Continue with Herceptin  maintenance monthly   Zometa  to every 6 months.   Patient has tried three different medications (Anastrozole , Letrozole , and Exemestane ) with side effects including weight gain and joint pain. Tamoxifen  and Faslodex were discussed as future options.  -Continue Herceptin  indefinitely (echo 04/11/2023: EF 65%): Every 4 weeks -Consider Tamoxifen  at a low dose once the pinched nerve is better   Breast cancer surveillance:  Mammogram 12/08/2022: Benign breast density category C Breast exam 04/19/2023: Benign PET/CT 04/12/2023: No avid tracer uptake for recurrent or metastatic disease.  Similar sclerotic lesions in thoracolumbar spine none tracer avid   Lumbar facet arthritis: Status post injection on 04/27/2023 MRI of the back was performed and L4-L5 abnormalities were noted.  She is scheduled to undergo another injection.  Assessment &  Plan Malignant neoplasm of right breast Managed with Herceptin , well-tolerated. No further scans needed for a year. - Continue Herceptin  as tolerated. - Schedule PET scan in one year.       No orders of the defined types were placed in  this encounter.  The patient has a good understanding of the overall plan. she agrees with it. she will call with any problems that may develop before the next visit here. Total time spent: 30 mins including face to face time and time spent for planning, charting and co-ordination of care   Naomi MARLA Chad, MD 06/14/23

## 2023-06-16 ENCOUNTER — Other Ambulatory Visit: Payer: Self-pay

## 2023-07-06 ENCOUNTER — Other Ambulatory Visit: Payer: Self-pay | Admitting: Hematology and Oncology

## 2023-07-13 ENCOUNTER — Other Ambulatory Visit: Payer: Self-pay | Admitting: *Deleted

## 2023-07-13 ENCOUNTER — Inpatient Hospital Stay: Attending: Hematology and Oncology

## 2023-07-13 VITALS — BP 118/78 | HR 58 | Temp 98.3°F | Wt 164.0 lb

## 2023-07-13 DIAGNOSIS — Z5181 Encounter for therapeutic drug level monitoring: Secondary | ICD-10-CM

## 2023-07-13 DIAGNOSIS — Z17410 Hormone receptor positive with human epidermal growth factor receptor 2 positive status: Secondary | ICD-10-CM | POA: Diagnosis not present

## 2023-07-13 DIAGNOSIS — Z79620 Long term (current) use of immunosuppressive biologic: Secondary | ICD-10-CM | POA: Insufficient documentation

## 2023-07-13 DIAGNOSIS — C50511 Malignant neoplasm of lower-outer quadrant of right female breast: Secondary | ICD-10-CM | POA: Diagnosis not present

## 2023-07-13 DIAGNOSIS — Z170 Estrogen receptor positive status [ER+]: Secondary | ICD-10-CM | POA: Insufficient documentation

## 2023-07-13 DIAGNOSIS — Z5112 Encounter for antineoplastic immunotherapy: Secondary | ICD-10-CM | POA: Insufficient documentation

## 2023-07-13 MED ORDER — SODIUM CHLORIDE 0.9% FLUSH
10.0000 mL | INTRAVENOUS | Status: DC | PRN
  Administered 2023-07-13: 10 mL

## 2023-07-13 MED ORDER — HEPARIN SOD (PORK) LOCK FLUSH 100 UNIT/ML IV SOLN
500.0000 [IU] | Freq: Once | INTRAVENOUS | Status: AC | PRN
  Administered 2023-07-13: 500 [IU]

## 2023-07-13 MED ORDER — SODIUM CHLORIDE 0.9 % IV SOLN: Freq: Once | INTRAVENOUS | Status: AC

## 2023-07-13 MED ORDER — ACETAMINOPHEN 325 MG PO TABS: 650.0000 mg | ORAL_TABLET | Freq: Once | ORAL | Status: DC

## 2023-07-13 MED ORDER — TRASTUZUMAB-ANNS CHEMO 150 MG IV SOLR
450.0000 mg | Freq: Once | INTRAVENOUS | Status: AC
  Administered 2023-07-13: 450 mg via INTRAVENOUS
  Filled 2023-07-13: qty 21.43

## 2023-07-13 NOTE — Patient Instructions (Signed)

## 2023-07-13 NOTE — Progress Notes (Signed)
 Per MD okay to treat with echo from 04/11/23.  Verbal orders received and placed to repeat echo prior to next tx.

## 2023-07-13 NOTE — Progress Notes (Signed)
 OK to treat today with Feb ECHO per Dr Lee Public.  Nurse will order new ECHO.

## 2023-07-31 ENCOUNTER — Other Ambulatory Visit: Payer: Self-pay

## 2023-08-10 ENCOUNTER — Inpatient Hospital Stay: Attending: Hematology and Oncology

## 2023-08-10 VITALS — BP 120/60 | HR 64 | Temp 98.4°F | Resp 18 | Wt 166.8 lb

## 2023-08-10 DIAGNOSIS — C50511 Malignant neoplasm of lower-outer quadrant of right female breast: Secondary | ICD-10-CM | POA: Insufficient documentation

## 2023-08-10 DIAGNOSIS — Z17410 Hormone receptor positive with human epidermal growth factor receptor 2 positive status: Secondary | ICD-10-CM | POA: Insufficient documentation

## 2023-08-10 DIAGNOSIS — Z79620 Long term (current) use of immunosuppressive biologic: Secondary | ICD-10-CM | POA: Diagnosis not present

## 2023-08-10 DIAGNOSIS — Z170 Estrogen receptor positive status [ER+]: Secondary | ICD-10-CM | POA: Diagnosis not present

## 2023-08-10 DIAGNOSIS — Z5112 Encounter for antineoplastic immunotherapy: Secondary | ICD-10-CM | POA: Insufficient documentation

## 2023-08-10 DIAGNOSIS — C7951 Secondary malignant neoplasm of bone: Secondary | ICD-10-CM | POA: Insufficient documentation

## 2023-08-10 MED ORDER — TRASTUZUMAB-ANNS CHEMO 150 MG IV SOLR
450.0000 mg | Freq: Once | INTRAVENOUS | Status: AC
  Administered 2023-08-10: 450 mg via INTRAVENOUS
  Filled 2023-08-10: qty 21.43

## 2023-08-10 MED ORDER — SODIUM CHLORIDE 0.9 % IV SOLN: Freq: Once | INTRAVENOUS | Status: AC

## 2023-08-10 MED ORDER — HEPARIN SOD (PORK) LOCK FLUSH 100 UNIT/ML IV SOLN
500.0000 [IU] | Freq: Once | INTRAVENOUS | Status: AC | PRN
  Administered 2023-08-10: 500 [IU]

## 2023-08-10 MED ORDER — ACETAMINOPHEN 325 MG PO TABS
650.0000 mg | ORAL_TABLET | Freq: Once | ORAL | Status: DC
Start: 2023-08-10 — End: 2023-08-10

## 2023-08-10 MED ORDER — SODIUM CHLORIDE 0.9% FLUSH
10.0000 mL | INTRAVENOUS | Status: DC | PRN
  Administered 2023-08-10: 10 mL

## 2023-08-10 NOTE — Patient Instructions (Signed)

## 2023-08-12 ENCOUNTER — Ambulatory Visit (HOSPITAL_COMMUNITY)
Admission: RE | Admit: 2023-08-12 | Discharge: 2023-08-12 | Disposition: A | Discharge status: Home / Self Care | Source: Ambulatory Visit | Attending: Hematology and Oncology | Admitting: Hematology and Oncology

## 2023-08-12 DIAGNOSIS — R001 Bradycardia, unspecified: Secondary | ICD-10-CM | POA: Insufficient documentation

## 2023-08-12 DIAGNOSIS — Z5181 Encounter for therapeutic drug level monitoring: Secondary | ICD-10-CM | POA: Diagnosis not present

## 2023-08-12 DIAGNOSIS — Z79899 Other long term (current) drug therapy: Secondary | ICD-10-CM

## 2023-08-12 LAB — ECHOCARDIOGRAM COMPLETE
Area-P 1/2: 3.46 cm2
S' Lateral: 3 cm
Single Plane A4C EF: 60.8 %

## 2023-08-12 NOTE — Progress Notes (Signed)
 Echocardiogram 2D Echocardiogram has been performed.  Koleen KANDICE Popper, RDCS 08/12/2023, 11:59 AM

## 2023-08-31 ENCOUNTER — Other Ambulatory Visit: Payer: Self-pay | Admitting: Physical Medicine and Rehabilitation

## 2023-08-31 DIAGNOSIS — R102 Pelvic and perineal pain: Secondary | ICD-10-CM

## 2023-09-01 ENCOUNTER — Other Ambulatory Visit: Payer: Self-pay

## 2023-09-06 ENCOUNTER — Inpatient Hospital Stay

## 2023-09-06 ENCOUNTER — Inpatient Hospital Stay: Attending: Hematology and Oncology | Admitting: Hematology and Oncology

## 2023-09-06 VITALS — BP 119/68 | HR 59 | Resp 16

## 2023-09-06 DIAGNOSIS — C50511 Malignant neoplasm of lower-outer quadrant of right female breast: Secondary | ICD-10-CM | POA: Diagnosis not present

## 2023-09-06 DIAGNOSIS — Z79620 Long term (current) use of immunosuppressive biologic: Secondary | ICD-10-CM | POA: Insufficient documentation

## 2023-09-06 DIAGNOSIS — Z5112 Encounter for antineoplastic immunotherapy: Secondary | ICD-10-CM | POA: Diagnosis present

## 2023-09-06 DIAGNOSIS — C7951 Secondary malignant neoplasm of bone: Secondary | ICD-10-CM | POA: Diagnosis not present

## 2023-09-06 DIAGNOSIS — Z17410 Hormone receptor positive with human epidermal growth factor receptor 2 positive status: Secondary | ICD-10-CM | POA: Diagnosis not present

## 2023-09-06 DIAGNOSIS — Z170 Estrogen receptor positive status [ER+]: Secondary | ICD-10-CM | POA: Diagnosis not present

## 2023-09-06 MED ORDER — SODIUM CHLORIDE 0.9 % IV SOLN
Freq: Once | INTRAVENOUS | Status: AC
Start: 2023-09-06 — End: 2023-09-06

## 2023-09-06 MED ORDER — LIDOCAINE-PRILOCAINE 2.5-2.5 % EX CREA: TOPICAL_CREAM | CUTANEOUS | 3 refills | Status: AC | PRN

## 2023-09-06 MED ORDER — HEPARIN SOD (PORK) LOCK FLUSH 100 UNIT/ML IV SOLN
500.0000 [IU] | Freq: Once | INTRAVENOUS | Status: AC | PRN
  Administered 2023-09-06: 500 [IU]

## 2023-09-06 MED ORDER — TRASTUZUMAB-ANNS CHEMO 150 MG IV SOLR
450.0000 mg | Freq: Once | INTRAVENOUS | Status: AC
  Administered 2023-09-06: 450 mg via INTRAVENOUS
  Filled 2023-09-06: qty 21.43

## 2023-09-06 MED ORDER — SODIUM CHLORIDE 0.9% FLUSH
10.0000 mL | INTRAVENOUS | Status: DC | PRN
  Administered 2023-09-06: 10 mL

## 2023-09-06 MED ORDER — ACETAMINOPHEN 325 MG PO TABS
650.0000 mg | ORAL_TABLET | Freq: Once | ORAL | Status: DC
Start: 2023-09-06 — End: 2023-09-06
  Filled 2023-09-06: qty 2

## 2023-09-06 NOTE — Assessment & Plan Note (Addendum)
 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   Toxicities: No adverse effects to Herceptin    Brain: 06/08/2022: Negative Continue with Herceptin  maintenance monthly   Zometa  to every 6 months.   Patient has tried three different medications (Anastrozole , Letrozole , and Exemestane ) with side effects including weight gain and joint pain. Tamoxifen  and Faslodex were discussed as future options.   -Continue Herceptin  indefinitely (echo 04/11/2023: EF 65%): Every 4 weeks -Consider Tamoxifen  at a low dose once the pinched nerve is better   Breast cancer surveillance:  Mammogram 12/08/2022: Benign breast density category C Breast exam 04/19/2023: Benign PET/CT 04/12/2023: No avid tracer uptake for recurrent or metastatic disease.  Similar sclerotic lesions in thoracolumbar spine none tracer avid MRI lumbar spine 06/10/2023: Degenerative changes, disc bulge L4-L5, no metastatic disease

## 2023-09-06 NOTE — Progress Notes (Signed)
 Patient Care Team: [name] as PCP - General (Physician Assistant) Mat Browning, MD as Consulting Physician (Obstetrics and Gynecology) Curvin Deward MOULD, MD as Consulting Physician (General Surgery) Shannon Agent, MD as Consulting Physician (Radiation Oncology) Rolan Ezra RAMAN, MD as Consulting Physician (Cardiology) Odean Potts, MD as Medical Oncologist (Hematology and Oncology)  DIAGNOSIS:  Encounter Diagnosis  Name Primary?   Malignant neoplasm of lower-outer quadrant of right breast of female, estrogen receptor positive (HCC)     SUMMARY OF ONCOLOGIC HISTORY: Oncology History  Malignant neoplasm of lower-outer quadrant of right breast of female, estrogen receptor positive (HCC)  03/22/2016 Surgery   left lumpectomy 03/22/2016 showing atypical lobular hyperplasia prophylactic tamoxifen  taken from 04/2016 through 03/2019   06/06/2019 Genetic Testing   Negative genetic testing:  No pathogenic variants detected on the [facility] panel, ordered by Dr. Mat at [facility]. Two variants of uncertain significance (VUS) were detected - one in the BRIP1 gene called c.2863A>C and a second in the NBN gene called c.643C>T. The report date is 06/06/2019.  The [facility] gene panel offered by [employer] includes sequencing and deletion/duplication testing of the following 35 genes: APC, ATM, AXIN2, BARD1, BMPR1A, BRCA1, BRCA2, BRIP1, CDH1, CDK4, CDKN2A, CHEK2, EPCAM (large rearrangement only), HOXB13 (sequencing only), GALNT12, MLH1, MSH2, MSH3 (excluding repetitive portions of exon 1), MSH6, MUTYH, NBN, NTHL1, PALB2, PMS2, PTEN, RAD51C, RAD51D, RNF43, RPS20, SMAD4, STK11, and TP53. Sequencing was performed for select regions of POLE and POLD1, and large rearrangement analysis was performed for select regions of GREM1.     06/25/2020 Relapse/Recurrence   right breast lower outer quadrant biopsy 06/25/2020 shows a clinical T2 N1, stage IB  invasive ductal carcinoma, grade 2 or 3, triple positive, with an MIB-1 of 40%   07/02/2020 Cancer Staging   Staging form: Breast, AJCC 8th Edition - Clinical stage from 07/02/2020: Stage IV (cT2, cN1, cM1, G3, ER+, PR+, HER2+) - Signed by Layla Sandria BROCKS, MD on 08/04/2020 Stage prefix: Initial diagnosis Histologic grading system: 3 grade system Laterality: Right Staged by: Pathologist and managing physician Stage used in treatment planning: Yes National guidelines used in treatment planning: Yes Type of national guideline used in treatment planning: NCCN   07/16/2020 - 09/30/2020 Chemotherapy   neoadjuvant chemotherapy consisting of docetaxel , carboplatin , trastuzumab  and pertuzumab  every 21 days x 4 cycles started 07/28/2020, last chemotherapy 09/30/2020       08/01/2020 PET scan   PET scan 08/01/2020 shows no hypermetabolism in the 1.2 cm right liver lesion; bony hypermetabolism consistent with known mets   08/19/2020 - 10/21/2020 Chemotherapy   Patient is on Treatment Plan : BREAST  Trastuzumab  + Pertuzumab  q21d      10/07/2020 Imaging    total spinal MRI 10/07/2020 shows stable to improved disease, no extraosseous extension, pathologic fracture, epidural or intracanalicular involvement MRI of the breast 10/19/2020 shows a complete imaging response in the breast, with new ring-enhancing lesions in the left breast with MRI guided biopsy 11/10/2020 showing no evidence of malignancy   11/11/2020 - 01/18/2022 Chemotherapy   Patient is on Treatment Plan : BREAST Trastuzumab  + Pertuzumab  q21d     11/11/2020 - 04/12/2022 Chemotherapy   Patient is on Treatment Plan : BREAST Trastuzumab  IV (8/6) or SQ (600) D1 q21d     01/02/2021 Surgery   right lumpectomy with no sentinel lymph node sampling 01/02/2021 showed scattered foci of residual invasive ductal carcinoma, grade 2, the largest measuring 0.8 cm.  Margins were negative  01/02/2021 -  Anti-estrogen oral therapy   anastrozole  started  01/12/2021   05/11/2022 -  Chemotherapy   Patient is on Treatment Plan : BREAST MAINTENANCE Trastuzumab  IV (6) or SQ (600) D1 q28d x 13 cycles     Malignant neoplasm metastatic to bone (HCC)  08/04/2020 Initial Diagnosis   Bone metastases (HCC)   11/11/2020 - 01/18/2022 Chemotherapy   Patient is on Treatment Plan : BREAST Trastuzumab  + Pertuzumab  q21d     11/11/2020 - 04/12/2022 Chemotherapy   Patient is on Treatment Plan : BREAST Trastuzumab  IV (8/6) or SQ (600) D1 q21d       CHIEF COMPLIANT: Follow-up on Herceptin   HISTORY OF PRESENT ILLNESS: History of Present Illness Kim Archer is a 58 year old female with metastatic breast cancer who presents for follow-up on her ongoing cancer treatment and management.  She is undergoing treatment with Herceptin  for HER2 positive, estrogen receptor-positive breast cancer. Her echocardiograms are current, with the last one performed on June 27. She has metastatic breast cancer to the bone, with the last PET scan conducted in February, and she follows an annual schedule for these scans.  She experiences ongoing issues with a pinched nerve, with further diagnostic tests planned, including a nerve conduction test and MRI of the pelvis. She requests a refill for lidocaine  for pain management.     ALLERGIES:  is allergic to ciprofloxacin, ciprofibrate, gabapentin , sulfa antibiotics, and venlafaxine .  MEDICATIONS:  Current Outpatient Medications  Medication Sig Dispense Refill   Biotin  5 MG CAPS Take 1 capsule (5 mg total) by mouth daily.  0   Boron 3 MG CAPS Take by mouth.     Calcium 200 MG TABS      Calcium 500 MG tablet Take 600 mg by mouth.     Cholecalciferol (VITAMIN D) 10 MCG/ML LIQD      ipratropium (ATROVENT ) 0.06 % nasal spray Place 1 spray into both nostrils 3 (three) times daily. 15 mL 3   lidocaine -prilocaine  (EMLA ) cream Apply topically continuous as needed. 30 g 1   LUTEIN PO Take by mouth.     meloxicam (MOBIC) 7.5 MG tablet  Take 7.5 mg by mouth daily. 1@ night     Menatetrenone (VITAMIN K2) 100 MCG TABS Take by mouth.     NON FORMULARY Semaglutide 2.5 mg/Pyridoxine 10 mg inject 20 units subcutaneously weekly     OVER THE COUNTER MEDICATION Take 1 capsule by mouth daily. [employer] Mushroom 1200mg      Pediatric Multivitamins-Fl (MULTIVITAMIN + FLUORIDE) 0.25 MG CHEW      tiZANidine (ZANAFLEX) 4 MG capsule Take 4 mg by mouth 3 (three) times daily. 1@ night     Ubiquinol 50 MG CAPS Take by mouth daily.     valACYclovir  (VALTREX ) 500 MG tablet Take 500 mg by mouth 2 (two) times daily as needed. (Patient not taking: Reported on 06/14/2023)     VITAMIN D PO 50 mcg.     No current facility-administered medications for this visit.    PHYSICAL EXAMINATION: ECOG PERFORMANCE STATUS: 1 - Symptomatic but completely ambulatory  Vitals:   09/06/23 1452  BP: 120/60  Pulse: 60  Resp: 17  Temp: 97.6 F (36.4 C)  SpO2: 100%   Filed Weights   09/06/23 1452  Weight: 172 lb (78 kg)     LABORATORY DATA:  I have reviewed the data as listed    Latest Ref Rng & Units 05/17/2023    2:31 PM 11/23/2022    3:10  PM 06/07/2022    2:28 PM  CMP  Glucose 70 - 99 mg/dL 887  894  93   BUN 6 - 20 mg/dL 16  17  15    Creatinine 0.44 - 1.00 mg/dL 9.15  9.00  9.17   Sodium 135 - 145 mmol/L 142  143  141   Potassium 3.5 - 5.1 mmol/L 4.0  4.1  4.2   Chloride 98 - 111 mmol/L 108  106  105   CO2 22 - 32 mmol/L 27  31  29    Calcium 8.9 - 10.3 mg/dL 9.6  89.6  89.9   Total Protein 6.5 - 8.1 g/dL 7.2  7.5  7.4   Total Bilirubin 0.0 - 1.2 mg/dL 0.4  0.4  0.4   Alkaline Phos 38 - 126 U/L 70  95  105   AST 15 - 41 U/L 22  19  19    ALT 0 - 44 U/L 37  20  20     Lab Results  Component Value Date   WBC 6.2 05/17/2023   HGB 12.4 05/17/2023   HCT 37.5 05/17/2023   MCV 94.0 05/17/2023   PLT 244 05/17/2023   NEUTROABS 3.6 05/17/2023    ASSESSMENT & PLAN:  Malignant neoplasm of lower-outer quadrant of right breast of female, estrogen  receptor positive (HCC) 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   Toxicities: No adverse effects to Herceptin    Brain: 06/08/2022: Negative Continue with Herceptin  maintenance monthly   Zometa  to every 6 months.   Patient has tried three different medications (Anastrozole , Letrozole , and Exemestane ) with side effects including weight gain and joint pain. Tamoxifen  and Faslodex were discussed as future options.   -Continue Herceptin  indefinitely (echo 04/11/2023: EF 65%): Every 4 weeks -Consider Tamoxifen  at a low dose once the pinched nerve is better   Breast cancer surveillance:  Mammogram 12/08/2022: Benign breast density category C Breast exam 04/19/2023: Benign PET/CT 04/12/2023: No avid tracer uptake for recurrent or metastatic disease.  Similar sclerotic lesions in thoracolumbar spine none tracer avid MRI lumbar spine 06/10/2023: Degenerative changes, disc bulge L4-L5, no metastatic disease Echocardiogram will be every 6 months   ------------------------------------- Assessment and Plan Assessment & Plan Malignant neoplasm of lower-outer quadrant of right breast, estrogen receptor positive Continuing Herceptin  treatment without issues. Echocardiograms normal, frequency adjusted to every six months. PET scan scheduled annually. - Continue Herceptin  treatment. - Change echocardiogram frequency to every six months. - Schedule PET scan annually.  Lumbar facet arthritis Reports pinched nerve, further evaluation with conduction test and MRI planned. - Order conduction test. - Order MRI.  Medication management Requires refill of lidocaine -prilocaine  (emla ) topical for pain management. - Refill lidocaine -prilocaine  (emla ) topical prescription at [facility].      No orders of the  defined types were placed in this encounter.  The patient has a good understanding of the overall plan. she agrees with it. she will call with any problems that may develop before the next visit here. Total time spent: 30 mins including face to face time and time spent for planning, charting and co-ordination of care   Naomi MARLA Chad, MD 09/06/23

## 2023-09-08 ENCOUNTER — Other Ambulatory Visit: Payer: Self-pay

## 2023-09-12 ENCOUNTER — Encounter: Payer: Self-pay | Admitting: Physical Medicine and Rehabilitation

## 2023-09-12 ENCOUNTER — Other Ambulatory Visit: Payer: Self-pay

## 2023-09-15 ENCOUNTER — Ambulatory Visit
Admission: RE | Admit: 2023-09-15 | Discharge: 2023-09-15 | Disposition: A | Discharge status: Home / Self Care | Source: Ambulatory Visit | Attending: Physical Medicine and Rehabilitation | Admitting: Physical Medicine and Rehabilitation

## 2023-09-15 DIAGNOSIS — R102 Pelvic and perineal pain: Secondary | ICD-10-CM

## 2023-10-04 ENCOUNTER — Inpatient Hospital Stay (HOSPITAL_BASED_OUTPATIENT_CLINIC_OR_DEPARTMENT_OTHER): Admitting: Hematology and Oncology

## 2023-10-04 ENCOUNTER — Inpatient Hospital Stay: Attending: Hematology and Oncology

## 2023-10-04 VITALS — BP 114/63 | HR 63 | Temp 98.5°F | Resp 13 | Wt 174.4 lb

## 2023-10-04 DIAGNOSIS — Z5112 Encounter for antineoplastic immunotherapy: Secondary | ICD-10-CM | POA: Insufficient documentation

## 2023-10-04 DIAGNOSIS — Z17410 Hormone receptor positive with human epidermal growth factor receptor 2 positive status: Secondary | ICD-10-CM | POA: Insufficient documentation

## 2023-10-04 DIAGNOSIS — Z79620 Long term (current) use of immunosuppressive biologic: Secondary | ICD-10-CM | POA: Insufficient documentation

## 2023-10-04 DIAGNOSIS — C50511 Malignant neoplasm of lower-outer quadrant of right female breast: Secondary | ICD-10-CM

## 2023-10-04 DIAGNOSIS — Z170 Estrogen receptor positive status [ER+]: Secondary | ICD-10-CM

## 2023-10-04 DIAGNOSIS — C7951 Secondary malignant neoplasm of bone: Secondary | ICD-10-CM | POA: Diagnosis not present

## 2023-10-04 DIAGNOSIS — Z79811 Long term (current) use of aromatase inhibitors: Secondary | ICD-10-CM | POA: Insufficient documentation

## 2023-10-04 MED ORDER — TRASTUZUMAB-ANNS CHEMO 150 MG IV SOLR
450.0000 mg | Freq: Once | INTRAVENOUS | Status: AC
  Administered 2023-10-04: 450 mg via INTRAVENOUS
  Filled 2023-10-04: qty 21.43

## 2023-10-04 MED ORDER — SODIUM CHLORIDE 0.9% FLUSH: 10.0000 mL | INTRAVENOUS | Status: DC | PRN

## 2023-10-04 MED ORDER — SODIUM CHLORIDE 0.9 % IV SOLN
Freq: Once | INTRAVENOUS | Status: AC
Start: 2023-10-04 — End: 2023-10-04

## 2023-10-04 NOTE — Progress Notes (Signed)
 Patient already took her tylenol  at home.

## 2023-10-04 NOTE — Progress Notes (Signed)
 Patient Care Team: [name] as PCP - General (Physician Assistant) Mat Browning, MD as Consulting Physician (Obstetrics and Gynecology) Curvin Deward MOULD, MD as Consulting Physician (General Surgery) Shannon Agent, MD as Consulting Physician (Radiation Oncology) Rolan Ezra RAMAN, MD as Consulting Physician (Cardiology) Odean Potts, MD as Medical Oncologist (Hematology and Oncology)  DIAGNOSIS:  No diagnosis found.   SUMMARY OF ONCOLOGIC HISTORY: Oncology History  Malignant neoplasm of lower-outer quadrant of right breast of female, estrogen receptor positive (HCC)  03/22/2016 Surgery   left lumpectomy 03/22/2016 showing atypical lobular hyperplasia prophylactic tamoxifen  taken from 04/2016 through 03/2019   06/06/2019 Genetic Testing   Negative genetic testing:  No pathogenic variants detected on the [facility] panel, ordered by Dr. Mat at [facility]. Two variants of uncertain significance (VUS) were detected - one in the BRIP1 gene called c.2863A>C and a second in the NBN gene called c.643C>T. The report date is 06/06/2019.  The [facility] gene panel offered by [employer] includes sequencing and deletion/duplication testing of the following 35 genes: APC, ATM, AXIN2, BARD1, BMPR1A, BRCA1, BRCA2, BRIP1, CDH1, CDK4, CDKN2A, CHEK2, EPCAM (large rearrangement only), HOXB13 (sequencing only), GALNT12, MLH1, MSH2, MSH3 (excluding repetitive portions of exon 1), MSH6, MUTYH, NBN, NTHL1, PALB2, PMS2, PTEN, RAD51C, RAD51D, RNF43, RPS20, SMAD4, STK11, and TP53. Sequencing was performed for select regions of POLE and POLD1, and large rearrangement analysis was performed for select regions of GREM1.     06/25/2020 Relapse/Recurrence   right breast lower outer quadrant biopsy 06/25/2020 shows a clinical T2 N1, stage IB invasive ductal carcinoma, grade 2 or 3, triple positive, with an MIB-1 of 40%   07/02/2020 Cancer Staging   Staging form:  Breast, AJCC 8th Edition - Clinical stage from 07/02/2020: Stage IV (cT2, cN1, cM1, G3, ER+, PR+, HER2+) - Signed by Layla Sandria BROCKS, MD on 08/04/2020 Stage prefix: Initial diagnosis Histologic grading system: 3 grade system Laterality: Right Staged by: Pathologist and managing physician Stage used in treatment planning: Yes National guidelines used in treatment planning: Yes Type of national guideline used in treatment planning: NCCN   07/16/2020 - 09/30/2020 Chemotherapy   neoadjuvant chemotherapy consisting of docetaxel , carboplatin , trastuzumab  and pertuzumab  every 21 days x 4 cycles started 07/28/2020, last chemotherapy 09/30/2020       08/01/2020 PET scan   PET scan 08/01/2020 shows no hypermetabolism in the 1.2 cm right liver lesion; bony hypermetabolism consistent with known mets   08/19/2020 - 10/21/2020 Chemotherapy   Patient is on Treatment Plan : BREAST  Trastuzumab  + Pertuzumab  q21d      10/07/2020 Imaging    total spinal MRI 10/07/2020 shows stable to improved disease, no extraosseous extension, pathologic fracture, epidural or intracanalicular involvement MRI of the breast 10/19/2020 shows a complete imaging response in the breast, with new ring-enhancing lesions in the left breast with MRI guided biopsy 11/10/2020 showing no evidence of malignancy   11/11/2020 - 01/18/2022 Chemotherapy   Patient is on Treatment Plan : BREAST Trastuzumab  + Pertuzumab  q21d     11/11/2020 - 04/12/2022 Chemotherapy   Patient is on Treatment Plan : BREAST Trastuzumab  IV (8/6) or SQ (600) D1 q21d     01/02/2021 Surgery   right lumpectomy with no sentinel lymph node sampling 01/02/2021 showed scattered foci of residual invasive ductal carcinoma, grade 2, the largest measuring 0.8 cm.  Margins were negative   01/02/2021 -  Anti-estrogen oral therapy   anastrozole  started 01/12/2021   05/11/2022 -  Chemotherapy  Patient is on Treatment Plan : BREAST MAINTENANCE Trastuzumab  IV (6) or SQ (600) D1 q28d  x 13 cycles     Malignant neoplasm metastatic to bone (HCC)  08/04/2020 Initial Diagnosis   Bone metastases (HCC)   11/11/2020 - 01/18/2022 Chemotherapy   Patient is on Treatment Plan : BREAST Trastuzumab  + Pertuzumab  q21d     11/11/2020 - 04/12/2022 Chemotherapy   Patient is on Treatment Plan : BREAST Trastuzumab  IV (8/6) or SQ (600) D1 q21d       CHIEF COMPLIANT: Follow-up on Herceptin   HISTORY OF PRESENT ILLNESS: History of Present Illness  Kim Archer is a 58 year old female with metastatic breast cancer who presents for follow-up on Herceptin  treatment.  She has a history of metastatic breast cancer and is currently on Herceptin  treatment. She is not taking anastrozole , letrozole , or exemestane . She undergoes scans annually, with the last scan in February, no evidence of breast cancer recurrence.  She experiences back pain due to a pinched nerve at the L5 area. She has received injections in the SI joint, L5, and piriformis muscle, with another injection scheduled. Previous injections have not provided significant relief, but she notes some slow improvement. Mornings are particularly difficult due to stiffness.  Her current medications include Herceptin , Zometa  every 12 weeks, and Zanaflex at night as needed.SABRA  Rest of the pertinent 10 point ROS reviewed and neg.     ALLERGIES:  is allergic to ciprofloxacin, ciprofibrate, gabapentin , sulfa antibiotics, and venlafaxine .  MEDICATIONS:  Current Outpatient Medications  Medication Sig Dispense Refill   Biotin  5 MG CAPS Take 1 capsule (5 mg total) by mouth daily.  0   Boron 3 MG CAPS Take by mouth.     Calcium 200 MG TABS      Calcium 500 MG tablet Take 600 mg by mouth.     Cholecalciferol (VITAMIN D) 10 MCG/ML LIQD      ipratropium (ATROVENT ) 0.06 % nasal spray Place 1 spray into both nostrils 3 (three) times daily. 15 mL 3   lidocaine -prilocaine  (EMLA ) cream Apply topically continuous as needed. 30 g 3   LUTEIN PO Take by  mouth.     meloxicam (MOBIC) 7.5 MG tablet Take 7.5 mg by mouth daily. 1@ night     Menatetrenone (VITAMIN K2) 100 MCG TABS Take by mouth.     OVER THE COUNTER MEDICATION Take 1 capsule by mouth daily. [employer] Mushroom 1200mg      Pediatric Multivitamins-Fl (MULTIVITAMIN + FLUORIDE) 0.25 MG CHEW      tiZANidine (ZANAFLEX) 4 MG capsule Take 4 mg by mouth 3 (three) times daily. 1@ night (Patient taking differently: Take 4 mg by mouth at bedtime. 1@ night)     Ubiquinol 50 MG CAPS Take by mouth daily.     valACYclovir  (VALTREX ) 500 MG tablet Take 500 mg by mouth 2 (two) times daily as needed.     VITAMIN D PO 50 mcg.     NON FORMULARY Semaglutide 2.5 mg/Pyridoxine 10 mg inject 20 units subcutaneously weekly (Patient not taking: Reported on 10/04/2023)     No current facility-administered medications for this visit.    PHYSICAL EXAMINATION: ECOG PERFORMANCE STATUS: 1 - Symptomatic but completely ambulatory  Vitals:   10/04/23 1456  BP: 114/63  Pulse: 63  Resp: 13  Temp: 98.5 F (36.9 C)  SpO2: 100%   Filed Weights   10/04/23 1456  Weight: 174 lb 6.4 oz (79.1 kg)   Physical Exam Constitutional:  Appearance: Normal appearance.  Cardiovascular:     Rate and Rhythm: Normal rate and regular rhythm.     Pulses: Normal pulses.     Heart sounds: Normal heart sounds.  Pulmonary:     Effort: Pulmonary effort is normal.     Breath sounds: Normal breath sounds.  Musculoskeletal:        General: Normal range of motion.     Cervical back: Normal range of motion and neck supple. No rigidity.  Lymphadenopathy:     Cervical: No cervical adenopathy.  Skin:    General: Skin is warm and dry.  Neurological:     General: No focal deficit present.     Mental Status: She is alert.  Psychiatric:        Mood and Affect: Mood normal.    LABORATORY DATA:  I have reviewed the data as listed    Latest Ref Rng & Units 05/17/2023    2:31 PM 11/23/2022    3:10 PM 06/07/2022    2:28 PM  CMP   Glucose 70 - 99 mg/dL 887  894  93   BUN 6 - 20 mg/dL 16  17  15    Creatinine 0.44 - 1.00 mg/dL 9.15  9.00  9.17   Sodium 135 - 145 mmol/L 142  143  141   Potassium 3.5 - 5.1 mmol/L 4.0  4.1  4.2   Chloride 98 - 111 mmol/L 108  106  105   CO2 22 - 32 mmol/L 27  31  29    Calcium 8.9 - 10.3 mg/dL 9.6  89.6  89.9   Total Protein 6.5 - 8.1 g/dL 7.2  7.5  7.4   Total Bilirubin 0.0 - 1.2 mg/dL 0.4  0.4  0.4   Alkaline Phos 38 - 126 U/L 70  95  105   AST 15 - 41 U/L 22  19  19    ALT 0 - 44 U/L 37  20  20     Lab Results  Component Value Date   WBC 6.2 05/17/2023   HGB 12.4 05/17/2023   HCT 37.5 05/17/2023   MCV 94.0 05/17/2023   PLT 244 05/17/2023   NEUTROABS 3.6 05/17/2023    ASSESSMENT & PLAN:  Malignant neoplasm of 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   Patient has tried three different medications (Anastrozole , Letrozole , and Exemestane ) with side effects including weight gain and joint pain. Tamoxifen  and Faslodex were discussed as future options.   -Continue Herceptin  indefinitely (echo 04/11/2023: EF 65%): Every 4 weeks -Consider Tamoxifen  at a low dose once the pinched nerve is better. She is still dealing with this.   Breast cancer surveillance:  Mammogram 12/08/2022: Benign breast density category C Breast exam 04/19/2023: Benign PET/CT 04/12/2023: No avid tracer uptake for recurrent or metastatic disease.  Similar sclerotic lesions in thoracolumbar spine none tracer avid MRI lumbar spine 06/10/2023: Degenerative changes, disc bulge L4-L5, no metastatic disease Echocardiogram will be every 6 months   ------------------------------------- Assessment and Plan Assessment &  Plan   Metastatic breast cancer on Herceptin  therapy Metastatic breast cancer stable on long-term Herceptin  therapy  Echocardiogram stable. No new dental issues on Zometa . - Continue Herceptin  therapy indefinitely. - Schedule  annual scans to monitor cancer status. - Monitor for any new dental issues while on Zometa .  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 Previous injections ineffective. Scheduled for another injection.  - Administer scheduled injection for L5 area. - Continue to monitor - No evidence of metastatic disease.   No orders of the defined types were placed in this encounter.  The patient has a good understanding of the overall plan. she agrees with it. she will call with any problems that may develop before the next visit here. Total time spent: 30 mins including face to face time and time spent for planning, charting and co-ordination of care   Amber Stalls, MD 10/04/23

## 2023-10-05 ENCOUNTER — Ambulatory Visit

## 2023-10-05 ENCOUNTER — Ambulatory Visit: Admitting: Hematology and Oncology

## 2023-10-31 ENCOUNTER — Telehealth: Payer: Self-pay | Admitting: Hematology and Oncology

## 2023-10-31 NOTE — Telephone Encounter (Signed)
 left vm for pt about appointment time added

## 2023-11-01 ENCOUNTER — Inpatient Hospital Stay

## 2023-11-01 ENCOUNTER — Inpatient Hospital Stay: Attending: Hematology and Oncology

## 2023-11-01 VITALS — BP 133/76 | HR 50 | Temp 98.7°F | Resp 16 | Ht 67.0 in | Wt 176.0 lb

## 2023-11-01 DIAGNOSIS — C50511 Malignant neoplasm of lower-outer quadrant of right female breast: Secondary | ICD-10-CM | POA: Insufficient documentation

## 2023-11-01 DIAGNOSIS — Z17410 Hormone receptor positive with human epidermal growth factor receptor 2 positive status: Secondary | ICD-10-CM | POA: Diagnosis not present

## 2023-11-01 DIAGNOSIS — Z5112 Encounter for antineoplastic immunotherapy: Secondary | ICD-10-CM | POA: Insufficient documentation

## 2023-11-01 DIAGNOSIS — M899 Disorder of bone, unspecified: Secondary | ICD-10-CM | POA: Diagnosis present

## 2023-11-01 DIAGNOSIS — Z7189 Other specified counseling: Secondary | ICD-10-CM

## 2023-11-01 DIAGNOSIS — Z95828 Presence of other vascular implants and grafts: Secondary | ICD-10-CM

## 2023-11-01 DIAGNOSIS — Z79620 Long term (current) use of immunosuppressive biologic: Secondary | ICD-10-CM | POA: Insufficient documentation

## 2023-11-01 LAB — CMP (CANCER CENTER ONLY)
ALT: 27 U/L (ref 0–44)
AST: 20 U/L (ref 15–41)
Albumin: 4.5 g/dL (ref 3.5–5.0)
Alkaline Phosphatase: 83 U/L (ref 38–126)
Anion gap: 4 — ABNORMAL LOW (ref 5–15)
BUN: 15 mg/dL (ref 6–20)
CO2: 29 mmol/L (ref 22–32)
Calcium: 9.5 mg/dL (ref 8.9–10.3)
Chloride: 108 mmol/L (ref 98–111)
Creatinine: 0.8 mg/dL (ref 0.44–1.00)
GFR, Estimated: >60 mL/min (ref >60)
Glucose, Bld: 81 mg/dL (ref 70–99)
Potassium: 4.2 mmol/L (ref 3.5–5.1)
Sodium: 141 mmol/L (ref 135–145)
Total Bilirubin: 0.3 mg/dL (ref 0.0–1.2)
Total Protein: 7.2 g/dL (ref 6.5–8.1)

## 2023-11-01 LAB — CBC WITH DIFFERENTIAL (CANCER CENTER ONLY)
Abs Immature Granulocytes: 0.02 K/uL (ref 0.00–0.07)
Basophils Absolute: 0 K/uL (ref 0.0–0.1)
Basophils Relative: 1 %
Eosinophils Absolute: 0.1 K/uL (ref 0.0–0.5)
Eosinophils Relative: 2 %
HCT: 40 % (ref 36.0–46.0)
Hemoglobin: 13.5 g/dL (ref 12.0–15.0)
Immature Granulocytes: 0 %
Lymphocytes Relative: 42 %
Lymphs Abs: 2.7 K/uL (ref 0.7–4.0)
MCH: 31 pg (ref 26.0–34.0)
MCHC: 33.8 g/dL (ref 30.0–36.0)
MCV: 91.7 fL (ref 80.0–100.0)
Monocytes Absolute: 0.4 K/uL (ref 0.1–1.0)
Monocytes Relative: 7 %
Neutro Abs: 3 K/uL (ref 1.7–7.7)
Neutrophils Relative %: 48 %
Platelet Count: 241 K/uL (ref 150–400)
RBC: 4.36 MIL/uL (ref 3.87–5.11)
RDW: 11.9 % (ref 11.5–15.5)
WBC Count: 6.3 K/uL (ref 4.0–10.5)
nRBC: 0 % (ref 0.0–0.2)

## 2023-11-01 LAB — MAGNESIUM: Magnesium: 2.4 mg/dL (ref 1.7–2.4)

## 2023-11-01 MED ORDER — ACETAMINOPHEN 325 MG PO TABS: 650.0000 mg | ORAL_TABLET | Freq: Once | ORAL | Status: DC

## 2023-11-01 MED ORDER — SODIUM CHLORIDE 0.9 % IV SOLN: Freq: Once | INTRAVENOUS | Status: AC

## 2023-11-01 MED ORDER — TRASTUZUMAB-ANNS CHEMO 150 MG IV SOLR
450.0000 mg | Freq: Once | INTRAVENOUS | Status: AC
  Administered 2023-11-01: 450 mg via INTRAVENOUS
  Filled 2023-11-01: qty 21.43

## 2023-11-01 MED ORDER — ZOLEDRONIC ACID 4 MG/100ML IV SOLN
4.0000 mg | Freq: Once | INTRAVENOUS | Status: AC
  Administered 2023-11-01: 4 mg via INTRAVENOUS
  Filled 2023-11-01: qty 100

## 2023-11-01 NOTE — Patient Instructions (Signed)
 [facility] - A DEPT OF [facility]  Discharge Instructions: Thank you for choosing [facility] to provide your oncology and hematology care.   If you have a lab appointment with the Cancer Center, please go directly to the Cancer Center and check in at the registration area.   Wear comfortable clothing and clothing appropriate for easy access to any Portacath or PICC line.   We strive to give you quality time with your provider. You may need to reschedule your appointment if you arrive late (15 or more minutes).  Arriving late affects you and other patients whose appointments are after yours.  Also, if you miss three or more appointments without notifying the office, you may be dismissed from the clinic at the provider's discretion.      For prescription refill requests, have your pharmacy contact our office and allow 72 hours for refills to be completed.    Today you received the following chemotherapy and/or immunotherapy agents: Trastuzumab ; Zometa    To help prevent nausea and vomiting after your treatment, we encourage you to take your nausea medication as directed.  BELOW ARE SYMPTOMS THAT SHOULD BE REPORTED IMMEDIATELY: *FEVER GREATER THAN 100.4 F (38 C) OR HIGHER *CHILLS OR SWEATING *NAUSEA AND VOMITING THAT IS NOT CONTROLLED WITH YOUR NAUSEA MEDICATION *UNUSUAL SHORTNESS OF BREATH *UNUSUAL BRUISING OR BLEEDING *URINARY PROBLEMS (pain or burning when urinating, or frequent urination) *BOWEL PROBLEMS (unusual diarrhea, constipation, pain near the anus) TENDERNESS IN MOUTH AND THROAT WITH OR WITHOUT PRESENCE OF ULCERS (sore throat, sores in mouth, or a toothache) UNUSUAL RASH, SWELLING OR PAIN  UNUSUAL VAGINAL DISCHARGE OR ITCHING   Items with * indicate a potential emergency and should be followed up as soon as possible or go to the Emergency Department if any problems should occur.  Please show the CHEMOTHERAPY ALERT CARD or  IMMUNOTHERAPY ALERT CARD at check-in to the Emergency Department and triage nurse.  Should you have questions after your visit or need to cancel or reschedule your appointment, please contact [facility] - A DEPT OF [facility]  Dept: [phone]  and follow the prompts.  Office hours are 8:00 a.m. to 4:30 p.m. Monday - Friday. Please note that voicemails left after 4:00 p.m. may not be returned until the following business day.  We are closed weekends and major holidays. You have access to a nurse at all times for urgent questions. Please call the main number to the clinic Dept: [phone] and follow the prompts.   For any non-urgent questions, you may also contact your provider using MyChart. We now offer e-Visits for anyone 46 and older to request care online for non-urgent symptoms. For details visit [email or web address].   Also download the MyChart app! Go to the app store, search MyChart, open the app, select , and log in with your MyChart username and password.

## 2023-11-07 ENCOUNTER — Other Ambulatory Visit: Payer: Self-pay | Admitting: Hematology and Oncology

## 2023-11-29 ENCOUNTER — Inpatient Hospital Stay: Attending: Hematology and Oncology

## 2023-11-29 ENCOUNTER — Other Ambulatory Visit: Payer: Self-pay | Admitting: Hematology and Oncology

## 2023-11-29 ENCOUNTER — Inpatient Hospital Stay: Attending: Hematology and Oncology | Admitting: Hematology and Oncology

## 2023-11-29 VITALS — BP 133/63 | HR 55 | Temp 97.7°F | Resp 16 | Wt 179.0 lb

## 2023-11-29 DIAGNOSIS — Z79620 Long term (current) use of immunosuppressive biologic: Secondary | ICD-10-CM | POA: Insufficient documentation

## 2023-11-29 DIAGNOSIS — Z170 Estrogen receptor positive status [ER+]: Secondary | ICD-10-CM

## 2023-11-29 DIAGNOSIS — Z17410 Hormone receptor positive with human epidermal growth factor receptor 2 positive status: Secondary | ICD-10-CM | POA: Diagnosis not present

## 2023-11-29 DIAGNOSIS — Z79811 Long term (current) use of aromatase inhibitors: Secondary | ICD-10-CM | POA: Insufficient documentation

## 2023-11-29 DIAGNOSIS — C50511 Malignant neoplasm of lower-outer quadrant of right female breast: Secondary | ICD-10-CM | POA: Insufficient documentation

## 2023-11-29 DIAGNOSIS — Z5112 Encounter for antineoplastic immunotherapy: Secondary | ICD-10-CM | POA: Diagnosis present

## 2023-11-29 DIAGNOSIS — Z9889 Other specified postprocedural states: Secondary | ICD-10-CM

## 2023-11-29 DIAGNOSIS — C7951 Secondary malignant neoplasm of bone: Secondary | ICD-10-CM | POA: Diagnosis not present

## 2023-11-29 MED ORDER — TRASTUZUMAB-ANNS CHEMO 150 MG IV SOLR
450.0000 mg | Freq: Once | INTRAVENOUS | Status: AC
  Administered 2023-11-29: 450 mg via INTRAVENOUS
  Filled 2023-11-29: qty 21.43

## 2023-11-29 MED ORDER — SODIUM CHLORIDE 0.9 % IV SOLN: Freq: Once | INTRAVENOUS | Status: AC

## 2023-11-29 MED ORDER — ACETAMINOPHEN 325 MG PO TABS: 650.0000 mg | ORAL_TABLET | Freq: Once | ORAL | Status: DC

## 2023-11-29 NOTE — Patient Instructions (Signed)

## 2023-11-29 NOTE — Assessment & Plan Note (Signed)
 Metastatic Breast cancer ER/PR and Her 2 Positive: S/p NAC with TCHP and BCS   Current Treatment: Herceptin     Toxicities: No adverse effects to Herceptin    Brain: 06/08/2022: Negative Continue with Herceptin  maintenance monthly   Zometa  to every 6 months.   Patient has tried three different medications (Anastrozole , Letrozole , and Exemestane ) with side effects including weight gain and joint pain. Tamoxifen  and Faslodex were discussed as future options.   -Continue Herceptin  indefinitely (echo 04/11/2023: EF 65%): Every 4 weeks -Consider Tamoxifen  at a low dose once the pinched nerve is better. She is still dealing with this.   Breast cancer surveillance:  Mammogram 12/08/2022: Benign breast density category C Breast exam 04/19/2023: Benign PET/CT 04/12/2023: No avid tracer uptake for recurrent or metastatic disease.  Similar sclerotic lesions in thoracolumbar spine none tracer avid MRI lumbar spine 06/10/2023: Degenerative changes, disc bulge L4-L5, no metastatic disease MRI pelvis 09/23/2023: Benign  Echocardiogram will be every 6 months Plan to obtain PET CT scan February 2026

## 2023-11-29 NOTE — Progress Notes (Signed)
 Patient Care Team: [name] as PCP - General (Physician Assistant) Mat Browning, MD as Consulting Physician (Obstetrics and Gynecology) Curvin Deward MOULD, MD as Consulting Physician (General Surgery) Shannon Agent, MD as Consulting Physician (Radiation Oncology) Rolan Ezra RAMAN, MD as Consulting Physician (Cardiology) Odean Potts, MD as Medical Oncologist (Hematology and Oncology)  DIAGNOSIS:  Encounter Diagnosis  Name Primary?   Malignant neoplasm of lower-outer quadrant of right breast of female, estrogen receptor positive (HCC) Yes    SUMMARY OF ONCOLOGIC HISTORY: Oncology History  Malignant neoplasm of lower-outer quadrant of right breast of female, estrogen receptor positive (HCC)  03/22/2016 Surgery   left lumpectomy 03/22/2016 showing atypical lobular hyperplasia prophylactic tamoxifen  taken from 04/2016 through 03/2019   06/06/2019 Genetic Testing   Negative genetic testing:  No pathogenic variants detected on the [facility] panel, ordered by Dr. Mat at [facility]. Two variants of uncertain significance (VUS) were detected - one in the BRIP1 gene called c.2863A>C and a second in the NBN gene called c.643C>T. The report date is 06/06/2019.  The [facility] gene panel offered by [employer] includes sequencing and deletion/duplication testing of the following 35 genes: APC, ATM, AXIN2, BARD1, BMPR1A, BRCA1, BRCA2, BRIP1, CDH1, CDK4, CDKN2A, CHEK2, EPCAM (large rearrangement only), HOXB13 (sequencing only), GALNT12, MLH1, MSH2, MSH3 (excluding repetitive portions of exon 1), MSH6, MUTYH, NBN, NTHL1, PALB2, PMS2, PTEN, RAD51C, RAD51D, RNF43, RPS20, SMAD4, STK11, and TP53. Sequencing was performed for select regions of POLE and POLD1, and large rearrangement analysis was performed for select regions of GREM1.     06/25/2020 Relapse/Recurrence   right breast lower outer quadrant biopsy 06/25/2020 shows a clinical T2 N1, stage IB  invasive ductal carcinoma, grade 2 or 3, triple positive, with an MIB-1 of 40%   07/02/2020 Cancer Staging   Staging form: Breast, AJCC 8th Edition - Clinical stage from 07/02/2020: Stage IV (cT2, cN1, cM1, G3, ER+, PR+, HER2+) - Signed by Layla Sandria BROCKS, MD on 08/04/2020 Stage prefix: Initial diagnosis Histologic grading system: 3 grade system Laterality: Right Staged by: Pathologist and managing physician Stage used in treatment planning: Yes National guidelines used in treatment planning: Yes Type of national guideline used in treatment planning: NCCN   07/16/2020 - 09/30/2020 Chemotherapy   neoadjuvant chemotherapy consisting of docetaxel , carboplatin , trastuzumab  and pertuzumab  every 21 days x 4 cycles started 07/28/2020, last chemotherapy 09/30/2020       08/01/2020 PET scan   PET scan 08/01/2020 shows no hypermetabolism in the 1.2 cm right liver lesion; bony hypermetabolism consistent with known mets   08/19/2020 - 10/21/2020 Chemotherapy   Patient is on Treatment Plan : BREAST  Trastuzumab  + Pertuzumab  q21d      10/07/2020 Imaging    total spinal MRI 10/07/2020 shows stable to improved disease, no extraosseous extension, pathologic fracture, epidural or intracanalicular involvement MRI of the breast 10/19/2020 shows a complete imaging response in the breast, with new ring-enhancing lesions in the left breast with MRI guided biopsy 11/10/2020 showing no evidence of malignancy   11/11/2020 - 01/18/2022 Chemotherapy   Patient is on Treatment Plan : BREAST Trastuzumab  + Pertuzumab  q21d     11/11/2020 - 04/12/2022 Chemotherapy   Patient is on Treatment Plan : BREAST Trastuzumab  IV (8/6) or SQ (600) D1 q21d     01/02/2021 Surgery   right lumpectomy with no sentinel lymph node sampling 01/02/2021 showed scattered foci of residual invasive ductal carcinoma, grade 2, the largest measuring 0.8 cm.  Margins were negative  01/02/2021 -  Anti-estrogen oral therapy   anastrozole  started  01/12/2021   05/11/2022 -  Chemotherapy   Patient is on Treatment Plan : BREAST MAINTENANCE Trastuzumab  IV (6) or SQ (600) D1 q28d x 13 cycles     Malignant neoplasm metastatic to bone (HCC)  08/04/2020 Initial Diagnosis   Bone metastases (HCC)   11/11/2020 - 01/18/2022 Chemotherapy   Patient is on Treatment Plan : BREAST Trastuzumab  + Pertuzumab  q21d     11/11/2020 - 04/12/2022 Chemotherapy   Patient is on Treatment Plan : BREAST Trastuzumab  IV (8/6) or SQ (600) D1 q21d       CHIEF COMPLIANT: Follow-up on Herceptin   HISTORY OF PRESENT ILLNESS:  History of Present Illness Kim Archer is a 58 year old female who presents for follow-up regarding her tendinitis and medication management.  She has discontinued meloxicam due to concerns about long-term effects, resulting in the return of arm pain associated with tendinitis. She has incorporated vitamins such as moringa, turmeric, and black seed oil into her regimen, with the hope that turmeric may aid in reducing inflammation. She has been using black seed oil for approximately one week.  She is concerned about her blood pressure, noting an initially very low diastolic reading that normalized to the sixties upon retesting. She recalls a previous blood work result showing a low anion gap, which she found concerning.     ALLERGIES:  is allergic to ciprofloxacin, ciprofibrate, gabapentin , sulfa antibiotics, and venlafaxine .  MEDICATIONS:  Current Outpatient Medications  Medication Sig Dispense Refill   Biotin  5 MG CAPS Take 1 capsule (5 mg total) by mouth daily.  0   BLACK CURRANT SEED OIL PO Take by mouth.     Boron 3 MG CAPS Take by mouth.     Calcium 200 MG TABS      Calcium 500 MG tablet Take 600 mg by mouth.     ipratropium (ATROVENT ) 0.06 % nasal spray PLACE 1 SPRAY INTO BOTH NOSTRILS 3 (THREE) TIMES DAILY. 45 mL 1   lidocaine -prilocaine  (EMLA ) cream Apply topically continuous as needed. 30 g 3   LUTEIN PO Take by mouth.      meloxicam (MOBIC) 7.5 MG tablet Take 7.5 mg by mouth daily. 1@ night     Menatetrenone (VITAMIN K2) 100 MCG TABS Take by mouth.     Moringa 500 MG CAPS Take 1,800 mg by mouth.     OVER THE COUNTER MEDICATION Take 1 capsule by mouth daily. [employer] Mushroom 1200mg      Pediatric Multivitamins-Fl (MULTIVITAMIN + FLUORIDE) 0.25 MG CHEW      Turmeric (QC TUMERIC COMPLEX) 500 MG CAPS Take by mouth.     Ubiquinol 50 MG CAPS Take by mouth daily.     valACYclovir  (VALTREX ) 500 MG tablet Take 500 mg by mouth 2 (two) times daily as needed.     VITAMIN D PO 50 mcg.     No current facility-administered medications for this visit.    PHYSICAL EXAMINATION: ECOG PERFORMANCE STATUS: 1 - Symptomatic but completely ambulatory  Vitals:   11/29/23 1505  BP: 133/63  Pulse: (!) 55  Resp: 16  Temp: 97.7 F (36.5 C)  SpO2: 100%   Filed Weights   11/29/23 1505  Weight: 179 lb (81.2 kg)    LABORATORY DATA:  I have reviewed the data as listed    Latest Ref Rng & Units 11/01/2023    2:47 PM 05/17/2023    2:31 PM 11/23/2022    3:10 PM  CMP  Glucose 70 - 99 mg/dL 81  887  894   BUN 6 - 20 mg/dL 15  16  17    Creatinine 0.44 - 1.00 mg/dL 9.19  9.15  9.00   Sodium 135 - 145 mmol/L 141  142  143   Potassium 3.5 - 5.1 mmol/L 4.2  4.0  4.1   Chloride 98 - 111 mmol/L 108  108  106   CO2 22 - 32 mmol/L 29  27  31    Calcium 8.9 - 10.3 mg/dL 9.5  9.6  89.6   Total Protein 6.5 - 8.1 g/dL 7.2  7.2  7.5   Total Bilirubin 0.0 - 1.2 mg/dL 0.3  0.4  0.4   Alkaline Phos 38 - 126 U/L 83  70  95   AST 15 - 41 U/L 20  22  19    ALT 0 - 44 U/L 27  37  20     Lab Results  Component Value Date   WBC 6.3 11/01/2023   HGB 13.5 11/01/2023   HCT 40.0 11/01/2023   MCV 91.7 11/01/2023   PLT 241 11/01/2023   NEUTROABS 3.0 11/01/2023    ASSESSMENT & PLAN:  Malignant neoplasm of lower-outer quadrant of right breast of female, estrogen receptor positive (HCC) Metastatic Breast cancer ER/PR and Her 2 Positive: S/p NAC  with TCHP and BCS   Current Treatment: Herceptin     Toxicities: No adverse effects to Herceptin    Brain: 06/08/2022: Negative Continue with Herceptin  maintenance monthly   Zometa  to every 6 months.   Patient has tried three different medications (Anastrozole , Letrozole , and Exemestane ) with side effects including weight gain and joint pain. Tamoxifen  and Faslodex were discussed as future options.   -Continue Herceptin  indefinitely (echo 04/11/2023: EF 65%): Every 4 weeks -Consider Tamoxifen  at a low dose once the pinched nerve is better. She is still dealing with this.   Breast cancer surveillance:  Mammogram 12/08/2022: Benign breast density category C Breast exam 04/19/2023: Benign PET/CT 04/12/2023: No avid tracer uptake for recurrent or metastatic disease.  Similar sclerotic lesions in thoracolumbar spine none tracer avid MRI lumbar spine 06/10/2023: Degenerative changes, disc bulge L4-L5, no metastatic disease MRI pelvis 09/23/2023: Benign  Echocardiogram will be every 6 months Plan to obtain PET CT scan February 2026 (orders will be placed closer to the appointment) ------------------------------------- Assessment and Plan Assessment & Plan Metastatic estrogen receptor positive breast cancer with bone metastases Clarified thermograms are not standard care; mammograms recommended. - Schedule PET scan post-January and February infusions. - Continue mammograms as standard screening.  Tendinitis of the arm Explained meloxicam once daily is generally safe; more frequent use affects kidneys. - Consider resuming meloxicam once daily. - Continue turmeric for inflammation.      No orders of the defined types were placed in this encounter.  The patient has a good understanding of the overall plan. she agrees with it. she will call with any problems that may develop before the next visit here.  I personally spent a total of 30 minutes in the care of the patient today including  preparing to see the patient, getting/reviewing separately obtained history, performing a medically appropriate exam/evaluation, counseling and educating, placing orders, referring and communicating with other health care professionals, documenting clinical information in the EHR, independently interpreting results, communicating results, and coordinating care.   Viinay K Terrelle Ruffolo, MD 11/29/23

## 2023-11-30 ENCOUNTER — Other Ambulatory Visit: Payer: Self-pay

## 2023-12-01 ENCOUNTER — Other Ambulatory Visit: Payer: Self-pay

## 2023-12-13 ENCOUNTER — Ambulatory Visit
Admission: RE | Admit: 2023-12-13 | Discharge: 2023-12-13 | Disposition: A | Discharge status: Home / Self Care | Source: Ambulatory Visit | Attending: Hematology and Oncology | Admitting: Hematology and Oncology

## 2023-12-13 DIAGNOSIS — Z9889 Other specified postprocedural states: Secondary | ICD-10-CM

## 2023-12-19 ENCOUNTER — Other Ambulatory Visit: Payer: Self-pay

## 2023-12-27 ENCOUNTER — Inpatient Hospital Stay: Attending: Hematology and Oncology

## 2023-12-27 VITALS — BP 124/58 | HR 64 | Temp 98.4°F | Resp 16 | Ht 67.0 in | Wt 183.5 lb

## 2023-12-27 DIAGNOSIS — Z79620 Long term (current) use of immunosuppressive biologic: Secondary | ICD-10-CM | POA: Diagnosis not present

## 2023-12-27 DIAGNOSIS — C50511 Malignant neoplasm of lower-outer quadrant of right female breast: Secondary | ICD-10-CM | POA: Diagnosis not present

## 2023-12-27 DIAGNOSIS — Z5112 Encounter for antineoplastic immunotherapy: Secondary | ICD-10-CM | POA: Diagnosis present

## 2023-12-27 DIAGNOSIS — C7951 Secondary malignant neoplasm of bone: Secondary | ICD-10-CM | POA: Insufficient documentation

## 2023-12-27 DIAGNOSIS — Z17410 Hormone receptor positive with human epidermal growth factor receptor 2 positive status: Secondary | ICD-10-CM | POA: Insufficient documentation

## 2023-12-27 MED ORDER — TRASTUZUMAB-ANNS CHEMO 150 MG IV SOLR
450.0000 mg | Freq: Once | INTRAVENOUS | Status: AC
  Administered 2023-12-27: 450 mg via INTRAVENOUS
  Filled 2023-12-27: qty 21.43

## 2023-12-27 MED ORDER — SODIUM CHLORIDE 0.9 % IV SOLN: Freq: Once | INTRAVENOUS | Status: AC

## 2023-12-27 MED ORDER — SODIUM CHLORIDE 0.9% FLUSH: 10.0000 mL | INTRAVENOUS | Status: DC | PRN

## 2023-12-27 NOTE — Patient Instructions (Signed)
 [facility] - A DEPT OF [facility]  Discharge Instructions: Thank you for choosing [facility] to provide your oncology and hematology care.   If you have a lab appointment with the Cancer Center, please go directly to the Cancer Center and check in at the registration area.   Wear comfortable clothing and clothing appropriate for easy access to any Portacath or PICC line.   We strive to give you quality time with your provider. You may need to reschedule your appointment if you arrive late (15 or more minutes).  Arriving late affects you and other patients whose appointments are after yours.  Also, if you miss three or more appointments without notifying the office, you may be dismissed from the clinic at the provider's discretion.      For prescription refill requests, have your pharmacy contact our office and allow 72 hours for refills to be completed.    Today you received the following chemotherapy and/or immunotherapy agents kanjinti      To help prevent nausea and vomiting after your treatment, we encourage you to take your nausea medication as directed.  BELOW ARE SYMPTOMS THAT SHOULD BE REPORTED IMMEDIATELY: *FEVER GREATER THAN 100.4 F (38 C) OR HIGHER *CHILLS OR SWEATING *NAUSEA AND VOMITING THAT IS NOT CONTROLLED WITH YOUR NAUSEA MEDICATION *UNUSUAL SHORTNESS OF BREATH *UNUSUAL BRUISING OR BLEEDING *URINARY PROBLEMS (pain or burning when urinating, or frequent urination) *BOWEL PROBLEMS (unusual diarrhea, constipation, pain near the anus) TENDERNESS IN MOUTH AND THROAT WITH OR WITHOUT PRESENCE OF ULCERS (sore throat, sores in mouth, or a toothache) UNUSUAL RASH, SWELLING OR PAIN  UNUSUAL VAGINAL DISCHARGE OR ITCHING   Items with * indicate a potential emergency and should be followed up as soon as possible or go to the Emergency Department if any problems should occur.  Please show the CHEMOTHERAPY ALERT CARD or IMMUNOTHERAPY  ALERT CARD at check-in to the Emergency Department and triage nurse.  Should you have questions after your visit or need to cancel or reschedule your appointment, please contact [facility] - A DEPT OF [facility]  Dept: [phone]  and follow the prompts.  Office hours are 8:00 a.m. to 4:30 p.m. Monday - Friday. Please note that voicemails left after 4:00 p.m. may not be returned until the following business day.  We are closed weekends and major holidays. You have access to a nurse at all times for urgent questions. Please call the main number to the clinic Dept: [phone] and follow the prompts.   For any non-urgent questions, you may also contact your provider using MyChart. We now offer e-Visits for anyone 81 and older to request care online for non-urgent symptoms. For details visit [email or web address].   Also download the MyChart app! Go to the app store, search "MyChart", open the app, select [facility], and log in with your MyChart username and password.

## 2024-01-05 ENCOUNTER — Encounter: Payer: Self-pay | Admitting: Hematology and Oncology

## 2024-01-05 ENCOUNTER — Other Ambulatory Visit: Payer: Self-pay | Admitting: Hematology and Oncology

## 2024-01-05 DIAGNOSIS — C50511 Malignant neoplasm of lower-outer quadrant of right female breast: Secondary | ICD-10-CM

## 2024-01-06 ENCOUNTER — Other Ambulatory Visit: Payer: Self-pay

## 2024-01-23 ENCOUNTER — Other Ambulatory Visit: Payer: Self-pay | Admitting: *Deleted

## 2024-01-23 ENCOUNTER — Inpatient Hospital Stay: Attending: Hematology and Oncology

## 2024-01-23 ENCOUNTER — Encounter: Payer: Self-pay | Admitting: Hematology and Oncology

## 2024-01-23 VITALS — BP 134/67 | HR 60 | Temp 98.1°F | Resp 16 | Wt 181.0 lb

## 2024-01-23 DIAGNOSIS — Z17410 Hormone receptor positive with human epidermal growth factor receptor 2 positive status: Secondary | ICD-10-CM | POA: Insufficient documentation

## 2024-01-23 DIAGNOSIS — Z5112 Encounter for antineoplastic immunotherapy: Secondary | ICD-10-CM | POA: Diagnosis present

## 2024-01-23 DIAGNOSIS — C50511 Malignant neoplasm of lower-outer quadrant of right female breast: Secondary | ICD-10-CM | POA: Diagnosis not present

## 2024-01-23 DIAGNOSIS — I427 Cardiomyopathy due to drug and external agent: Secondary | ICD-10-CM

## 2024-01-23 DIAGNOSIS — C7951 Secondary malignant neoplasm of bone: Secondary | ICD-10-CM | POA: Insufficient documentation

## 2024-01-23 DIAGNOSIS — Z79620 Long term (current) use of immunosuppressive biologic: Secondary | ICD-10-CM | POA: Diagnosis not present

## 2024-01-23 MED ORDER — SODIUM CHLORIDE 0.9 % IV SOLN: Freq: Once | INTRAVENOUS | Status: AC

## 2024-01-23 MED ORDER — TRASTUZUMAB-ANNS CHEMO 150 MG IV SOLR
450.0000 mg | Freq: Once | INTRAVENOUS | Status: AC
  Administered 2024-01-23: 450 mg via INTRAVENOUS
  Filled 2024-01-23: qty 21.43

## 2024-01-23 MED ORDER — SODIUM CHLORIDE 0.9% FLUSH: 10.0000 mL | INTRAVENOUS | Status: DC | PRN

## 2024-01-23 NOTE — Patient Instructions (Signed)
 [facility] - A DEPT OF [facility]  Discharge Instructions: Thank you for choosing [facility] to provide your oncology and hematology care.   If you have a lab appointment with the Cancer Center, please go directly to the Cancer Center and check in at the registration area.   Wear comfortable clothing and clothing appropriate for easy access to any Portacath or PICC line.   We strive to give you quality time with your provider. You may need to reschedule your appointment if you arrive late (15 or more minutes).  Arriving late affects you and other patients whose appointments are after yours.  Also, if you miss three or more appointments without notifying the office, you may be dismissed from the clinic at the provider's discretion.      For prescription refill requests, have your pharmacy contact our office and allow 72 hours for refills to be completed.    Today you received the following chemotherapy and/or immunotherapy agents kanjinti      To help prevent nausea and vomiting after your treatment, we encourage you to take your nausea medication as directed.  BELOW ARE SYMPTOMS THAT SHOULD BE REPORTED IMMEDIATELY: *FEVER GREATER THAN 100.4 F (38 C) OR HIGHER *CHILLS OR SWEATING *NAUSEA AND VOMITING THAT IS NOT CONTROLLED WITH YOUR NAUSEA MEDICATION *UNUSUAL SHORTNESS OF BREATH *UNUSUAL BRUISING OR BLEEDING *URINARY PROBLEMS (pain or burning when urinating, or frequent urination) *BOWEL PROBLEMS (unusual diarrhea, constipation, pain near the anus) TENDERNESS IN MOUTH AND THROAT WITH OR WITHOUT PRESENCE OF ULCERS (sore throat, sores in mouth, or a toothache) UNUSUAL RASH, SWELLING OR PAIN  UNUSUAL VAGINAL DISCHARGE OR ITCHING   Items with * indicate a potential emergency and should be followed up as soon as possible or go to the Emergency Department if any problems should occur.  Please show the CHEMOTHERAPY ALERT CARD or IMMUNOTHERAPY  ALERT CARD at check-in to the Emergency Department and triage nurse.  Should you have questions after your visit or need to cancel or reschedule your appointment, please contact [facility] - A DEPT OF [facility]  Dept: [phone]  and follow the prompts.  Office hours are 8:00 a.m. to 4:30 p.m. Monday - Friday. Please note that voicemails left after 4:00 p.m. may not be returned until the following business day.  We are closed weekends and major holidays. You have access to a nurse at all times for urgent questions. Please call the main number to the clinic Dept: [phone] and follow the prompts.   For any non-urgent questions, you may also contact your provider using MyChart. We now offer e-Visits for anyone 81 and older to request care online for non-urgent symptoms. For details visit [email or web address].   Also download the MyChart app! Go to the app store, search "MyChart", open the app, select [facility], and log in with your MyChart username and password.

## 2024-01-24 ENCOUNTER — Inpatient Hospital Stay: Admitting: Hematology and Oncology

## 2024-01-24 ENCOUNTER — Inpatient Hospital Stay

## 2024-02-08 ENCOUNTER — Ambulatory Visit (HOSPITAL_COMMUNITY)
Admission: RE | Admit: 2024-02-08 | Discharge: 2024-02-08 | Disposition: A | Discharge status: Home / Self Care | Source: Ambulatory Visit | Attending: Hematology and Oncology | Admitting: Hematology and Oncology

## 2024-02-08 DIAGNOSIS — C50511 Malignant neoplasm of lower-outer quadrant of right female breast: Secondary | ICD-10-CM | POA: Diagnosis not present

## 2024-02-08 DIAGNOSIS — I427 Cardiomyopathy due to drug and external agent: Secondary | ICD-10-CM

## 2024-02-08 DIAGNOSIS — Z170 Estrogen receptor positive status [ER+]: Secondary | ICD-10-CM | POA: Insufficient documentation

## 2024-02-08 DIAGNOSIS — Z7960 Long term (current) use of unspecified immunomodulators and immunosuppressants: Secondary | ICD-10-CM | POA: Insufficient documentation

## 2024-02-08 DIAGNOSIS — Z5181 Encounter for therapeutic drug level monitoring: Secondary | ICD-10-CM | POA: Diagnosis present

## 2024-02-08 NOTE — Progress Notes (Signed)
" °  Echocardiogram 2D Echocardiogram has been performed.  Devora Ellouise SAUNDERS 02/08/2024, 11:53 AM "

## 2024-02-09 LAB — ECHOCARDIOGRAM COMPLETE
Area-P 1/2: 3.42 cm2
Calc EF: 53.9 %
S' Lateral: 3.1 cm
Single Plane A2C EF: 49.6 %
Single Plane A4C EF: 55.5 %

## 2024-02-16 ENCOUNTER — Encounter: Payer: Self-pay | Admitting: Hematology and Oncology

## 2024-02-21 ENCOUNTER — Inpatient Hospital Stay: Attending: Hematology and Oncology | Admitting: Hematology and Oncology

## 2024-02-21 ENCOUNTER — Inpatient Hospital Stay

## 2024-02-21 VITALS — BP 127/69 | HR 58 | Temp 97.7°F | Resp 17 | Wt 183.9 lb

## 2024-02-21 VITALS — BP 146/85 | HR 51

## 2024-02-21 DIAGNOSIS — Z1731 Human epidermal growth factor receptor 2 positive status: Secondary | ICD-10-CM | POA: Diagnosis not present

## 2024-02-21 DIAGNOSIS — C7951 Secondary malignant neoplasm of bone: Secondary | ICD-10-CM | POA: Diagnosis not present

## 2024-02-21 DIAGNOSIS — Z5112 Encounter for antineoplastic immunotherapy: Secondary | ICD-10-CM | POA: Diagnosis present

## 2024-02-21 DIAGNOSIS — Z79620 Long term (current) use of immunosuppressive biologic: Secondary | ICD-10-CM | POA: Insufficient documentation

## 2024-02-21 DIAGNOSIS — Z170 Estrogen receptor positive status [ER+]: Secondary | ICD-10-CM | POA: Diagnosis not present

## 2024-02-21 DIAGNOSIS — C50511 Malignant neoplasm of lower-outer quadrant of right female breast: Secondary | ICD-10-CM | POA: Insufficient documentation

## 2024-02-21 MED ORDER — SODIUM CHLORIDE 0.9% FLUSH
10.0000 mL | INTRAVENOUS | Status: DC | PRN
  Administered 2024-02-21: 10 mL

## 2024-02-21 MED ORDER — SODIUM CHLORIDE 0.9 % IV SOLN: Freq: Once | INTRAVENOUS | Status: AC

## 2024-02-21 MED ORDER — ACETAMINOPHEN 325 MG PO TABS: 650.0000 mg | ORAL_TABLET | Freq: Once | ORAL | Status: DC

## 2024-02-21 MED ORDER — TRASTUZUMAB-ANNS CHEMO 150 MG IV SOLR
450.0000 mg | Freq: Once | INTRAVENOUS | Status: AC
  Administered 2024-02-21: 450 mg via INTRAVENOUS
  Filled 2024-02-21: qty 21.43

## 2024-02-21 NOTE — Assessment & Plan Note (Signed)
 Metastatic Breast cancer ER/PR and Her 2 Positive: S/p NAC with TCHP and BCS   Current Treatment: Herceptin     Toxicities: No adverse effects to Herceptin    Brain: 06/08/2022: Negative Continue with Herceptin  maintenance monthly   Zometa  to every 6 months.   Patient has tried three different medications (Anastrozole , Letrozole , and Exemestane ) with side effects including weight gain and joint pain. Tamoxifen  and Faslodex were discussed as future options.   -Continue Herceptin  indefinitely (echo 04/11/2023: EF 65%): Every 4 weeks -Consider Tamoxifen  at a low dose once the pinched nerve is better. She is still dealing with this.   Breast cancer surveillance:  Mammogram 12/13/2023: Benign breast density category C Breast exam 02/21/2024: Benign PET/CT 04/12/2023: No avid tracer uptake for recurrent or metastatic disease.  Similar sclerotic lesions in thoracolumbar spine none tracer avid MRI lumbar spine 06/10/2023: Degenerative changes, disc bulge L4-L5, no metastatic disease MRI pelvis 09/23/2023: Benign   Echocardiogram will be every 6 months Plan to obtain PET CT scan February 2026 (orders will be placed closer to the appointment)

## 2024-02-21 NOTE — Progress Notes (Signed)
 BP slightly elevated.  Discussed checking at home, pharmacy, or fire dept.& let her PCP know if cont to be elevated.  She is not on BP meds & this is not her usual.

## 2024-02-21 NOTE — Patient Instructions (Signed)

## 2024-02-21 NOTE — Progress Notes (Signed)
 "  Patient Care Team: [name] as PCP - General (Physician Assistant) Mat Browning, MD as Consulting Physician (Obstetrics and Gynecology) Curvin Deward MOULD, MD as Consulting Physician (General Surgery) Shannon Agent, MD as Consulting Physician (Radiation Oncology) Rolan Ezra RAMAN, MD as Consulting Physician (Cardiology) Odean Potts, MD as Medical Oncologist (Hematology and Oncology)  DIAGNOSIS:  Encounter Diagnosis  Name Primary?   Malignant neoplasm of lower-outer quadrant of right breast of female, estrogen receptor positive (HCC) Yes    SUMMARY OF ONCOLOGIC HISTORY: Oncology History  Malignant neoplasm of lower-outer quadrant of right breast of female, estrogen receptor positive (HCC)  03/22/2016 Surgery   left lumpectomy 03/22/2016 showing atypical lobular hyperplasia prophylactic tamoxifen  taken from 04/2016 through 03/2019   06/06/2019 Genetic Testing   Negative genetic testing:  No pathogenic variants detected on the [facility] panel, ordered by Dr. Mat at [facility]. Two variants of uncertain significance (VUS) were detected - one in the BRIP1 gene called c.2863A>C and a second in the NBN gene called c.643C>T. The report date is 06/06/2019.  The [facility] gene panel offered by [employer] includes sequencing and deletion/duplication testing of the following 35 genes: APC, ATM, AXIN2, BARD1, BMPR1A, BRCA1, BRCA2, BRIP1, CDH1, CDK4, CDKN2A, CHEK2, EPCAM (large rearrangement only), HOXB13 (sequencing only), GALNT12, MLH1, MSH2, MSH3 (excluding repetitive portions of exon 1), MSH6, MUTYH, NBN, NTHL1, PALB2, PMS2, PTEN, RAD51C, RAD51D, RNF43, RPS20, SMAD4, STK11, and TP53. Sequencing was performed for select regions of POLE and POLD1, and large rearrangement analysis was performed for select regions of GREM1.     06/25/2020 Relapse/Recurrence   right breast lower outer quadrant biopsy 06/25/2020 shows a clinical T2 N1, stage IB  invasive ductal carcinoma, grade 2 or 3, triple positive, with an MIB-1 of 40%   07/02/2020 Cancer Staging   Staging form: Breast, AJCC 8th Edition - Clinical stage from 07/02/2020: Stage IV (cT2, cN1, cM1, G3, ER+, PR+, HER2+) - Signed by Layla Sandria BROCKS, MD on 08/04/2020 Stage prefix: Initial diagnosis Histologic grading system: 3 grade system Laterality: Right Staged by: Pathologist and managing physician Stage used in treatment planning: Yes National guidelines used in treatment planning: Yes Type of national guideline used in treatment planning: NCCN   07/16/2020 - 09/30/2020 Chemotherapy   neoadjuvant chemotherapy consisting of docetaxel , carboplatin , trastuzumab  and pertuzumab  every 21 days x 4 cycles started 07/28/2020, last chemotherapy 09/30/2020       08/01/2020 PET scan   PET scan 08/01/2020 shows no hypermetabolism in the 1.2 cm right liver lesion; bony hypermetabolism consistent with known mets   08/19/2020 - 10/21/2020 Chemotherapy   Patient is on Treatment Plan : BREAST  Trastuzumab  + Pertuzumab  q21d      10/07/2020 Imaging    total spinal MRI 10/07/2020 shows stable to improved disease, no extraosseous extension, pathologic fracture, epidural or intracanalicular involvement MRI of the breast 10/19/2020 shows a complete imaging response in the breast, with new ring-enhancing lesions in the left breast with MRI guided biopsy 11/10/2020 showing no evidence of malignancy   11/11/2020 - 01/18/2022 Chemotherapy   Patient is on Treatment Plan : BREAST Trastuzumab  + Pertuzumab  q21d     11/11/2020 - 04/12/2022 Chemotherapy   Patient is on Treatment Plan : BREAST Trastuzumab  IV (8/6) or SQ (600) D1 q21d     01/02/2021 Surgery   right lumpectomy with no sentinel lymph node sampling 01/02/2021 showed scattered foci of residual invasive ductal carcinoma, grade 2, the largest measuring 0.8 cm.  Margins were  negative   01/02/2021 -  Anti-estrogen oral therapy   anastrozole  started  01/12/2021   05/11/2022 -  Chemotherapy   Patient is on Treatment Plan : BREAST MAINTENANCE Trastuzumab  IV (6) or SQ (600) D1 q28d x 13 cycles     Malignant neoplasm metastatic to bone (HCC)  08/04/2020 Initial Diagnosis   Bone metastases (HCC)   11/11/2020 - 01/18/2022 Chemotherapy   Patient is on Treatment Plan : BREAST Trastuzumab  + Pertuzumab  q21d     11/11/2020 - 04/12/2022 Chemotherapy   Patient is on Treatment Plan : BREAST Trastuzumab  IV (8/6) or SQ (600) D1 q21d       CHIEF COMPLIANT: Follow-up of metastatic breast cancer on Herceptin   HISTORY OF PRESENT ILLNESS: History of Present Illness Kim Archer is a 59 year old female with metastatic estrogen receptor positive, HER2 positive breast cancer in remission who presents for routine oncology follow-up and surveillance planning.  She has no new symptoms or concerns and no acute issues related to her malignancy or its treatment. Her energy is okay.  She continues maintenance trastuzumab , anastrozole , and Zometa  as scheduled.  She has an adequate supply of her topical cream.      ALLERGIES:  is allergic to ciprofloxacin, ciprofibrate, gabapentin , sulfa antibiotics, and venlafaxine .  MEDICATIONS:  Current Outpatient Medications  Medication Sig Dispense Refill   Biotin  5 MG CAPS Take 1 capsule (5 mg total) by mouth daily.  0   BLACK CURRANT SEED OIL PO Take by mouth.     Boron 3 MG CAPS Take by mouth.     Calcium 200 MG TABS      Calcium 500 MG tablet Take 600 mg by mouth.     ipratropium (ATROVENT ) 0.06 % nasal spray PLACE 1 SPRAY INTO BOTH NOSTRILS 3 (THREE) TIMES DAILY. 45 mL 1   lidocaine -prilocaine  (EMLA ) cream Apply topically continuous as needed. 30 g 3   LUTEIN PO Take by mouth.     meloxicam (MOBIC) 7.5 MG tablet Take 7.5 mg by mouth daily. 1@ night     Menatetrenone (VITAMIN K2) 100 MCG TABS Take by mouth.     Moringa 500 MG CAPS Take 1,800 mg by mouth.     OVER THE COUNTER MEDICATION Take 1 capsule by  mouth daily. [employer] Mushroom 1200mg      Pediatric Multivitamins-Fl (MULTIVITAMIN + FLUORIDE) 0.25 MG CHEW      Turmeric (QC TUMERIC COMPLEX) 500 MG CAPS Take by mouth.     Ubiquinol 50 MG CAPS Take by mouth daily.     valACYclovir (VALTREX) 500 MG tablet Take 500 mg by mouth 2 (two) times daily as needed.     VITAMIN D PO 50 mcg.     No current facility-administered medications for this visit.    PHYSICAL EXAMINATION: ECOG PERFORMANCE STATUS: 1 - Symptomatic but completely ambulatory  Vitals:   02/21/24 1458  BP: 127/69  Pulse: (!) 58  Resp: 17  Temp: 97.7 F (36.5 C)  SpO2: 98%   Filed Weights   02/21/24 1458  Weight: 183 lb 14.4 oz (83.4 kg)   LABORATORY DATA:  I have reviewed the data as listed    Latest Ref Rng & Units 11/01/2023    2:47 PM 05/17/2023    2:31 PM 11/23/2022    3:10 PM  CMP  Glucose 70 - 99 mg/dL 81  887  894   BUN 6 - 20 mg/dL 15  16  17    Creatinine 0.44 - 1.00 mg/dL 9.19  0.84  0.99   Sodium 135 - 145 mmol/L 141  142  143   Potassium 3.5 - 5.1 mmol/L 4.2  4.0  4.1   Chloride 98 - 111 mmol/L 108  108  106   CO2 22 - 32 mmol/L 29  27  31    Calcium 8.9 - 10.3 mg/dL 9.5  9.6  89.6   Total Protein 6.5 - 8.1 g/dL 7.2  7.2  7.5   Total Bilirubin 0.0 - 1.2 mg/dL 0.3  0.4  0.4   Alkaline Phos 38 - 126 U/L 83  70  95   AST 15 - 41 U/L 20  22  19    ALT 0 - 44 U/L 27  37  20     Lab Results  Component Value Date   WBC 6.3 11/01/2023   HGB 13.5 11/01/2023   HCT 40.0 11/01/2023   MCV 91.7 11/01/2023   PLT 241 11/01/2023   NEUTROABS 3.0 11/01/2023    ASSESSMENT & PLAN:  Malignant neoplasm of 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   Patient has  tried three different medications (Anastrozole , Letrozole , and Exemestane ) with side effects including weight gain and joint pain. Tamoxifen  and Faslodex were discussed as future options.   -Continue Herceptin  indefinitely (echo 04/11/2023: EF 65%): Every 4 weeks -Consider Tamoxifen  at a low dose once the pinched nerve is better. She is still dealing with this.   Breast cancer surveillance:  Mammogram 12/13/2023: Benign breast density category C Breast exam 02/21/2024: Benign PET/CT 04/12/2023: No avid tracer uptake for recurrent or metastatic disease.  Similar sclerotic lesions in thoracolumbar spine none tracer avid MRI lumbar spine 06/10/2023: Degenerative changes, disc bulge L4-L5, no metastatic disease MRI pelvis 09/23/2023: Benign   Echocardiogram will be every 6 months Plan to obtain PET CT scan March 2026   Patient does not need any follow-up with these visit.  I will see her once every 3 visits.  No orders of the defined types were placed in this encounter.  The patient has a good understanding of the overall plan. she agrees with it. she will call with any problems that may develop before the next visit here.  I personally spent a total of 30 minutes in the care of the patient today including preparing to see the patient, getting/reviewing separately obtained history, performing a medically appropriate exam/evaluation, counseling and educating, placing orders, referring and communicating with other health care professionals, documenting clinical information in the EHR, independently interpreting results, communicating results, and coordinating care.   Viinay K Alexandro Line, MD 02/21/2024    "

## 2024-03-07 ENCOUNTER — Other Ambulatory Visit: Payer: Self-pay

## 2024-03-20 ENCOUNTER — Inpatient Hospital Stay: Admitting: Hematology and Oncology

## 2024-03-20 ENCOUNTER — Inpatient Hospital Stay: Attending: Hematology and Oncology

## 2024-03-20 VITALS — BP 125/76 | HR 60 | Temp 98.2°F | Resp 14 | Wt 185.5 lb

## 2024-03-20 DIAGNOSIS — C50511 Malignant neoplasm of lower-outer quadrant of right female breast: Secondary | ICD-10-CM

## 2024-03-20 MED ORDER — TRASTUZUMAB-ANNS CHEMO 150 MG IV SOLR
450.0000 mg | Freq: Once | INTRAVENOUS | Status: AC
  Administered 2024-03-20: 450 mg via INTRAVENOUS
  Filled 2024-03-20: qty 21.4

## 2024-03-20 MED ORDER — ACETAMINOPHEN 325 MG PO TABS: 650.0000 mg | ORAL_TABLET | Freq: Once | ORAL | Status: DC

## 2024-03-20 MED ORDER — SODIUM CHLORIDE 0.9 % IV SOLN: Freq: Once | INTRAVENOUS | Status: AC

## 2024-03-20 NOTE — Patient Instructions (Signed)

## 2024-04-17 ENCOUNTER — Inpatient Hospital Stay: Admitting: Hematology and Oncology

## 2024-04-17 ENCOUNTER — Inpatient Hospital Stay
